# Patient Record
Sex: Male | Born: 1940 | Race: White | Hispanic: No | Marital: Married | Smoking: Former smoker
Health system: Southern US, Community
[De-identification: ages and names within clinical notes are randomized; demographics above are authoritative.]

## PROBLEM LIST (undated history)

## (undated) DIAGNOSIS — I10 Essential (primary) hypertension: Secondary | ICD-10-CM

## (undated) DIAGNOSIS — R0609 Other forms of dyspnea: Secondary | ICD-10-CM

## (undated) DIAGNOSIS — M8580 Other specified disorders of bone density and structure, unspecified site: Secondary | ICD-10-CM

## (undated) DIAGNOSIS — E039 Hypothyroidism, unspecified: Secondary | ICD-10-CM

## (undated) DIAGNOSIS — H409 Unspecified glaucoma: Secondary | ICD-10-CM

## (undated) DIAGNOSIS — M109 Gout, unspecified: Secondary | ICD-10-CM

## (undated) DIAGNOSIS — R0600 Dyspnea, unspecified: Secondary | ICD-10-CM

## (undated) DIAGNOSIS — K219 Gastro-esophageal reflux disease without esophagitis: Secondary | ICD-10-CM

## (undated) DIAGNOSIS — F32A Depression, unspecified: Secondary | ICD-10-CM

## (undated) DIAGNOSIS — N529 Male erectile dysfunction, unspecified: Secondary | ICD-10-CM

## (undated) DIAGNOSIS — J439 Emphysema, unspecified: Secondary | ICD-10-CM

## (undated) DIAGNOSIS — I440 Atrioventricular block, first degree: Secondary | ICD-10-CM

## (undated) DIAGNOSIS — H3530 Unspecified macular degeneration: Secondary | ICD-10-CM

## (undated) DIAGNOSIS — F419 Anxiety disorder, unspecified: Secondary | ICD-10-CM

## (undated) DIAGNOSIS — G629 Polyneuropathy, unspecified: Secondary | ICD-10-CM

## (undated) DIAGNOSIS — G2581 Restless legs syndrome: Secondary | ICD-10-CM

## (undated) DIAGNOSIS — N62 Hypertrophy of breast: Secondary | ICD-10-CM

## (undated) DIAGNOSIS — I2510 Atherosclerotic heart disease of native coronary artery without angina pectoris: Secondary | ICD-10-CM

## (undated) DIAGNOSIS — D696 Thrombocytopenia, unspecified: Secondary | ICD-10-CM

## (undated) DIAGNOSIS — F329 Major depressive disorder, single episode, unspecified: Secondary | ICD-10-CM

## (undated) DIAGNOSIS — F1011 Alcohol abuse, in remission: Secondary | ICD-10-CM

## (undated) DIAGNOSIS — J449 Chronic obstructive pulmonary disease, unspecified: Secondary | ICD-10-CM

## (undated) DIAGNOSIS — E785 Hyperlipidemia, unspecified: Secondary | ICD-10-CM

## (undated) DIAGNOSIS — G4733 Obstructive sleep apnea (adult) (pediatric): Secondary | ICD-10-CM

## (undated) DIAGNOSIS — N400 Enlarged prostate without lower urinary tract symptoms: Secondary | ICD-10-CM

## (undated) HISTORY — DX: Dyspnea, unspecified: R06.00

## (undated) HISTORY — DX: Atrioventricular block, first degree: I44.0

## (undated) HISTORY — DX: Restless legs syndrome: G25.81

## (undated) HISTORY — DX: Benign prostatic hyperplasia without lower urinary tract symptoms: N40.0

## (undated) HISTORY — DX: Hyperlipidemia, unspecified: E78.5

## (undated) HISTORY — DX: Atherosclerotic heart disease of native coronary artery without angina pectoris: I25.10

## (undated) HISTORY — DX: Alcohol abuse, in remission: F10.11

## (undated) HISTORY — PX: ORIF FEMUR FRACTURE: SHX2119

## (undated) HISTORY — DX: Major depressive disorder, single episode, unspecified: F32.9

## (undated) HISTORY — DX: Gastro-esophageal reflux disease without esophagitis: K21.9

## (undated) HISTORY — DX: Hypertrophy of breast: N62

## (undated) HISTORY — DX: Thrombocytopenia, unspecified: D69.6

## (undated) HISTORY — DX: Unspecified macular degeneration: H35.30

## (undated) HISTORY — DX: Hypothyroidism, unspecified: E03.9

## (undated) HISTORY — DX: Essential (primary) hypertension: I10

## (undated) HISTORY — DX: Chronic obstructive pulmonary disease, unspecified: J44.9

## (undated) HISTORY — DX: Male erectile dysfunction, unspecified: N52.9

## (undated) HISTORY — DX: Obstructive sleep apnea (adult) (pediatric): G47.33

## (undated) HISTORY — DX: Polyneuropathy, unspecified: G62.9

## (undated) HISTORY — DX: Emphysema, unspecified: J43.9

## (undated) HISTORY — DX: Depression, unspecified: F32.A

## (undated) HISTORY — DX: Unspecified glaucoma: H40.9

## (undated) HISTORY — DX: Other forms of dyspnea: R06.09

## (undated) HISTORY — DX: Hemochromatosis, unspecified: E83.119

## (undated) HISTORY — DX: Anxiety disorder, unspecified: F41.9

## (undated) HISTORY — DX: Other specified disorders of bone density and structure, unspecified site: M85.80

## (undated) HISTORY — DX: Gout, unspecified: M10.9

---

## 1998-01-15 ENCOUNTER — Encounter: Payer: Self-pay | Admitting: Cardiology

## 1998-01-15 ENCOUNTER — Ambulatory Visit (HOSPITAL_COMMUNITY): Admission: RE | Admit: 1998-01-15 | Discharge: 1998-01-15 | Payer: Self-pay | Admitting: Cardiology

## 1998-01-26 ENCOUNTER — Ambulatory Visit (HOSPITAL_COMMUNITY): Admission: RE | Admit: 1998-01-26 | Discharge: 1998-01-26 | Payer: Self-pay | Admitting: Cardiology

## 2000-01-17 ENCOUNTER — Ambulatory Visit (HOSPITAL_COMMUNITY): Admission: RE | Admit: 2000-01-17 | Discharge: 2000-01-17 | Payer: Self-pay | Admitting: Gastroenterology

## 2001-01-04 ENCOUNTER — Encounter (INDEPENDENT_AMBULATORY_CARE_PROVIDER_SITE_OTHER): Payer: Self-pay | Admitting: *Deleted

## 2001-01-04 ENCOUNTER — Ambulatory Visit (HOSPITAL_BASED_OUTPATIENT_CLINIC_OR_DEPARTMENT_OTHER): Admission: RE | Admit: 2001-01-04 | Discharge: 2001-01-04 | Payer: Self-pay | Admitting: Orthopedic Surgery

## 2001-02-26 ENCOUNTER — Ambulatory Visit (HOSPITAL_COMMUNITY): Admission: RE | Admit: 2001-02-26 | Discharge: 2001-02-26 | Payer: Self-pay | Admitting: Gastroenterology

## 2001-02-26 ENCOUNTER — Encounter (INDEPENDENT_AMBULATORY_CARE_PROVIDER_SITE_OTHER): Payer: Self-pay | Admitting: Specialist

## 2002-02-03 ENCOUNTER — Ambulatory Visit (HOSPITAL_COMMUNITY): Admission: RE | Admit: 2002-02-03 | Discharge: 2002-02-03 | Payer: Self-pay | Admitting: Gastroenterology

## 2002-02-03 ENCOUNTER — Encounter: Payer: Self-pay | Admitting: Gastroenterology

## 2002-02-28 ENCOUNTER — Encounter: Payer: Self-pay | Admitting: Emergency Medicine

## 2002-02-28 ENCOUNTER — Emergency Department (HOSPITAL_COMMUNITY): Admission: EM | Admit: 2002-02-28 | Discharge: 2002-02-28 | Payer: Self-pay | Admitting: Emergency Medicine

## 2002-04-14 ENCOUNTER — Emergency Department (HOSPITAL_COMMUNITY): Admission: EM | Admit: 2002-04-14 | Discharge: 2002-04-14 | Payer: Self-pay | Admitting: Emergency Medicine

## 2002-04-14 ENCOUNTER — Encounter: Payer: Self-pay | Admitting: Emergency Medicine

## 2005-09-27 ENCOUNTER — Encounter: Payer: Self-pay | Admitting: Cardiology

## 2006-05-30 ENCOUNTER — Emergency Department (HOSPITAL_COMMUNITY): Admission: EM | Admit: 2006-05-30 | Discharge: 2006-05-30 | Payer: Self-pay | Admitting: Emergency Medicine

## 2006-05-30 ENCOUNTER — Ambulatory Visit: Payer: Self-pay | Admitting: Internal Medicine

## 2006-06-05 ENCOUNTER — Encounter: Payer: Self-pay | Admitting: Cardiology

## 2006-06-05 ENCOUNTER — Ambulatory Visit: Payer: Self-pay

## 2007-07-10 ENCOUNTER — Encounter: Admission: RE | Admit: 2007-07-10 | Discharge: 2007-07-10 | Payer: Self-pay | Admitting: Endocrinology

## 2007-07-26 ENCOUNTER — Encounter (INDEPENDENT_AMBULATORY_CARE_PROVIDER_SITE_OTHER): Payer: Self-pay | Admitting: Otolaryngology

## 2007-07-26 ENCOUNTER — Ambulatory Visit (HOSPITAL_COMMUNITY): Admission: RE | Admit: 2007-07-26 | Discharge: 2007-07-26 | Payer: Self-pay | Admitting: Otolaryngology

## 2008-02-13 ENCOUNTER — Encounter (INDEPENDENT_AMBULATORY_CARE_PROVIDER_SITE_OTHER): Payer: Self-pay | Admitting: Otolaryngology

## 2008-02-13 ENCOUNTER — Ambulatory Visit (HOSPITAL_COMMUNITY): Admission: RE | Admit: 2008-02-13 | Discharge: 2008-02-13 | Payer: Self-pay | Admitting: Otolaryngology

## 2008-03-27 HISTORY — PX: CARDIAC CATHETERIZATION: SHX172

## 2008-07-18 ENCOUNTER — Emergency Department (HOSPITAL_COMMUNITY): Admission: EM | Admit: 2008-07-18 | Discharge: 2008-07-18 | Payer: Self-pay | Admitting: Emergency Medicine

## 2008-08-31 ENCOUNTER — Emergency Department (HOSPITAL_COMMUNITY): Admission: EM | Admit: 2008-08-31 | Discharge: 2008-08-31 | Payer: Self-pay | Admitting: Emergency Medicine

## 2009-01-27 ENCOUNTER — Ambulatory Visit: Payer: Self-pay | Admitting: Oncology

## 2009-01-29 LAB — CHCC SMEAR

## 2009-01-29 LAB — CBC WITH DIFFERENTIAL/PLATELET
BASO%: 0.5 % (ref 0.0–2.0)
Basophils Absolute: 0 10*3/uL (ref 0.0–0.1)
EOS%: 1.2 % (ref 0.0–7.0)
Eosinophils Absolute: 0.1 10*3/uL (ref 0.0–0.5)
HCT: 44.2 % (ref 38.4–49.9)
HGB: 15.5 g/dL (ref 13.0–17.1)
LYMPH%: 17.4 % (ref 14.0–49.0)
MCH: 34.8 pg — ABNORMAL HIGH (ref 27.2–33.4)
MCHC: 35 g/dL (ref 32.0–36.0)
MCV: 99.2 fL — ABNORMAL HIGH (ref 79.3–98.0)
MONO#: 0.3 10*3/uL (ref 0.1–0.9)
MONO%: 5.1 % (ref 0.0–14.0)
NEUT#: 3.7 10*3/uL (ref 1.5–6.5)
NEUT%: 75.8 % — ABNORMAL HIGH (ref 39.0–75.0)
Platelets: 84 10*3/uL — ABNORMAL LOW (ref 140–400)
RBC: 4.46 10*6/uL (ref 4.20–5.82)
RDW: 14.4 % (ref 11.0–14.6)
WBC: 4.9 10*3/uL (ref 4.0–10.3)
lymph#: 0.9 10*3/uL (ref 0.9–3.3)

## 2009-02-03 LAB — RHEUMATOID FACTOR: Rhuematoid fact SerPl-aCnc: 79 IU/mL — ABNORMAL HIGH (ref 0–20)

## 2009-02-03 LAB — APTT: aPTT: 26 seconds (ref 24–37)

## 2009-02-03 LAB — COMPREHENSIVE METABOLIC PANEL
ALT: 25 U/L (ref 0–53)
AST: 34 U/L (ref 0–37)
Albumin: 4.4 g/dL (ref 3.5–5.2)
Alkaline Phosphatase: 115 U/L (ref 39–117)
BUN: 23 mg/dL (ref 6–23)
CO2: 21 mEq/L (ref 19–32)
Calcium: 9.3 mg/dL (ref 8.4–10.5)
Chloride: 104 mEq/L (ref 96–112)
Creatinine, Ser: 1.36 mg/dL (ref 0.40–1.50)
Glucose, Bld: 105 mg/dL — ABNORMAL HIGH (ref 70–99)
Potassium: 4 mEq/L (ref 3.5–5.3)
Sodium: 139 mEq/L (ref 135–145)
Total Bilirubin: 1.5 mg/dL — ABNORMAL HIGH (ref 0.3–1.2)
Total Protein: 7.1 g/dL (ref 6.0–8.3)

## 2009-02-03 LAB — ANA: Anti Nuclear Antibody(ANA): NEGATIVE

## 2009-02-03 LAB — PROTHROMBIN TIME
INR: 1.1 (ref 0.0–1.5)
Prothrombin Time: 14.2 seconds (ref 11.6–15.2)

## 2009-02-03 LAB — LACTATE DEHYDROGENASE: LDH: 141 U/L (ref 94–250)

## 2009-02-10 ENCOUNTER — Ambulatory Visit (HOSPITAL_COMMUNITY): Admission: RE | Admit: 2009-02-10 | Discharge: 2009-02-10 | Payer: Self-pay | Admitting: Oncology

## 2009-03-02 ENCOUNTER — Ambulatory Visit: Payer: Self-pay | Admitting: Oncology

## 2009-03-03 LAB — CBC WITH DIFFERENTIAL/PLATELET
BASO%: 0.3 % (ref 0.0–2.0)
Basophils Absolute: 0 10*3/uL (ref 0.0–0.1)
EOS%: 1 % (ref 0.0–7.0)
Eosinophils Absolute: 0.1 10*3/uL (ref 0.0–0.5)
HCT: 43.3 % (ref 38.4–49.9)
HGB: 15.2 g/dL (ref 13.0–17.1)
LYMPH%: 22.5 % (ref 14.0–49.0)
MCH: 35.3 pg — ABNORMAL HIGH (ref 27.2–33.4)
MCHC: 35.1 g/dL (ref 32.0–36.0)
MCV: 100.4 fL — ABNORMAL HIGH (ref 79.3–98.0)
MONO#: 0.5 10*3/uL (ref 0.1–0.9)
MONO%: 8.2 % (ref 0.0–14.0)
NEUT#: 4 10*3/uL (ref 1.5–6.5)
NEUT%: 68 % (ref 39.0–75.0)
Platelets: 79 10*3/uL — ABNORMAL LOW (ref 140–400)
RBC: 4.32 10*6/uL (ref 4.20–5.82)
RDW: 13.8 % (ref 11.0–14.6)
WBC: 5.9 10*3/uL (ref 4.0–10.3)
lymph#: 1.3 10*3/uL (ref 0.9–3.3)

## 2009-03-31 ENCOUNTER — Emergency Department (HOSPITAL_COMMUNITY): Admission: EM | Admit: 2009-03-31 | Discharge: 2009-03-31 | Payer: Self-pay | Admitting: Emergency Medicine

## 2009-04-02 ENCOUNTER — Encounter: Payer: Self-pay | Admitting: Emergency Medicine

## 2009-04-02 ENCOUNTER — Inpatient Hospital Stay (HOSPITAL_COMMUNITY): Admission: EM | Admit: 2009-04-02 | Discharge: 2009-04-05 | Payer: Self-pay | Admitting: Psychiatry

## 2009-04-02 ENCOUNTER — Ambulatory Visit: Payer: Self-pay | Admitting: Psychiatry

## 2009-04-27 ENCOUNTER — Ambulatory Visit: Payer: Self-pay | Admitting: Oncology

## 2009-04-29 ENCOUNTER — Ambulatory Visit (HOSPITAL_COMMUNITY): Admission: RE | Admit: 2009-04-29 | Discharge: 2009-04-29 | Payer: Self-pay | Admitting: Gastroenterology

## 2009-06-02 ENCOUNTER — Ambulatory Visit: Payer: Self-pay | Admitting: Oncology

## 2009-06-04 LAB — CBC WITH DIFFERENTIAL/PLATELET
BASO%: 0.5 % (ref 0.0–2.0)
Basophils Absolute: 0 10*3/uL (ref 0.0–0.1)
EOS%: 1 % (ref 0.0–7.0)
Eosinophils Absolute: 0.1 10*3/uL (ref 0.0–0.5)
HCT: 42.1 % (ref 38.4–49.9)
HGB: 14.6 g/dL (ref 13.0–17.1)
LYMPH%: 17.6 % (ref 14.0–49.0)
MCH: 34.4 pg — ABNORMAL HIGH (ref 27.2–33.4)
MCHC: 34.7 g/dL (ref 32.0–36.0)
MCV: 99.1 fL — ABNORMAL HIGH (ref 79.3–98.0)
MONO#: 0.4 10*3/uL (ref 0.1–0.9)
MONO%: 6.1 % (ref 0.0–14.0)
NEUT#: 5.1 10*3/uL (ref 1.5–6.5)
NEUT%: 74.8 % (ref 39.0–75.0)
Platelets: 102 10*3/uL — ABNORMAL LOW (ref 140–400)
RBC: 4.25 10*6/uL (ref 4.20–5.82)
RDW: 13.7 % (ref 11.0–14.6)
WBC: 6.8 10*3/uL (ref 4.0–10.3)
lymph#: 1.2 10*3/uL (ref 0.9–3.3)

## 2009-06-07 ENCOUNTER — Ambulatory Visit: Payer: Self-pay | Admitting: Psychology

## 2009-06-07 LAB — COMPREHENSIVE METABOLIC PANEL
ALT: 18 U/L (ref 0–53)
AST: 25 U/L (ref 0–37)
Albumin: 4.2 g/dL (ref 3.5–5.2)
Alkaline Phosphatase: 96 U/L (ref 39–117)
BUN: 14 mg/dL (ref 6–23)
CO2: 25 mEq/L (ref 19–32)
Calcium: 9.1 mg/dL (ref 8.4–10.5)
Chloride: 101 mEq/L (ref 96–112)
Creatinine, Ser: 1.21 mg/dL (ref 0.40–1.50)
Glucose, Bld: 70 mg/dL (ref 70–99)
Potassium: 3.8 mEq/L (ref 3.5–5.3)
Sodium: 138 mEq/L (ref 135–145)
Total Bilirubin: 1.4 mg/dL — ABNORMAL HIGH (ref 0.3–1.2)
Total Protein: 6.5 g/dL (ref 6.0–8.3)

## 2009-06-07 LAB — LACTATE DEHYDROGENASE: LDH: 134 U/L (ref 94–250)

## 2009-06-07 LAB — VITAMIN B12: Vitamin B-12: 412 pg/mL (ref 211–911)

## 2009-06-07 LAB — FOLATE RBC: RBC Folate: 1886 ng/mL — ABNORMAL HIGH (ref 180–600)

## 2009-06-21 ENCOUNTER — Ambulatory Visit: Payer: Self-pay | Admitting: Psychology

## 2009-06-24 LAB — CBC WITH DIFFERENTIAL/PLATELET
BASO%: 0.5 % (ref 0.0–2.0)
Basophils Absolute: 0 10*3/uL (ref 0.0–0.1)
EOS%: 0.7 % (ref 0.0–7.0)
Eosinophils Absolute: 0.1 10*3/uL (ref 0.0–0.5)
HCT: 44.2 % (ref 38.4–49.9)
HGB: 15.4 g/dL (ref 13.0–17.1)
LYMPH%: 23.6 % (ref 14.0–49.0)
MCH: 34.3 pg — ABNORMAL HIGH (ref 27.2–33.4)
MCHC: 34.8 g/dL (ref 32.0–36.0)
MCV: 98.8 fL — ABNORMAL HIGH (ref 79.3–98.0)
MONO#: 0.5 10*3/uL (ref 0.1–0.9)
MONO%: 6.8 % (ref 0.0–14.0)
NEUT#: 5.1 10*3/uL (ref 1.5–6.5)
NEUT%: 68.4 % (ref 39.0–75.0)
Platelets: 100 10*3/uL — ABNORMAL LOW (ref 140–400)
RBC: 4.47 10*6/uL (ref 4.20–5.82)
RDW: 14.3 % (ref 11.0–14.6)
WBC: 7.5 10*3/uL (ref 4.0–10.3)
lymph#: 1.8 10*3/uL (ref 0.9–3.3)

## 2009-06-28 ENCOUNTER — Ambulatory Visit: Payer: Self-pay | Admitting: Psychology

## 2009-07-09 ENCOUNTER — Ambulatory Visit: Payer: Self-pay | Admitting: Psychology

## 2009-07-26 ENCOUNTER — Ambulatory Visit: Payer: Self-pay | Admitting: Psychology

## 2009-08-02 ENCOUNTER — Ambulatory Visit: Payer: Self-pay | Admitting: Psychology

## 2009-08-23 ENCOUNTER — Ambulatory Visit: Payer: Self-pay | Admitting: Psychology

## 2009-08-30 ENCOUNTER — Ambulatory Visit: Payer: Self-pay | Admitting: Psychology

## 2009-09-06 ENCOUNTER — Ambulatory Visit: Payer: Self-pay | Admitting: Psychology

## 2009-10-04 ENCOUNTER — Ambulatory Visit: Payer: Self-pay | Admitting: Psychology

## 2009-10-11 ENCOUNTER — Ambulatory Visit: Payer: Self-pay | Admitting: Psychology

## 2009-10-13 ENCOUNTER — Ambulatory Visit (HOSPITAL_COMMUNITY): Admission: RE | Admit: 2009-10-13 | Discharge: 2009-10-13 | Payer: Self-pay | Admitting: Psychiatry

## 2009-10-18 ENCOUNTER — Ambulatory Visit: Payer: Self-pay | Admitting: Psychology

## 2009-10-26 ENCOUNTER — Ambulatory Visit: Payer: Self-pay | Admitting: Psychology

## 2009-11-01 ENCOUNTER — Ambulatory Visit: Payer: Self-pay | Admitting: Psychology

## 2009-11-08 ENCOUNTER — Ambulatory Visit: Payer: Self-pay | Admitting: Psychology

## 2009-11-15 ENCOUNTER — Ambulatory Visit: Payer: Self-pay | Admitting: Psychology

## 2009-11-30 ENCOUNTER — Ambulatory Visit: Payer: Self-pay | Admitting: Oncology

## 2009-11-30 ENCOUNTER — Ambulatory Visit: Payer: Self-pay | Admitting: Psychology

## 2009-12-06 LAB — CBC WITH DIFFERENTIAL/PLATELET
BASO%: 0.1 % (ref 0.0–2.0)
Basophils Absolute: 0 10*3/uL (ref 0.0–0.1)
EOS%: 1.2 % (ref 0.0–7.0)
Eosinophils Absolute: 0.1 10*3/uL (ref 0.0–0.5)
HCT: 42 % (ref 38.4–49.9)
HGB: 14.4 g/dL (ref 13.0–17.1)
LYMPH%: 20.7 % (ref 14.0–49.0)
MCH: 32.8 pg (ref 27.2–33.4)
MCHC: 34.4 g/dL (ref 32.0–36.0)
MCV: 95.4 fL (ref 79.3–98.0)
MONO#: 0.3 10*3/uL (ref 0.1–0.9)
MONO%: 4.1 % (ref 0.0–14.0)
NEUT#: 6 10*3/uL (ref 1.5–6.5)
NEUT%: 73.9 % (ref 39.0–75.0)
Platelets: 133 10*3/uL — ABNORMAL LOW (ref 140–400)
RBC: 4.4 10*6/uL (ref 4.20–5.82)
RDW: 12.8 % (ref 11.0–14.6)
WBC: 8.2 10*3/uL (ref 4.0–10.3)
lymph#: 1.7 10*3/uL (ref 0.9–3.3)

## 2009-12-06 LAB — COMPREHENSIVE METABOLIC PANEL
ALT: 9 U/L (ref 0–53)
AST: 17 U/L (ref 0–37)
Albumin: 4.5 g/dL (ref 3.5–5.2)
Alkaline Phosphatase: 87 U/L (ref 39–117)
BUN: 23 mg/dL (ref 6–23)
CO2: 20 mEq/L (ref 19–32)
Calcium: 9.8 mg/dL (ref 8.4–10.5)
Chloride: 103 mEq/L (ref 96–112)
Creatinine, Ser: 1.33 mg/dL (ref 0.40–1.50)
Glucose, Bld: 90 mg/dL (ref 70–99)
Potassium: 4.5 mEq/L (ref 3.5–5.3)
Sodium: 137 mEq/L (ref 135–145)
Total Bilirubin: 0.9 mg/dL (ref 0.3–1.2)
Total Protein: 7.4 g/dL (ref 6.0–8.3)

## 2009-12-06 LAB — LACTATE DEHYDROGENASE: LDH: 112 U/L (ref 94–250)

## 2009-12-13 ENCOUNTER — Ambulatory Visit: Payer: Self-pay | Admitting: Psychology

## 2009-12-20 ENCOUNTER — Ambulatory Visit: Payer: Self-pay | Admitting: Psychology

## 2009-12-27 ENCOUNTER — Ambulatory Visit: Payer: Self-pay | Admitting: Psychology

## 2010-01-03 ENCOUNTER — Ambulatory Visit: Payer: Self-pay | Admitting: Psychology

## 2010-01-17 ENCOUNTER — Ambulatory Visit: Payer: Self-pay | Admitting: Psychology

## 2010-01-24 ENCOUNTER — Ambulatory Visit: Payer: Self-pay | Admitting: Psychology

## 2010-02-01 ENCOUNTER — Ambulatory Visit: Payer: Self-pay | Admitting: Psychology

## 2010-02-07 ENCOUNTER — Ambulatory Visit: Payer: Self-pay | Admitting: Psychology

## 2010-02-14 ENCOUNTER — Ambulatory Visit: Payer: Self-pay | Admitting: Psychology

## 2010-02-21 ENCOUNTER — Ambulatory Visit: Payer: Self-pay | Admitting: Psychology

## 2010-02-28 ENCOUNTER — Ambulatory Visit: Payer: Self-pay | Admitting: Psychology

## 2010-03-14 ENCOUNTER — Ambulatory Visit: Payer: Self-pay | Admitting: Psychology

## 2010-03-21 ENCOUNTER — Ambulatory Visit: Payer: Self-pay | Admitting: Psychology

## 2010-04-04 ENCOUNTER — Ambulatory Visit: Payer: Self-pay | Admitting: Psychology

## 2010-04-18 ENCOUNTER — Ambulatory Visit: Payer: Self-pay | Admitting: Psychology

## 2010-05-02 ENCOUNTER — Ambulatory Visit: Payer: Self-pay | Admitting: Psychology

## 2010-05-16 ENCOUNTER — Ambulatory Visit: Payer: Self-pay | Admitting: Psychology

## 2010-05-31 ENCOUNTER — Ambulatory Visit
Admission: RE | Admit: 2010-05-31 | Discharge: 2010-05-31 | Payer: Self-pay | Source: Home / Self Care | Attending: Psychology | Admitting: Psychology

## 2010-06-02 ENCOUNTER — Encounter
Admission: RE | Admit: 2010-06-02 | Discharge: 2010-06-02 | Payer: Self-pay | Source: Home / Self Care | Attending: Cardiovascular Disease | Admitting: Cardiovascular Disease

## 2010-06-03 ENCOUNTER — Ambulatory Visit (HOSPITAL_COMMUNITY)
Admission: RE | Admit: 2010-06-03 | Discharge: 2010-06-03 | Payer: Self-pay | Source: Home / Self Care | Attending: Cardiovascular Disease | Admitting: Cardiovascular Disease

## 2010-06-03 HISTORY — PX: CARDIAC CATHETERIZATION: SHX172

## 2010-06-03 LAB — BASIC METABOLIC PANEL
BUN: 32 mg/dL — ABNORMAL HIGH (ref 6–23)
CO2: 25 mEq/L (ref 19–32)
Calcium: 9.4 mg/dL (ref 8.4–10.5)
Chloride: 106 mEq/L (ref 96–112)
Creatinine, Ser: 1.8 mg/dL — ABNORMAL HIGH (ref 0.4–1.5)
GFR calc Af Amer: 45 mL/min — ABNORMAL LOW (ref >60)
GFR calc non Af Amer: 37 mL/min — ABNORMAL LOW (ref >60)
Glucose, Bld: 88 mg/dL (ref 70–99)
Potassium: 4.4 mEq/L (ref 3.5–5.1)
Sodium: 141 mEq/L (ref 135–145)

## 2010-06-03 LAB — CBC
HCT: 39.6 % (ref 39.0–52.0)
Hemoglobin: 13.5 g/dL (ref 13.0–17.0)
MCH: 31.9 pg (ref 26.0–34.0)
MCHC: 34.1 g/dL (ref 30.0–36.0)
MCV: 93.6 fL (ref 78.0–100.0)
Platelets: 103 10*3/uL — ABNORMAL LOW (ref 150–400)
RBC: 4.23 MIL/uL (ref 4.22–5.81)
RDW: 14.3 % (ref 11.5–15.5)
WBC: 7.2 10*3/uL (ref 4.0–10.5)

## 2010-06-03 LAB — PROTIME-INR
INR: 1.01 (ref 0.00–1.49)
Prothrombin Time: 13.5 seconds (ref 11.6–15.2)

## 2010-06-03 LAB — APTT: aPTT: 29 seconds (ref 24–37)

## 2010-06-13 ENCOUNTER — Ambulatory Visit
Admission: RE | Admit: 2010-06-13 | Discharge: 2010-06-13 | Payer: Self-pay | Source: Home / Self Care | Attending: Psychology | Admitting: Psychology

## 2010-06-13 LAB — POCT I-STAT, CHEM 8
BUN: 32 mg/dL — ABNORMAL HIGH (ref 6–23)
Calcium, Ion: 1.21 mmol/L (ref 1.12–1.32)
Chloride: 109 mEq/L (ref 96–112)
Creatinine, Ser: 1.6 mg/dL — ABNORMAL HIGH (ref 0.4–1.5)
Glucose, Bld: 150 mg/dL — ABNORMAL HIGH (ref 70–99)
HCT: 35 % — ABNORMAL LOW (ref 39.0–52.0)
Hemoglobin: 11.9 g/dL — ABNORMAL LOW (ref 13.0–17.0)
Potassium: 4.5 mEq/L (ref 3.5–5.1)
Sodium: 140 mEq/L (ref 135–145)
TCO2: 22 mmol/L (ref 0–100)

## 2010-06-20 ENCOUNTER — Encounter (HOSPITAL_COMMUNITY): Payer: Self-pay | Admitting: Oncology

## 2010-06-27 ENCOUNTER — Ambulatory Visit: Admit: 2010-06-27 | Payer: Self-pay | Admitting: Psychology

## 2010-07-25 ENCOUNTER — Ambulatory Visit (INDEPENDENT_AMBULATORY_CARE_PROVIDER_SITE_OTHER): Payer: Medicare Other | Admitting: Psychology

## 2010-07-25 DIAGNOSIS — F411 Generalized anxiety disorder: Secondary | ICD-10-CM

## 2010-08-22 ENCOUNTER — Ambulatory Visit (INDEPENDENT_AMBULATORY_CARE_PROVIDER_SITE_OTHER): Payer: Medicare Other | Admitting: Psychology

## 2010-08-22 DIAGNOSIS — F411 Generalized anxiety disorder: Secondary | ICD-10-CM

## 2010-08-30 LAB — CBC
HCT: 38.7 % — ABNORMAL LOW (ref 39.0–52.0)
Hemoglobin: 13.5 g/dL (ref 13.0–17.0)
MCHC: 34.9 g/dL (ref 30.0–36.0)
MCV: 98.1 fL (ref 78.0–100.0)
Platelets: 78 10*3/uL — ABNORMAL LOW (ref 150–400)
RBC: 3.94 MIL/uL — ABNORMAL LOW (ref 4.22–5.81)
RDW: 13 % (ref 11.5–15.5)
WBC: 5.6 10*3/uL (ref 4.0–10.5)

## 2010-08-30 LAB — PROTIME-INR
INR: 1.12 (ref 0.00–1.49)
Prothrombin Time: 14.3 seconds (ref 11.6–15.2)

## 2010-08-30 LAB — APTT: aPTT: 31 seconds (ref 24–37)

## 2010-08-31 LAB — ETHANOL
Alcohol, Ethyl (B): 290 mg/dL — ABNORMAL HIGH (ref 0–10)
Alcohol, Ethyl (B): 60 mg/dL — ABNORMAL HIGH (ref 0–10)

## 2010-08-31 LAB — DIFFERENTIAL
Basophils Absolute: 0 10*3/uL (ref 0.0–0.1)
Basophils Absolute: 0.1 10*3/uL (ref 0.0–0.1)
Basophils Relative: 0 % (ref 0–1)
Basophils Relative: 1 % (ref 0–1)
Eosinophils Absolute: 0 10*3/uL (ref 0.0–0.7)
Eosinophils Absolute: 0 10*3/uL (ref 0.0–0.7)
Eosinophils Relative: 0 % (ref 0–5)
Eosinophils Relative: 1 % (ref 0–5)
Lymphocytes Relative: 18 % (ref 12–46)
Lymphocytes Relative: 19 % (ref 12–46)
Lymphs Abs: 1.2 10*3/uL (ref 0.7–4.0)
Lymphs Abs: 1.4 10*3/uL (ref 0.7–4.0)
Monocytes Absolute: 0.2 10*3/uL (ref 0.1–1.0)
Monocytes Absolute: 0.2 10*3/uL (ref 0.1–1.0)
Monocytes Relative: 3 % (ref 3–12)
Monocytes Relative: 4 % (ref 3–12)
Neutro Abs: 4.8 10*3/uL (ref 1.7–7.7)
Neutro Abs: 6.5 10*3/uL (ref 1.7–7.7)
Neutrophils Relative %: 76 % (ref 43–77)
Neutrophils Relative %: 79 % — ABNORMAL HIGH (ref 43–77)

## 2010-08-31 LAB — CBC
HCT: 48 % (ref 39.0–52.0)
HCT: 51.9 % (ref 39.0–52.0)
Hemoglobin: 16.6 g/dL (ref 13.0–17.0)
Hemoglobin: 17.7 g/dL — ABNORMAL HIGH (ref 13.0–17.0)
MCHC: 34.2 g/dL (ref 30.0–36.0)
MCHC: 34.7 g/dL (ref 30.0–36.0)
MCV: 101.9 fL — ABNORMAL HIGH (ref 78.0–100.0)
MCV: 102.2 fL — ABNORMAL HIGH (ref 78.0–100.0)
Platelets: 66 10*3/uL — ABNORMAL LOW (ref 150–400)
Platelets: 68 10*3/uL — ABNORMAL LOW (ref 150–400)
RBC: 4.72 MIL/uL (ref 4.22–5.81)
RBC: 5.08 MIL/uL (ref 4.22–5.81)
RDW: 12.8 % (ref 11.5–15.5)
RDW: 13.3 % (ref 11.5–15.5)
WBC: 6.3 10*3/uL (ref 4.0–10.5)
WBC: 8.1 10*3/uL (ref 4.0–10.5)

## 2010-08-31 LAB — COMPREHENSIVE METABOLIC PANEL
ALT: 44 U/L (ref 0–53)
ALT: 46 U/L (ref 0–53)
AST: 70 U/L — ABNORMAL HIGH (ref 0–37)
AST: 74 U/L — ABNORMAL HIGH (ref 0–37)
Albumin: 4.1 g/dL (ref 3.5–5.2)
Albumin: 4.3 g/dL (ref 3.5–5.2)
Alkaline Phosphatase: 110 U/L (ref 39–117)
Alkaline Phosphatase: 122 U/L — ABNORMAL HIGH (ref 39–117)
BUN: 27 mg/dL — ABNORMAL HIGH (ref 6–23)
BUN: 28 mg/dL — ABNORMAL HIGH (ref 6–23)
CO2: 13 mEq/L — ABNORMAL LOW (ref 19–32)
CO2: 20 mEq/L (ref 19–32)
Calcium: 8.9 mg/dL (ref 8.4–10.5)
Calcium: 9.3 mg/dL (ref 8.4–10.5)
Chloride: 106 mEq/L (ref 96–112)
Chloride: 107 mEq/L (ref 96–112)
Creatinine, Ser: 1.43 mg/dL (ref 0.4–1.5)
Creatinine, Ser: 1.49 mg/dL (ref 0.4–1.5)
GFR calc Af Amer: 57 mL/min — ABNORMAL LOW (ref >60)
GFR calc Af Amer: 60 mL/min — ABNORMAL LOW (ref >60)
GFR calc non Af Amer: 47 mL/min — ABNORMAL LOW (ref >60)
GFR calc non Af Amer: 49 mL/min — ABNORMAL LOW (ref >60)
Glucose, Bld: 108 mg/dL — ABNORMAL HIGH (ref 70–99)
Glucose, Bld: 76 mg/dL (ref 70–99)
Potassium: 4.3 mEq/L (ref 3.5–5.1)
Potassium: 5.2 mEq/L — ABNORMAL HIGH (ref 3.5–5.1)
Sodium: 139 mEq/L (ref 135–145)
Sodium: 141 mEq/L (ref 135–145)
Total Bilirubin: 1.7 mg/dL — ABNORMAL HIGH (ref 0.3–1.2)
Total Bilirubin: 1.9 mg/dL — ABNORMAL HIGH (ref 0.3–1.2)
Total Protein: 7.3 g/dL (ref 6.0–8.3)
Total Protein: 7.9 g/dL (ref 6.0–8.3)

## 2010-08-31 LAB — URINE MICROSCOPIC-ADD ON

## 2010-08-31 LAB — URINALYSIS, ROUTINE W REFLEX MICROSCOPIC
Bilirubin Urine: NEGATIVE
Bilirubin Urine: NEGATIVE
Glucose, UA: NEGATIVE mg/dL
Glucose, UA: NEGATIVE mg/dL
Ketones, ur: 15 mg/dL — AB
Leukocytes, UA: NEGATIVE
Leukocytes, UA: NEGATIVE
Nitrite: NEGATIVE
Nitrite: NEGATIVE
Protein, ur: 100 mg/dL — AB
Protein, ur: 30 mg/dL — AB
Specific Gravity, Urine: 1.023 (ref 1.005–1.030)
Specific Gravity, Urine: 1.023 (ref 1.005–1.030)
Urobilinogen, UA: 0.2 mg/dL (ref 0.0–1.0)
Urobilinogen, UA: 0.2 mg/dL (ref 0.0–1.0)
pH: 5.5 (ref 5.0–8.0)
pH: 5.5 (ref 5.0–8.0)

## 2010-08-31 LAB — RAPID URINE DRUG SCREEN, HOSP PERFORMED
Amphetamines: NOT DETECTED
Amphetamines: NOT DETECTED
Barbiturates: NOT DETECTED
Barbiturates: NOT DETECTED
Benzodiazepines: POSITIVE — AB
Benzodiazepines: POSITIVE — AB
Cocaine: NOT DETECTED
Cocaine: NOT DETECTED
Opiates: NOT DETECTED
Opiates: NOT DETECTED
Tetrahydrocannabinol: NOT DETECTED
Tetrahydrocannabinol: NOT DETECTED

## 2010-09-07 LAB — DIFFERENTIAL
Basophils Absolute: 0 10*3/uL (ref 0.0–0.1)
Basophils Relative: 0 % (ref 0–1)
Eosinophils Absolute: 0 10*3/uL (ref 0.0–0.7)
Eosinophils Relative: 0 % (ref 0–5)
Lymphocytes Relative: 8 % — ABNORMAL LOW (ref 12–46)
Lymphs Abs: 0.8 10*3/uL (ref 0.7–4.0)
Monocytes Absolute: 0.6 10*3/uL (ref 0.1–1.0)
Monocytes Relative: 6 % (ref 3–12)
Neutro Abs: 8.5 10*3/uL — ABNORMAL HIGH (ref 1.7–7.7)
Neutrophils Relative %: 86 % — ABNORMAL HIGH (ref 43–77)

## 2010-09-07 LAB — COMPREHENSIVE METABOLIC PANEL
ALT: 18 U/L (ref 0–53)
AST: 30 U/L (ref 0–37)
Albumin: 3.8 g/dL (ref 3.5–5.2)
Alkaline Phosphatase: 147 U/L — ABNORMAL HIGH (ref 39–117)
BUN: 25 mg/dL — ABNORMAL HIGH (ref 6–23)
CO2: 19 mEq/L (ref 19–32)
Calcium: 9.5 mg/dL (ref 8.4–10.5)
Chloride: 105 mEq/L (ref 96–112)
Creatinine, Ser: 1.29 mg/dL (ref 0.4–1.5)
GFR calc Af Amer: >60 mL/min (ref >60)
GFR calc non Af Amer: 55 mL/min — ABNORMAL LOW (ref >60)
Glucose, Bld: 263 mg/dL — ABNORMAL HIGH (ref 70–99)
Potassium: 4.2 mEq/L (ref 3.5–5.1)
Sodium: 140 mEq/L (ref 135–145)
Total Bilirubin: 1.7 mg/dL — ABNORMAL HIGH (ref 0.3–1.2)
Total Protein: 6.8 g/dL (ref 6.0–8.3)

## 2010-09-07 LAB — CBC
HCT: 45 % (ref 39.0–52.0)
Hemoglobin: 15.5 g/dL (ref 13.0–17.0)
MCHC: 34.4 g/dL (ref 30.0–36.0)
MCV: 99.1 fL (ref 78.0–100.0)
Platelets: 133 10*3/uL — ABNORMAL LOW (ref 150–400)
RBC: 4.54 MIL/uL (ref 4.22–5.81)
RDW: 14.6 % (ref 11.5–15.5)
WBC: 9.9 10*3/uL (ref 4.0–10.5)

## 2010-09-07 LAB — POCT CARDIAC MARKERS
CKMB, poc: <1 ng/mL — ABNORMAL LOW (ref 1.0–8.0)
CKMB, poc: <1 ng/mL — ABNORMAL LOW (ref 1.0–8.0)
Myoglobin, poc: 121 ng/mL (ref 12–200)
Myoglobin, poc: 87.6 ng/mL (ref 12–200)
Troponin i, poc: <0.05 ng/mL (ref 0.00–0.09)
Troponin i, poc: <0.05 ng/mL (ref 0.00–0.09)

## 2010-09-07 LAB — D-DIMER, QUANTITATIVE: D-Dimer, Quant: 1.15 ug/mL-FEU — ABNORMAL HIGH (ref 0.00–0.48)

## 2010-09-07 LAB — ETHANOL: Alcohol, Ethyl (B): 16 mg/dL — ABNORMAL HIGH (ref 0–10)

## 2010-09-19 ENCOUNTER — Ambulatory Visit (INDEPENDENT_AMBULATORY_CARE_PROVIDER_SITE_OTHER): Payer: Medicare Other | Admitting: Psychology

## 2010-09-19 DIAGNOSIS — F411 Generalized anxiety disorder: Secondary | ICD-10-CM

## 2010-10-11 NOTE — Op Note (Signed)
NAMETANISH, PRIEN               ACCOUNT NO.:  [id number]   MEDICAL RECORD NO.:  [id number]          PATIENT TYPE:  AMB   LOCATION:  SDS                          FACILITY:  [facility]   PHYSICIAN:  Kinnie Scales. Annalee Genta, M.D.DATE OF BIRTH:  1940/06/14   DATE OF PROCEDURE:  02/13/2008  DATE OF DISCHARGE:                               OPERATIVE REPORT   PREOPERATIVE DIAGNOSES:  1. Right vocal cord lesion.  2. Chronic hoarseness.  3. History tobacco abuse.  4. Status post previous biopsy.   POSTOPERATIVE DIAGNOSES:  1. Right vocal cord lesion.  2. Chronic hoarseness.  3. History tobacco abuse.  4. Status post previous biopsy.   INDICATION FOR SURGERY:  1. Right vocal cord lesion.  2. Chronic hoarseness.  3. History tobacco abuse.  4. Status post previous biopsy.   SURGICAL PROCEDURE:  1. Direct laryngoscopy.  2. Microlaryngoscopy.  3. Right vocal cord stripping.   SURGEON:  Kinnie Scales. Annalee Genta, MD   ANESTHESIA:  General endotracheal.   COMPLICATIONS:  None.   ESTIMATED BLOOD LOSS:  Minimal.   SPECIMEN:  Sent to pathology for microscopic evaluation.   BRIEF HISTORY:  The patient is a 70 year old white male previous smoker  who has been followed in our office over the last several years with  chronic hoarseness.  He was evaluated and found to have an exophytic  lesion involving the right vocal cord.  He underwent biopsy  approximately 6 months ago, which showed dysplastic changes but no  evidence of carcinoma.  The patient has continued with medical therapy  and reflex treatment and has followed up on an every other month basis.  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  Given his history and physical findings, I  recommended that we repeat microlaryngoscopy and biopsy of the right  vocal cord.  The risks, benefits, and possible complications of the  procedure were discussed in detail and the patient and his wife  understood and concurred with our plan for surgery.   PROCEDURE:  Mr. Bingman was brought to the operating room at [facility] OR on February 13, 2008, and placed in supine position on  the operating table.  General endotracheal anesthesia was established  without difficulty.  When the patient was adequately anesthetized, he  was positioned on the operating table and prepped and draped in a  sterile fashion.  A Dedo laryngoscope was then used to examine the oral  cavity, oropharynx, supraglottis, and hypopharynx.  There was no  evidence of mucosal mass, lesion, or ulcer.  The patient's larynx was  then examined and microlaryngoscopy suspension was then placed without  difficulty.  Microscope was moved into position and the patient's larynx  was examined.  On the left-hand side, he had a normal-appearing vocal  cord.  No evidence of mucosal lesion or change.  On the right-hand side,  he had a white exophytic lesion that extended from the anterior  commissure to the vocal process of the arytenoid, primarily on  the vocal  surface of the right true vocal cord.  There was minimal erythema and no  ulceration.  Given the finding, a right vocal cord stripping was  performed.  Using micro instruments, a posterior mucosal incision was  created at the free margin of the right vocal cord attachment to the  arytenoid.  The mucosa was then elevated using cup forceps and micro  scissors from posterior to anterior removing the overlying mucosa but  preserving the right true vocal cord tendon.  Dissection was carried to  the level of the anterior commissure and the leukoplakic region was  completely excised and sent to pathology for gross microscopic  evaluation.  The patient was suctioned and Adrenalin-soaked cottonoid  pledget was placed over the right vocal cord for hemostasis.  The  patient was reinspected.  There was no  bleeding.  Several small areas of  abnormal mucosa were biopsied along the inferior aspect of the previous  dissection, but there was no evidence of involvement of the laryngeal  ventricle or the tracheal surface of the vocal cord.  There was no  bleeding.  The laryngoscope was then removed and orogastric tube was  passed.  The stomach contents were aspirated.  The patient was then  awakened from his anesthetic.  He was extubated and was transferred from  the operating room to the recovery in stable condition.  No  complications.  Blood loss minimal.           ______________________________  Kinnie Scales. Annalee Genta, M.D.     DLS/MEDQ  D:  04/54/0981  T:  02/13/2008  Job:  [id number]

## 2010-10-11 NOTE — Op Note (Signed)
NAMEJAQUIS, PICKLESIMER               ACCOUNT NO.:  [id number]   MEDICAL RECORD NO.:  [id number]          PATIENT TYPE:  AMB   LOCATION:  SDS                          FACILITY:  [facility]   PHYSICIAN:  Kinnie Scales. Annalee Genta, M.D.DATE OF BIRTH:  [birth year]   DATE OF PROCEDURE:  07/26/2007  DATE OF DISCHARGE:                               OPERATIVE REPORT   PREOPERATIVE DIAGNOSES:  1. Chronic hoarseness.  2. Tobacco abuse.  3. Right vocal cord lesion.   POSTOPERATIVE DIAGNOSES:  1. Chronic hoarseness.  2. Tobacco abuse.  3. Right vocal cord lesion.   INDICATIONS FOR SURGERY:  1. Chronic hoarseness.  2. Tobacco abuse.  3. Right vocal cord lesion.   SURGICAL PROCEDURES:  1. Microlaryngoscopy.  2. Right vocal cord stripping/biopsy.   ANESTHESIA:  General endotracheal.   SURGEON:  Kinnie Scales. Annalee Genta, M.D.   COMPLICATIONS:  None.   ESTIMATED BLOOD LOSS:  Minimal.   The patient transferred from the operating room to the recovery room in  stable condition.   BRIEF HISTORY:  Timothy Spencer is a 70 year old white male who has been  followed with a history of chronic hoarseness.  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  He  was treated for gastroesophageal reflux and was counseled regarding  smoking cessation.  The patient was followed up several months later  with continued history of significant hoarseness and no significant  clinical change in findings of his vocal cord.  I recommended  microlaryngoscopy for biopsy and therapeutic vocal cord stripping.  The  risks, benefits and possible complications of the procedure were  discussed in detail with the patient, who understood and concurred with  our plan for surgery, which is scheduled for general anesthesia as an  outpatient at [facility] Main OR.   PROCEDURE:  The patient was brought into the operating room at [facility] OR, placed in supine position on the  operating table.  General endotracheal anesthesia was established without difficulty.  When the patient was adequately anesthetized, his oral cavity and  oropharynx were examined.  Base of tongue, supraglottis, larynx and  hypopharynx were all thoroughly examined.  No ulcer, mass, lesion or  tumor was noted.  The Dedo  laryngoscope was inserted to examine the  larynx and will microsuspension was undertaken.  With the suspension in  place, the operating microscope was moved into position with good  binocular visualization the entire larynx was examined.  The patient was  found to have leukoplakic changes along the entire right true vocal  cord.  Vocal cord mobility was intact.  There was no evidence of ulcer  or subglottic extension.  The vocal cord itself was erythematous and  somewhat swollen.  Right vocal cord stripping was then undertaken using  microscissors.  The mucosa of the anterior commissure was gently  dissected and dissection was then carried out along the right vocal cord  from anterior to posterior to the level of the vocal process.  The  entire medial aspect of the vocal cord, which corresponded to the  leukoplakic region, was gently removed using cup forceps and micro  scissors.  There was minimal bleeding.  A small adrenaline pledget was  placed overlying the area of surgery for several minutes to allow for  vasoconstriction.  The pledget was removed.  There was no active  bleeding.  The microsuspension was released and the Dedo laryngoscope  was removed.  There were no loose or broken teeth.  The patient's oral  cavity was again examined and no bleeding.  Orogastric tube was passed.  Stomach contents were aspirated.  The patient was awakened from his  anesthetic, extubated and was then transferred from the operating room  to the recovery room in stable condition, no complications and minimal  blood loss.           ______________________________  Kinnie Scales Annalee Genta,  M.D.     DLS/MEDQ  D:  71/95/2841  T:  07/27/2007  Job:  [id number]

## 2010-10-14 NOTE — Op Note (Signed)
[facility]  Patient:    Timothy Spencer, Timothy Spencer                      MRN: [id number] Proc. Date: 01/04/01 Adm. Date:  [id number] Attending:  Susa Day CC:         Katy Fitch. Sypher, Montez Hageman., M.D. (2 copies)   Operative Report  PREOPERATIVE DIAGNOSIS:  Aggressive Dupuytren palmar fibromatous, right hand involving the peritendinitis fibers to the ring and small fingers with extensive nodular disease extending over the proximal and middle phalangeal segments of the small finger.  POSTOPERATIVE DIAGNOSIS:  Aggressive Dupuytren palmar fibromatous, right hand involving the pretendinous fibers to the ring and small fingers with extensive nodular disease extending over the proximal and middle phalangeal segments of the small finger.  OPERATION:  Excision of Dupuytren contracture from right hand involving pretendinous fibers to the ring and small fingers and the palm and extensive nodular disease overlying the proximal and middle phalangeal segments of the small finger involving spiral bands, lateral fascial sheaths, natatory ligaments, and retrovascular cords.  SURGEON:  Katy Fitch. Sypher, Montez Hageman., M.D.  ASSISTANT:  Jonni Sanger, P.A.  ANESTHESIA:  General by LMA.  SUPERVISING ANESTHESIOLOGIST: Maren Beach, M.D.  INDICATIONS:  Timothy Spencer is a 70 year old gentleman who presented for evaluation of his hand with a two-year history of Dupuytren palmar fibromatosis.  He had a very significant MP flexion contracture and a very large nodular mass extending between the MP flexion crease and the PIP flexion crease of the small finger with a 50 degree flexion contracture of his small finger MP joint and a 30 degree flexion contracture of his small finger PIP joint.  He had extensive nodular disease in the palm extending to the proximal phalangeal segment of his ring finger that was primarily over the MP joint of the ring finger.  He  requested surgical treatment.  After informed consent, he was brought to the operating room at this time for excision of his pathologic palmar fibromatosis.  Prior to surgery, he was clearly advised as to the potential risks and benefits of surgery including infection, skin necrosis, neurovascular injury, and the possible development of reflex sympathetic dystrophy.  Additionally, he understands that Dupuytrens is a genetically driven process and will likely involve other portions of his right hand and left hand over time.  Despite these limitations in our ability to manage this predicament, he presents for surgery at this time with our primary goal relief of his flexion contractures and nodular palmar fibromatosis predicaments.  DESCRIPTION OF PROCEDURE:  Jayse Hodkinson is brought to the operating room and placed in supine position on the operating room table.  Following induction of general anesthesia by LMA, the right arm was prepped with Betadine soap and solution and sterilely draped.  Following exsanguination of the right arm with an Esmarch bandage, an arterial tourniquet on the right proximal brachium was inflated to 230 mmHg.  Procedure commenced with planning of an extensive Brunner zigzag incision from the DIP flexion crease of the small finger to the carpal tunnel region.  This was planned to allow exposure of the pretendinous fibers to the ring finger as well.  The skin incisions were then taken sharply followed by elevation of skin flaps by dissecting between the pathologic fascia and the deep surface of the dermis.  In the small finger over the proximal phalangeal segment, there are areas of dermis that were densely infiltrated with the  Dupuytren fibromatosis.  We anticipated removal of this skin by V-Y advancement flap and skin reduction at the time of closure.  The neurovascular bundles were identified at the level of the carpal tunnel and the canal beyond and  followed distally.  There was extensive involvement of the intermetacarpal septae that required careful protection of the neurovascular bundles and release with scissors.  The pretendinous nodules were carefully removed, relieving the flexion contracture of the metacarpophalangeal joint of the small finger.  Extensive dissection over the proximal phalangeal segment of the small finger involved removal of natatory ligaments to the ring finger, the lateral fascial sheaths, and a spiral band. There was a considerable amount of pretendinous involvement over the proximal phalangeal segment extending over the middle phalanx.  Excision of this pathologic fascia led to relief of the MP and PIP flexion contracture of the small finger.  Dissection was continued subcutaneously in the palm to expose the pretendinous fibers to the ring finger which was carefully isolated from the neurovascular bundles and resected.  All nodular disease on the deep surface of the dermis was removed with rongeurs.  Skin flaps were then tailored with removal of a considerable amount of pathologic skin over the proximal phalangeal segment of the small finger.  The skin quality was relatively poor at the level of the proximal finger flexion crease; however, this should heal with careful protection of the hand as a full-thickness skin graft.  The skin flaps were then inset with corner sutures of 5-0 nylon and interrupted sutures of 50 nylon.  Tourniquet was released, and bleeding was controlled by direct pressure for approximately 5 minutes.  Immediate capillary refill to the fingertips was noted following release of the arterial tourniquet.  The wound was then dressed with Silvadene, Xeroflo, sterile gauze, acrylic fluff, and a volar plaster splint maintaining the ring and small finger MP joints in full extension.  There were no apparent complications.  For aftercare, Mr. Harts is given a prescription for  Augmentin 875 mg 1 p.o. b.i.d. x 5 days as a prophylactic antibiotic and Percocet 5/325 one or two  tablets p.o. q.4-6h. p.r.n. pain, a total of 28 tablets without refill. D:  01/04/01 TD:  01/05/01 Job: [id number]

## 2010-10-14 NOTE — Consult Note (Signed)
NAMEDENNIES, COATE NO.:  [id number]   MEDICAL RECORD NO.:  [id number]          PATIENT TYPE:  EMS   LOCATION:  MAJO                         FACILITY:  [facility]   PHYSICIAN:  Pricilla Riffle, MD, FACCDATE OF BIRTH:  [birth year]   DATE OF CONSULTATION:  05/30/2005  DATE OF DISCHARGE:                                 CONSULTATION   IDENTIFICATION:  Mr. Daywalt is a 70 year old who we were asked to see  regarding chest pain.   HISTORY OF PRESENT ILLNESS:  The patient has no known history of  coronary artery disease.  He says about 60 days ago he had chest pain  that lasted about an hour or two.  A year ago, he had a similar episode,  eased off on its own.  Today, at about 2:30, he was sitting.  He had  eaten lunch a little earlier when he felt chest pressure, pain was a  pressure, substernal, non-pleuritic, non-positional, lasted about two  and a half to three hours, not accompanied by any shortness of breath,  moderate in intensity.  Was brought to the ER for further evaluation,  and soon after arriving it eased off.   The patient notes no change in his activity, so he is not that active.  No PND nor orthopnea.   Note he had GE reflux in the past.  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  He has not had any problems swallowing food, no emesis  which was his presentation with the stricture.   ALLERGIES:  None.   MEDICATIONS:  1. Synthroid.  2. Flomax.  3. Prozac.  4. Nicorette   PAST MEDICAL HISTORY:  1. 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  2. BPH.  3. History of tobacco use, currently back on Nicorette.  4. History of depression/anxiety on Prozac.   SOCIAL HISTORY:  The patient is married, lives with his wife, has  approximately 30-pack years history of smoking, quit a couple months ago  the second time, drinks occasionally.   FAMILY HISTORY:  Not available.  The patient was adopted.   REVIEW OF SYSTEMS:  No fevers, chills, no sweats.  HEENT:   Negative.  No  headaches, no visual changes.  SKIN:  Negative.  CARDIOPULMONARY:  As  noted.  No shortness of breath, no orthopnea, PND, no edema.  No  palpitations.  No presyncope, syncope.  Cholesterol was 200-215 with an  okay ratio.  GU:  Negative.  MUSCULOSKELETAL:  Negative.  GI:  As noted  above.  No recent symptoms.  Endocrine as noted above.  MUSCULOSKELETAL:  Negative.  Otherwise all systems reviewed and negative except all  problems as noted.   PHYSICAL EXAMINATION:  GENERAL:  The patient is in no acute distress.  VITAL SIGNS:  Heart rate 59, blood pressure 131/61, temperature of 98.  HEENT:  Normocephalic, atraumatic.  EOMI.  PERRL.  Sclerae are clear.  NECK:  No JVD.  No bruits, wears glasses.  LUNGS:  Clear to auscultation.  No real rales or wheezes.  CHEST:  Nontender.  CARDIAC:  Exam regular rate and  rhythm S1-S2.  No S3 or murmurs.  ABDOMEN:  Benign. No hepatosplenomegaly.  No masses.  Normal bowel  sounds.  EXTREMITIES:  Good distal pulses throughout.  No lower extremity edema.   STUDIES:  A 12-lead EKG.  Sinus bradycardia at a rate of 59 beats per  minute.  Right bundle branch block, left anterior fascicular block.  First degree AV block.  Poor R-wave progression.    Chest x-ray shows no acute disease.  Old left rib fracture.   Labs significant for a hemoglobin of 15.4, WBC of 7.6, BUN and  creatinine of 20 and 1.5, potassium of 3.9.  Point of care marker  negative times one.   IMPRESSION:  The patient is a 70 year old with no known history of  coronary artery disease, presents with chest pain 7/10 in intensity by  his report lasting 3 to 3-1/2 hours pain.  He has had in the past about  2 months ago.  Note, he has had a stricture in the past.  Question of  that led to some similar symptoms.  Note, had an EKG about one year ago.   Currently pain free.  EKG with sinus bradycardia, first-degree AV block,  left anterior fascicular block, poor R-wave progression,  no old one to  compare.  Now he had an EKG remotely also by Dr. Renne Crigler.   PLAN:  Recommend that he get admitted to rule out MI treat with aspirin  and heparin.  Get old EKG.  If different, echocardiogram, consider new  __________ cardiac catheterization based on echo.  If ruled out,  __________EKG nuclear study at the least.   The patient understands the risks of sudden death and MI with leaving,  but he would like to go home AMA.  Again, I have reviewed this with him  and his wife.  He understands and would like to go home.   I will tell him to take aspirin 81 mg daily.  I have given a  prescription for several nitroglycerin to take, and if he has recurrent  pain to call 9-1-1.  Otherwise, we will be in touch with him in the  morning regarding further testing.  Sinus Paul rest of the bases.  He  was term happy to Dr. far as noted the patient on Doppler thanks      Pricilla Riffle, MD, [facility]  Electronically Signed     PVR/MEDQ  D:  05/30/2006  T:  05/31/2006  Job:  [id number]   cc:   Soyla Murphy. Renne Crigler, M.D.

## 2010-10-14 NOTE — Procedures (Signed)
[facility]  Patient:    Timothy Spencer, Timothy Spencer                      MRN: [id number] Proc. Date: 01/17/00 Adm. Date:  [id number] Attending:  Deneen Harts CC:         Soyla Murphy. Renne Crigler, M.D.   Procedure Report  PROCEDURE:  Panendoscopy.  INDICATIONS FOR PROCEDURE:  A 70 year old white male with Laennec cirrhosis and hemachromatosis undergoing endoscopy to assess esophageal varices. The patient is asymptomatic.  DESCRIPTION OF PROCEDURE:  After reviewing the nature of the procedure with the patient including the potential risks of hemorrhage and perforation, informed consent was signed.  The patient was premedicated receiving topical anesthetic followed by Droperidol 5 mg, Versed 10 mg and fentanyl 75 mcg IV administered in divided doses prior to the onset of the procedure.  Using the Olympus video endoscope, the proximal esophagus was intubated under direct vision. Normal oropharynx. No lesion of the epiglottis, vocal chords or piriform sinus. The esophagus was entered and appeared normal in its proximal, and mid segment. The distal esophagus was notable for a Schatzki ring which offered mild resistance to passage of the endoscope. The has a moderate hiatal hernia which was non-inflamed.  The scope was advance into the stomach. The gastric fundus and body were filled with retained food. The antrum was remarkable for erythematous mucosal streaking emanating from the pylorus.  The pylorus was easily traversed revealing a normal duodenal bulb and second portion. Retroflexed view of the angularis, lesser curve, gastric cardia and fundus were normal except for the hiatal hernia defect noted. No fundal varices identified.  The stomach decompressed, scope withdrawn. The patient tolerated the procedure without difficulty being maintained on Datascope monitor and low flow oxygen throughout. Returned to recovery in stable condition.  ASSESSMENT:  1. No  esophageal varices identified.  2. Schatzki ring with partial obstruction disrupted with passage of the     scope.  3. Moderate hiatal hernia, non-inflamed.  4. Retained food.  5. Antritis--mild. Suspect alcohol induced.  RECOMMENDATIONS:  1. Antireflux measures.  2. Nexium 40 mg 1 capsule p.o. q.a.m. for the next month.  3. Return office visit 1 month. DD:  01/17/00 TD:  01/18/00 Job: [id number]

## 2010-10-18 ENCOUNTER — Ambulatory Visit (INDEPENDENT_AMBULATORY_CARE_PROVIDER_SITE_OTHER): Payer: Medicare Other | Admitting: Psychology

## 2010-10-18 DIAGNOSIS — F411 Generalized anxiety disorder: Secondary | ICD-10-CM

## 2010-11-14 ENCOUNTER — Ambulatory Visit (INDEPENDENT_AMBULATORY_CARE_PROVIDER_SITE_OTHER): Payer: Medicare Other | Admitting: Psychology

## 2010-11-14 DIAGNOSIS — F411 Generalized anxiety disorder: Secondary | ICD-10-CM

## 2010-12-12 ENCOUNTER — Ambulatory Visit: Payer: Medicare Other | Admitting: Psychology

## 2010-12-29 ENCOUNTER — Ambulatory Visit (INDEPENDENT_AMBULATORY_CARE_PROVIDER_SITE_OTHER): Payer: Medicare Other | Admitting: Psychology

## 2010-12-29 DIAGNOSIS — F411 Generalized anxiety disorder: Secondary | ICD-10-CM

## 2011-02-06 ENCOUNTER — Ambulatory Visit: Payer: Medicare Other | Admitting: Psychology

## 2011-02-07 ENCOUNTER — Ambulatory Visit (INDEPENDENT_AMBULATORY_CARE_PROVIDER_SITE_OTHER): Payer: Medicare Other | Admitting: Psychology

## 2011-02-07 DIAGNOSIS — F411 Generalized anxiety disorder: Secondary | ICD-10-CM

## 2011-02-17 LAB — BASIC METABOLIC PANEL
BUN: 24 — ABNORMAL HIGH
CO2: 26
Calcium: 9.4
Chloride: 104
Creatinine, Ser: 1.61 — ABNORMAL HIGH
GFR calc Af Amer: 52 — ABNORMAL LOW
GFR calc non Af Amer: 43 — ABNORMAL LOW
Glucose, Bld: 92
Potassium: 4.6
Sodium: 139

## 2011-02-17 LAB — CBC
HCT: 47.8
Hemoglobin: 16.9
MCHC: 35.4
MCV: 94.3
Platelets: 106 — ABNORMAL LOW
RBC: 5.07
RDW: 13.8
WBC: 7.3

## 2011-02-27 LAB — BASIC METABOLIC PANEL
BUN: 17
CO2: 24
Calcium: 9.4
Chloride: 106
Creatinine, Ser: 1.13
GFR calc Af Amer: >60
GFR calc non Af Amer: >60
Glucose, Bld: 82
Potassium: 3.8
Sodium: 139

## 2011-03-01 LAB — CBC
HCT: 46.5
Hemoglobin: 16.3
MCHC: 35
MCV: 100.4 — ABNORMAL HIGH
Platelets: 88 — ABNORMAL LOW
RBC: 4.63
RDW: 13.9
WBC: 5.4

## 2011-03-01 LAB — DIFFERENTIAL
Basophils Absolute: 0
Basophils Relative: 1
Eosinophils Absolute: 0.1
Eosinophils Relative: 2
Lymphocytes Relative: 24
Lymphs Abs: 1.3
Monocytes Absolute: 0.4
Monocytes Relative: 7
Neutro Abs: 3.6
Neutrophils Relative %: 67

## 2011-03-21 ENCOUNTER — Other Ambulatory Visit: Payer: Self-pay | Admitting: Gastroenterology

## 2011-03-23 ENCOUNTER — Ambulatory Visit
Admission: RE | Admit: 2011-03-23 | Discharge: 2011-03-23 | Disposition: A | Discharge status: Home / Self Care | Payer: Medicare Other | Source: Ambulatory Visit | Attending: Gastroenterology | Admitting: Gastroenterology

## 2011-04-03 ENCOUNTER — Ambulatory Visit (INDEPENDENT_AMBULATORY_CARE_PROVIDER_SITE_OTHER): Payer: Medicare Other | Admitting: Psychology

## 2011-04-03 DIAGNOSIS — F411 Generalized anxiety disorder: Secondary | ICD-10-CM

## 2011-05-29 ENCOUNTER — Ambulatory Visit (INDEPENDENT_AMBULATORY_CARE_PROVIDER_SITE_OTHER): Payer: Medicare Other | Admitting: Psychology

## 2011-05-29 DIAGNOSIS — F411 Generalized anxiety disorder: Secondary | ICD-10-CM

## 2011-06-26 ENCOUNTER — Ambulatory Visit: Payer: Self-pay | Admitting: Psychology

## 2011-07-24 ENCOUNTER — Ambulatory Visit: Payer: Medicare Other | Admitting: Psychology

## 2011-07-31 ENCOUNTER — Ambulatory Visit (INDEPENDENT_AMBULATORY_CARE_PROVIDER_SITE_OTHER): Payer: Medicare Other | Admitting: Psychology

## 2011-07-31 DIAGNOSIS — F411 Generalized anxiety disorder: Secondary | ICD-10-CM

## 2011-08-21 ENCOUNTER — Ambulatory Visit: Payer: Medicare Other | Admitting: Psychology

## 2011-11-28 HISTORY — PX: TRANSTHORACIC ECHOCARDIOGRAM: SHX275

## 2012-02-12 HISTORY — PX: OTHER SURGICAL HISTORY: SHX169

## 2012-02-20 ENCOUNTER — Other Ambulatory Visit: Payer: Self-pay | Admitting: Cardiovascular Disease

## 2012-02-20 ENCOUNTER — Ambulatory Visit
Admission: RE | Admit: 2012-02-20 | Discharge: 2012-02-20 | Disposition: A | Discharge status: Home / Self Care | Payer: Medicare Other | Source: Ambulatory Visit | Attending: Cardiovascular Disease | Admitting: Cardiovascular Disease

## 2012-02-20 DIAGNOSIS — Z87891 Personal history of nicotine dependence: Secondary | ICD-10-CM

## 2012-03-29 ENCOUNTER — Other Ambulatory Visit: Payer: Self-pay | Admitting: Gastroenterology

## 2012-03-29 DIAGNOSIS — K769 Liver disease, unspecified: Secondary | ICD-10-CM

## 2012-04-05 ENCOUNTER — Ambulatory Visit
Admission: RE | Admit: 2012-04-05 | Discharge: 2012-04-05 | Disposition: A | Discharge status: Home / Self Care | Payer: Medicare Other | Source: Ambulatory Visit | Attending: Gastroenterology | Admitting: Gastroenterology

## 2012-04-05 DIAGNOSIS — K769 Liver disease, unspecified: Secondary | ICD-10-CM

## 2012-05-09 ENCOUNTER — Other Ambulatory Visit: Payer: Self-pay | Admitting: Internal Medicine

## 2012-05-09 DIAGNOSIS — R221 Localized swelling, mass and lump, neck: Secondary | ICD-10-CM

## 2012-05-10 ENCOUNTER — Ambulatory Visit
Admission: RE | Admit: 2012-05-10 | Discharge: 2012-05-10 | Disposition: A | Discharge status: Home / Self Care | Payer: Medicare Other | Source: Ambulatory Visit | Attending: Internal Medicine | Admitting: Internal Medicine

## 2012-05-10 DIAGNOSIS — R221 Localized swelling, mass and lump, neck: Secondary | ICD-10-CM

## 2012-09-25 ENCOUNTER — Ambulatory Visit: Payer: Medicare Other | Admitting: Neurology

## 2012-12-02 ENCOUNTER — Other Ambulatory Visit: Payer: Self-pay

## 2012-12-02 MED ORDER — ATORVASTATIN CALCIUM 40 MG PO TABS: 40.0000 mg | ORAL_TABLET | Freq: Every day | ORAL | Status: DC

## 2012-12-02 MED ORDER — ISOSORBIDE MONONITRATE ER 30 MG PO TB24: 30.0000 mg | ORAL_TABLET | Freq: Every day | ORAL | Status: DC

## 2012-12-02 NOTE — Telephone Encounter (Signed)
Rx was sent to pharmacy electronically. 

## 2012-12-30 ENCOUNTER — Other Ambulatory Visit: Payer: Self-pay | Admitting: *Deleted

## 2012-12-30 MED ORDER — RANOLAZINE ER 500 MG PO TB12: 500.0000 mg | ORAL_TABLET | Freq: Two times a day (BID) | ORAL | Status: DC

## 2012-12-30 NOTE — Telephone Encounter (Signed)
Rx was sent to pharmacy electronically. 

## 2013-03-18 ENCOUNTER — Ambulatory Visit (INDEPENDENT_AMBULATORY_CARE_PROVIDER_SITE_OTHER): Payer: Medicare Other | Admitting: Cardiovascular Disease

## 2013-03-18 ENCOUNTER — Encounter: Payer: Self-pay | Admitting: Cardiovascular Disease

## 2013-03-18 VITALS — BP 110/70 | HR 60 | Ht 73.0 in | Wt 193.9 lb

## 2013-03-18 DIAGNOSIS — R5381 Other malaise: Secondary | ICD-10-CM

## 2013-03-18 DIAGNOSIS — R5383 Other fatigue: Secondary | ICD-10-CM

## 2013-03-18 DIAGNOSIS — E785 Hyperlipidemia, unspecified: Secondary | ICD-10-CM | POA: Insufficient documentation

## 2013-03-18 DIAGNOSIS — I2510 Atherosclerotic heart disease of native coronary artery without angina pectoris: Secondary | ICD-10-CM

## 2013-03-18 DIAGNOSIS — E039 Hypothyroidism, unspecified: Secondary | ICD-10-CM | POA: Insufficient documentation

## 2013-03-18 MED ORDER — METOPROLOL SUCCINATE ER 25 MG PO TB24: 12.5000 mg | ORAL_TABLET | Freq: Every day | ORAL | Status: DC

## 2013-03-18 NOTE — Progress Notes (Signed)
Patient ID: Timothy Spencer, male   DOB: Jun 09, 1940, 72 y.o.   MRN: [id number]      HPI: Timothy Spencer is a 72 y.o. male who presents for one-year cardiology evaluation.  [name] has a previous long-standing history of tobacco but quit smoking in May 2012 after smoking for over 40 years. He had undergone cardiac catheterization in January 2012 which showed mild CAD with mid systolic bridging of his LAD which narrowed up to 95% during systole and was essentially normal during diastole. There is evidence of 50-60% circumflex stenosis, 34% mid AV groove stenosis and he had a nondominant right coronary artery. He is benefited with the addition of Ranexa to his medical regimen. A cardiopulmonary neck test subsequently suggested an ischemic response. He has been exercising regularly. He no longer smokes cigarettes. He denies significant shortness of breath. However, he does note fatigue. 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 He denies issues with significant sleep disturbance he recently saw Dr. Renne Crigler.  Laboratory has been done by Dr. Renne Crigler. He denies exertional chest pain. He denies PND or orthopnea. He is unaware of palpitations.  Past Medical History  Diagnosis Date  . Hypertension   . CAD (coronary artery disease)   . Hyperlipidemia   . Hemochromatosis   . H/O ETOH abuse   . Osteopenia   . GERD (gastroesophageal reflux disease)   . Anxiety and depression   . Glaucoma   . Hypothyroidism   . First degree AV block   . Gout   . ED (erectile dysfunction)   . RLS (restless legs syndrome)   . BPH (benign prostatic hyperplasia)   . OSA (obstructive sleep apnea)   . DOE (dyspnea on exertion)   . Peripheral neuropathy   . Thrombocytopenia   . Gynecomastia     Past Surgical History  Procedure Laterality Date  . Cardiac catheterization  03/27/2008    Medical therapy  . Cardiac catheterization  06/03/2010    Medical theray and smoking cessation  . Cardiopulmonary exercise test   02/12/2012    Peak VO2 63% of predicted  . Transthoracic echocardiogram  11/28/2011    EF >55%, normal    No Known Allergies  Current Outpatient Prescriptions  Medication Sig Dispense Refill  . aspirin 81 MG tablet Take 81 mg by mouth daily.      Marland Kitchen atorvastatin (LIPITOR) 40 MG tablet Take 1 tablet (40 mg total) by mouth daily.  90 tablet  1  . isosorbide mononitrate (IMDUR) 30 MG 24 hr tablet Take 1 tablet (30 mg total) by mouth daily.  90 tablet  1  . levothyroxine (SYNTHROID, LEVOTHROID) 150 MCG tablet Take 1 tablet by mouth daily.      Marland Kitchen lisinopril (PRINIVIL,ZESTRIL) 20 MG tablet Take 10 mg by mouth daily.       . metoprolol succinate (TOPROL-XL) 25 MG 24 hr tablet Take 0.5 tablets (12.5 mg total) by mouth daily.  30 tablet  3  . Multiple Vitamins-Minerals (ICAPS) TABS Take 1 tablet by mouth 2 (two) times daily.      Marland Kitchen omeprazole (PRILOSEC) 40 MG capsule Take 1 capsule by mouth daily.      . ranolazine (RANEXA) 500 MG 12 hr tablet Take 1 tablet (500 mg total) by mouth 2 (two) times daily.  60 tablet  5  . rOPINIRole (REQUIP) 1 MG tablet Take 1 tablet by mouth daily.      . sertraline (ZOLOFT) 100 MG tablet Take 1 tablet by mouth  daily.      . tamsulosin (FLOMAX) 0.4 MG CAPS capsule Take 1 capsule by mouth daily.      . timolol (TIMOPTIC-XR) 0.5 % ophthalmic gel-forming Place 1 drop into both eyes daily.      . zaleplon (SONATA) 10 MG capsule Take 1 capsule by mouth at bedtime.       No current facility-administered medications for this visit.    History   Social History  . Marital Status: Married    Spouse Name: N/A    Number of Children: N/A  . Years of Education: N/A   Occupational History  . Not on file.   Social History Main Topics  . Smoking status: Former Smoker    Quit date: 03/19/2011  . Smokeless tobacco: Not on file  . Alcohol Use: No  . Drug Use: No  . Sexual Activity: Not on file   Other Topics Concern  . Not on file   Social History Narrative  . No  narrative on file    Family History  Problem Relation Age of Onset  . Adopted: Yes   Additional social history is notable in that he is married has 2 children. He does walk. He quit smoking in 2012. His alcohol use.  ROS is negative for fevers, chills or night sweats.  He denies change in visual symptoms. He denies tachycardia palpitations. He does note fatigue. In the past he did have a history of EtOH use. There has been issues with anxiety/depression. He denies chest pressure. He does have a history of hypothyroidism. He has not had known diabetes. He does have a history of bundle branch block. Is also history of restless leg for which he takes Requip. He denies claudication. He denies paresthesias. Other comprehensive 12 point system review is negative.  PE BP 110/70  Pulse 60  Ht 6\' 1"  (1.854 m)  Wt 193 lb 14.4 oz (87.952 kg)  BMI 25.59 kg/m2  Repeat blood pressure is 110/70 supine and 06/01/1966 standing. General: Alert, oriented, no distress.  Skin: normal turgor, no rashes HEENT: Normocephalic, atraumatic. Pupils round and reactive; sclera anicteric;no lid lag.  Nose without nasal septal hypertrophy Mouth/Parynx benign; Mallinpatti scale 3 Neck: No JVD, no carotid briuts Lungs: clear to ausculatation and percussion; no wheezing or rales Heart: RRR, s1 s2 normal 1/6 systolic murmur. Abdomen: soft, nontender; no hepatosplenomehaly, BS+; abdominal aorta nontender and not dilated by palpation. Back is notable for a small lipoma in the right paraspinal region of the mid thoracic area. Pulses 2+ Extremities: no clubbing cyanosis or edema, Homan's sign negative  Neurologic: grossly nonfocal Psychologic: normal affect and mood.  ECG: Sinus rhythm with right bundle branch block with first-degree AV block.  LABS:  BMET    Component Value Date/Time   NA 140 06/03/2010 0926   K 4.5 06/03/2010 0926   CL 109 06/03/2010 0926   CO2 25 06/03/2010 0756   GLUCOSE 150* 06/03/2010 0926   BUN  32* 06/03/2010 0926   CREATININE 1.6* 06/03/2010 0926   CALCIUM 9.4 06/03/2010 0756   GFRNONAA 37* 06/03/2010 0756   GFRAA  Value: 45        The eGFR has been calculated using the MDRD equation. This calculation has not been validated in all clinical situations. eGFR's persistently <60 mL/min signify possible Chronic Kidney Disease.* 06/03/2010 0756     Hepatic Function Panel     Component Value Date/Time   PROT 7.4 12/06/2009 1413   ALBUMIN 4.5 12/06/2009 1413  AST 17 12/06/2009 1413   ALT 9 12/06/2009 1413   ALKPHOS 87 12/06/2009 1413   BILITOT 0.9 12/06/2009 1413     CBC    Component Value Date/Time   WBC 7.2 06/03/2010 0756   WBC 8.2 12/06/2009 1413   RBC 4.23 06/03/2010 0756   RBC 4.40 12/06/2009 1413   HGB 11.9* 06/03/2010 0926   HGB 14.4 12/06/2009 1413   HCT 35.0* 06/03/2010 0926   HCT 42.0 12/06/2009 1413   PLT 103* 06/03/2010 0756   PLT 133* 12/06/2009 1413   MCV 93.6 06/03/2010 0756   MCV 95.4 12/06/2009 1413   MCH 31.9 06/03/2010 0756   MCH 32.8 12/06/2009 1413   MCHC 34.1 06/03/2010 0756   MCHC 34.4 12/06/2009 1413   RDW 14.3 06/03/2010 0756   RDW 12.8 12/06/2009 1413   LYMPHSABS 1.7 12/06/2009 1413   LYMPHSABS 1.2 04/02/2009 1207   MONOABS 0.3 12/06/2009 1413   MONOABS 0.2 04/02/2009 1207   EOSABS 0.1 12/06/2009 1413   EOSABS 0.0 04/02/2009 1207   BASOSABS 0.0 12/06/2009 1413   BASOSABS 0.1 04/02/2009 1207     BNP No results found for this basename: probnp    Lipid Panel  No results found for this basename: chol, trig, hdl, cholhdl, vldl, ldlcalc     RADIOLOGY: No results found.    ASSESSMENT AND PLAN: Mr. Marte chief complaint today is that of fatigability. 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 There is first-degree  A-V block. He does have coronary artery disease noted at cardiac catheterization on 12/02/2010. He is not having anginal symptoms. I recommended he reduce his Toprol dose from 25 mg to 12.5 mg. Blood pressure is on the low side  but was not significantly orthostatic when checked by me. If his blood pressure continues to be low it may be possible to further reduce his lisinopril from 10 mg to 5 mg Tylenol to this presently. I will see him back in the office in 6 months followup evaluation or sooner if problems arise.     Lennette Bihari, MD, [facility]  03/18/2013 3:39 PM

## 2013-03-18 NOTE — Patient Instructions (Signed)
Your physician has recommended you make the following change in your medication: take 1/2 tablet of the metoprolol 25 mg once daily. This has already been sent to the pharmacy.  Your physician recommends that you schedule a follow-up appointment in: 6 MONTHS.

## 2013-03-19 ENCOUNTER — Encounter: Payer: Self-pay | Admitting: Cardiovascular Disease

## 2013-05-30 ENCOUNTER — Other Ambulatory Visit: Payer: Self-pay | Admitting: Cardiovascular Disease

## 2013-06-30 ENCOUNTER — Other Ambulatory Visit: Payer: Self-pay | Admitting: Cardiovascular Disease

## 2013-06-30 NOTE — Telephone Encounter (Signed)
Rx was sent to pharmacy electronically. 

## 2013-09-15 ENCOUNTER — Ambulatory Visit: Payer: Medicare Other | Admitting: Cardiovascular Disease

## 2013-10-13 ENCOUNTER — Encounter: Payer: Self-pay | Admitting: Cardiovascular Disease

## 2013-10-13 ENCOUNTER — Ambulatory Visit (INDEPENDENT_AMBULATORY_CARE_PROVIDER_SITE_OTHER): Payer: Medicare Other | Admitting: Cardiovascular Disease

## 2013-10-13 VITALS — BP 110/56 | HR 62 | Ht 72.0 in | Wt 190.1 lb

## 2013-10-13 DIAGNOSIS — I2510 Atherosclerotic heart disease of native coronary artery without angina pectoris: Secondary | ICD-10-CM

## 2013-10-13 DIAGNOSIS — E785 Hyperlipidemia, unspecified: Secondary | ICD-10-CM

## 2013-10-13 DIAGNOSIS — E039 Hypothyroidism, unspecified: Secondary | ICD-10-CM

## 2013-10-13 DIAGNOSIS — R5381 Other malaise: Secondary | ICD-10-CM

## 2013-10-13 DIAGNOSIS — R5383 Other fatigue: Secondary | ICD-10-CM

## 2013-10-13 NOTE — Progress Notes (Signed)
Patient ID: Timothy Spencer, male   DOB: 23-Jun-1940, 73 y.o.   MRN: [id number]      HPI: Timothy Spencer is a 73 y.o. male who presents for seven monthcardiology evaluation.  Mr. Kronick has a previous long-standing history of tobacco but quit smoking in May 2012 after smoking for over 40 years. He had undergone cardiac catheterization in January 2012 which showed mild CAD with mid systolic bridging of his LAD which narrowed up to 95% during systole and was essentially normal during diastole, 50-60% circumflex stenosis, 30-40% mid AV groove stenosis and he had a nondominant right coronary artery. He is benefited with the addition of Ranexa to his medical regimen. A cardiopulmonary met test subsequently suggested an ischemic response. He has been exercising regularly. He no longer smokes cigarettes. He denies significant shortness of breath. However, he does note fatigue. 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 He denies issues with significant sleep disturbance he recently saw Dr. Shelia Media.   He tells me approximately 2 weeks ago.  He was taken off his metoprolol due to continued fatigability.  When I had last seen him.  I reduced his dose to just 12.5 mg.  He does believe that the fatigue is somewhat better off this medicine, but he still experiences this.  He does have a history of hypothyroidism, and he has been on Synthroid 150 mcg.  He does not believe his thyroid was rechecked recently.  He does complain of constipation.  He apparently has been taking the Ranexa 1000 mg twice a day.  He denies episodes of chest pressure.  He is continued to stay off cigarettes.  He denies palpitations.  Laboratory was done by Dr. Shelia Media in April, which showed a hemoglobin of 14.2, hematocrit 42.0.  BUN 2621.5.  Lipid studies were excellent with a total cholesterol 119 triglycerides were slightly elevated at 179, HDL 44, calculated LDL 39.  Urinalysis was negative.     Past Medical History  Diagnosis Date  .  Hypertension   . CAD (coronary artery disease)   . Hyperlipidemia   . Hemochromatosis   . H/O ETOH abuse   . Osteopenia   . GERD (gastroesophageal reflux disease)   . Anxiety and depression   . Glaucoma   . Hypothyroidism   . First degree AV block   . Gout   . ED (erectile dysfunction)   . RLS (restless legs syndrome)   . BPH (benign prostatic hyperplasia)   . OSA (obstructive sleep apnea)   . DOE (dyspnea on exertion)   . Peripheral neuropathy   . Thrombocytopenia   . Gynecomastia     Past Surgical History  Procedure Laterality Date  . Cardiac catheterization  03/27/2008    Medical therapy  . Cardiac catheterization  06/03/2010    Medical theray and smoking cessation  . Cardiopulmonary exercise test  02/12/2012    Peak VO2 63% of predicted  . Transthoracic echocardiogram  11/28/2011    EF >55%, normal    No Known Allergies  Current Outpatient Prescriptions  Medication Sig Dispense Refill  . aspirin 81 MG tablet Take 81 mg by mouth daily.      Marland Kitchen atorvastatin (LIPITOR) 40 MG tablet TAKE 1 TABLET BY MOUTH DAILY  90 tablet  2  . isosorbide mononitrate (IMDUR) 30 MG 24 hr tablet TAKE 1 TABLET BY MOUTH DAILY  90 tablet  2  . levothyroxine (SYNTHROID, LEVOTHROID) 150 MCG tablet Take 1 tablet by mouth daily.      Marland Kitchen  lisinopril (PRINIVIL,ZESTRIL) 20 MG tablet Take 10 mg by mouth daily.       . Multiple Vitamins-Minerals (ICAPS) TABS Take 1 tablet by mouth 2 (two) times daily.      Marland Kitchen omeprazole (PRILOSEC) 40 MG capsule Take 1 capsule by mouth daily.      Marland Kitchen RANEXA 500 MG 12 hr tablet TAKE 1 TABLET BY MOUTH TWICE DAILY  60 tablet  8  . rOPINIRole (REQUIP) 1 MG tablet Take 1 tablet by mouth daily.      . sertraline (ZOLOFT) 100 MG tablet Take 1 tablet by mouth daily.      . tamsulosin (FLOMAX) 0.4 MG CAPS capsule Take 1 capsule by mouth daily.      . timolol (TIMOPTIC-XR) 0.5 % ophthalmic gel-forming Place 1 drop into both eyes daily.      . zaleplon (SONATA) 10 MG capsule Take 1  capsule by mouth at bedtime.       No current facility-administered medications for this visit.    History   Social History  . Marital Status: Married    Spouse Name: N/A    Number of Children: N/A  . Years of Education: N/A   Occupational History  . Not on file.   Social History Main Topics  . Smoking status: Former Smoker    Quit date: 03/19/2011  . Smokeless tobacco: Never Used  . Alcohol Use: No  . Drug Use: No  . Sexual Activity: Not on file   Other Topics Concern  . Not on file   Social History Narrative  . No narrative on file    Family History  Problem Relation Age of Onset  . Adopted: Yes   Additional social history is notable in that he is married has 2 children. He does walk. He quit smoking in 2012. His alcohol use.   ROS General: Positive for mild fatigue; No fevers, chills, or night sweats;  HEENT: Negative; No changes in vision or hearing, sinus congestion, difficulty swallowing Pulmonary: Negative; No cough, wheezing, shortness of breath, hemoptysis Cardiovascular: Negative; No chest pain, presyncope, syncope, palpatations GI: Positive for constipation; No nausea, vomiting, diarrhea, or abdominal pain GU: Negative; No dysuria, hematuria, or difficulty voiding Musculoskeletal: Negative; no myalgias, joint pain, or weakness Hematologic/Oncology: Negative; no easy bruising, bleeding Endocrine: Positive for hypothyroidism , on Synthroid replacement; no heat/cold intolerance; no diabetes Neuro: Negative; no changes in balance, headaches Skin: Negative; No rashes or skin lesions Psychiatric: Negative; No behavioral problems, depression Sleep: Negative; No snoring, daytime sleepiness, hypersomnolence, bruxism, restless legs, hypnogognic hallucinations, no cataplexy Other comprehensive 14 point system review is negative.   PE BP 110/56  Pulse 62  Ht 6' (1.829 m)  Wt 190 lb 1.6 oz (86.229 kg)  BMI 25.78 kg/m2  General: Alert, oriented, no distress.    Skin: normal turgor, no rashes HEENT: Normocephalic, atraumatic. Pupils round and reactive; sclera anicteric;no lid lag.  Nose without nasal septal hypertrophy Mouth/Parynx benign; Mallinpatti scale 3 Neck: No JVD, no carotid bruits.  Normal carotid upstroke.  Lungs: clear to ausculatation and percussion; no wheezing or rales Chest wall: Nontender to palpation Heart: RRR, s1 s2 normal 1/6 systolic murmur.  No diastolic murmur.  No S3, S4, gallop.  No gallops, rubs, thrills or heaves to Abdomen: soft, nontender; no hepatosplenomehaly, BS+; abdominal aorta nontender and not dilated by palpation. Back is notable for a small lipoma in the right paraspinal region of the mid thoracic area. Pulses 2+ Extremities: no clubbing cyanosis or edema, Homan's sign  negative  Neurologic: grossly nonfocal Psychologic: normal affect and mood.  ECG (apparently read by me) normal sinus rhythm at 62 beats per minute.  Right bundle branch block with repolarization changes.  First-degree AV block with PR interval of 252 ms.  Prior October 2014 ECG: Sinus rhythm with right bundle branch block with first-degree AV block.  LABS:  BMET    Component Value Date/Time   NA 140 06/03/2010 0926   K 4.5 06/03/2010 0926   CL 109 06/03/2010 0926   CO2 25 06/03/2010 0756   GLUCOSE 150* 06/03/2010 0926   BUN 32* 06/03/2010 0926   CREATININE 1.6* 06/03/2010 0926   CALCIUM 9.4 06/03/2010 0756   GFRNONAA 37* 06/03/2010 0756   GFRAA  Value: 45        The eGFR has been calculated using the MDRD equation. This calculation has not been validated in all clinical situations. eGFR's persistently <60 mL/min signify possible Chronic Kidney Disease.* 06/03/2010 0756     Hepatic Function Panel     Component Value Date/Time   PROT 7.4 12/06/2009 1413   ALBUMIN 4.5 12/06/2009 1413   AST 17 12/06/2009 1413   ALT 9 12/06/2009 1413   ALKPHOS 87 12/06/2009 1413   BILITOT 0.9 12/06/2009 1413     CBC    Component Value Date/Time   WBC 7.2 06/03/2010  0756   WBC 8.2 12/06/2009 1413   RBC 4.23 06/03/2010 0756   RBC 4.40 12/06/2009 1413   HGB 11.9* 06/03/2010 0926   HGB 14.4 12/06/2009 1413   HCT 35.0* 06/03/2010 0926   HCT 42.0 12/06/2009 1413   PLT 103* 06/03/2010 0756   PLT 133* 12/06/2009 1413   MCV 93.6 06/03/2010 0756   MCV 95.4 12/06/2009 1413   MCH 31.9 06/03/2010 0756   MCH 32.8 12/06/2009 1413   MCHC 34.1 06/03/2010 0756   MCHC 34.4 12/06/2009 1413   RDW 14.3 06/03/2010 0756   RDW 12.8 12/06/2009 1413   LYMPHSABS 1.7 12/06/2009 1413   LYMPHSABS 1.2 04/02/2009 1207   MONOABS 0.3 12/06/2009 1413   MONOABS 0.2 04/02/2009 1207   EOSABS 0.1 12/06/2009 1413   EOSABS 0.0 04/02/2009 1207   BASOSABS 0.0 12/06/2009 1413   BASOSABS 0.1 04/02/2009 1207     BNP No results found for this basename: probnp    Lipid Panel  No results found for this basename: chol,  trig,  hdl,  cholhdl,  vldl,  ldlcalc     RADIOLOGY: No results found.    ASSESSMENT AND PLAN: Mr. Brandenburg has established coronary artery disease with documented midsystolic bridging of the LAD up to 95%, which seems to improve with Ranexa therapy.  He also had mild concomitant CAD as noted above.  He does complain of constipation.  I suggested he try reducing his Ranexa, down to 500 mg twice a day.  His fatigue is somewhat better with slow withdrawal, and discontinuance of his metoprolol.  He just said that when he sees Dr. Renne Crigler back.  Followup thyroid function studies also be obtained to make certain his current dose of Synthroid is optimal.  His cardiac catheterization was done in January 2012.  In January 2016 I am recommending that he undergo a four-year followup assessment of his coronary disease and have scheduled him for an exercise Myoview study.  His right bundle branch block is chronic.  He does have first-degree AV block, but is not on any significant rate control medication.  I will see him back in the office in  January in followup of his nuclear perfusion scan.    Troy Sine, MD, [facility]  10/13/2013 11:42 AM

## 2013-10-13 NOTE — Patient Instructions (Signed)
Your physician has recommended you make the following change in your medication: decrease the Ranexa down to 1 tablet twice daily from 2 twice daily.  Your physician has requested that you have a lexiscan myoview. For further information please visit [email or web address] Please follow instruction sheet, as given. This will be scheduled in January 2016.  Your physician recommends that you schedule a follow-up appointment in: January 2016

## 2014-02-23 ENCOUNTER — Other Ambulatory Visit: Payer: Self-pay | Admitting: Cardiovascular Disease

## 2014-02-23 NOTE — Telephone Encounter (Signed)
Rx was sent to pharmacy electronically. 

## 2014-05-06 ENCOUNTER — Other Ambulatory Visit: Payer: Self-pay | Admitting: Cardiovascular Disease

## 2014-05-06 NOTE — Telephone Encounter (Signed)
Rx was sent to pharmacy electronically. 

## 2014-07-15 ENCOUNTER — Ambulatory Visit (INDEPENDENT_AMBULATORY_CARE_PROVIDER_SITE_OTHER): Payer: Medicare Other | Admitting: Cardiovascular Disease

## 2014-07-15 VITALS — BP 124/66 | HR 86 | Ht 72.0 in | Wt 187.0 lb

## 2014-07-15 DIAGNOSIS — I2510 Atherosclerotic heart disease of native coronary artery without angina pectoris: Secondary | ICD-10-CM

## 2014-07-15 DIAGNOSIS — I2583 Coronary atherosclerosis due to lipid rich plaque: Secondary | ICD-10-CM

## 2014-07-15 DIAGNOSIS — I4510 Unspecified right bundle-branch block: Secondary | ICD-10-CM

## 2014-07-15 DIAGNOSIS — I440 Atrioventricular block, first degree: Secondary | ICD-10-CM

## 2014-07-15 DIAGNOSIS — E785 Hyperlipidemia, unspecified: Secondary | ICD-10-CM

## 2014-07-15 DIAGNOSIS — E038 Other specified hypothyroidism: Secondary | ICD-10-CM

## 2014-07-15 NOTE — Patient Instructions (Signed)
Your physician has requested that you have a lexiscan myoview and office in 6 months.

## 2014-07-17 ENCOUNTER — Encounter: Payer: Self-pay | Admitting: Cardiovascular Disease

## 2014-07-17 DIAGNOSIS — I4510 Unspecified right bundle-branch block: Secondary | ICD-10-CM | POA: Insufficient documentation

## 2014-07-17 DIAGNOSIS — I440 Atrioventricular block, first degree: Secondary | ICD-10-CM | POA: Insufficient documentation

## 2014-07-17 DIAGNOSIS — I2510 Atherosclerotic heart disease of native coronary artery without angina pectoris: Secondary | ICD-10-CM | POA: Insufficient documentation

## 2014-07-17 NOTE — Progress Notes (Signed)
Patient ID: Timothy Spencer, male   DOB: 09/25/1940, 74 y.o.   MRN: [id number]      HPI: Timothy Spencer is a 74 y.o. male who presents for follow-up cardiology evaluation.  Timothy Spencer has a previous long-standing history of tobacco but quit smoking in May 2012 after smoking for over 40 years. He had undergone cardiac catheterization initially in 2009 and subsequently  in January 2012 which showed mild CAD with mid systolic bridging of his LAD which narrowed up to 95% during systole and was essentially normal during diastole, 50-60% circumflex stenosis, 30-40% mid AV groove stenosis and he had a nondominant right coronary artery. He is benefited with the addition of Ranexa to his medical regimen. A cardiopulmonary met test subsequently suggested an ischemic response. He has been exercising regularly. He no longer smokes cigarettes. He denies significant shortness of breath. However, he does note fatigue. 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 He denies issues with significant sleep disturbance he recently saw Dr. Shelia Media.   I last saw him in May 2015 and prior to that office visit he had been taking off his metoprolol due to continued fatigability. He does have a history of hypothyroidism, and he has been on Synthroid 150 mcg.  He does not believe his thyroid was rechecked recently.  He does complain of constipation.  He apparently has been taking the Ranexa 1000 mg twice a day.  He denies episodes of chest pressure.  He is continued to stay off cigarettes.  He denies palpitations.   Laboratory done by Dr. Shelia Media in April 2015 showed a hemoglobin of 14.2, hematocrit 42.0.  BUN 2621.5.  Lipid studies were excellent with a total cholesterol 119 triglycerides were slightly elevated at 179, HDL 44, calculated LDL 39.  Urinalysis was negative.   Presently, he denies any chest pain.  He does note a change in his stool consistency.  He is unaware of any black or tarry stools.  He denies any bright red  blood per rectum.    Past Medical History  Diagnosis Date  . Hypertension   . CAD (coronary artery disease)   . Hyperlipidemia   . Hemochromatosis   . H/O ETOH abuse   . Osteopenia   . GERD (gastroesophageal reflux disease)   . Anxiety and depression   . Glaucoma   . Hypothyroidism   . First degree AV block   . Gout   . ED (erectile dysfunction)   . RLS (restless legs syndrome)   . BPH (benign prostatic hyperplasia)   . OSA (obstructive sleep apnea)   . DOE (dyspnea on exertion)   . Peripheral neuropathy   . Thrombocytopenia   . Gynecomastia     Past Surgical History  Procedure Laterality Date  . Cardiac catheterization  03/27/2008    Medical therapy  . Cardiac catheterization  06/03/2010    Medical theray and smoking cessation  . Cardiopulmonary exercise test  02/12/2012    Peak VO2 63% of predicted  . Transthoracic echocardiogram  11/28/2011    EF >55%, normal    No Known Allergies  Current Outpatient Prescriptions  Medication Sig Dispense Refill  . aspirin 81 MG tablet Take 81 mg by mouth daily.    Marland Kitchen atorvastatin (LIPITOR) 40 MG tablet TAKE 1 TABLET BY MOUTH DAILY 90 tablet 2  . isosorbide mononitrate (IMDUR) 30 MG 24 hr tablet TAKE 1 TABLET BY MOUTH EVERY DAY 90 tablet 2  . levothyroxine (SYNTHROID, LEVOTHROID) 150 MCG tablet Take 1  tablet by mouth daily.    Marland Kitchen lisinopril (PRINIVIL,ZESTRIL) 20 MG tablet Take 10 mg by mouth daily.     . Multiple Vitamins-Minerals (ICAPS) TABS Take 1 tablet by mouth 2 (two) times daily.    Marland Kitchen omeprazole (PRILOSEC) 40 MG capsule Take 1 capsule by mouth daily.    Marland Kitchen RANEXA 500 MG 12 hr tablet TAKE 1 TABLET BY MOUTH TWICE DAILY 60 tablet 5  . rOPINIRole (REQUIP) 1 MG tablet Take 1 tablet by mouth daily.    . sertraline (ZOLOFT) 100 MG tablet Take 1 tablet by mouth daily.    . tamsulosin (FLOMAX) 0.4 MG CAPS capsule Take 1 capsule by mouth daily.    . timolol (TIMOPTIC-XR) 0.5 % ophthalmic gel-forming Place 1 drop into both eyes daily.      . zaleplon (SONATA) 10 MG capsule Take 1 capsule by mouth at bedtime.     No current facility-administered medications for this visit.    History   Social History  . Marital Status: Married    Spouse Name: N/A  . Number of Children: N/A  . Years of Education: N/A   Occupational History  . Not on file.   Social History Main Topics  . Smoking status: Former Smoker    Quit date: 03/19/2011  . Smokeless tobacco: Never Used  . Alcohol Use: No  . Drug Use: No  . Sexual Activity: Not on file   Other Topics Concern  . Not on file   Social History Narrative    Family History  Problem Relation Age of Onset  . Adopted: Yes   Additional social history is notable in that he is married has 2 children. He does walk. He quit smoking in 2012. His alcohol use.   ROS General: Positive for mild fatigue; No fevers, chills, or night sweats;  HEENT: Negative; No changes in vision or hearing, sinus congestion, difficulty swallowing Pulmonary: Negative; No cough, wheezing, shortness of breath, hemoptysis Cardiovascular: Negative; No chest pain, presyncope, syncope, palpatations GI: Positive for constipation and difficult to wipe his stool; No nausea, vomiting, diarrhea, or abdominal pain GU: Negative; No dysuria, hematuria, or difficulty voiding Musculoskeletal: Negative; no myalgias, joint pain, or weakness Hematologic/Oncology: Negative; no easy bruising, bleeding Endocrine: Positive for hypothyroidism , on Synthroid replacement; no heat/cold intolerance; no diabetes Neuro: Negative; no changes in balance, headaches Skin: Negative; No rashes or skin lesions Psychiatric: Negative; No behavioral problems, depression Sleep: Negative; No snoring, daytime sleepiness, hypersomnolence, bruxism, restless legs, hypnogognic hallucinations, no cataplexy Other comprehensive 14 point system review is negative.   PE BP 124/66 mmHg  Pulse 86  Ht 6' (1.829 m)  Wt 187 lb (84.823 kg)  BMI 25.36  kg/m2  General: Alert, oriented, no distress.  Skin: normal turgor, no rashes HEENT: Normocephalic, atraumatic. Pupils round and reactive; sclera anicteric;no lid lag.  Nose without nasal septal hypertrophy Mouth/Parynx benign; Mallinpatti scale 3 Neck: No JVD, no carotid bruits.  Normal carotid upstroke.  Lungs: clear to ausculatation and percussion; no wheezing or rales Chest wall: Nontender to palpation Heart: RRR, s1 s2 normal 1/6 systolic murmur.  No diastolic murmur.  No S3, S4, gallop.  No gallops, rubs, thrills or heaves to Abdomen: soft, nontender; no hepatosplenomehaly, BS+; abdominal aorta nontender and not dilated by palpation. Back is notable for a small lipoma in the right paraspinal region of the mid thoracic area. Pulses 2+ Extremities: no clubbing cyanosis or edema, Homan's sign negative  Neurologic: grossly nonfocal Psychologic: normal affect and mood.  ECG (  independently read by me): Sinus rhythm at 64 bpm. ,  First-degree AV block.  Right bundle-branch block with repolarization changes.  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  The computer was incorrect and reading this as an undetermined rhythm.  May 2015 ECG  normal sinus rhythm at 62 beats per minute.  Right bundle branch block with repolarization changes.  First-degree AV block with PR interval of 252 ms.  Prior October 2014 ECG: Sinus rhythm with right bundle branch block with first-degree AV block.  LABS:  BMET    Component Value Date/Time   NA 140 06/03/2010 0926   K 4.5 06/03/2010 0926   CL 109 06/03/2010 0926   CO2 25 06/03/2010 0756   GLUCOSE 150* 06/03/2010 0926   BUN 32* 06/03/2010 0926   CREATININE 1.6* 06/03/2010 0926   CALCIUM 9.4 06/03/2010 0756   GFRNONAA 37* 06/03/2010 0756   GFRAA * 06/03/2010 0756    45        The eGFR has been calculated using the MDRD equation. This calculation has not been validated in all clinical situations. eGFR's persistently <60 mL/min signify possible Chronic  Kidney Disease.     Hepatic Function Panel     Component Value Date/Time   PROT 7.4 12/06/2009 1413   ALBUMIN 4.5 12/06/2009 1413   AST 17 12/06/2009 1413   ALT 9 12/06/2009 1413   ALKPHOS 87 12/06/2009 1413   BILITOT 0.9 12/06/2009 1413     CBC    Component Value Date/Time   WBC 7.2 06/03/2010 0756   WBC 8.2 12/06/2009 1413   RBC 4.23 06/03/2010 0756   RBC 4.40 12/06/2009 1413   HGB 11.9* 06/03/2010 0926   HGB 14.4 12/06/2009 1413   HCT 35.0* 06/03/2010 0926   HCT 42.0 12/06/2009 1413   PLT 103* 06/03/2010 0756   PLT 133* 12/06/2009 1413   MCV 93.6 06/03/2010 0756   MCV 95.4 12/06/2009 1413   MCH 31.9 06/03/2010 0756   MCH 32.8 12/06/2009 1413   MCHC 34.1 06/03/2010 0756   MCHC 34.4 12/06/2009 1413   RDW 14.3 06/03/2010 0756   RDW 12.8 12/06/2009 1413   LYMPHSABS 1.7 12/06/2009 1413   LYMPHSABS 1.2 04/02/2009 1207   MONOABS 0.3 12/06/2009 1413   MONOABS 0.2 04/02/2009 1207   EOSABS 0.1 12/06/2009 1413   EOSABS 0.0 04/02/2009 1207   BASOSABS 0.0 12/06/2009 1413   BASOSABS 0.1 04/02/2009 1207     BNP No results found for: PROBNP  Lipid Panel  No results found for: CHOL   RADIOLOGY: No results found.    ASSESSMENT AND PLAN: Mr. Hartt has established coronary artery disease with documented midsystolic bridging of the LAD up to 95%, which improved with Ranexa therapy.  He also had mild concomitant CAD as noted above.  He does complain of constipation.  I suggested he try reducing his Ranexa, down to 500 mg twice a day.  His fatigue is  better with discontinuance of his metoprolol.  He was concern, perhaps that his change in stool consistency was due to the Ranexa.  Since he clinically has felt so much better on Ranexa.  I have recommended he continue this therapy as prescribed.  His last catheterization was in January 2012.  He is now been off tobacco for over a year.  He is in sinus rhythm with first-degree AV block and right bundle branch block.  He tells  me Dr. Shelia Media will be recently rechecking laboratory.  His thyroid function needs to be rechecked, particularly with  his constipation.  He is tolerating atorvastatin 40 mg without myalgias.  Target LDL is less than 70.  He takes omeprazole for GERD and this has been fairly well-controlled.  His blood pressure today is stable on lisinopril 20 mg in addition to his low-dose nitrate therapy.  I have recommended in 6 months, he undergo a follow-up nuclear perfusion study to further assess his coronary obstructive disease make certain he is not having any ischemia.  I will see him in the office in follow-up and further recommendations will be made at that time.  Troy Sine, MD, [facility]  07/17/2014 7:53 PM

## 2014-10-09 ENCOUNTER — Other Ambulatory Visit (HOSPITAL_COMMUNITY): Payer: Self-pay | Admitting: Respiratory Therapy

## 2014-10-09 DIAGNOSIS — J441 Chronic obstructive pulmonary disease with (acute) exacerbation: Secondary | ICD-10-CM

## 2014-10-19 ENCOUNTER — Ambulatory Visit (HOSPITAL_COMMUNITY)
Admission: RE | Admit: 2014-10-19 | Discharge: 2014-10-19 | Disposition: A | Discharge status: Home / Self Care | Payer: Medicare Other | Source: Ambulatory Visit | Attending: Internal Medicine | Admitting: Internal Medicine

## 2014-10-19 DIAGNOSIS — J441 Chronic obstructive pulmonary disease with (acute) exacerbation: Secondary | ICD-10-CM | POA: Diagnosis not present

## 2014-10-19 LAB — PULMONARY FUNCTION TEST
DL/VA % pred: 65 %
DL/VA: 3.11 ml/min/mmHg/L
DLCO unc % pred: 56 %
DLCO unc: 19.93 ml/min/mmHg
FEF 25-75 Post: 2.15 L/sec
FEF 25-75 Pre: 1.68 L/sec
FEF2575-%Change-Post: 28 %
FEF2575-%Pred-Post: 88 %
FEF2575-%Pred-Pre: 68 %
FEV1-%Change-Post: 6 %
FEV1-%Pred-Post: 87 %
FEV1-%Pred-Pre: 82 %
FEV1-Post: 2.92 L
FEV1-Pre: 2.74 L
FEV1FVC-%Change-Post: -1 %
FEV1FVC-%Pred-Pre: 95 %
FEV6-%Change-Post: 7 %
FEV6-%Pred-Post: 93 %
FEV6-%Pred-Pre: 87 %
FEV6-Post: 4.07 L
FEV6-Pre: 3.78 L
FEV6FVC-%Change-Post: 0 %
FEV6FVC-%Pred-Post: 101 %
FEV6FVC-%Pred-Pre: 102 %
FVC-%Change-Post: 7 %
FVC-%Pred-Post: 92 %
FVC-%Pred-Pre: 85 %
FVC-Post: 4.24 L
FVC-Pre: 3.93 L
Post FEV1/FVC ratio: 69 %
Post FEV6/FVC ratio: 96 %
Pre FEV1/FVC ratio: 70 %
Pre FEV6/FVC Ratio: 96 %
RV % pred: 140 %
RV: 3.73 L
TLC % pred: 101 %
TLC: 7.54 L

## 2014-10-19 MED ORDER — ALBUTEROL SULFATE (2.5 MG/3ML) 0.083% IN NEBU
2.5000 mg | INHALATION_SOLUTION | Freq: Once | RESPIRATORY_TRACT | Status: AC
  Administered 2014-10-19: 2.5 mg via RESPIRATORY_TRACT

## 2014-10-23 ENCOUNTER — Telehealth (HOSPITAL_COMMUNITY): Payer: Self-pay

## 2014-10-23 NOTE — Telephone Encounter (Signed)
Encounter complete. 

## 2014-10-27 ENCOUNTER — Telehealth (HOSPITAL_COMMUNITY): Payer: Self-pay

## 2014-10-27 NOTE — Telephone Encounter (Signed)
Encounter complete. 

## 2014-10-28 ENCOUNTER — Ambulatory Visit (HOSPITAL_COMMUNITY)
Admission: RE | Admit: 2014-10-28 | Discharge: 2014-10-28 | Disposition: A | Discharge status: Home / Self Care | Payer: Medicare Other | Source: Ambulatory Visit | Attending: Cardiovascular Disease | Admitting: Cardiovascular Disease

## 2014-10-28 ENCOUNTER — Encounter: Payer: Self-pay | Admitting: Cardiovascular Disease

## 2014-10-28 DIAGNOSIS — R931 Abnormal findings on diagnostic imaging of heart and coronary circulation: Secondary | ICD-10-CM | POA: Insufficient documentation

## 2014-10-28 DIAGNOSIS — I2510 Atherosclerotic heart disease of native coronary artery without angina pectoris: Secondary | ICD-10-CM | POA: Diagnosis not present

## 2014-10-28 LAB — MYOCARDIAL PERFUSION IMAGING
Estimated workload: 1 METS
LV dias vol: 58 mL
LV sys vol: 13 mL
Nuc Stress EF: 78 %
Peak BP: 101 mmHg
Peak HR: 69 {beats}/min
Percent of predicted max HR: 47 %
Rest HR: 65 {beats}/min
SDS: 0
SRS: 19
SSS: 19
Stage 1 DBP: 64 mmHg
Stage 1 Grade: 0 %
Stage 1 HR: 65 {beats}/min
Stage 1 SBP: 123 mmHg
Stage 1 Speed: 0 mph
Stage 2 Grade: 0 %
Stage 2 HR: 65 {beats}/min
Stage 2 Speed: 0 mph
Stage 3 DBP: 56 mmHg
Stage 3 Grade: 0 %
Stage 3 HR: 69 {beats}/min
Stage 3 SBP: 101 mmHg
Stage 3 Speed: 0 mph
Stage 4 DBP: 61 mmHg
Stage 4 Grade: 0 %
Stage 4 HR: 72 {beats}/min
Stage 4 SBP: 116 mmHg
Stage 4 Speed: 0 mph
TID: 1.28

## 2014-10-28 MED ORDER — AMINOPHYLLINE 25 MG/ML IV SOLN
75.0000 mg | Freq: Once | INTRAVENOUS | Status: AC
  Administered 2014-10-28: 75 mg via INTRAVENOUS

## 2014-10-28 MED ORDER — REGADENOSON 0.4 MG/5ML IV SOLN
0.4000 mg | Freq: Once | INTRAVENOUS | Status: AC
  Administered 2014-10-28: 0.4 mg via INTRAVENOUS

## 2014-10-28 MED ORDER — TECHNETIUM TC 99M SESTAMIBI GENERIC - CARDIOLITE
31.8000 | Freq: Once | INTRAVENOUS | Status: AC | PRN
  Administered 2014-10-28: 31.8 via INTRAVENOUS

## 2014-10-28 MED ORDER — TECHNETIUM TC 99M SESTAMIBI GENERIC - CARDIOLITE
10.9000 | Freq: Once | INTRAVENOUS | Status: AC | PRN
  Administered 2014-10-28: 10.9 via INTRAVENOUS

## 2014-11-20 ENCOUNTER — Ambulatory Visit
Admission: RE | Admit: 2014-11-20 | Discharge: 2014-11-20 | Disposition: A | Discharge status: Home / Self Care | Payer: Medicare Other | Source: Ambulatory Visit | Attending: Cardiovascular Disease | Admitting: Cardiovascular Disease

## 2014-11-20 ENCOUNTER — Encounter: Payer: Self-pay | Admitting: Cardiovascular Disease

## 2014-11-20 ENCOUNTER — Ambulatory Visit (INDEPENDENT_AMBULATORY_CARE_PROVIDER_SITE_OTHER): Payer: Medicare Other | Admitting: Cardiovascular Disease

## 2014-11-20 ENCOUNTER — Other Ambulatory Visit: Payer: Self-pay | Admitting: *Deleted

## 2014-11-20 VITALS — BP 116/64 | HR 60 | Ht 72.0 in | Wt 189.0 lb

## 2014-11-20 DIAGNOSIS — E039 Hypothyroidism, unspecified: Secondary | ICD-10-CM

## 2014-11-20 DIAGNOSIS — I2583 Coronary atherosclerosis due to lipid rich plaque: Secondary | ICD-10-CM

## 2014-11-20 DIAGNOSIS — I4510 Unspecified right bundle-branch block: Secondary | ICD-10-CM

## 2014-11-20 DIAGNOSIS — Z01818 Encounter for other preprocedural examination: Secondary | ICD-10-CM

## 2014-11-20 DIAGNOSIS — R0600 Dyspnea, unspecified: Secondary | ICD-10-CM

## 2014-11-20 DIAGNOSIS — R0609 Other forms of dyspnea: Secondary | ICD-10-CM

## 2014-11-20 DIAGNOSIS — I440 Atrioventricular block, first degree: Secondary | ICD-10-CM

## 2014-11-20 DIAGNOSIS — I2510 Atherosclerotic heart disease of native coronary artery without angina pectoris: Secondary | ICD-10-CM | POA: Diagnosis not present

## 2014-11-20 DIAGNOSIS — E785 Hyperlipidemia, unspecified: Secondary | ICD-10-CM | POA: Diagnosis not present

## 2014-11-20 MED ORDER — ISOSORBIDE MONONITRATE ER 60 MG PO TB24: 60.0000 mg | ORAL_TABLET | Freq: Every day | ORAL | Status: DC

## 2014-11-20 NOTE — Patient Instructions (Signed)
INCREASE ISOSORBIDE TO 60 MG ONCE DAILY= 2 OF THE 30 MG TABLETS ONCE DAILY  SCHEDULE CATH WITH POSSIBLE INTERVENTION Tuesday 11-24-14 WITH DR [facility]  Your physician has requested that you have a cardiac catheterization. Cardiac catheterization is used to diagnose and/or treat various heart conditions. Doctors may recommend this procedure for a number of different reasons. The most common reason is to evaluate chest pain. Chest pain can be a symptom of coronary artery disease (CAD), and cardiac catheterization can show whether plaque is narrowing or blocking your heart's arteries. This procedure is also used to evaluate the valves, as well as measure the blood flow and oxygen levels in different parts of your heart. For further information please visit [email or web address] Please follow instruction sheet, as given.  Your physician recommends that you HAVE LAB WORK TODAY

## 2014-11-21 ENCOUNTER — Encounter: Payer: Self-pay | Admitting: Cardiovascular Disease

## 2014-11-21 DIAGNOSIS — R0609 Other forms of dyspnea: Secondary | ICD-10-CM | POA: Insufficient documentation

## 2014-11-21 DIAGNOSIS — R0600 Dyspnea, unspecified: Secondary | ICD-10-CM | POA: Insufficient documentation

## 2014-11-21 LAB — COMPREHENSIVE METABOLIC PANEL
ALT: 18 U/L (ref 0–53)
AST: 20 U/L (ref 0–37)
Albumin: 4.1 g/dL (ref 3.5–5.2)
Alkaline Phosphatase: 101 U/L (ref 39–117)
BUN: 20 mg/dL (ref 6–23)
CO2: 25 mEq/L (ref 19–32)
Calcium: 9.4 mg/dL (ref 8.4–10.5)
Chloride: 103 mEq/L (ref 96–112)
Creat: 1.29 mg/dL (ref 0.50–1.35)
Glucose, Bld: 89 mg/dL (ref 70–99)
Potassium: 4.4 mEq/L (ref 3.5–5.3)
Sodium: 143 mEq/L (ref 135–145)
Total Bilirubin: 1 mg/dL (ref 0.2–1.2)
Total Protein: 6.8 g/dL (ref 6.0–8.3)

## 2014-11-21 LAB — CBC
HCT: 42.6 % (ref 39.0–52.0)
Hemoglobin: 14.1 g/dL (ref 13.0–17.0)
MCH: 31.9 pg (ref 26.0–34.0)
MCHC: 33.1 g/dL (ref 30.0–36.0)
MCV: 96.4 fL (ref 78.0–100.0)
MPV: 9.7 fL (ref 8.6–12.4)
Platelets: 128 10*3/uL — ABNORMAL LOW (ref 150–400)
RBC: 4.42 MIL/uL (ref 4.22–5.81)
RDW: 13.8 % (ref 11.5–15.5)
WBC: 6.1 10*3/uL (ref 4.0–10.5)

## 2014-11-21 LAB — TSH: TSH: 2.49 u[IU]/mL (ref 0.350–4.500)

## 2014-11-21 LAB — PROTIME-INR
INR: 1.05 (ref <1.50)
Prothrombin Time: 13.7 seconds (ref 11.6–15.2)

## 2014-11-21 NOTE — Progress Notes (Signed)
Patient ID: Timothy Spencer, male   DOB: 1941/01/03, 74 y.o.   MRN: [id number]      HPI: Timothy Spencer is a 74 y.o. male who presents for follow-up cardiology evaluation.  Chief complaint is that of a dramatic change in exercise tolerance with significant exertional shortness of breath.  Timothy Spencer has a previous long-standing history of tobacco abuse  but quit smoking in May 2012 after smoking for over 40 years. He had undergone cardiac catheterization initially in 2009 and subsequently in January 2012 which showed mild CAD with mid systolic bridging of his LAD which narrowed up to 95% during systole and was essentially normal during diastole, 50-60% circumflex stenosis, 30-40% mid AV groove stenosis and he had a nondominant right coronary artery. He  benefited with the addition of Ranexa to his medical regimen. A cardiopulmonary met test subsequently suggested an ischemic response. He has been exercising regularly. He no longer smokes cigarettes. He has chronic right bundle branch block.   When I saw him in May 2015  he had been taking off his metoprolol due to continued fatigability. He does have a history of hypothyroidism, and he has been on Synthroid 150 mcg.    Laboratory done by Dr. Shelia Media in April 2015 showed a hemoglobin of 14.2, hematocrit 42.0.  BUN 2621.5.  Lipid studies were excellent with a total cholesterol 119 triglycerides were slightly elevated at 179, HDL 44, calculated LDL 39.  Urinalysis was negative.    the last several months, he has noticed a significant change in his ability to exercise.  He has noticed a dramatic increase in exertional shortness of breath.  He underwent palmar a function studies by Dr. Shelia Media which revealed airway obstruction and a diffusion defect suggesting emphysema but the absence of overinflation was inconsistent with that diagnosis. Dr. Shelia Media do not does not believe that his primary function studies are responsible for his abrupt change in exercise  tolerance.   He denies any definitive chest pain.  Last year he can easily walk 1-2 miles. Presently, he has to stop after approximate quarter of a mile due to significant shortness of breath.  He presents for evaluation.  Past Medical History  Diagnosis Date  . Hypertension   . CAD (coronary artery disease)   . Hyperlipidemia   . Hemochromatosis   . H/O ETOH abuse   . Osteopenia   . GERD (gastroesophageal reflux disease)   . Anxiety and depression   . Glaucoma   . Hypothyroidism   . First degree AV block   . Gout   . ED (erectile dysfunction)   . RLS (restless legs syndrome)   . BPH (benign prostatic hyperplasia)   . OSA (obstructive sleep apnea)   . DOE (dyspnea on exertion)   . Peripheral neuropathy   . Thrombocytopenia   . Gynecomastia     Past Surgical History  Procedure Laterality Date  . Cardiac catheterization  03/27/2008    Medical therapy  . Cardiac catheterization  06/03/2010    Medical theray and smoking cessation  . Cardiopulmonary exercise test  02/12/2012    Peak VO2 63% of predicted  . Transthoracic echocardiogram  11/28/2011    EF >55%, normal    No Known Allergies  Current Outpatient Prescriptions  Medication Sig Dispense Refill  . aspirin 81 MG tablet Take 81 mg by mouth daily.    Marland Kitchen atorvastatin (LIPITOR) 40 MG tablet TAKE 1 TABLET BY MOUTH DAILY 90 tablet 2  . isosorbide mononitrate (IMDUR) 60  MG 24 hr tablet Take 1 tablet (60 mg total) by mouth daily. 90 tablet 3  . levothyroxine (SYNTHROID, LEVOTHROID) 150 MCG tablet Take 1 tablet by mouth daily.    Marland Kitchen lisinopril (PRINIVIL,ZESTRIL) 20 MG tablet Take 10 mg by mouth daily.     . Multiple Vitamins-Minerals (ICAPS) TABS Take 1 tablet by mouth 2 (two) times daily.    Marland Kitchen omeprazole (PRILOSEC) 40 MG capsule Take 1 capsule by mouth daily.    Marland Kitchen RANEXA 500 MG 12 hr tablet TAKE 1 TABLET BY MOUTH TWICE DAILY 60 tablet 5  . rOPINIRole (REQUIP) 1 MG tablet Take 1 tablet by mouth daily.    . sertraline (ZOLOFT)  100 MG tablet Take 1 tablet by mouth daily.    . tamsulosin (FLOMAX) 0.4 MG CAPS capsule Take 1 capsule by mouth daily.    . timolol (TIMOPTIC-XR) 0.5 % ophthalmic gel-forming Place 1 drop into both eyes daily.    . zaleplon (SONATA) 10 MG capsule Take 1 capsule by mouth at bedtime.     No current facility-administered medications for this visit.    History   Social History  . Marital Status: Married    Spouse Name: N/A  . Number of Children: N/A  . Years of Education: N/A   Occupational History  . Not on file.   Social History Main Topics  . Smoking status: Former Smoker    Quit date: 03/19/2011  . Smokeless tobacco: Never Used  . Alcohol Use: No  . Drug Use: No  . Sexual Activity: Not on file   Other Topics Concern  . Not on file   Social History Narrative    Family History  Problem Relation Age of Onset  . Adopted: Yes   Additional social history is notable in that he is married has 2 children. He does walk. He quit smoking in 2012. His alcohol use.   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  Shortness of breath Cardiovascular:  See history of present illness GI: Positive for constipation and difficult to wipe his stool; No nausea, vomiting, diarrhea, or abdominal pain GU: Negative; No dysuria, hematuria, or difficulty voiding Musculoskeletal: Negative; no myalgias, joint pain, or weakness Hematologic/Oncology: Negative; no easy bruising, bleeding Endocrine: Positive for hypothyroidism , on Synthroid replacement; no heat/cold intolerance; no diabetes Neuro: Negative; no changes in balance, headaches Skin: Negative; No rashes or skin lesions Psychiatric: Negative; No behavioral problems, depression Sleep: Negative; No snoring, daytime sleepiness, hypersomnolence, bruxism, restless legs,  hypnogognic hallucinations, no cataplexy Other comprehensive 14 point system review is negative.   PE BP 116/64 mmHg  Pulse 60  Ht 6' (1.829 m)  Wt 189 lb (85.73 kg)  BMI 25.63 kg/m2   Wt Readings from Last 3 Encounters:  11/20/14 189 lb (85.73 kg)  10/28/14 187 lb (84.823 kg)  07/15/14 187 lb (84.823 kg)   General: Alert, oriented, no distress.  Skin: normal turgor, no rashes HEENT: Normocephalic, atraumatic. Pupils round and reactive; sclera anicteric;no lid lag.  Nose without nasal septal hypertrophy Mouth/Parynx benign; Mallinpatti scale 3 Neck: No JVD, no carotid bruits.  Normal carotid upstroke.   Lungs: clear to ausculatation and percussion; no wheezing or rales Chest wall: Nontender to palpation Heart: RRR, s1 s2 normal 1/6 systolic murmur.  No diastolic murmur.  No S3, S4, gallop.  No gallops, rubs, thrills or heaves to  Abdomen: soft, nontender; no hepatosplenomehaly, BS+; abdominal aorta nontender and not dilated by palpation. Back is notable for a small lipoma in the right paraspinal region of the mid thoracic area. Pulses 2+ Extremities: no clubbing cyanosis or edema, Homan's sign negative  Neurologic: grossly nonfocal Psychologic: normal affect and mood.  ECG (independently read by me):  Sinus rhythm with first-degree AV block.  Right bundle-branch block.  PR interval 266 ms.  Powell inferior infarct.  February 2016ECG (independently read by me): Sinus rhythm at 64 bpm. ,  First-degree AV block.  Right bundle-branch block with repolarization changes.  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  The computer was incorrect and reading this as an undetermined rhythm.  May 2015 ECG  normal sinus rhythm at 62 beats per minute.  Right bundle branch block with repolarization changes.  First-degree AV block with PR interval of 252 ms.  Prior October 2014 ECG: Sinus rhythm with right bundle branch block with first-degree AV block.  LABS:  BMP Latest Ref Rng 11/20/2014 06/03/2010  06/03/2010  Glucose 70 - 99 mg/dL 89 150(H) 88  BUN 6 - 23 mg/dL 20 32(H) 32(H)  Creatinine 0.50 - 1.35 mg/dL 1.29 1.6(H) 1.80(H)  Sodium 135 - 145 mEq/L 143 140 141  Potassium 3.5 - 5.3 mEq/L 4.4 4.5 4.4  Chloride 96 - 112 mEq/L 103 109 106  CO2 19 - 32 mEq/L 25 - 25  Calcium 8.4 - 10.5 mg/dL 9.4 - 9.4   Hepatic Function Latest Ref Rng 11/20/2014 12/06/2009 06/04/2009  Total Protein 6.0 - 8.3 g/dL 6.8 7.4 6.5  Albumin 3.5 - 5.2 g/dL 4.1 4.5 4.2  AST 0 - 37 U/L $Remo'20 17 25  'fBXWB$ ALT 0 - 53 U/L $Remo'18 9 18  'jUBoo$ Alk Phosphatase 39 - 117 U/L 101 87 96  Total Bilirubin 0.2 - 1.2 mg/dL 1.0 0.9 1.4(H)   CBC Latest Ref Rng 11/20/2014 06/03/2010 06/03/2010  WBC 4.0 - 10.5 K/uL 6.1 - 7.2  Hemoglobin 13.0 - 17.0 g/dL 14.1 11.9(L) 13.5  Hematocrit 39.0 - 52.0 % 42.6 35.0(L) 39.6  Platelets 150 - 400 K/uL 128(L) - 103(L)   Lab Results  Component Value Date   MCV 96.4 11/20/2014   MCV 93.6 06/03/2010   MCV 95.4 12/06/2009   Lab Results  Component Value Date   TSH 2.490 11/20/2014   Lipid Panel  No results found for: CHOL, TRIG, HDL, CHOLHDL, VLDL, LDLCALC, LDLDIRECT   RADIOLOGY: No results found.    ASSESSMENT AND PLAN: Timothy Spencer is a 74 year old white male who has established CAD with documented midsystolic bridging of the LAD.  His last cardiac catheterization was in January 2012 which revealed vigorous left ventricle contractility with LVH.  There was evidence for significant mid LAD, mid systolic bridging with the vessel appeared normal during diastole and narrowing to 95% focally in the intramyocardial segment during systole.  There also is slight progression of previously documented circumflex disease with the proximal AV groove circumflex narrowing to 50-60% and 30-40% narrowing in the AV groove circumflex after the second marginal branch. Symptoms had improved on Ranexa 500 mg twice a day, isosorbide mononitrate 30 mg in addition to his lisinopril 10 mg. His blood pressure today remains stable. He also  is on lipid lowering therapy with atorvastatin 40 mg.  Over the past month, he has noticed that traumatic change in exercise tolerance such that he is hardly able to walk as he had in the past without development of significant exertional dyspnea. His recent pulmonary function studies do  not explain this change. With his change in symptomatology, I am recommending definitive repeat cardiac catheterization. I am increasing his isosorbide to 60 mg daily. Laboratory will be obtained today as well as a chest x-ray with plans for cardiac catheterization to be done on 11/24/2014. The risks and benefits of a cardiac catheterization including, but not limited to, death, stroke, MI, kidney damage and bleeding were discussed with the patient who indicates understanding and agrees to proceed. He is no longer is smoking cigarettes. He continues to have GERD for which he takes omeprazole 40 mg. Target LDL is less than 70 and if his laboratory reveals that he is not at target dose adjustment will be made.  Troy Sine, MD, [facility]  11/21/2014 11:05 AM

## 2014-11-23 ENCOUNTER — Encounter: Payer: Self-pay | Admitting: *Deleted

## 2014-11-23 ENCOUNTER — Other Ambulatory Visit: Payer: Self-pay | Admitting: Cardiovascular Disease

## 2014-11-24 ENCOUNTER — Encounter (HOSPITAL_COMMUNITY)
Admission: RE | Disposition: A | Discharge status: Home / Self Care | Payer: Medicare Other | Source: Ambulatory Visit | Attending: Cardiovascular Disease

## 2014-11-24 ENCOUNTER — Ambulatory Visit (HOSPITAL_COMMUNITY)
Admission: RE | Admit: 2014-11-24 | Discharge: 2014-11-24 | Disposition: A | Discharge status: Home / Self Care | Payer: Medicare Other | Source: Ambulatory Visit | Attending: Cardiovascular Disease | Admitting: Cardiovascular Disease

## 2014-11-24 DIAGNOSIS — I2510 Atherosclerotic heart disease of native coronary artery without angina pectoris: Secondary | ICD-10-CM | POA: Insufficient documentation

## 2014-11-24 DIAGNOSIS — Z01818 Encounter for other preprocedural examination: Secondary | ICD-10-CM

## 2014-11-24 HISTORY — PX: CARDIAC CATHETERIZATION: SHX172

## 2014-11-24 SURGERY — LEFT HEART CATH AND CORONARY ANGIOGRAPHY
Anesthesia: LOCAL

## 2014-11-24 MED ORDER — ASPIRIN 81 MG PO CHEW: 81.0000 mg | CHEWABLE_TABLET | ORAL | Status: DC

## 2014-11-24 MED ORDER — SODIUM CHLORIDE 0.9 % WEIGHT BASED INFUSION: 3.0000 mL/kg/h | INTRAVENOUS | Status: DC

## 2014-11-24 MED ORDER — SODIUM CHLORIDE 0.9 % IV SOLN: 250.0000 mL | INTRAVENOUS | Status: DC | PRN

## 2014-11-24 MED ORDER — SODIUM CHLORIDE 0.9 % IJ SOLN: 3.0000 mL | Freq: Two times a day (BID) | INTRAMUSCULAR | Status: DC

## 2014-11-24 MED ORDER — MIDAZOLAM HCL 2 MG/2ML IJ SOLN
INTRAMUSCULAR | Status: AC
  Filled 2014-11-24: qty 2

## 2014-11-24 MED ORDER — LIDOCAINE HCL (PF) 1 % IJ SOLN
INTRAMUSCULAR | Status: AC
  Filled 2014-11-24: qty 30

## 2014-11-24 MED ORDER — VERAPAMIL HCL 2.5 MG/ML IV SOLN
INTRAVENOUS | Status: AC
  Filled 2014-11-24: qty 2

## 2014-11-24 MED ORDER — NITROGLYCERIN 1 MG/10 ML FOR IR/CATH LAB
INTRA_ARTERIAL | Status: AC
  Filled 2014-11-24: qty 10

## 2014-11-24 MED ORDER — DIAZEPAM 5 MG PO TABS: 5.0000 mg | ORAL_TABLET | ORAL | Status: DC

## 2014-11-24 MED ORDER — FENTANYL CITRATE (PF) 100 MCG/2ML IJ SOLN
INTRAMUSCULAR | Status: AC
  Filled 2014-11-24: qty 2

## 2014-11-24 MED ORDER — HEPARIN SODIUM (PORCINE) 1000 UNIT/ML IJ SOLN
INTRAMUSCULAR | Status: AC
  Filled 2014-11-24: qty 1

## 2014-11-24 MED ORDER — MIDAZOLAM HCL 2 MG/2ML IJ SOLN
INTRAMUSCULAR | Status: DC | PRN
  Administered 2014-11-24: 2 mg via INTRAVENOUS

## 2014-11-24 MED ORDER — SODIUM CHLORIDE 0.9 % IJ SOLN: 3.0000 mL | INTRAMUSCULAR | Status: DC | PRN

## 2014-11-24 MED ORDER — ASPIRIN 81 MG PO CHEW
81.0000 mg | CHEWABLE_TABLET | Freq: Every day | ORAL | Status: DC
Start: 2014-11-25 — End: 2014-11-24

## 2014-11-24 MED ORDER — HEPARIN SODIUM (PORCINE) 1000 UNIT/ML IJ SOLN
INTRAMUSCULAR | Status: DC | PRN
  Administered 2014-11-24: 4200 [IU] via INTRAVENOUS

## 2014-11-24 MED ORDER — SODIUM CHLORIDE 0.9 % IV SOLN
INTRAVENOUS | Status: DC
  Administered 2014-11-24: 08:00:00 via INTRAVENOUS

## 2014-11-24 MED ORDER — FENTANYL CITRATE (PF) 100 MCG/2ML IJ SOLN
INTRAMUSCULAR | Status: DC | PRN
  Administered 2014-11-24: 50 ug via INTRAVENOUS

## 2014-11-24 MED ORDER — ACETAMINOPHEN 325 MG PO TABS: 650.0000 mg | ORAL_TABLET | ORAL | Status: DC | PRN

## 2014-11-24 MED ORDER — HEPARIN (PORCINE) IN NACL 2-0.9 UNIT/ML-% IJ SOLN
INTRAMUSCULAR | Status: AC
  Filled 2014-11-24: qty 1000

## 2014-11-24 MED ORDER — ONDANSETRON HCL 4 MG/2ML IJ SOLN: 4.0000 mg | Freq: Four times a day (QID) | INTRAMUSCULAR | Status: DC | PRN

## 2014-11-24 SURGICAL SUPPLY — 13 items
CATH INFINITI 5FR ANG PIGTAIL (CATHETERS) ×3
CATH INFINITI 5FR MULTPACK ANG (CATHETERS)
CATH OPTITORQUE TIG 4.0 5F (CATHETERS) ×3
DEVICE RAD COMP TR BAND LRG (VASCULAR PRODUCTS) ×3
GLIDESHEATH SLEND A-KIT 6F 22G (SHEATH) ×3
KIT HEART LEFT (KITS) ×3
PACK CARDIAC CATHETERIZATION (CUSTOM PROCEDURE TRAY) ×3
SHEATH PINNACLE 5F 10CM (SHEATH)
SYR MEDRAD MARK V 150ML (SYRINGE) ×3
TRANSDUCER W/STOPCOCK (MISCELLANEOUS) ×3
TUBING CIL FLEX 10 FLL-RA (TUBING) ×3
WIRE EMERALD 3MM-J .035X150CM (WIRE)
WIRE SAFE-T 1.5MM-J .035X260CM (WIRE) ×3

## 2014-11-24 NOTE — Discharge Instructions (Signed)
Radial Site Care °Refer to this sheet in the next few weeks. These instructions provide you with information on caring for yourself after your procedure. Your caregiver may also give you more specific instructions. Your treatment has been planned according to current medical practices, but problems sometimes occur. Call your caregiver if you have any problems or questions after your procedure. °HOME CARE INSTRUCTIONS °· You may shower the day after the procedure. Remove the bandage (dressing) and gently wash the site with plain soap and water. Gently pat the site dry. °· Do not apply powder or lotion to the site. °· Do not submerge the affected site in water for 3 to 5 days. °· Inspect the site at least twice daily. °· Do not flex or bend the affected arm for 24 hours. °· No lifting over 5 pounds (2.3 kg) for 5 days after your procedure. °· Do not drive home if you are discharged the same day of the procedure. Have someone else drive you. °· You may drive 24 hours after the procedure unless otherwise instructed by your caregiver. °· Do not operate machinery or power tools for 24 hours. °· A responsible adult should be with you for the first 24 hours after you arrive home. °What to expect: °· Any bruising will usually fade within 1 to 2 weeks. °· Blood that collects in the tissue (hematoma) may be painful to the touch. It should usually decrease in size and tenderness within 1 to 2 weeks. °SEEK IMMEDIATE MEDICAL CARE IF: °· You have unusual pain at the radial site. °· You have redness, warmth, swelling, or pain at the radial site. °· You have drainage (other than a small amount of blood on the dressing). °· You have chills. °· You have a fever or persistent symptoms for more than 72 hours. °· You have a fever and your symptoms suddenly get worse. °· Your arm becomes pale, cool, tingly, or numb. °· You have heavy bleeding from the site. Hold pressure on the site. °Document Released: 06/17/2010 Document Revised:  08/07/2011 Document Reviewed: 06/17/2010 °ExitCare® Patient Information ©2015 [employer]. This information is not intended to replace advice given to you by your health care provider. Make sure you discuss any questions you have with your health care provider. ° °

## 2014-11-25 ENCOUNTER — Encounter (HOSPITAL_COMMUNITY): Payer: Self-pay | Admitting: Cardiovascular Disease

## 2014-11-25 MED FILL — Lidocaine HCl Local Preservative Free (PF) Inj 1%: INTRAMUSCULAR | Qty: 30 | Status: AC

## 2014-11-25 MED FILL — Nitroglycerin IV Soln 100 MCG/ML in D5W: INTRA_ARTERIAL | Qty: 10 | Status: AC

## 2014-11-25 MED FILL — Heparin Sodium (Porcine) 2 Unit/ML in Sodium Chloride 0.9%: INTRAMUSCULAR | Qty: 1000 | Status: AC

## 2015-01-05 ENCOUNTER — Encounter (HOSPITAL_COMMUNITY): Payer: Medicare Other

## 2015-01-18 ENCOUNTER — Other Ambulatory Visit: Payer: Self-pay | Admitting: Cardiovascular Disease

## 2015-01-18 MED ORDER — ISOSORBIDE MONONITRATE ER 60 MG PO TB24: 60.0000 mg | ORAL_TABLET | Freq: Every day | ORAL | Status: DC

## 2015-01-18 MED ORDER — RANOLAZINE ER 500 MG PO TB12: 500.0000 mg | ORAL_TABLET | Freq: Two times a day (BID) | ORAL | Status: DC

## 2015-01-18 NOTE — Telephone Encounter (Signed)
°  1. Which medications need to be refilled? Isosorbide and Ranexa-new prescription for Isosorbide,dosage have been changed  2. Which pharmacy is medication to be sent to?[facility]-[phone]  3. Do they need a 30 day or 90 day supply? 90 days  and refills  4. Would they like a call back once the medication has been sent to the pharmacy? yes

## 2015-01-18 NOTE — Telephone Encounter (Signed)
Patient identified himself on voicemail; left message informing pt rxs have been sent to the pharmacy, advised to call back if he had any questions

## 2015-01-25 ENCOUNTER — Telehealth: Payer: Self-pay | Admitting: Cardiovascular Disease

## 2015-01-25 MED ORDER — ISOSORBIDE MONONITRATE ER 60 MG PO TB24: 90.0000 mg | ORAL_TABLET | Freq: Every day | ORAL | Status: DC

## 2015-01-25 NOTE — Telephone Encounter (Signed)
Timothy Spencer is calling because he needs a new prescription sent to his pharmacy([facility] at the corner of [address]) [phone] for Isorobide with he new instructions saying that he is supposed to take this one and half per day . Please call

## 2015-01-25 NOTE — Telephone Encounter (Signed)
Per patient, imdur was increased to 90mg  at cath (not documented in EPIC or on discharge summary) and patient has been taking imdur 60mg  x1.5 tablets daily since cath and he is now out of the medication and cannot get refills as his script reads only 1 tablet daily and per insurance it is "too early to refill".  Refill for imudr 60mg  - take 1.5 tabs daily sent in to pharmacy   Message sent to Dr. Tresa Endo to clarify dose

## 2015-01-25 NOTE — Telephone Encounter (Signed)
LM for patient that MD ok'ed refill

## 2015-01-25 NOTE — Telephone Encounter (Signed)
Ok to renew?  

## 2015-02-14 ENCOUNTER — Other Ambulatory Visit: Payer: Self-pay | Admitting: Cardiovascular Disease

## 2015-04-09 ENCOUNTER — Other Ambulatory Visit: Payer: Self-pay | Admitting: Internal Medicine

## 2015-04-09 DIAGNOSIS — R0989 Other specified symptoms and signs involving the circulatory and respiratory systems: Secondary | ICD-10-CM

## 2015-04-19 ENCOUNTER — Ambulatory Visit
Admission: RE | Admit: 2015-04-19 | Discharge: 2015-04-19 | Disposition: A | Discharge status: Home / Self Care | Payer: Medicare Other | Source: Ambulatory Visit | Attending: Internal Medicine | Admitting: Internal Medicine

## 2015-04-19 DIAGNOSIS — R0989 Other specified symptoms and signs involving the circulatory and respiratory systems: Secondary | ICD-10-CM

## 2015-06-17 ENCOUNTER — Encounter: Payer: Self-pay | Admitting: Internal Medicine

## 2015-06-17 ENCOUNTER — Ambulatory Visit (INDEPENDENT_AMBULATORY_CARE_PROVIDER_SITE_OTHER): Payer: Medicare Other | Admitting: Internal Medicine

## 2015-06-17 VITALS — BP 110/70 | HR 68 | Ht 72.0 in | Wt 182.8 lb

## 2015-06-17 DIAGNOSIS — R0602 Shortness of breath: Secondary | ICD-10-CM

## 2015-06-17 DIAGNOSIS — R0609 Other forms of dyspnea: Secondary | ICD-10-CM

## 2015-06-17 DIAGNOSIS — Q791 Other congenital malformations of diaphragm: Secondary | ICD-10-CM

## 2015-06-17 DIAGNOSIS — I10 Essential (primary) hypertension: Secondary | ICD-10-CM

## 2015-06-17 DIAGNOSIS — R0600 Dyspnea, unspecified: Secondary | ICD-10-CM

## 2015-06-17 MED ORDER — VALSARTAN 160 MG PO TABS: 160.0000 mg | ORAL_TABLET | Freq: Every day | ORAL | Status: DC

## 2015-06-17 NOTE — Progress Notes (Signed)
Subjective:     Patient ID: Timothy Spencer, male   DOB: 04/23/1941,     MRN: [id number]  HPI  45 yowm quit smoking 2012 referred to pulmonary clinic 06/17/2015  by Dr Edrick Oh for sob x July 2016    06/17/2015 1st [facility] office visit/ Timothy Spencer   Chief Complaint  Patient presents with  . Pulmonary Consult    Ref by Dr.Pharr for copd. c/o sob x 6 months. Pt brought in his chest xray from 06/09/15.  Denies any cp/tightesnes, wheezing or coughing.Spiriva does not work for pt at all.    onset of problem was relatively abrupt x 6 months and persistent/ not progressive: pattern is doe is no problem on gxt x 3.5 elevation x 1.8 which as fast as he can walk and yet wakes up and can't get to mb and back in am s gasping for air - no better on spiriva   No obvious day to day or daytime variability or assoc chronic cough or cp or chest tightness, subjective wheeze or overt sinus or hb symptoms. No unusual exp hx or h/o childhood pna/ asthma or knowledge of premature birth.  Sleeping ok without nocturnal  or early am exacerbation  of respiratory  c/o's or need for noct saba. Also denies any obvious fluctuation of symptoms with weather or environmental changes or other aggravating or alleviating factors except as outlined above   Current Medications, Allergies, Complete Past Medical History, Past Surgical History, Family History, and Social History were reviewed in [name].  ROS  The following are not active complaints unless bolded sore throat, dysphagia, dental problems, itching, sneezing,  nasal congestion or excess/ purulent secretions, ear ache,   fever, chills, sweats, unintended wt loss, classically pleuritic or exertional cp, hemoptysis,  orthopnea pnd or leg swelling, presyncope, palpitations, abdominal pain, anorexia, nausea, vomiting, diarrhea  or change in bowel or bladder habits, change in stools or urine, dysuria,hematuria,  rash, arthralgias, visual  complaints, headache, numbness, weakness or ataxia or problems with walking or coordination,  change in mood/affect or memory.       Review of Systems     Objective:   Physical Exam    amb wm with voice fatigue  Wt Readings from Last 3 Encounters:  06/17/15 182 lb 12.8 oz (82.918 kg)  11/24/14 185 lb (83.915 kg)  11/20/14 189 lb (85.73 kg)    Vital signs reviewed   HEENT: nl dentition, turbinates, and oropharynx. Nl external ear canals without cough reflex   NECK :  without JVD/Nodes/TM/ nl carotid upstrokes bilaterally   LUNGS: no acc muscle use,  Nl contour chest which is clear to A and P bilaterally without cough on insp or exp maneuvers   CV:  RRR  no s3 or murmur or increase in P2, no edema   ABD:  soft and nontender with nl inspiratory excursion in the supine position. 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

## 2015-06-17 NOTE — Patient Instructions (Addendum)
diovan 160 mg one daily in place of lisinopril   GERD (REFLUX)  is an extremely common cause of respiratory symptoms just like yours , many times with no obvious heartburn at all.    It can be treated with medication, but also with lifestyle changes including elevation of the head of your bed (ideally with 6 inch  bed blocks),  Smoking cessation, avoidance of late meals, excessive alcohol, and avoid fatty foods, chocolate, peppermint, colas, red wine, and acidic juices such as orange juice.  NO MINT OR MENTHOL PRODUCTS SO NO COUGH DROPS  USE SUGARLESS CANDY INSTEAD (Jolley ranchers or Stover's or Life Savers) or even ice chips will also do - the key is to swallow to prevent all throat clearing. NO OIL BASED VITAMINS - use powdered substitutes.    If all better in 6 weeks see Dr Renne Crigler, if not see me Late add: instructed to bring the disc of his most recent cxr when he returns to office

## 2015-06-18 DIAGNOSIS — I10 Essential (primary) hypertension: Secondary | ICD-10-CM | POA: Insufficient documentation

## 2015-06-18 NOTE — Assessment & Plan Note (Addendum)
PFT's  10/19/14  FEV1  2.92 (87%) ratio 69 and dlco 56 correccts to 65 for alv vol/ neg resp to spiriva  - 06/17/2015  Walked RA x 3 laps @ 185 ft each stopped due to  End of study, mild sob no desat - trial off acei 06/17/2015    When respiratory symptoms begin or become refractory well after a patient reports complete smoking cessation,  Especially when this wasn't the case while they were smoking, a red flag is raised based on the work of Dr Primitivo Gauze which states:  if you quit smoking when your best day FEV1 is still well preserved (as his still is even now)  it is highly unlikely you will progress to severe disease.  That is to say, once the smoking stops,  the symptoms should not suddenly erupt or markedly worsen.  If so, the differential diagnosis should include  obesity/deconditioning,  LPR/Reflux/Aspiration syndromes,  occult CHF, or  especially side effects of medications commonly used in this population, esp acei.  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    Please see instructions for details which were reviewed in writing and the patient given a copy highlighting the part that I personally wrote and discussed at today's ov.

## 2015-06-18 NOTE — Assessment & Plan Note (Signed)
In the best review of chronic cough to date ( NEJM 2016 375 [phone]) ,  ACEi are now felt to cause cough in up to  20% of pts which is a 4 fold increase from previous reports and does not include the variety of non-specific complaints we see in pulmonary clinic in pts on ACEi but previously attributed to copd/asthma to include PNDS, throat and chest congestion, "bronchitis", unexplained dyspnea and noct "strangling" sensations as well as atypical /refractory GERD symptoms like atypical dysphagia.   The only way to prove this is not an "ACEi Case" is a trial off ACEi x a minimum of 6 weeks then regroup if not better  Try diovan 160 mg one half daily and f/u with Dr Renne Crigler if all better

## 2015-06-22 ENCOUNTER — Other Ambulatory Visit: Payer: Self-pay | Admitting: Acute Care

## 2015-06-22 DIAGNOSIS — Z87891 Personal history of nicotine dependence: Secondary | ICD-10-CM

## 2015-06-24 ENCOUNTER — Encounter: Payer: Self-pay | Admitting: Internal Medicine

## 2015-06-24 ENCOUNTER — Other Ambulatory Visit: Payer: Self-pay | Admitting: Acute Care

## 2015-06-24 ENCOUNTER — Telehealth: Payer: Self-pay | Admitting: Internal Medicine

## 2015-06-24 DIAGNOSIS — Q791 Other congenital malformations of diaphragm: Secondary | ICD-10-CM | POA: Insufficient documentation

## 2015-06-24 NOTE — Telephone Encounter (Signed)
Pt seen on 06/27/15 by MW  He brought a cxr on a disc that we meant to keep, but it was given back to the pt  Per MW- see if he can come drop this off so he can review it again  Pt has ov with Maralyn Sago for shared decision on 06/28/15  [facility]

## 2015-06-24 NOTE — Assessment & Plan Note (Signed)
Noted on cxr 2003 on R side anteriorly   I did not get to see the cxr disc he brought with him to do an apples to apples comparison with old xrays but I suspect this is the same problem > will attempt to do so at f/u

## 2015-06-25 NOTE — Telephone Encounter (Signed)
LMOM for the pt asking him to please bring disc when he comes in for shared decision making visit on 06/27/14

## 2015-06-28 ENCOUNTER — Ambulatory Visit
Admission: RE | Admit: 2015-06-28 | Discharge: 2015-06-28 | Disposition: A | Discharge status: Home / Self Care | Payer: Medicare Other | Source: Ambulatory Visit | Attending: Acute Care | Admitting: Acute Care

## 2015-06-28 ENCOUNTER — Ambulatory Visit (INDEPENDENT_AMBULATORY_CARE_PROVIDER_SITE_OTHER): Payer: Medicare Other | Admitting: Acute Care

## 2015-06-28 ENCOUNTER — Encounter: Payer: Self-pay | Admitting: Acute Care

## 2015-06-28 DIAGNOSIS — Z87891 Personal history of nicotine dependence: Secondary | ICD-10-CM

## 2015-06-28 NOTE — Telephone Encounter (Signed)
lmtcb

## 2015-06-28 NOTE — Progress Notes (Signed)
Shared Decision Making Visit Lung Cancer Screening Program [phone])   Eligibility:  Age 75 y.o.  Pack Years Smoking History Calculation 45 -pack-year history (# packs/per year x # years smoked)  Recent History of coughing up blood  no  Unexplained weight loss? no ( >Than 15 pounds within the last 6 months )  Prior History Lung / other cancer no (Diagnosis within the last 5 years already requiring surveillance chest CT Scans).  Smoking Status Current Smoker  Former Smokers: Years since quit: Current smoker  Quit Date: Not applicable  Visit Components:  Discussion included one or more decision making aids. yes  Discussion included risk/benefits of screening. yes  Discussion included potential follow up diagnostic testing for abnormal scans. yes  Discussion included meaning and risk of over diagnosis. yes  Discussion included meaning and risk of False Positives. yes  Discussion included meaning of total radiation exposure. yes  Counseling Included:  Importance of adherence to annual lung cancer LDCT screening. yes  Impact of comorbidities on ability to participate in the program. yes  Ability and willingness to under diagnostic treatment. yes  Smoking Cessation Counseling:  Current Smokers:   Discussed importance of smoking cessation. yes  Information about tobacco cessation classes and interventions provided to patient. yes  Patient provided with "ticket" for LDCT Scan. yes  Symptomatic Patient. No  Counseling not applicable  Diagnosis Code: Tobacco Use Z72.0  Asymptomatic Patient yes  Counseling (Intermediate counseling: > three minutes counseling) U0454  Former Smokers:   Discussed the importance of maintaining cigarette abstinence. yes  Diagnosis Code: Personal History of Nicotine Dependence. U98.119  Information about tobacco cessation classes and interventions provided to patient. Yes  Patient provided with "ticket" for LDCT Scan.  yes  Written Order for Lung Cancer Screening with LDCT placed in Epic. Yes (CT Chest Lung Cancer Screening Low Dose W/O CM) [id number] Z12.2-Screening of respiratory organs Z87.891-Personal history of nicotine dependence   I spent 15 minutes of face to face time with [name] discussing the risks and benefits of lung cancer screening. We viewed a power point together that explained in detail the above noted topics. We took the time to pause the power point at intervals to allow for questions to be asked and answered to ensure understanding. We discussed that he had taken the single most powerful action possible to decrease his risk of developing lung cancer when he quit smoking. I counseled him to remain smoke free, and to contact me if he ever had the desire to smoke again so that I can provide resources and tools to help support the effort to remain smoke free. We discussed the time and location of the scan, and that either Jerolyn Shin, CMA or I will call with the results within  24-48 hours of receiving them. He has my card and contact information in the event he needs to speak with me, in addition to a copy of the power point we reviewed as a resource. He verbalized understanding of all of the above and had no further questions upon leaving the office.     Bevelyn Ngo, NP

## 2015-06-29 ENCOUNTER — Telehealth: Payer: Self-pay | Admitting: *Deleted

## 2015-06-29 NOTE — Telephone Encounter (Signed)
-----   Message from Nyoka Cowden, MD sent at 06/29/2015  2:32 PM EST ----- Let him know cxr from 06/09/15 no change from prev

## 2015-06-29 NOTE — Telephone Encounter (Signed)
Spoke with Maralyn Sago, pt did not bring the disc with him to his shared decision making visit. lmtcb x3 for pt.

## 2015-06-29 NOTE — Telephone Encounter (Signed)
LMTCB

## 2015-06-29 NOTE — Telephone Encounter (Signed)
Pt cb to inform us that he will bring by disk this afternoon

## 2015-06-30 NOTE — Telephone Encounter (Signed)
[phone], pt cb

## 2015-06-30 NOTE — Telephone Encounter (Signed)
Called and spoke to pt. Informed him of the results and recs per MW. Pt verbalized understanding and denied any further questions or concerns at this time.  

## 2015-07-22 DIAGNOSIS — Z8719 Personal history of other diseases of the digestive system: Secondary | ICD-10-CM | POA: Diagnosis not present

## 2015-07-22 DIAGNOSIS — J439 Emphysema, unspecified: Secondary | ICD-10-CM | POA: Diagnosis not present

## 2015-08-10 ENCOUNTER — Other Ambulatory Visit: Payer: Self-pay | Admitting: Cardiovascular Disease

## 2015-08-10 NOTE — Telephone Encounter (Signed)
REFILL 

## 2015-08-13 ENCOUNTER — Ambulatory Visit (INDEPENDENT_AMBULATORY_CARE_PROVIDER_SITE_OTHER): Payer: Medicare Other | Admitting: Internal Medicine

## 2015-08-13 ENCOUNTER — Encounter: Payer: Self-pay | Admitting: Internal Medicine

## 2015-08-13 ENCOUNTER — Other Ambulatory Visit (INDEPENDENT_AMBULATORY_CARE_PROVIDER_SITE_OTHER): Payer: Medicare Other

## 2015-08-13 VITALS — BP 118/64 | HR 81 | Ht 72.0 in | Wt 179.6 lb

## 2015-08-13 DIAGNOSIS — J449 Chronic obstructive pulmonary disease, unspecified: Secondary | ICD-10-CM

## 2015-08-13 DIAGNOSIS — R0609 Other forms of dyspnea: Secondary | ICD-10-CM

## 2015-08-13 DIAGNOSIS — R0600 Dyspnea, unspecified: Secondary | ICD-10-CM

## 2015-08-13 DIAGNOSIS — I10 Essential (primary) hypertension: Secondary | ICD-10-CM

## 2015-08-13 LAB — BASIC METABOLIC PANEL
BUN: 26 mg/dL — ABNORMAL HIGH (ref 6–23)
CO2: 23 mEq/L (ref 19–32)
Calcium: 9.6 mg/dL (ref 8.4–10.5)
Chloride: 106 mEq/L (ref 96–112)
Creatinine, Ser: 1.43 mg/dL (ref 0.40–1.50)
GFR: 51.19 mL/min — ABNORMAL LOW (ref >60.00)
Glucose, Bld: 93 mg/dL (ref 70–99)
Potassium: 4 mEq/L (ref 3.5–5.1)
Sodium: 141 mEq/L (ref 135–145)

## 2015-08-13 LAB — CBC WITH DIFFERENTIAL/PLATELET
Basophils Absolute: 0.1 10*3/uL (ref 0.0–0.1)
Basophils Relative: 1.2 % (ref 0.0–3.0)
Eosinophils Absolute: 0.2 10*3/uL (ref 0.0–0.7)
Eosinophils Relative: 2.8 % (ref 0.0–5.0)
HCT: 43.3 % (ref 39.0–52.0)
Hemoglobin: 14.8 g/dL (ref 13.0–17.0)
Lymphocytes Relative: 27.5 % (ref 12.0–46.0)
Lymphs Abs: 2 10*3/uL (ref 0.7–4.0)
MCHC: 34.2 g/dL (ref 30.0–36.0)
MCV: 97 fl (ref 78.0–100.0)
Monocytes Absolute: 0.5 10*3/uL (ref 0.1–1.0)
Monocytes Relative: 7.6 % (ref 3.0–12.0)
Neutro Abs: 4.4 10*3/uL (ref 1.4–7.7)
Neutrophils Relative %: 60.9 % (ref 43.0–77.0)
Platelets: 148 10*3/uL — ABNORMAL LOW (ref 150.0–400.0)
RBC: 4.46 Mil/uL (ref 4.22–5.81)
RDW: 14.2 % (ref 11.5–15.5)
WBC: 7.2 10*3/uL (ref 4.0–10.5)

## 2015-08-13 LAB — BRAIN NATRIURETIC PEPTIDE: Pro B Natriuretic peptide (BNP): 21 pg/mL (ref 0.0–100.0)

## 2015-08-13 LAB — TSH: TSH: 3.7 u[IU]/mL (ref 0.35–4.50)

## 2015-08-13 MED ORDER — TIOTROPIUM BROMIDE-OLODATEROL 2.5-2.5 MCG/ACT IN AERS: 2.0000 | INHALATION_SPRAY | Freq: Every day | RESPIRATORY_TRACT | Status: DC

## 2015-08-13 NOTE — Patient Instructions (Addendum)
Start stiolto 2 puffs each am x one month then return here with it in hand  Please remember to go to the lab department downstairs for your tests - we will call you with the results when they are available.    Please schedule a follow up office visit in 4 weeks, sooner if needed

## 2015-08-13 NOTE — Progress Notes (Signed)
Subjective:     Patient ID: Timothy Spencer, male   DOB: [birth year]     MRN: [id number]    Brief patient profile:  69 yowm quit smoking 2012 referred to pulmonary clinic 06/17/2015  by Dr Renne Crigler for sob x July 2016     History of Present Illness  06/17/2015 1st [facility] office visit/ Timothy Spencer   Chief Complaint  Patient presents with  . Pulmonary Consult    Ref by Dr.Pharr for copd. c/o sob x 6 months. Pt brought in his chest xray from 06/09/15.  Denies any cp/tightesnes, wheezing or coughing.Spiriva does not work for pt at all.    onset of problem was relatively abrupt x 6 months and persistent/ not progressive: pattern is doe is no problem on gxt x 3.5 elevation x 1.8 which as fast as he can walk x 18 minutes  and yet wakes up and can't get to mb and back in am s gasping for air - no better on spiriva dpi  x 2-3 days and stopped rec diovan 160 mg one daily in place of lisinopril  GERD  Diet     08/13/2015  f/u ov/Timothy Spencer re: unexplained sob  Not proportionate to intensity  Chief Complaint  Patient presents with  . Follow-up    pt. states breathing has worsen. increased SOB. denies cough, wheezing or chest pain/tightness  still no problem with gxt but can't walk a block s stopping, had avoided walking at onset of symptoms and had not restarted since  Can't get to mb in am s stopping but not change in ex tol on treadmill.   No obvious day to day or daytime variability or assoc chronic cough or cp or chest tightness, subjective wheeze or overt sinus or hb symptoms. No unusual exp hx or h/o childhood pna/ asthma or knowledge of premature birth.  Sleeping ok without nocturnal  or early am exacerbation  of respiratory  c/o's or need for noct saba. Also denies any obvious fluctuation of symptoms with weather or environmental changes or other aggravating or alleviating factors except as outlined above   Current Medications, Allergies, Complete Past Medical History, Past Surgical History,  Family History, and Social History were reviewed in [name].  ROS  The following are not active complaints unless bolded sore throat, dysphagia, dental problems, itching, sneezing,  nasal congestion or excess/ purulent secretions, ear ache,   fever, chills, sweats, unintended wt loss, classically pleuritic or exertional cp, hemoptysis,  orthopnea pnd or leg swelling, presyncope, palpitations, abdominal pain, anorexia, nausea, vomiting, diarrhea  or change in bowel or bladder habits, change in stools or urine, dysuria,hematuria,  rash, arthralgias, visual complaints, headache, numbness, weakness or ataxia or problems with walking or coordination,  change in mood/affect or memory.             Objective:   Physical Exam    amb wm nad   08/13/2015        180   06/17/15 182 lb 12.8 oz (82.918 kg)  11/24/14 185 lb (83.915 kg)  11/20/14 189 lb (85.73 kg)    Vital signs reviewed   HEENT: nl dentition, turbinates, and oropharynx. Nl external ear canals without cough reflex   NECK :  without JVD/Nodes/TM/ nl carotid upstrokes bilaterally   LUNGS: no acc muscle use,  Nl contour chest which is clear to A and P bilaterally without cough on insp or exp maneuvers   CV:  RRR  no s3 or murmur  or increase in P2, no edema   ABD:  soft and nontender with nl inspiratory excursion in the supine position. 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 Lung-RADS Category 2S, benign appearance or behavior. Continue annual screening  with low-dose chest CT without contrast in 12 months. 2. The "S" modifier above refers to potentially clinically significant non lung cancer related findings. Specifically, there is atherosclerosis, including left main and 2 vessel coronary artery disease. Please note that although the presence of coronary artery calcium documents the presence of coronary artery disease, the severity of this disease and any potential stenosis cannot be assessed on this non-gated CT examination. Assessment for potential risk factor modification, dietary therapy or pharmacologic therapy may be warranted, if clinically indicated. 3. Mild diffuse bronchial wall thickening with mild to moderate centrilobular and paraseptal emphysema; imaging findings suggestive of underlying COPD.     Labs ordered/ reviewed:      Chemistry      Component Value Date/Time   NA 141 08/13/2015 1529   K 4.0 08/13/2015 1529   CL 106 08/13/2015 1529   CO2 23 08/13/2015 1529   BUN 26* 08/13/2015 1529   CREATININE 1.43 08/13/2015 1529   CREATININE 1.29 11/20/2014 1413      Component Value Date/Time   CALCIUM 9.6 08/13/2015 1529   ALKPHOS 101 11/20/2014 1413   AST 20 11/20/2014 1413   ALT 18 11/20/2014 1413   BILITOT 1.0 11/20/2014 1413        Lab Results  Component Value Date   WBC 7.2 08/13/2015   HGB 14.8 08/13/2015   HCT 43.3 08/13/2015   MCV 97.0 08/13/2015   PLT 148.0* 08/13/2015         Lab Results  Component Value Date   TSH 3.70 08/13/2015     Lab Results  Component Value Date   PROBNP 21.0 08/13/2015           Assessment:   Outpatient Encounter Prescriptions as of 08/13/2015  Medication Sig  . aspirin 81 MG tablet Take 81 mg by mouth daily.  Marland Kitchen atorvastatin (LIPITOR) 40 MG tablet TAKE 1 TABLET BY MOUTH DAILY  . cholecalciferol (VITAMIN D) 1000 units tablet Take 1,000 Units by mouth daily.  . isosorbide mononitrate (IMDUR) 60 MG 24 hr tablet Take 1.5 tablets (90 mg total) by mouth  daily.  Marland Kitchen levothyroxine (SYNTHROID, LEVOTHROID) 150 MCG tablet Take 150 mcg by mouth daily.   Marland Kitchen levothyroxine (SYNTHROID, LEVOTHROID) 175 MCG tablet Take 1 tablet by mouth daily.  . Multiple Vitamins-Minerals (ICAPS PO) Take by mouth. Take 4 capsules daily  . ranolazine (RANEXA) 500 MG 12 hr tablet Take 1 tablet (500 mg total) by mouth 2 (two) times daily.  Marland Kitchen rOPINIRole (REQUIP) 1 MG tablet Take 1 mg by mouth 3 (three) times daily.   . sertraline (ZOLOFT) 100 MG tablet Take 100 mg by mouth daily.   . tamsulosin (FLOMAX) 0.4 MG CAPS capsule Take 0.4 mg by mouth daily.   . timolol (TIMOPTIC-XR) 0.5 %  ophthalmic gel-forming Place 1 drop into both eyes daily.  . valsartan (DIOVAN) 160 MG tablet Take 1 tablet (160 mg total) by mouth daily.  . [DISCONTINUED] SPIRIVA RESPIMAT 2.5 MCG/ACT AERS Inhale 2 puffs into the lungs daily.  . [DISCONTINUED] timolol (BETIMOL) 0.5 % ophthalmic solution 1 drop daily.  . Tiotropium Bromide-Olodaterol (STIOLTO RESPIMAT) 2.5-2.5 MCG/ACT AERS Inhale 2 puffs into the lungs daily.   No facility-administered encounter medications on file as of 08/13/2015.

## 2015-08-17 DIAGNOSIS — J449 Chronic obstructive pulmonary disease, unspecified: Secondary | ICD-10-CM | POA: Insufficient documentation

## 2015-08-17 NOTE — Assessment & Plan Note (Signed)
Trial off acei due to unusual pattern of sob 06/17/15   Although even in retrospect it may not be clear the ACEi contributed to the pt's symptoms,  Pt improved off them and adding them back at this point or in the future would risk confusion in interpretation of non-specific respiratory symptoms to which this patient is prone  ie  Better not to muddy the waters here.   Adequate control on present rx, reviewed > no change in rx needed  = diovan 160 mg daily

## 2015-08-17 NOTE — Assessment & Plan Note (Signed)
PFT's  10/19/14  FEV1  2.92 (87%) ratio 69 and dlco 56 correccts to 65 for alv vol/ neg resp to spiriva  - trial of stiolto 08/13/2015 >>>      See ex dyspnea though I strongly doubt copd is the limiting mechanism

## 2015-08-17 NOTE — Assessment & Plan Note (Addendum)
PFT's  10/19/14  FEV1  2.92 (87%) ratio 69 and dlco 56 correccts to 65 for alv vol/ neg resp to spiriva  - 06/17/2015  Walked RA x 3 laps @ 185 ft each stopped due to  End of study, mild sob no desat - trial off acei 06/17/2015  - 08/13/2015  Walked RA x 3 laps @ 185 ft each stopped due to  End of study, nl pace, no sob or desat      - The proper method of use, as well as anticipated side effects, of a metered-dose inhaler are discussed and demonstrated to the patient. Improved effectiveness after extensive coaching during this visit to a level of approximately 75 % from a baseline of 50 %   Reasonable to try stiolto 2 puffs each am  in this setting as does have mild airflow obst with air trapping which theoretically could be worse with high RR whether due to to anxiety or poor conditioning/ control of breathing (easier to do on gxt with slowly graduated increase workload vs mb walking.   I had an extended discussion with the patient reviewing all relevant studies completed to date and  lasting 15 to 20 minutes of a 25 minute visit    Each maintenance medication was reviewed in detail including most importantly the difference between maintenance and prns and under what circumstances the prns are to be triggered using an action plan format that is not reflected in the computer generated alphabetically organized AVS.    Please see instructions for details which were reviewed in writing and the patient given a copy highlighting the part that I personally wrote and discussed at today's ov.

## 2015-08-19 ENCOUNTER — Telehealth: Payer: Self-pay | Admitting: Internal Medicine

## 2015-08-19 DIAGNOSIS — R0600 Dyspnea, unspecified: Secondary | ICD-10-CM

## 2015-08-19 DIAGNOSIS — R0609 Other forms of dyspnea: Secondary | ICD-10-CM

## 2015-08-19 NOTE — Telephone Encounter (Signed)
atc Scott at [facility], was on hold for 12 minutes.  Wcb.

## 2015-08-20 NOTE — Telephone Encounter (Signed)
LMTCB for pt 

## 2015-08-20 NOTE — Telephone Encounter (Signed)
None of these is the same device, so see if we can get him a sample of the stiolto until he returns to regroup and show him how to use one of the alternatives

## 2015-08-20 NOTE — Telephone Encounter (Signed)
Called and spoke with [name] at [facility], verified that Timothy Spencer is denied by insurance- alternatives are advair discus, brio ellipta, serevent discus, anoro ellipta, and symbicort.    MW please advise on alternative for stiolto.  Thanks!

## 2015-08-23 NOTE — Telephone Encounter (Signed)
Left message for patient to call back  

## 2015-08-24 MED ORDER — TIOTROPIUM BROMIDE-OLODATEROL 2.5-2.5 MCG/ACT IN AERS: 2.0000 | INHALATION_SPRAY | Freq: Every day | RESPIRATORY_TRACT | Status: DC

## 2015-08-24 NOTE — Telephone Encounter (Signed)
Spoke with pt and advised of Dr Thurston Hole recommendations.  Sample of Stiolto left at front desk.  Pt has f/u with Dr Sherene Sires 09/03/15.

## 2015-08-24 NOTE — Telephone Encounter (Signed)
Pt is returning call.  

## 2015-08-24 NOTE — Telephone Encounter (Signed)
Left message for patient to call back  

## 2015-08-30 DIAGNOSIS — H353211 Exudative age-related macular degeneration, right eye, with active choroidal neovascularization: Secondary | ICD-10-CM | POA: Diagnosis not present

## 2015-08-30 DIAGNOSIS — H47233 Glaucomatous optic atrophy, bilateral: Secondary | ICD-10-CM | POA: Diagnosis not present

## 2015-09-02 DIAGNOSIS — H353132 Nonexudative age-related macular degeneration, bilateral, intermediate dry stage: Secondary | ICD-10-CM | POA: Diagnosis not present

## 2015-09-03 ENCOUNTER — Encounter: Payer: Self-pay | Admitting: Internal Medicine

## 2015-09-03 ENCOUNTER — Ambulatory Visit (INDEPENDENT_AMBULATORY_CARE_PROVIDER_SITE_OTHER): Payer: Medicare Other | Admitting: Internal Medicine

## 2015-09-03 VITALS — BP 108/56 | HR 68 | Ht 72.0 in | Wt 181.4 lb

## 2015-09-03 DIAGNOSIS — I10 Essential (primary) hypertension: Secondary | ICD-10-CM

## 2015-09-03 DIAGNOSIS — J449 Chronic obstructive pulmonary disease, unspecified: Secondary | ICD-10-CM

## 2015-09-03 DIAGNOSIS — R0609 Other forms of dyspnea: Secondary | ICD-10-CM | POA: Diagnosis not present

## 2015-09-03 DIAGNOSIS — R0600 Dyspnea, unspecified: Secondary | ICD-10-CM

## 2015-09-03 NOTE — Progress Notes (Signed)
Subjective:     Patient ID: Timothy Spencer, male   DOB: March 25, 1941     MRN: [id number]    Brief patient profile:  30 yowm quit smoking 2012 referred to pulmonary clinic 06/17/2015  by Dr Renne Crigler for sob x July 2016     History of Present Illness  06/17/2015 1st  Pulmonary office visit/ Lori Popowski   Chief Complaint  Patient presents with  . Pulmonary Consult    Ref by Dr.Pharr for copd. c/o sob x 6 months. Pt brought in his chest xray from 06/09/15.  Denies any cp/tightesnes, wheezing or coughing.Spiriva does not work for pt at all.    onset of problem was relatively abrupt x 6 months and persistent/ not progressive: pattern is doe is no problem on gxt x 3.5 elevation x 1.8 which as fast as he can walk x 18 minutes  and yet wakes up and can't get to mb and back in am s gasping for air - no better on spiriva dpi  x 2-3 days and stopped rec diovan 160 mg one daily in place of lisinopril  GERD  Diet     08/13/2015  f/u ov/Marlei Glomski re: unexplained sob  Not proportionate to intensity  Chief Complaint  Patient presents with  . Follow-up    pt. states breathing has worsen. increased SOB. denies cough, wheezing or chest pain/tightness  still no problem with gxt but can't walk a block s stopping, had avoided walking at onset of symptoms and had not restarted since  Can't get to mb in am s stopping but not change in ex tol on treadmill but goal of mb and back no better on stiolto (not sure he took it in am before the walk, however)  rec Trial of stiolto and return with it in hand   09/03/2015  f/u ov/Teletha Petrea re: unexplained sob/ did not bring any meds Chief Complaint  Patient presents with  . Follow-up    Pt states that his breathing seems worse since starting Stiolto last visit. Pt c/o incr SOB.  "? Could I have a Hiatal Hernia"  Does have sense of reflux now that did not mention previously and has noted certain foods make both this symptom and his breathing worse but clearly did not improve on  stiolto.  No longer working out on treadmill    No obvious day to day or daytime variability or assoc chronic cough or cp or chest tightness, subjective wheeze or overt sinus or hb symptoms. No unusual exp hx or h/o childhood pna/ asthma or knowledge of premature birth.  Sleeping ok without nocturnal  or early am exacerbation  of respiratory  c/o's or need for noct saba. Also denies any obvious fluctuation of symptoms with weather or environmental changes or other aggravating or alleviating factors except as outlined above   Current Medications, Allergies, Complete Past Medical History, Past Surgical History, Family History, and Social History were reviewed in [name].  ROS  The following are not active complaints unless bolded sore throat, dysphagia, dental problems, itching, sneezing,  nasal congestion or excess/ purulent secretions, ear ache,   fever, chills, sweats, unintended wt loss, classically pleuritic or exertional cp, hemoptysis,  orthopnea pnd or leg swelling, presyncope, palpitations, abdominal pain, anorexia, nausea, vomiting, diarrhea  or change in bowel or bladder habits, change in stools or urine, dysuria,hematuria,  rash, arthralgias, visual complaints, headache, numbness, weakness or ataxia or problems with walking or coordination,  change in mood/affect or memory.  Objective:   Physical Exam    amb somber  wm nad   09/03/2015         181  08/13/2015        180   06/17/15 182 lb 12.8 oz (82.918 kg)  11/24/14 185 lb (83.915 kg)  11/20/14 189 lb (85.73 kg)    Vital signs reviewed   HEENT: nl dentition, turbinates, and oropharynx. Nl external ear canals without cough reflex   NECK :  without JVD/Nodes/TM/ nl carotid upstrokes bilaterally   LUNGS: no acc muscle use,  Nl contour chest which is clear to A and P bilaterally without cough on insp or exp maneuvers   CV:  RRR  no s3 or murmur or increase in P2, no edema    ABD:  soft and nontender with nl inspiratory excursion in the supine position. 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 Lung-RADS Category 2S, benign appearance or behavior. Continue annual screening with low-dose chest CT without contrast in 12 months. 2. The "S" modifier above refers to potentially clinically significant non lung cancer related findings. Specifically, there is atherosclerosis, including left main and 2 vessel coronary artery disease. Please note that although the presence of coronary artery calcium documents the presence of coronary artery disease, the severity of this disease and any potential stenosis cannot be assessed on this non-gated CT examination. Assessment for potential risk factor modification, dietary therapy or pharmacologic therapy may be warranted, if clinically indicated. 3. Mild diffuse bronchial wall thickening with mild to moderate centrilobular and paraseptal emphysema; imaging findings suggestive of underlying COPD.     Labs ordered/ reviewed:      Chemistry      Component Value Date/Time   NA 141 08/13/2015 1529   K 4.0 08/13/2015 1529   CL 106 08/13/2015 1529   CO2 23 08/13/2015 1529   BUN 26* 08/13/2015 1529   CREATININE 1.43 08/13/2015 1529   CREATININE 1.29 11/20/2014 1413      Component Value Date/Time   CALCIUM 9.6 08/13/2015 1529   ALKPHOS 101 11/20/2014 1413   AST 20 11/20/2014 1413   ALT 18 11/20/2014 1413   BILITOT 1.0 11/20/2014 1413        Lab Results  Component Value  Date   WBC 7.2 08/13/2015   HGB 14.8 08/13/2015   HCT 43.3 08/13/2015   MCV 97.0 08/13/2015   PLT 148.0* 08/13/2015         Lab Results  Component Value Date   TSH 3.70 08/13/2015     Lab Results  Component Value Date   PROBNP 21.0 08/13/2015           Assessment:

## 2015-09-03 NOTE — Patient Instructions (Signed)
GERD (REFLUX)  is an extremely common cause of respiratory symptoms just like yours , many times with no obvious heartburn at all.    It can be treated with medication, but also with lifestyle changes including elevation of the head of your bed (ideally with 6 inch  bed blocks),  Smoking cessation, avoidance of late meals, excessive alcohol, and avoid fatty foods, chocolate, peppermint, colas, red wine, and acidic juices such as orange juice.  NO MINT OR MENTHOL PRODUCTS SO NO COUGH DROPS  USE SUGARLESS CANDY INSTEAD (Jolley ranchers or Stover's or Life Savers) or even ice chips will also do - the key is to swallow to prevent all throat clearing. NO OIL BASED VITAMINS - use powdered substitutes.    Take the medication for reflux Take 30- 60 min before your first and last meals of the day (call the office to let us know what it is)  If not satisfied with breathing the next step is a cpst = stress test for heart /muscles/lungs and they all work together - call Libby at [phone] but stay on the reflux medication before the test

## 2015-09-04 NOTE — Assessment & Plan Note (Addendum)
PFT's  10/19/14  FEV1  2.92 (87%) ratio 69 and dlco 56 correccts to 65 for alv vol/ neg resp to spiriva  - 06/17/2015  Walked RA x 3 laps @ 185 ft each stopped due to  End of study, mild sob no desat - trial off acei 06/17/2015  - 08/13/2015  Walked RA x 3 laps @ 185 ft each stopped due to  End of study, nl pace, no sob or desat    - 08/13/2015 trial of stiolto > mp subjective improvement > d/c'd  - max gerd rx  Then cpst rec as of final ov 09/03/2015   I had an extended final summary discussion with the patient reviewing all relevant studies completed to date and  lasting 15 to 20 minutes of a 25 minute visit on the following issues:    1) not clear there is a physiologic explanation for his complaints  2) GERD could be contributing and is hard to exclude as we have no meds for it, just acid suppression for one component, but reasonable to try max diet/ acid suppression  3) next step is cpst > he can call to schedule p max gerd rx x 2 weeks   4) Each maintenance medication was reviewed in detail including most importantly the difference between maintenance and as needed and under what circumstances the prns are to be used.  Please see instructions for details which were reviewed in writing and the patient given a copy.

## 2015-09-04 NOTE — Assessment & Plan Note (Signed)
Adequate control on present rx, reviewed > no change in rx needed  > continue to avoid acei for now

## 2015-09-04 NOTE — Assessment & Plan Note (Signed)
As I explained to this patient in detail:  although there may be copd present, it may not be clinically relevant:   it does not appear to be limiting activity tolerance any more than a set of worn tires limits someone from driving a car  around a parking lot.  A new set of Michelins might look good but would have no perceived impact on the performance of the car and would not be worth the cost.  That is to say:     I don't recommend aggressive pulmonary rx at this point unless limiting symptoms arise or acute exacerbations become as issue, neither of which is the case now.  I asked the patient to contact this office at any time in the future should either of these problems arise.   Pulmonary f/u can be prn

## 2015-09-06 ENCOUNTER — Other Ambulatory Visit: Payer: Self-pay | Admitting: Gastroenterology

## 2015-09-06 DIAGNOSIS — R1310 Dysphagia, unspecified: Secondary | ICD-10-CM | POA: Diagnosis not present

## 2015-09-06 DIAGNOSIS — K449 Diaphragmatic hernia without obstruction or gangrene: Secondary | ICD-10-CM

## 2015-09-09 DIAGNOSIS — R5383 Other fatigue: Secondary | ICD-10-CM | POA: Diagnosis not present

## 2015-09-09 DIAGNOSIS — R0602 Shortness of breath: Secondary | ICD-10-CM | POA: Diagnosis not present

## 2015-09-10 ENCOUNTER — Ambulatory Visit
Admission: RE | Admit: 2015-09-10 | Discharge: 2015-09-10 | Disposition: A | Discharge status: Home / Self Care | Payer: Medicare Other | Source: Ambulatory Visit | Attending: Gastroenterology | Admitting: Gastroenterology

## 2015-09-10 ENCOUNTER — Other Ambulatory Visit (HOSPITAL_COMMUNITY): Payer: Self-pay | Admitting: Respiratory Therapy

## 2015-09-10 DIAGNOSIS — K449 Diaphragmatic hernia without obstruction or gangrene: Secondary | ICD-10-CM | POA: Diagnosis not present

## 2015-09-10 DIAGNOSIS — R1310 Dysphagia, unspecified: Secondary | ICD-10-CM | POA: Diagnosis not present

## 2015-09-10 DIAGNOSIS — R0602 Shortness of breath: Secondary | ICD-10-CM

## 2015-09-10 DIAGNOSIS — K219 Gastro-esophageal reflux disease without esophagitis: Secondary | ICD-10-CM | POA: Diagnosis not present

## 2015-09-13 DIAGNOSIS — R0602 Shortness of breath: Secondary | ICD-10-CM | POA: Diagnosis not present

## 2015-09-17 ENCOUNTER — Ambulatory Visit (HOSPITAL_COMMUNITY)
Admission: RE | Admit: 2015-09-17 | Discharge: 2015-09-17 | Disposition: A | Discharge status: Home / Self Care | Payer: Medicare Other | Source: Ambulatory Visit | Attending: Internal Medicine | Admitting: Internal Medicine

## 2015-09-17 DIAGNOSIS — R0602 Shortness of breath: Secondary | ICD-10-CM | POA: Diagnosis not present

## 2015-09-17 LAB — PULMONARY FUNCTION TEST
DL/VA % pred: 61 %
DL/VA: 2.9 ml/min/mmHg/L
DLCO cor % pred: 52 %
DLCO cor: 18.55 ml/min/mmHg
DLCO unc % pred: 53 %
DLCO unc: 18.65 ml/min/mmHg
FEF 25-75 Post: 1.91 L/s
FEF 25-75 Pre: 1.5 L/s
FEF2575-%Change-Post: 27 %
FEF2575-%Pred-Post: 80 %
FEF2575-%Pred-Pre: 62 %
FEV1-%Change-Post: 5 %
FEV1-%Pred-Post: 83 %
FEV1-%Pred-Pre: 79 %
FEV1-Post: 2.78 L
FEV1-Pre: 2.63 L
FEV1FVC-%Change-Post: 1 %
FEV1FVC-%Pred-Pre: 95 %
FEV6-%Change-Post: 3 %
FEV6-%Pred-Post: 89 %
FEV6-%Pred-Pre: 86 %
FEV6-Post: 3.85 L
FEV6-Pre: 3.72 L
FEV6FVC-%Change-Post: 0 %
FEV6FVC-%Pred-Post: 102 %
FEV6FVC-%Pred-Pre: 103 %
FVC-%Change-Post: 3 %
FVC-%Pred-Post: 87 %
FVC-%Pred-Pre: 84 %
FVC-Post: 3.98 L
FVC-Pre: 3.84 L
Post FEV1/FVC ratio: 70 %
Post FEV6/FVC ratio: 97 %
Pre FEV1/FVC ratio: 69 %
Pre FEV6/FVC Ratio: 97 %
RV % pred: 108 %
RV: 2.9 L
TLC % pred: 89 %
TLC: 6.7 L

## 2015-09-17 MED ORDER — ALBUTEROL SULFATE (2.5 MG/3ML) 0.083% IN NEBU
2.5000 mg | INHALATION_SOLUTION | Freq: Once | RESPIRATORY_TRACT | Status: AC
  Administered 2015-09-17: 2.5 mg via RESPIRATORY_TRACT

## 2015-10-04 ENCOUNTER — Other Ambulatory Visit: Payer: Self-pay | Admitting: Cardiovascular Disease

## 2015-10-04 NOTE — Telephone Encounter (Signed)
Rx Refill

## 2015-10-07 DIAGNOSIS — H353122 Nonexudative age-related macular degeneration, left eye, intermediate dry stage: Secondary | ICD-10-CM | POA: Diagnosis not present

## 2015-10-21 DIAGNOSIS — J449 Chronic obstructive pulmonary disease, unspecified: Secondary | ICD-10-CM | POA: Diagnosis not present

## 2015-11-05 DIAGNOSIS — H401111 Primary open-angle glaucoma, right eye, mild stage: Secondary | ICD-10-CM | POA: Diagnosis not present

## 2015-11-05 DIAGNOSIS — H401122 Primary open-angle glaucoma, left eye, moderate stage: Secondary | ICD-10-CM | POA: Diagnosis not present

## 2015-11-05 DIAGNOSIS — Z961 Presence of intraocular lens: Secondary | ICD-10-CM | POA: Diagnosis not present

## 2015-11-06 ENCOUNTER — Other Ambulatory Visit: Payer: Self-pay | Admitting: Cardiovascular Disease

## 2015-11-08 NOTE — Telephone Encounter (Signed)
Rx request sent to pharmacy.  

## 2015-11-17 ENCOUNTER — Other Ambulatory Visit: Payer: Self-pay | Admitting: Cardiovascular Disease

## 2015-12-08 DIAGNOSIS — Z7982 Long term (current) use of aspirin: Secondary | ICD-10-CM | POA: Diagnosis not present

## 2015-12-08 DIAGNOSIS — R0609 Other forms of dyspnea: Secondary | ICD-10-CM | POA: Diagnosis not present

## 2015-12-08 DIAGNOSIS — F17210 Nicotine dependence, cigarettes, uncomplicated: Secondary | ICD-10-CM | POA: Diagnosis not present

## 2015-12-08 DIAGNOSIS — I2510 Atherosclerotic heart disease of native coronary artery without angina pectoris: Secondary | ICD-10-CM | POA: Diagnosis not present

## 2015-12-08 DIAGNOSIS — J449 Chronic obstructive pulmonary disease, unspecified: Secondary | ICD-10-CM | POA: Diagnosis not present

## 2015-12-08 DIAGNOSIS — R0602 Shortness of breath: Secondary | ICD-10-CM | POA: Diagnosis not present

## 2015-12-08 DIAGNOSIS — K8020 Calculus of gallbladder without cholecystitis without obstruction: Secondary | ICD-10-CM | POA: Diagnosis not present

## 2015-12-08 DIAGNOSIS — I10 Essential (primary) hypertension: Secondary | ICD-10-CM | POA: Diagnosis not present

## 2015-12-08 DIAGNOSIS — E039 Hypothyroidism, unspecified: Secondary | ICD-10-CM | POA: Diagnosis not present

## 2015-12-08 DIAGNOSIS — G2581 Restless legs syndrome: Secondary | ICD-10-CM | POA: Diagnosis not present

## 2015-12-15 DIAGNOSIS — R0609 Other forms of dyspnea: Secondary | ICD-10-CM | POA: Diagnosis not present

## 2015-12-16 DIAGNOSIS — R260 Ataxic gait: Secondary | ICD-10-CM | POA: Diagnosis not present

## 2015-12-20 DIAGNOSIS — R260 Ataxic gait: Secondary | ICD-10-CM | POA: Diagnosis not present

## 2015-12-23 DIAGNOSIS — R260 Ataxic gait: Secondary | ICD-10-CM | POA: Diagnosis not present

## 2015-12-29 DIAGNOSIS — R260 Ataxic gait: Secondary | ICD-10-CM | POA: Diagnosis not present

## 2015-12-29 DIAGNOSIS — R0609 Other forms of dyspnea: Secondary | ICD-10-CM | POA: Diagnosis not present

## 2016-01-12 ENCOUNTER — Other Ambulatory Visit: Payer: Self-pay | Admitting: Cardiovascular Disease

## 2016-01-12 DIAGNOSIS — R260 Ataxic gait: Secondary | ICD-10-CM | POA: Diagnosis not present

## 2016-01-14 ENCOUNTER — Other Ambulatory Visit: Payer: Self-pay | Admitting: Cardiovascular Disease

## 2016-01-14 NOTE — Telephone Encounter (Signed)
Rx request sent to pharmacy.  

## 2016-01-17 ENCOUNTER — Other Ambulatory Visit: Payer: Self-pay | Admitting: Cardiovascular Disease

## 2016-01-17 NOTE — Telephone Encounter (Signed)
Rx request sent to pharmacy.  

## 2016-01-20 ENCOUNTER — Other Ambulatory Visit: Payer: Self-pay | Admitting: Acute Care

## 2016-01-20 DIAGNOSIS — R0609 Other forms of dyspnea: Secondary | ICD-10-CM | POA: Diagnosis not present

## 2016-01-20 DIAGNOSIS — J432 Centrilobular emphysema: Secondary | ICD-10-CM | POA: Diagnosis not present

## 2016-01-20 DIAGNOSIS — E7800 Pure hypercholesterolemia, unspecified: Secondary | ICD-10-CM | POA: Diagnosis not present

## 2016-01-20 DIAGNOSIS — E039 Hypothyroidism, unspecified: Secondary | ICD-10-CM | POA: Diagnosis not present

## 2016-01-20 DIAGNOSIS — Z87891 Personal history of nicotine dependence: Secondary | ICD-10-CM

## 2016-01-26 ENCOUNTER — Other Ambulatory Visit: Payer: Self-pay | Admitting: Cardiovascular Disease

## 2016-01-26 DIAGNOSIS — R260 Ataxic gait: Secondary | ICD-10-CM | POA: Diagnosis not present

## 2016-01-26 NOTE — Telephone Encounter (Signed)
Please call,cocncerning prescription you sent over.

## 2016-01-28 MED ORDER — ISOSORBIDE MONONITRATE ER 60 MG PO TB24: ORAL_TABLET | ORAL | 0 refills | Status: DC

## 2016-01-28 NOTE — Telephone Encounter (Signed)
Previous Rx was sent in with instructions: patient needs appointment or something to that effect.   Rx(s) sent to pharmacy electronically for imdur 90mg  QD See tele note from 01/25/15 for reference regarding dose

## 2016-01-28 NOTE — Telephone Encounter (Signed)
[facility] called for clarification of the sig on the isosorbide rx as below. Please call [phone]. Medication Detail    Disp Refills Start End   isosorbide mononitrate (IMDUR) 60 MG 24 hr tablet 45 tablet 3 01/26/2016    Sig: Patient needs appointment   E-Prescribing Status: Receipt confirmed by pharmacy (01/26/2016 3:15 PM EDT)   Pharmacy   [id number] - [place], [place] - [address] AT [facility] [address]

## 2016-02-09 DIAGNOSIS — R260 Ataxic gait: Secondary | ICD-10-CM | POA: Diagnosis not present

## 2016-02-12 ENCOUNTER — Other Ambulatory Visit: Payer: Self-pay | Admitting: Cardiovascular Disease

## 2016-02-14 ENCOUNTER — Other Ambulatory Visit: Payer: Self-pay | Admitting: Cardiovascular Disease

## 2016-02-14 ENCOUNTER — Other Ambulatory Visit: Payer: Self-pay | Admitting: *Deleted

## 2016-02-14 MED ORDER — ATORVASTATIN CALCIUM 40 MG PO TABS: 40.0000 mg | ORAL_TABLET | Freq: Every day | ORAL | 0 refills | Status: DC

## 2016-02-15 NOTE — Telephone Encounter (Signed)
Rx has been sent to the pharmacy electronically. ° °

## 2016-02-29 DIAGNOSIS — H353112 Nonexudative age-related macular degeneration, right eye, intermediate dry stage: Secondary | ICD-10-CM | POA: Diagnosis not present

## 2016-02-29 DIAGNOSIS — H353122 Nonexudative age-related macular degeneration, left eye, intermediate dry stage: Secondary | ICD-10-CM | POA: Diagnosis not present

## 2016-03-01 DIAGNOSIS — E039 Hypothyroidism, unspecified: Secondary | ICD-10-CM | POA: Diagnosis not present

## 2016-03-01 DIAGNOSIS — I10 Essential (primary) hypertension: Secondary | ICD-10-CM | POA: Diagnosis not present

## 2016-03-01 DIAGNOSIS — R0609 Other forms of dyspnea: Secondary | ICD-10-CM | POA: Diagnosis not present

## 2016-03-01 DIAGNOSIS — I2510 Atherosclerotic heart disease of native coronary artery without angina pectoris: Secondary | ICD-10-CM | POA: Diagnosis not present

## 2016-03-01 DIAGNOSIS — Z7982 Long term (current) use of aspirin: Secondary | ICD-10-CM | POA: Diagnosis not present

## 2016-03-01 DIAGNOSIS — F17210 Nicotine dependence, cigarettes, uncomplicated: Secondary | ICD-10-CM | POA: Diagnosis not present

## 2016-03-13 DIAGNOSIS — R260 Ataxic gait: Secondary | ICD-10-CM | POA: Diagnosis not present

## 2016-03-14 ENCOUNTER — Telehealth: Payer: Self-pay | Admitting: Internal Medicine

## 2016-03-14 DIAGNOSIS — J438 Other emphysema: Secondary | ICD-10-CM

## 2016-03-14 NOTE — Telephone Encounter (Signed)
Order has been placed for the pt to be set up with pulmonary rehab.  Nothing further is needed.  

## 2016-03-14 NOTE — Telephone Encounter (Signed)
Pulmonary Rehab is calling. They are needing/wanting an order to be placed for pulmonary rehab.  MW - please advise if this okay and what diagnosis to use. Thanks.

## 2016-03-14 NOTE — Telephone Encounter (Signed)
Copd/ emphysema

## 2016-03-17 DIAGNOSIS — E039 Hypothyroidism, unspecified: Secondary | ICD-10-CM | POA: Diagnosis not present

## 2016-05-09 DIAGNOSIS — H401122 Primary open-angle glaucoma, left eye, moderate stage: Secondary | ICD-10-CM | POA: Diagnosis not present

## 2016-05-09 DIAGNOSIS — H401111 Primary open-angle glaucoma, right eye, mild stage: Secondary | ICD-10-CM | POA: Diagnosis not present

## 2016-05-24 IMAGING — RF DG ESOPHAGUS
8 of 10 series · 19 of 24 positions shown · non-contrast
Comparison: None.

CLINICAL DATA: Dysphagia.  Hiatal hernia.

EXAM:
ESOPHOGRAM / BARIUM SWALLOW / BARIUM TABLET STUDY
TECHNIQUE: Combined double contrast and single contrast examination performed
using effervescent crystals, thick barium liquid, and thin barium
liquid. The patient was observed with fluoroscopy swallowing a 13 mm
barium sulphate tablet.
FLUOROSCOPY TIME:  Radiation Exposure Index (as provided by the
fluoroscopic device):
If the device does not provide the exposure index:
Fluoroscopy Time:  1 minutes 54 second
Number of Acquired Images:

[Series 1: run · 12 of 24 slices shown (1 of 8)]
[im 1/24]
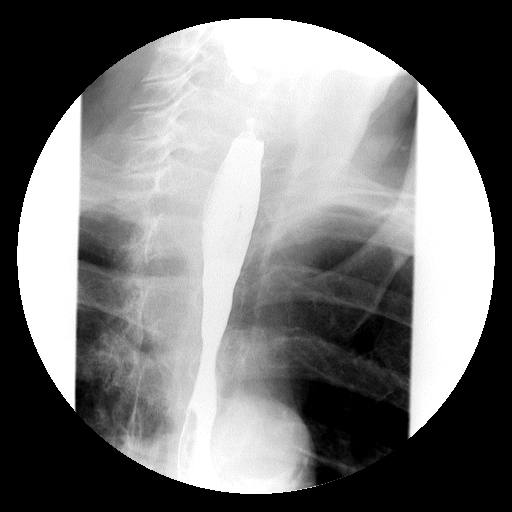
[im 2/24]
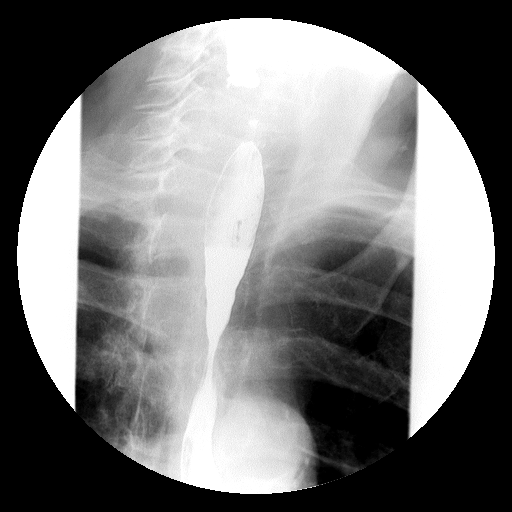
[im 5/24]
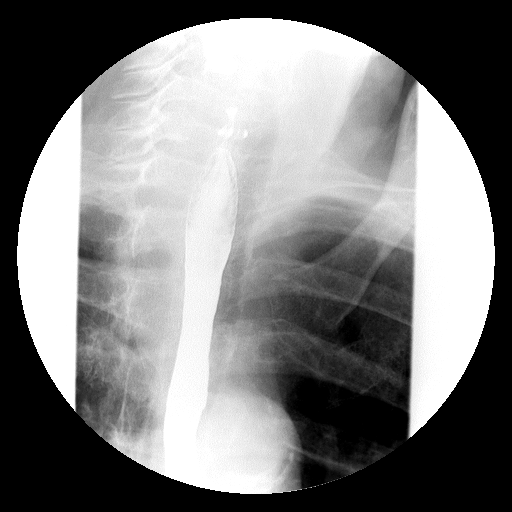
[im 7/24]
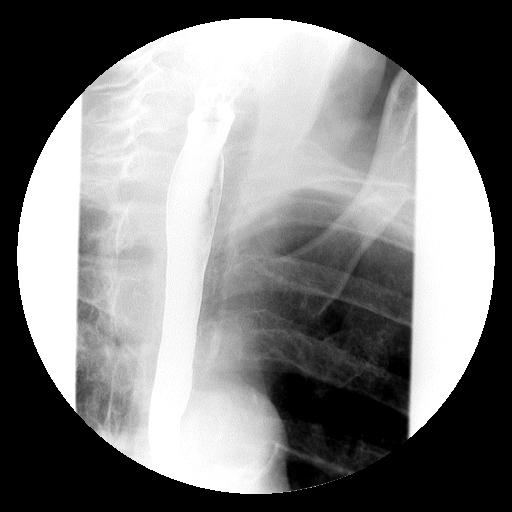
[im 9/24]
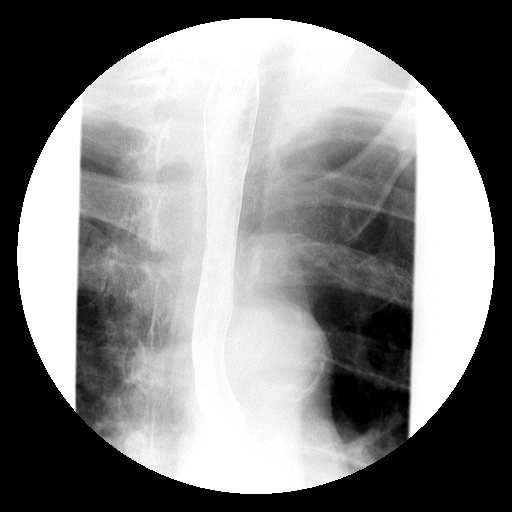
[im 10/24]
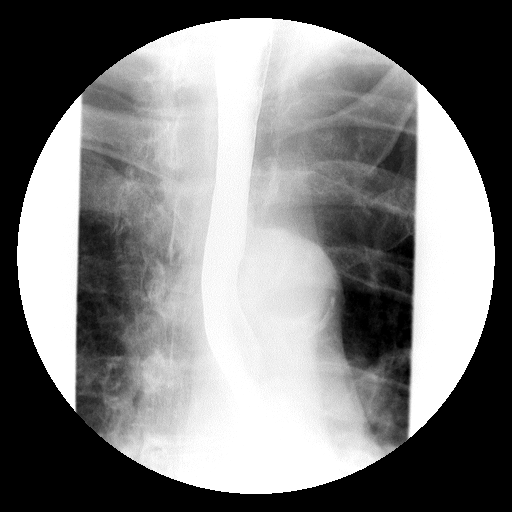
[im 14/24]
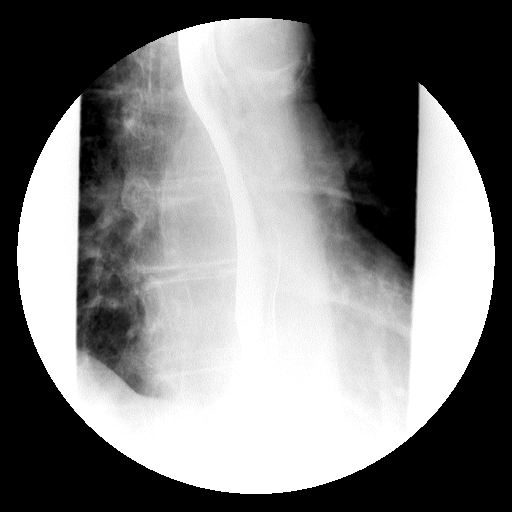
[im 15/24]
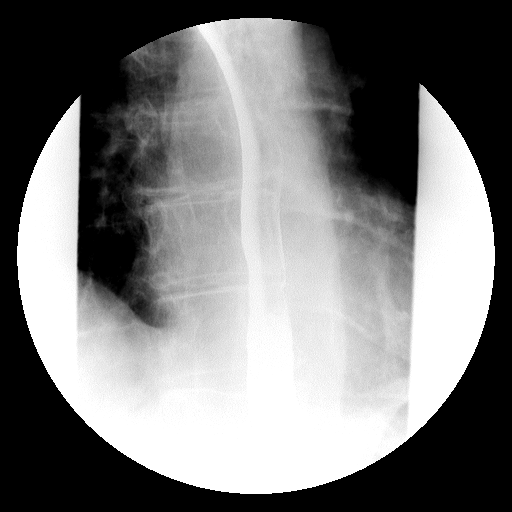
[im 17/24]
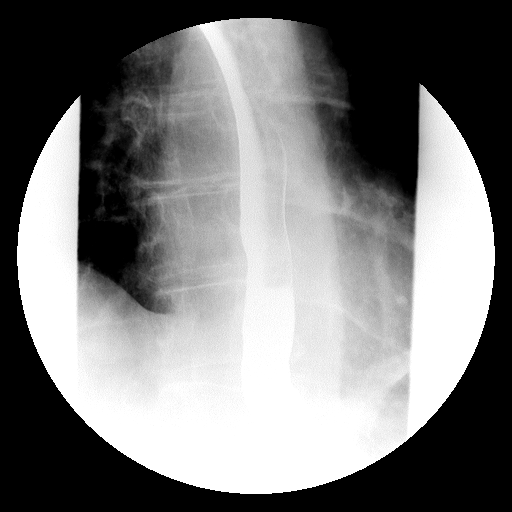
[im 20/24]
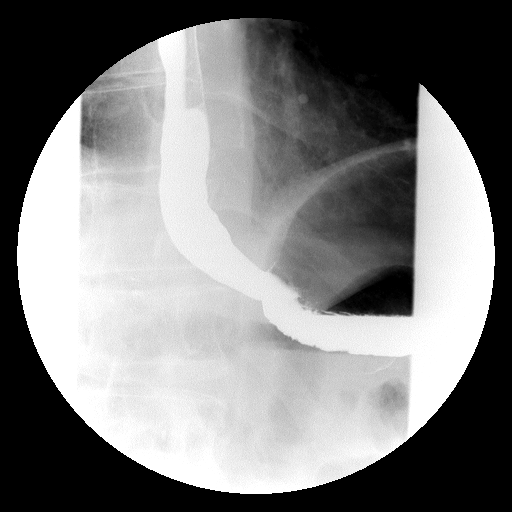
[im 22/24]
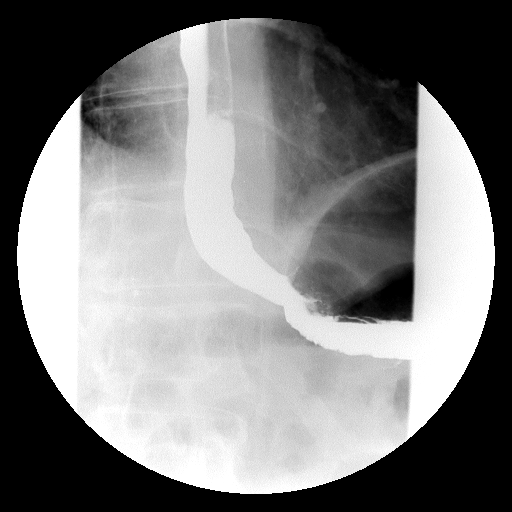
[im 24/24]
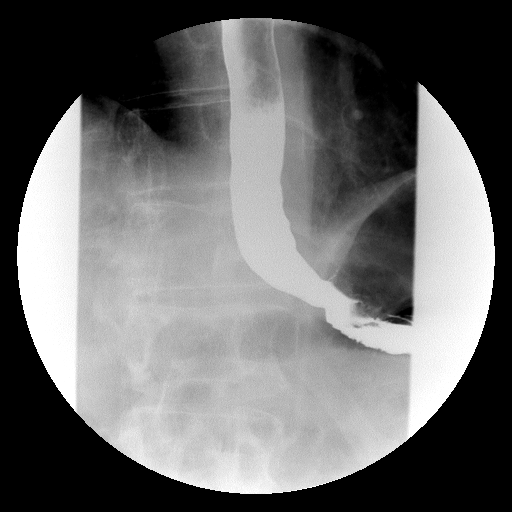

[Series 2: run · 1 of 1 slices shown (2 of 8)]
[im 1/1]
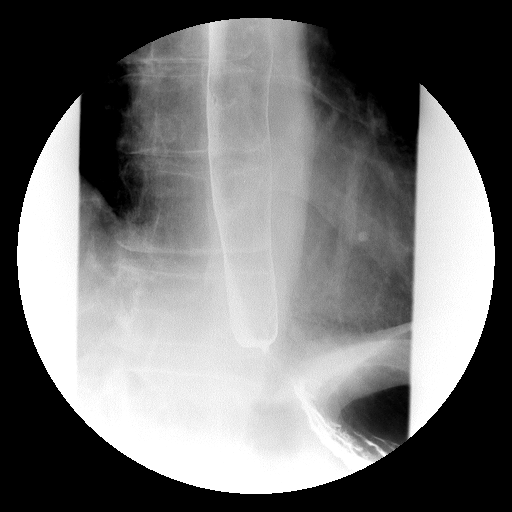

[Series 4: run · 1 of 1 slices shown (3 of 8)]
[im 1/1]
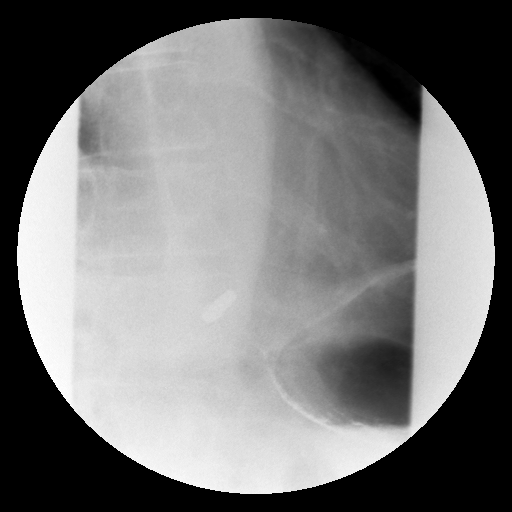

[Series 5: run · 1 of 1 slices shown (4 of 8)]
[im 1/1]
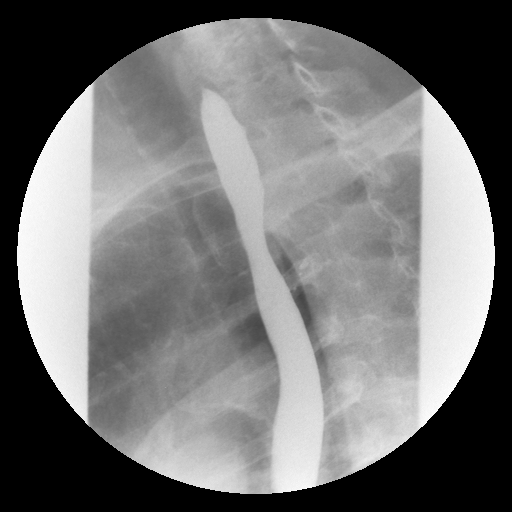

[Series 6: run · 1 of 1 slices shown (5 of 8)]
[im 1/1]
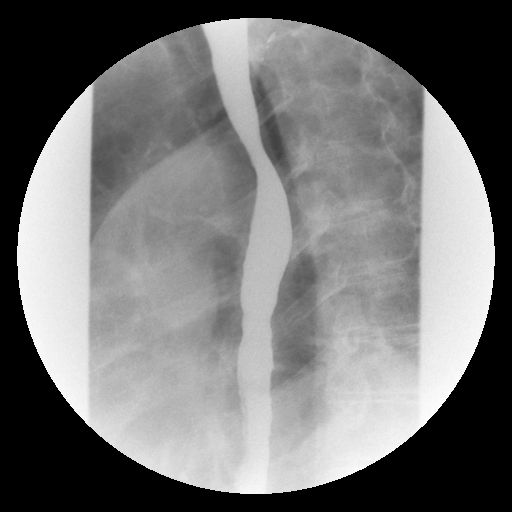

[Series 7: run · 1 of 1 slices shown (6 of 8)]
[im 1/1]
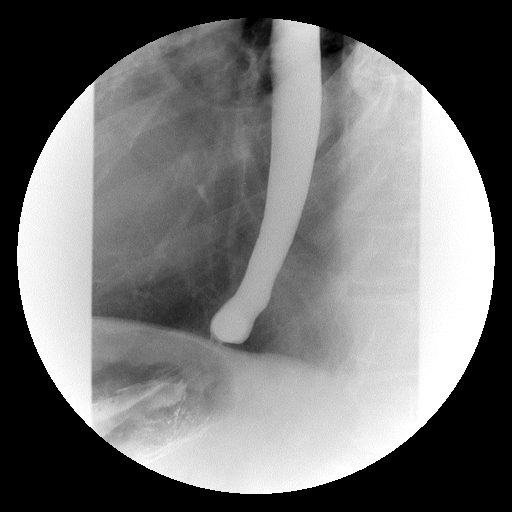

[Series 9: run · 1 of 1 slices shown (7 of 8)]
[im 1/1]
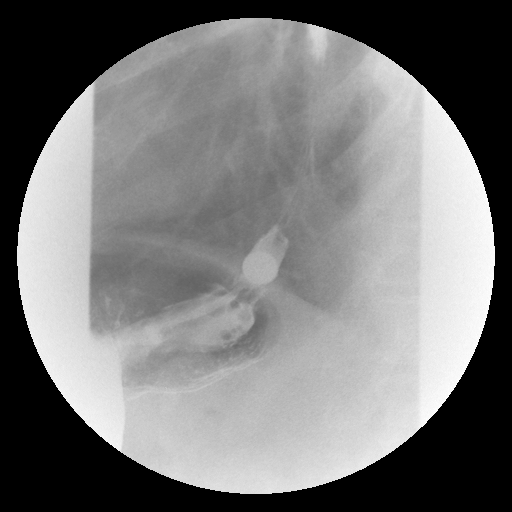

[Series 10: run · 1 of 1 slices shown (8 of 8)]
[im 1/1]
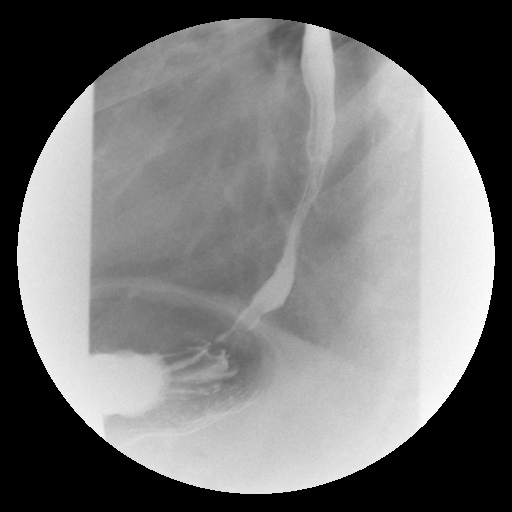

[19 of 24 positions shown; findings below may reference images not displayed]

FINDINGS: Esophageal mucosa and motility normal. Small hiatal hernia. Mild
narrowing at the GE junction. Barium tablet was slow to pass into
the stomach consistent with an mild stricture. Eventually the barium
tablet did pass into the stomach.

Negative for mass lesion.

No reflux identified
IMPRESSION: Small hiatal hernia with mild stricture at the gastroesophageal
junction. Negative for reflux.

## 2016-05-30 DIAGNOSIS — H401111 Primary open-angle glaucoma, right eye, mild stage: Secondary | ICD-10-CM | POA: Diagnosis not present

## 2016-05-30 DIAGNOSIS — H401122 Primary open-angle glaucoma, left eye, moderate stage: Secondary | ICD-10-CM | POA: Diagnosis not present

## 2016-06-12 ENCOUNTER — Other Ambulatory Visit: Payer: Self-pay | Admitting: *Deleted

## 2016-06-12 MED ORDER — ISOSORBIDE MONONITRATE ER 60 MG PO TB24: ORAL_TABLET | ORAL | 0 refills | Status: DC

## 2016-06-12 NOTE — Telephone Encounter (Signed)
Rx(s) sent to pharmacy electronically.  

## 2016-06-16 ENCOUNTER — Telehealth: Payer: Self-pay | Admitting: Cardiovascular Disease

## 2016-06-16 MED ORDER — ISOSORBIDE MONONITRATE ER 60 MG PO TB24: ORAL_TABLET | ORAL | 0 refills | Status: DC

## 2016-06-16 NOTE — Telephone Encounter (Signed)
Rx(s) sent to pharmacy electronically.  

## 2016-06-16 NOTE — Telephone Encounter (Signed)
New Message       *STAT* If patient is at the pharmacy, call can be transferred to refill team.   1. Which medications need to be refilled? (please list name of each medication and dose if known)  isosorbide mononitrate (IMDUR) 60 MG 24 hr tablet TAKE 1 AND 1/2 TABLETS (90 MG) BY MOUTH DAILY. <PLEASE MAKE APPOINTMENT FOR REFILLS>    2. Which pharmacy/location (including street and city if local pharmacy) is medication to be sent to? [facility] on [address]   3. Do they need a 30 day or 90 day supply? 30  He is scheduled for appt for 06/19/16  Azalee Course , he is completely out of medication

## 2016-06-19 ENCOUNTER — Encounter: Payer: Self-pay | Admitting: Physician Assistant

## 2016-06-19 ENCOUNTER — Ambulatory Visit (INDEPENDENT_AMBULATORY_CARE_PROVIDER_SITE_OTHER): Payer: Medicare Other | Admitting: Physician Assistant

## 2016-06-19 VITALS — BP 99/62 | HR 91 | Ht 72.0 in | Wt 178.0 lb

## 2016-06-19 DIAGNOSIS — I10 Essential (primary) hypertension: Secondary | ICD-10-CM | POA: Diagnosis not present

## 2016-06-19 DIAGNOSIS — R42 Dizziness and giddiness: Secondary | ICD-10-CM

## 2016-06-19 DIAGNOSIS — I440 Atrioventricular block, first degree: Secondary | ICD-10-CM

## 2016-06-19 DIAGNOSIS — R0609 Other forms of dyspnea: Secondary | ICD-10-CM

## 2016-06-19 DIAGNOSIS — I4510 Unspecified right bundle-branch block: Secondary | ICD-10-CM

## 2016-06-19 DIAGNOSIS — I2510 Atherosclerotic heart disease of native coronary artery without angina pectoris: Secondary | ICD-10-CM

## 2016-06-19 DIAGNOSIS — E785 Hyperlipidemia, unspecified: Secondary | ICD-10-CM

## 2016-06-19 DIAGNOSIS — R0600 Dyspnea, unspecified: Secondary | ICD-10-CM

## 2016-06-19 DIAGNOSIS — E039 Hypothyroidism, unspecified: Secondary | ICD-10-CM

## 2016-06-19 LAB — CBC WITH DIFFERENTIAL/PLATELET
Basophils Absolute: 0 cells/uL (ref 0–200)
Basophils Relative: 0 %
Eosinophils Absolute: 72 cells/uL (ref 15–500)
Eosinophils Relative: 1 %
HCT: 43.2 % (ref 38.5–50.0)
Hemoglobin: 14.7 g/dL (ref 13.2–17.1)
Lymphocytes Relative: 25 %
Lymphs Abs: 1800 cells/uL (ref 850–3900)
MCH: 32 pg (ref 27.0–33.0)
MCHC: 34 g/dL (ref 32.0–36.0)
MCV: 94.1 fL (ref 80.0–100.0)
MPV: 10.5 fL (ref 7.5–12.5)
Monocytes Absolute: 504 cells/uL (ref 200–950)
Monocytes Relative: 7 %
Neutro Abs: 4824 cells/uL (ref 1500–7800)
Neutrophils Relative %: 67 %
Platelets: 140 10*3/uL (ref 140–400)
RBC: 4.59 MIL/uL (ref 4.20–5.80)
RDW: 14.4 % (ref 11.0–15.0)
WBC: 7.2 10*3/uL (ref 3.8–10.8)

## 2016-06-19 LAB — BASIC METABOLIC PANEL
BUN: 23 mg/dL (ref 7–25)
CO2: 21 mmol/L (ref 20–31)
Calcium: 9.4 mg/dL (ref 8.6–10.3)
Chloride: 107 mmol/L (ref 98–110)
Creat: 1.44 mg/dL — ABNORMAL HIGH (ref 0.70–1.18)
Glucose, Bld: 88 mg/dL (ref 65–99)
Potassium: 3.8 mmol/L (ref 3.5–5.3)
Sodium: 141 mmol/L (ref 135–146)

## 2016-06-19 MED ORDER — VALSARTAN 80 MG PO TABS: 80.0000 mg | ORAL_TABLET | Freq: Every day | ORAL | 3 refills | Status: DC

## 2016-06-19 MED ORDER — ISOSORBIDE MONONITRATE ER 60 MG PO TB24: ORAL_TABLET | ORAL | 3 refills | Status: DC

## 2016-06-19 MED ORDER — ATORVASTATIN CALCIUM 40 MG PO TABS: 40.0000 mg | ORAL_TABLET | Freq: Every day | ORAL | 3 refills | Status: DC

## 2016-06-19 NOTE — Progress Notes (Signed)
Cardiology Office Note    Date:  06/19/2016   ID:  KILLIAN SCHWER, Park Meo Mar 06, 1941, MRN [id number]  PCP:  Londell Moh, MD  Cardiologist:  Dr. Tresa Endo   Chief Complaint  Patient presents with  . Follow-up    seen for Dr. Tresa Endo    History of Present Illness:  Timothy Spencer is a 76 y.o. male with PMH of first degree AV block, HTN, HLD, hypothyroidism, OSA, CAD, RBBB and RLS. He has a long-standing history of tobacco abuse however quit smoking in May 2012 after smoking for 40 years. He undergone cardiac catheterization in 2009 and subsequently January 2012 which showed mild CAD was mid systolic bridging of his LAD narrowed up to 95% during systole and was essentially normal during diastole, 50-60% left circumflex stenosis, 30-40% mid AV groove stenosis, he also has nondominant RCA. He benefited with the addition of Ranexa to his medical regimen. A cardiopulmonary stress test subsequently suggested ischemic response. Due to significant change in his ability to exercise, he was evaluated by pulmonary service, he underwent pulmonary function study which revealed airway obstruction suggesting emphysema however absence overinflation was inconsistent with that diagnosis. It was not believed that his pulmonary function study was responsible for his abrupt change with exercise intolerance. He had a Myoview on 10/28/2014 which was on the normal with large severe fixed inferior defect consistent with prior MI, however there does appears to be inferior wall motion at the same time, this was interpreted as intermediate risk study, EF hyperdynamic > 65%. He was last evaluated in the cardiology office on 11/20/2014, his Imdur was increased to 60 mg daily which has since been increased to 90mg  daily. Given symptom change with exertion and ambiguous myoview result, cardiac catheterization was suggested. Cardiac catheterization performed on 11/16/2014 showed 2 vessel disease with 50% mid left circumflex lesion,  90% mid to distal LAD lesion secondary to dynamic systolic bridging which turn into a 0% during diastole. Medical therapy was recommended.  He presents today for medication management, he says he continued to have dyspnea however has been able to exercise up to 50 minutes 3 times a week. He has since been evaluated at [facility]. He had a echocardiogram obtained on 12/15/2015 showed EF 60-65%, no significant valvular issue. He subsequently had a pulmonary stress test on 12/29/2015 that showed predicted FEV1 66.4%, predicted FVC 72.1%, FEV1 to FVC ratio 67%. On physical exam, he has good air movement in the lung, only mildly diminished, occasional expiratory wheezing. His heart is regular. EKG continued to show right bundle branch block, very prolonged first-degree heart block. I think his dyspnea on exertion is related to age and pulmonary issue, I have encouraged him to continue to exercise. He says he'll occasionally have some dizziness, his blood pressure today is 99/62, I have rechecked the manual blood pressure myself which was 100/56. Some of his recent dizziness is likely related to low blood pressure, I will decrease his valsartan to 40 mg daily. I have encouraged him to monitor blood pressure at home. He will see Dr. Tresa Endo in 4-6 month for reassessment of blood pressure. I did discuss with the patient, I do not recommend any further cardiac workup at this time. I will obtain a CBC and basic metabolic panel to make sure he has no sign of anemia or kidney problem that can explain his recent symptoms.   Past Medical History:  Diagnosis Date  . Anxiety and depression   . BPH (benign  prostatic hyperplasia)   . CAD (coronary artery disease)   . DOE (dyspnea on exertion)   . ED (erectile dysfunction)   . First degree AV block   . GERD (gastroesophageal reflux disease)   . Glaucoma   . Gout   . Gynecomastia   . H/O ETOH abuse   . Hemochromatosis   . Hyperlipidemia   .  Hypertension   . Hypothyroidism   . OSA (obstructive sleep apnea)   . Osteopenia   . Peripheral neuropathy (HCC)   . RLS (restless legs syndrome)   . Thrombocytopenia (HCC)     Past Surgical History:  Procedure Laterality Date  . CARDIAC CATHETERIZATION  03/27/2008   Medical therapy  . CARDIAC CATHETERIZATION  06/03/2010   Medical theray and smoking cessation  . CARDIAC CATHETERIZATION N/A 11/24/2014   Procedure: Left Heart Cath and Coronary Angiography;  Surgeon: Lennette Bihari, MD;  Location: [facility] INVASIVE CV LAB;  Service: Cardiovascular;  Laterality: N/A;  . CARDIOPULMONARY EXERCISE TEST  02/12/2012   Peak VO2 63% of predicted  . TRANSTHORACIC ECHOCARDIOGRAM  11/28/2011   EF >55%, normal    Current Medications: Outpatient Medications Prior to Visit  Medication Sig Dispense Refill  . aspirin 81 MG tablet Take 81 mg by mouth daily.    . cholecalciferol (VITAMIN D) 1000 units tablet Take 1,000 Units by mouth daily.    Marland Kitchen levothyroxine (SYNTHROID, LEVOTHROID) 175 MCG tablet Take 1 tablet by mouth daily.  5  . rOPINIRole (REQUIP) 1 MG tablet Take 1 mg by mouth 3 (three) times daily.     . sertraline (ZOLOFT) 100 MG tablet Take 100 mg by mouth daily.     . tamsulosin (FLOMAX) 0.4 MG CAPS capsule Take 0.4 mg by mouth daily.     Marland Kitchen atorvastatin (LIPITOR) 40 MG tablet Take 1 tablet (40 mg total) by mouth daily. 90 tablet 0  . atorvastatin (LIPITOR) 40 MG tablet Take 1 tablet (40 mg total) by mouth daily. Need appointment before anymore refills 30 tablet 0  . isosorbide mononitrate (IMDUR) 60 MG 24 hr tablet TAKE 1 AND 1/2 TABLETS (90 MG) BY MOUTH DAILY. 45 tablet 0  . Multiple Vitamins-Minerals (ICAPS PO) Take by mouth. Reported on 09/03/2015    . RANEXA 500 MG 12 hr tablet TAKE 1 TABLET(500 MG) BY MOUTH TWICE DAILY 180 tablet 0  . timolol (TIMOPTIC-XR) 0.5 % ophthalmic gel-forming Place 1 drop into both eyes daily.    . Tiotropium Bromide-Olodaterol (STIOLTO RESPIMAT) 2.5-2.5 MCG/ACT AERS  Inhale 2 puffs into the lungs daily. 1 Inhaler 0  . valsartan (DIOVAN) 160 MG tablet Take 1 tablet (160 mg total) by mouth daily. 30 tablet 11   No facility-administered medications prior to visit.      Allergies:   Patient has no known allergies.   Social History   Social History  . Marital status: Married    Spouse name: N/A  . Number of children: N/A  . Years of education: N/A   Social History Main Topics  . Smoking status: Former Smoker    Packs/day: 1.50    Years: 30.00    Types: Cigarettes    Quit date: 03/19/2011  . Smokeless tobacco: Never Used  . Alcohol use No  . Drug use: No  . Sexual activity: Not Asked   Other Topics Concern  . None   Social History Narrative  . None     Family History:  The patient's family history is not on file. He was  adopted.   ROS:   Please see the history of present illness.    ROS All other systems reviewed and are negative.   PHYSICAL EXAM:   VS:  BP 99/62   Pulse 91   Ht 6' (1.829 m)   Wt 178 lb (80.7 kg)   BMI 24.14 kg/m    GEN: Well nourished, well developed, in no acute distress  HEENT: normal  Neck: no JVD, carotid bruits, or masses Cardiac: RRR; no murmurs, rubs, or gallops,no edema  Respiratory:  clear to auscultation bilaterally, normal work of breathing GI: soft, nontender, nondistended, + BS MS: no deformity or atrophy  Skin: warm and dry, no rash Neuro:  Alert and Oriented x 3, Strength and sensation are intact Psych: euthymic mood, full affect  Wt Readings from Last 3 Encounters:  06/19/16 178 lb (80.7 kg)  09/03/15 181 lb 6.4 oz (82.3 kg)  08/13/15 179 lb 9.6 oz (81.5 kg)      Studies/Labs Reviewed:   EKG:  EKG is ordered today.  The ekg ordered today demonstrates Sinus rhythm with prolonged first-degree heart block, right bundle branch block  Recent Labs: 08/13/2015: BUN 26; Creatinine, Ser 1.43; Hemoglobin 14.8; Platelets 148.0; Potassium 4.0; Pro B Natriuretic peptide (BNP) 21.0; Sodium 141;  TSH 3.70   Lipid Panel No results found for: CHOL, TRIG, HDL, CHOLHDL, VLDL, LDLCALC, LDLDIRECT  Additional studies/ records that were reviewed today include:    Myoview 10/28/2014 Study Highlights    Myocardial perfusion is abnormal. There is a large, severe fixed inferior defect. Findings consistent with prior myocardial infarction, however, there does appear to be inferior wall motion. This is an intermediate risk study. Overall left ventricular systolic function was normal. LV cavity size is normal. The left ventricular ejection fraction is hyperdynamic (>65%). There is no prior study for comparison.  Clinical correlation is advised      Cath 11/24/2014 Conclusion    Mid Cx lesion, 50% stenosed.  Mid LAD to Dist LAD lesion, 90% stenosed.   Normal LV function.  2 vessel coronary obstructive disease with dynamic systolic bridging in a mid LAD intramyocardial segment which narrows to 80-90% during systole and 0% during diastole; and 50% mid AV groove circumflex stenosis and a left dominant circumflex system.  RECOMMENDATION:  Medical therapy.     PFT 09/10/2015 Conclusions: Although there is airway obstruction and a diffusion defect suggesting emphysema, the absence of overinflation is inconsistent with that diagnosis. In view of the severity of the diffusion defect, studies with exercise would be helpful to evaluate the presence of hypoxemia. Pulmonary Function Diagnosis: Minimal Obstructive Airways Disease Moderately severe Diffusion Defect Insignificant response to bronchodilator Normal lung volumes   ASSESSMENT:    1. Dizziness   2. 1st degree AV block   3. Essential hypertension   4. Hyperlipidemia, unspecified hyperlipidemia type   5. Hypothyroidism, unspecified type   6. RBBB   7. Coronary artery disease involving native coronary artery of native heart without angina pectoris      PLAN:  In order of problems listed above:  1. Dizziness: He has  been having some dizziness recently especially during exercise. His blood pressure today is borderline low, I think that may have contributed to his recent dizziness. I have decreased his valsartan to 40 mg daily.  2. DOE: He continued to have dyspnea on exertion for the past 2 years. Echocardiogram obtained at [facility] in July 2017, normal EF without significant valvular issue. He does have decreased FEV1 and  FVC ratio on pulmonary stress test at [facility]. I think his dyspnea on exertion likely is related to his age and also pulmonary issue.  3. CAD: Minimal CAD with mid LAD myocardial bridging on previous cardiac catheterization. He is currently on 90 mg daily of Imdur and has not had any recent chest pain. He continued to exercise 50 minutes 3 times weekly.  4. Hypertension: Pressure borderline today, decrease valsartan to 40 mg daily  5. Hyperlipidemia: Continue 40 mg Lipitor daily  6. Hypothyroidism: On Synthroid    Medication Adjustments/Labs and Tests Ordered: Current medicines are reviewed at length with the patient today.  Concerns regarding medicines are outlined above.  Medication changes, Labs and Tests ordered today are listed in the Patient Instructions below. Patient Instructions  Medication Instructions:  DECREASE VALSARTAN TO 40 MG DAILY   Labwork: CBC/BMET AT [facility] ON THE FIRST FLOOR  Testing/Procedures: NONE  Follow-Up: Your physician wants you to follow-up in: 4-6 MONTH OV WITH DR Tresa Endo You will receive a reminder letter in the mail two months in advance. If you don't receive a letter, please call our office to schedule the follow-up appointment.   If you need a refill on your cardiac medications before your next appointment, please call your pharmacy.     Ramond Dial, [place]  06/19/2016 7:59 PM    [facility] [address] [place], [place], [place]  [address] Phone: [phone]; Fax: [phone]

## 2016-06-19 NOTE — Patient Instructions (Signed)
Medication Instructions:  DECREASE VALSARTAN TO 40 MG DAILY   Labwork: CBC/BMET AT [facility] ON THE FIRST FLOOR  Testing/Procedures: NONE  Follow-Up: Your physician wants you to follow-up in: 4-6 MONTH OV WITH DR Tresa Endo You will receive a reminder letter in the mail two months in advance. If you don't receive a letter, please call our office to schedule the follow-up appointment.   If you need a refill on your cardiac medications before your next appointment, please call your pharmacy.

## 2016-06-22 ENCOUNTER — Telehealth: Payer: Self-pay | Admitting: Physician Assistant

## 2016-06-22 NOTE — Telephone Encounter (Signed)
Spoke to patient regarding results. Voiced understanding.  He also wanted to give his commendation regarding his care received at our office. Was very thankful for Hao's time and care in explaining things to him at his appointment the other day. He just wanted this acknowledgment passed along to provider team.

## 2016-06-22 NOTE — Telephone Encounter (Signed)
New message ° ° ° ° °Returning a call to the nurse to get lab results °

## 2016-06-22 NOTE — Telephone Encounter (Signed)
Acknowledged.

## 2016-06-28 ENCOUNTER — Ambulatory Visit: Payer: Medicare Other

## 2016-07-13 ENCOUNTER — Telehealth: Payer: Self-pay | Admitting: Acute Care

## 2016-07-13 ENCOUNTER — Ambulatory Visit
Admission: RE | Admit: 2016-07-13 | Discharge: 2016-07-13 | Disposition: A | Discharge status: Home / Self Care | Payer: Medicare Other | Source: Ambulatory Visit | Attending: Acute Care | Admitting: Acute Care

## 2016-07-13 DIAGNOSIS — Z87891 Personal history of nicotine dependence: Secondary | ICD-10-CM

## 2016-07-13 NOTE — Telephone Encounter (Signed)
SG  Danielle from CT called and wanted to make sure you were able to see this pt. CT report. I looked in epic and was able to see it

## 2016-07-17 NOTE — Telephone Encounter (Signed)
I have called [name] with the results of his low-dose screening CT. I explained that his scan was read as a Lung RADS 4 A : suspicious findings, either short term follow up in 3 months or alternatively  PET Scan evaluation may be considered when there is a solid component of  8 mm or larger. I explained that I have reviewed his scan with Dr. Levy Pupa, and he feels it is best to repeat the scan in 3 months. I explained that we will schedule him for a scan in May 2018. I also explained that we will fax a copy of the report to his primary care physician. He verbalized understanding, and requested that we send a copy of the report to him at his home. I confirmed address and we'll send a copy to his home. [name] had no further questions at completion of the call.

## 2016-07-26 DIAGNOSIS — R197 Diarrhea, unspecified: Secondary | ICD-10-CM | POA: Diagnosis not present

## 2016-07-26 DIAGNOSIS — I10 Essential (primary) hypertension: Secondary | ICD-10-CM | POA: Diagnosis not present

## 2016-08-23 DIAGNOSIS — R197 Diarrhea, unspecified: Secondary | ICD-10-CM | POA: Diagnosis not present

## 2016-08-23 DIAGNOSIS — I10 Essential (primary) hypertension: Secondary | ICD-10-CM | POA: Diagnosis not present

## 2016-08-29 DIAGNOSIS — H353132 Nonexudative age-related macular degeneration, bilateral, intermediate dry stage: Secondary | ICD-10-CM | POA: Diagnosis not present

## 2016-09-01 DIAGNOSIS — J449 Chronic obstructive pulmonary disease, unspecified: Secondary | ICD-10-CM | POA: Diagnosis not present

## 2016-09-01 DIAGNOSIS — E039 Hypothyroidism, unspecified: Secondary | ICD-10-CM | POA: Diagnosis not present

## 2016-09-01 DIAGNOSIS — R918 Other nonspecific abnormal finding of lung field: Secondary | ICD-10-CM | POA: Diagnosis not present

## 2016-09-29 DIAGNOSIS — R918 Other nonspecific abnormal finding of lung field: Secondary | ICD-10-CM | POA: Diagnosis not present

## 2016-09-29 DIAGNOSIS — E7800 Pure hypercholesterolemia, unspecified: Secondary | ICD-10-CM | POA: Diagnosis not present

## 2016-09-29 DIAGNOSIS — I10 Essential (primary) hypertension: Secondary | ICD-10-CM | POA: Diagnosis not present

## 2016-09-29 DIAGNOSIS — Z0000 Encounter for general adult medical examination without abnormal findings: Secondary | ICD-10-CM | POA: Diagnosis not present

## 2016-09-29 DIAGNOSIS — E039 Hypothyroidism, unspecified: Secondary | ICD-10-CM | POA: Diagnosis not present

## 2016-10-04 DIAGNOSIS — K219 Gastro-esophageal reflux disease without esophagitis: Secondary | ICD-10-CM | POA: Diagnosis not present

## 2016-10-04 DIAGNOSIS — J432 Centrilobular emphysema: Secondary | ICD-10-CM | POA: Diagnosis not present

## 2016-10-04 DIAGNOSIS — R748 Abnormal levels of other serum enzymes: Secondary | ICD-10-CM | POA: Diagnosis not present

## 2016-10-04 DIAGNOSIS — Z0001 Encounter for general adult medical examination with abnormal findings: Secondary | ICD-10-CM | POA: Diagnosis not present

## 2016-10-11 ENCOUNTER — Ambulatory Visit
Admission: RE | Admit: 2016-10-11 | Discharge: 2016-10-11 | Disposition: A | Discharge status: Home / Self Care | Payer: Medicare Other | Source: Ambulatory Visit | Attending: Acute Care | Admitting: Acute Care

## 2016-10-11 DIAGNOSIS — Z87891 Personal history of nicotine dependence: Secondary | ICD-10-CM

## 2016-10-16 ENCOUNTER — Other Ambulatory Visit: Payer: Self-pay | Admitting: Acute Care

## 2016-10-16 DIAGNOSIS — Z87891 Personal history of nicotine dependence: Secondary | ICD-10-CM

## 2016-10-19 ENCOUNTER — Encounter: Payer: Self-pay | Admitting: Cardiovascular Disease

## 2016-10-19 ENCOUNTER — Ambulatory Visit (INDEPENDENT_AMBULATORY_CARE_PROVIDER_SITE_OTHER): Payer: Medicare Other | Admitting: Cardiovascular Disease

## 2016-10-19 VITALS — BP 102/52 | HR 61 | Ht 72.0 in | Wt 169.0 lb

## 2016-10-19 DIAGNOSIS — I2510 Atherosclerotic heart disease of native coronary artery without angina pectoris: Secondary | ICD-10-CM

## 2016-10-19 DIAGNOSIS — E039 Hypothyroidism, unspecified: Secondary | ICD-10-CM

## 2016-10-19 DIAGNOSIS — I10 Essential (primary) hypertension: Secondary | ICD-10-CM | POA: Diagnosis not present

## 2016-10-19 DIAGNOSIS — I2583 Coronary atherosclerosis due to lipid rich plaque: Secondary | ICD-10-CM | POA: Diagnosis not present

## 2016-10-19 DIAGNOSIS — I4510 Unspecified right bundle-branch block: Secondary | ICD-10-CM

## 2016-10-19 DIAGNOSIS — I440 Atrioventricular block, first degree: Secondary | ICD-10-CM

## 2016-10-19 DIAGNOSIS — E785 Hyperlipidemia, unspecified: Secondary | ICD-10-CM

## 2016-10-19 DIAGNOSIS — R0609 Other forms of dyspnea: Secondary | ICD-10-CM

## 2016-10-19 DIAGNOSIS — R0600 Dyspnea, unspecified: Secondary | ICD-10-CM

## 2016-10-19 MED ORDER — VALSARTAN 80 MG PO TABS: 40.0000 mg | ORAL_TABLET | Freq: Every day | ORAL | 3 refills | Status: DC

## 2016-10-19 NOTE — Progress Notes (Signed)
Patient ID: Timothy Spencer, male   DOB: 1940/06/17, 76 y.o.   MRN: [id number]      HPI: Timothy Spencer is a 76 y.o. male who presents for a 2 year follow-up cardiology evaluation.    Timothy Spencer has a previous long-standing history of tobacco abuse  but quit smoking in May 2012 after smoking for over 40 years. He had undergone cardiac catheterization initially in 2009 and subsequently in January 2012 which showed mild CAD with mid systolic bridging of his LAD which narrowed up to 95% during systole and was essentially normal during diastole, 50-60% circumflex stenosis, 30-40% mid AV groove stenosis and he had a nondominant right coronary artery. He  benefited with the addition of Ranexa to his medical regimen. A cardiopulmonary met test subsequently suggested an ischemic response. He has been exercising regularly. He no longer smokes cigarettes. He has chronic right bundle branch block.   When I saw him in May 2015  he had been taking off his metoprolol due to continued fatigability. He does have a history of hypothyroidism, and he has been on Synthroid 150 mcg.    Laboratory done by Dr. Shelia Media in April 2015 showed a hemoglobin of 14.2, hematocrit 42.0.  BUN 2621.5.  Lipid studies were excellent with a total cholesterol 119 triglycerides were slightly elevated at 179, HDL 44, calculated LDL 39.  Urinalysis was negative.   When I last saw him 2 years ago  He noticed a significant change in his ability to exercise.  He has noticed a dramatic increase in exertional shortness of breath.  He underwent palmar a function studies by Dr. Shelia Media which revealed airway obstruction and a diffusion defect suggesting emphysema but the absence of overinflation was inconsistent with that diagnosis.  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  Because of his significant symptom change.  He underwent definitive  catheterization on 11/16/2014 which showed two-vessel disease with 50% mid circumflex stenosis, and there was a 90% mid distal LAD lesion secondary to dynamic systolic bridging which normalized during diastole.  Increased medical therapy was recommended.  He has been evaluated at [facility] in a pulmonary function studies.  He was seen in the office by Almyra Deforest, PA January 2018 with low blood pressure.  He recently has been without chest pain.  His shortness of breath has improved.  He has been exercising at least 4 days per week.  At times there is some mild dizziness if he stands abruptly.  He presents for evaluation.  Past Medical History:  Diagnosis Date  . Anxiety and depression   . BPH (benign prostatic hyperplasia)   . CAD (coronary artery disease)   . DOE (dyspnea on exertion)   . ED (erectile dysfunction)   . First degree AV block   . GERD (gastroesophageal reflux disease)   . Glaucoma   . Gout   . Gynecomastia   . H/O ETOH abuse   . Hemochromatosis   . Hyperlipidemia   . Hypertension   . Hypothyroidism   . OSA (obstructive sleep apnea)   . Osteopenia   . Peripheral neuropathy   . RLS (restless legs syndrome)   . Thrombocytopenia ([place])     Past Surgical History:  Procedure Laterality Date  . CARDIAC CATHETERIZATION  03/27/2008   Medical therapy  . CARDIAC CATHETERIZATION  06/03/2010   Medical theray and smoking cessation  . CARDIAC  CATHETERIZATION N/A 11/24/2014   Procedure: Left Heart Cath and Coronary Angiography;  Surgeon: Troy Sine, MD;  Location: [facility];  Service: Cardiovascular;  Laterality: N/A;  . CARDIOPULMONARY EXERCISE TEST  02/12/2012   Peak VO2 63% of predicted  . TRANSTHORACIC ECHOCARDIOGRAM  11/28/2011   EF >55%, normal    No Known Allergies  Current Outpatient Prescriptions  Medication Sig Dispense Refill  . aspirin 81 MG tablet Take 81 mg by mouth daily.    . cholecalciferol (VITAMIN D) 1000 units tablet Take 1,000 Units by mouth  daily.    . isosorbide mononitrate (IMDUR) 60 MG 24 hr tablet TAKE 1 AND 1/2 TABLETS (90 MG) BY MOUTH DAILY. 135 tablet 3  . levothyroxine (SYNTHROID, LEVOTHROID) 175 MCG tablet Take 1 tablet by mouth daily.  5  . Multiple Vitamins-Minerals (ICAPS MV PO) Take 1 tablet by mouth daily.    . ranitidine (ZANTAC) 150 MG tablet Take 150 mg by mouth 2 (two) times daily.    Marland Kitchen rOPINIRole (REQUIP) 1 MG tablet Take 1 mg by mouth 3 (three) times daily.     . rosuvastatin (CRESTOR) 20 MG tablet Take 20 mg by mouth daily.  0  . sertraline (ZOLOFT) 100 MG tablet Take 100 mg by mouth daily.     . tamsulosin (FLOMAX) 0.4 MG CAPS capsule Take 0.4 mg by mouth daily.     . valsartan (DIOVAN) 80 MG tablet Take 0.5 tablets (40 mg total) by mouth daily. 90 tablet 3   No current facility-administered medications for this visit.     Social History   Social History  . Marital status: Married    Spouse name: N/A  . Number of children: N/A  . Years of education: N/A   Occupational History  . Not on file.   Social History Main Topics  . Smoking status: Former Smoker    Packs/day: 1.50    Years: 30.00    Types: Cigarettes    Quit date: 03/19/2011  . Smokeless tobacco: Never Used  . Alcohol use No  . Drug use: No  . Sexual activity: Not on file   Other Topics Concern  . Not on file   Social History Narrative  . No narrative on file    Family History  Problem Relation Age of Onset  . Adopted: Yes   Additional social history is notable in that he is married has 2 children. He does walk. He quit smoking in 2012. His alcohol use.   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  Shortness of breath Cardiovascular:  See history of present illness GI: Positive for constipation and difficult to wipe his stool; No nausea, vomiting,  diarrhea, or abdominal pain GU: Negative; No dysuria, hematuria, or difficulty voiding Musculoskeletal: Negative; no myalgias, joint pain, or weakness Hematologic/Oncology: Negative; no easy bruising, bleeding Endocrine: Positive for hypothyroidism , on Synthroid replacement; no heat/cold intolerance; no diabetes Neuro: Negative; no changes in balance, headaches Skin: Negative; No rashes or skin lesions Psychiatric: Negative; No behavioral problems, depression Sleep: Negative; No snoring, daytime sleepiness, hypersomnolence, bruxism, restless legs, hypnogognic hallucinations, no cataplexy Other comprehensive 14 point system review is negative.   PE BP (!) 102/52   Pulse 61   Ht 6' (1.829 m)   Wt 169 lb (76.7 kg)   BMI 22.92 kg/m    Repeat blood  pressure by me 106/60 supine and 92/58 standing.  Wt Readings from Last 3 Encounters:  10/19/16 169 lb (76.7 kg)  06/19/16 178 lb (80.7 kg)  09/03/15 181 lb 6.4 oz (82.3 kg)   General: Alert, oriented, no distress.  Skin: normal turgor, no rashes, warm and dry HEENT: Normocephalic, atraumatic. Pupils equal round and reactive to light; sclera anicteric; extraocular muscles intact;  Nose without nasal septal hypertrophy Mouth/Parynx benign; Mallinpatti scale 3 Neck: No JVD, no carotid bruits; normal carotid upstroke Lungs: Decreased breath sounds, no wheezing. Chest wall: without tenderness to palpitation Heart: PMI not displaced, RRR, s1 s2 normal, no S3 gallop.  1/6 systolic murmur, no diastolic murmur, no rubs, gallops, thrills, or heaves Abdomen: soft, nontender; no hepatosplenomehaly, BS+; abdominal aorta nontender and not dilated by palpation. Back: no CVA tenderness; Small lipoma.  A paraspinal region. Pulses 2+ Musculoskeletal: full range of motion, normal strength, no joint deformities Extremities: no clubbing cyanosis or edema, Homan's sign negative  Neurologic: grossly nonfocal; Cranial nerves grossly wnl Psychologic: Normal  mood and affect   ECG (independently read by me): Normal sinus rhythm with first-degree AV block at 61 bpm.  PR interval 260 ms.  Right bundle-branch block with repolarization changes.  June 2016 ECG (independently read by me):  Sinus rhythm with first-degree AV block.  Right bundle-branch block.  PR interval 266 ms. inferior infarct.  February 2016ECG (independently read by me): Sinus rhythm at 64 bpm. ,  First-degree AV block.  Right bundle-branch block with repolarization changes.  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  The computer was incorrect and reading this as an undetermined rhythm.  May 2015 ECG  normal sinus rhythm at 62 beats per minute.  Right bundle branch block with repolarization changes.  First-degree AV block with PR interval of 252 ms.  Prior October 2014 ECG: Sinus rhythm with right bundle branch block with first-degree AV block.  LABS:  BMP Latest Ref Rng & Units 06/19/2016 08/13/2015 11/20/2014  Glucose 65 - 99 mg/dL 88 93 89  BUN 7 - 25 mg/dL 23 26(H) 20  Creatinine 0.70 - 1.18 mg/dL 1.44(H) 1.43 1.29  Sodium 135 - 146 mmol/L 141 141 143  Potassium 3.5 - 5.3 mmol/L 3.8 4.0 4.4  Chloride 98 - 110 mmol/L 107 106 103  CO2 20 - 31 mmol/L '21 23 25  '$ Calcium 8.6 - 10.3 mg/dL 9.4 9.6 9.4   Hepatic Function Latest Ref Rng & Units 11/20/2014 12/06/2009 06/04/2009  Total Protein 6.0 - 8.3 g/dL 6.8 7.4 6.5  Albumin 3.5 - 5.2 g/dL 4.1 4.5 4.2  AST 0 - 37 U/L '20 17 25  '$ ALT 0 - 53 U/L '18 9 18  '$ Alk Phosphatase 39 - 117 U/L 101 87 96  Total Bilirubin 0.2 - 1.2 mg/dL 1.0 0.9 1.4(H)   CBC Latest Ref Rng & Units 06/19/2016 08/13/2015 11/20/2014  WBC 3.8 - 10.8 K/uL 7.2 7.2 6.1  Hemoglobin 13.2 - 17.1 g/dL 14.7 14.8 14.1  Hematocrit 38.5 - 50.0 % 43.2 43.3 42.6  Platelets 140 - 400 K/uL 140 148.0(L) 128(L)   Lab Results  Component Value Date   MCV 94.1 06/19/2016   MCV 97.0 08/13/2015   MCV 96.4 11/20/2014   Lab Results  Component Value Date   TSH 3.70 08/13/2015   Lipid Panel  No  results found for: CHOL, TRIG, HDL, CHOLHDL, VLDL, LDLCALC, LDLDIRECT   RADIOLOGY: No results found.  IMPRESSION:  1. Coronary artery disease due to lipid rich plaque   2. Essential hypertension  3. DOE (dyspnea on exertion)   4. Hyperlipidemia, unspecified hyperlipidemia type   5. Hypothyroidism, unspecified type   6. RBBB   7. 1st degree AV block     ASSESSMENT AND PLAN: Timothy Spencer is a 76 year old white male who has established CAD with documented midsystolic bridging of the LAD.  Catheterization in January 2012  revealed vigorous left ventricle contractility with LVH.  There was evidence for significant mid LAD, mid systolic bridging with the vessel appeared normal during diastole and narrowing to 95% focally in the intramyocardial segment during systole.  There was slight progression of previously documented circumflex disease with the proximal AV groove circumflex narrowing to 50-60% and 30-40% narrowing in the AV groove circumflex after the second marginal branch.  Also significant change in symptomatology 2016 with increasing shortness of breath and an equivocal nuclear study.  Repeat catheterization was done on 11/24/2014.  This essentially was unchanged and showed again dynamic systolic bridging in the mid LAD intramyocardial segment, which now at 80-90% during systole and was 0%.  During diastole.  The mid AV groove circumflex had 50% stenosis in a dominant left circumflex system.  Over the last several years, he has done well.  He has undergone pulmonary evaluation at [facility] and has reduced FEV1 and FVC ratio.  His blood pressure today is low with a 14 mm orthostatic drop.  He has been taking valsartan 80 mg daily and I suggested he reduce this to 40 mg.  His blood pressure continues to be very low.  This may need to be discontinued.  He continues to take Crestor for hyperlipidemia with target LDL less than 70.  He takes  Isosorbide with his dynamic systolic bridging.  He has  chronic right bundle branch block with first-degree AV block, which is stable He has a history of hypothyroidism on levothyroxine 125 g.  He quit smoking in 2012.  He has lung disease which seems to have stabilized.  He will be seeing Dr. Shelia Media in the near future.  I will see him in 6 months for cardiology reevaluation.   Troy Sine, MD, [facility]  10/21/2016 3:32 PM

## 2016-10-19 NOTE — Patient Instructions (Signed)
Your physician has recommended you make the following change in your medication:   1.) the valsartan has been changed to 1/2 tablet daily. ( 40 mg)  Your physician wants you to follow-up in: 6 months or sooner if needed. You will receive a reminder letter in the mail two months in advance. If you don't receive a letter, please call our office to schedule the follow-up appointment.  If you need a refill on your cardiac medications before your next appointment, please call your pharmacy.

## 2016-10-27 DIAGNOSIS — Z961 Presence of intraocular lens: Secondary | ICD-10-CM | POA: Diagnosis not present

## 2016-10-27 DIAGNOSIS — H40013 Open angle with borderline findings, low risk, bilateral: Secondary | ICD-10-CM | POA: Diagnosis not present

## 2016-10-27 DIAGNOSIS — H40053 Ocular hypertension, bilateral: Secondary | ICD-10-CM | POA: Diagnosis not present

## 2016-11-16 DIAGNOSIS — J432 Centrilobular emphysema: Secondary | ICD-10-CM | POA: Diagnosis not present

## 2017-01-15 ENCOUNTER — Telehealth: Payer: Self-pay | Admitting: Internal Medicine

## 2017-01-15 DIAGNOSIS — R0609 Other forms of dyspnea: Secondary | ICD-10-CM | POA: Diagnosis not present

## 2017-01-15 DIAGNOSIS — E7800 Pure hypercholesterolemia, unspecified: Secondary | ICD-10-CM | POA: Diagnosis not present

## 2017-01-15 DIAGNOSIS — I2510 Atherosclerotic heart disease of native coronary artery without angina pectoris: Secondary | ICD-10-CM | POA: Diagnosis not present

## 2017-01-15 DIAGNOSIS — J449 Chronic obstructive pulmonary disease, unspecified: Secondary | ICD-10-CM | POA: Diagnosis not present

## 2017-01-15 NOTE — Telephone Encounter (Signed)
Left message for patient to call back tomorrow morning.  

## 2017-01-16 NOTE — Telephone Encounter (Signed)
lmtcb x1 for pt. 

## 2017-01-16 NOTE — Telephone Encounter (Signed)
lmomtcb x 1 for the pt 

## 2017-01-17 ENCOUNTER — Telehealth: Payer: Self-pay | Admitting: Internal Medicine

## 2017-01-17 NOTE — Telephone Encounter (Signed)
I can see the patient but will be leaving the practice. It would be more appropriate to have him see another provider instead of me. J.

## 2017-01-17 NOTE — Telephone Encounter (Signed)
lmomtcb x 4 for the pt.  Per protocol , will sign off of this message at this time.

## 2017-01-17 NOTE — Telephone Encounter (Signed)
lmtcb X1 for pt to make aware that JN is leaving practice, and to see if he has another preference for provider.

## 2017-01-17 NOTE — Telephone Encounter (Signed)
Fine with me > note the plan was to proceed with cpst and it doesn't look like that ever happened  And then went to [facility] and plan was to repeat w/u there but doesn't look from care everywhere like he followed up there either so he'll need to make a commitment if wants to establish with a 3rd pulmonary doctor

## 2017-01-17 NOTE — Telephone Encounter (Signed)
Spoke with patient. He stated that he was scheduled to see MW but he wishes to see JN. Patient is a previous patient of MW. Advised him of the switching providers protocol. He verbalized understanding.    MW, are ok you him switching to JN? JN, are ok with seeing the patient?   Please advise.

## 2017-01-18 NOTE — Telephone Encounter (Signed)
Called and spoke with pt and he stated that he wanted to see JN and discuss his results from [facility].  appt has been scheduled for Oct.

## 2017-01-22 DIAGNOSIS — E7800 Pure hypercholesterolemia, unspecified: Secondary | ICD-10-CM | POA: Diagnosis not present

## 2017-01-23 DIAGNOSIS — Z8669 Personal history of other diseases of the nervous system and sense organs: Secondary | ICD-10-CM | POA: Diagnosis not present

## 2017-01-23 DIAGNOSIS — H6522 Chronic serous otitis media, left ear: Secondary | ICD-10-CM | POA: Diagnosis not present

## 2017-01-23 DIAGNOSIS — H90A32 Mixed conductive and sensorineural hearing loss, unilateral, left ear with restricted hearing on the contralateral side: Secondary | ICD-10-CM | POA: Diagnosis not present

## 2017-01-23 DIAGNOSIS — H6982 Other specified disorders of Eustachian tube, left ear: Secondary | ICD-10-CM | POA: Diagnosis not present

## 2017-01-24 ENCOUNTER — Telehealth: Payer: Self-pay | Admitting: Physician Assistant

## 2017-01-24 DIAGNOSIS — I10 Essential (primary) hypertension: Secondary | ICD-10-CM | POA: Diagnosis not present

## 2017-01-24 DIAGNOSIS — I2510 Atherosclerotic heart disease of native coronary artery without angina pectoris: Secondary | ICD-10-CM | POA: Diagnosis not present

## 2017-01-26 NOTE — Telephone Encounter (Signed)
Close encounter 

## 2017-02-01 ENCOUNTER — Ambulatory Visit (INDEPENDENT_AMBULATORY_CARE_PROVIDER_SITE_OTHER): Payer: Medicare Other | Admitting: Physician Assistant

## 2017-02-01 ENCOUNTER — Encounter: Payer: Self-pay | Admitting: Physician Assistant

## 2017-02-01 VITALS — BP 150/67 | HR 52 | Ht 72.0 in | Wt 171.0 lb

## 2017-02-01 DIAGNOSIS — I2510 Atherosclerotic heart disease of native coronary artery without angina pectoris: Secondary | ICD-10-CM | POA: Diagnosis not present

## 2017-02-01 DIAGNOSIS — I4510 Unspecified right bundle-branch block: Secondary | ICD-10-CM | POA: Diagnosis not present

## 2017-02-01 DIAGNOSIS — R0609 Other forms of dyspnea: Secondary | ICD-10-CM

## 2017-02-01 DIAGNOSIS — G4733 Obstructive sleep apnea (adult) (pediatric): Secondary | ICD-10-CM

## 2017-02-01 DIAGNOSIS — I10 Essential (primary) hypertension: Secondary | ICD-10-CM

## 2017-02-01 DIAGNOSIS — E785 Hyperlipidemia, unspecified: Secondary | ICD-10-CM

## 2017-02-01 DIAGNOSIS — Z79899 Other long term (current) drug therapy: Secondary | ICD-10-CM | POA: Diagnosis not present

## 2017-02-01 DIAGNOSIS — E039 Hypothyroidism, unspecified: Secondary | ICD-10-CM

## 2017-02-01 DIAGNOSIS — R0600 Dyspnea, unspecified: Secondary | ICD-10-CM

## 2017-02-01 MED ORDER — PRAVASTATIN SODIUM 20 MG PO TABS: 20.0000 mg | ORAL_TABLET | Freq: Every evening | ORAL | 5 refills | Status: DC

## 2017-02-01 MED ORDER — IRBESARTAN 75 MG PO TABS: 75.0000 mg | ORAL_TABLET | Freq: Every day | ORAL | 5 refills | Status: DC

## 2017-02-01 NOTE — Patient Instructions (Signed)
Medication Instructions:   STOP Valsartan  START Irbesartan 75mg  DAILY  STOP Crestor  START Pravastatin 20mg  DAILY  Labwork:   BMET in 1 week (non-fasting) Cholesterol and liver function test in 2 months (fasting)  Testing/Procedures:  none  Follow-Up:  In 3 months with Dr. Tresa Endo  If you need a refill on your cardiac medications before your next appointment, please call your pharmacy.

## 2017-02-01 NOTE — Progress Notes (Signed)
Cardiology Office Note    Date:  02/02/2017   ID:  Timothy Spencer, DOB 05/15/1941, MRN [id number]  PCP:  Merri Brunette, MD  Cardiologist:  Dr. Tresa Endo   Chief Complaint  Patient presents with  . Follow-up    DOE, statin issues stopped taking 3-4 weeks ago, starting going to the gym    History of Present Illness:  Timothy Spencer is a 76 y.o. male with PMH of first degree AV block, HTN, HLD, hypothyroidism, OSA, CAD, RBBB and RLS. He has a long-standing history of tobacco abuse however quit smoking in May 2012 after smoking for 40 years. He undergone cardiac catheterization in 2009 and subsequently January 2012 which showed mild CAD with mid systolic bridging of his LAD narrowed up to 95% during systole and was essentially normal during diastole, 50-60% left circumflex stenosis, 30-40% mid AV groove stenosis, he also has nondominant RCA. He benefited with the addition of Ranexa to his medical regimen. A cardiopulmonary stress test subsequently suggested ischemic response. Due to significant change in his ability to exercise, he was evaluated by pulmonary service, he underwent pulmonary function study which revealed airway obstruction suggesting emphysema however absence overinflation was inconsistent with that diagnosis. It was not believed that his pulmonary function was responsible for his abrupt change with exercise intolerance. He had a Myoview on 10/28/2014 which was abnormal with large severe fixed inferior defect consistent with prior MI, however there does appears to be inferior wall motion at the same time, this was interpreted as intermediate risk study, EF hyperdynamic > 65%. He was evaluated in the cardiology office on 11/20/2014, his Imdur was increased to 60 mg daily which has since been increased to 90mg  daily. Given symptom change with exertion and ambiguous myoview result, cardiac catheterization was suggested. Cardiac catheterization performed on 11/16/2014 showed 2 vessel disease with  50% mid left circumflex lesion, 90% mid to distal LAD lesion secondary to dynamic systolic bridging which turn into a 0% during diastole. Medical therapy was recommended.  I last saw the patient in January 2018. He has had echocardiogram obtained on 12/15/2015 that showed EF 60-65%, no significant valvular issue. He also had a PFT on 12/29/2015 that showed predicted FEV1 66.4%, predicted FVC 72.1%, FEV1 to FVC ratio 67%. During my last office visit, his blood pressure was borderline, therefore I decreased his valsartan to 40 mg daily.  Patient presents today for cardiology office evaluation. 3 weeks ago he self discontinued his Crestor for as he is convinced the Crestor was causing increasing dyspnea on exertion. Amazingly, his dyspnea on exertion resolved the day after he stopped Crestor. He has seen Dr. Oleh Genin since who recommended to discuss this with cardiology service as he has a history of coronary artery disease. I reviewed his previous cath report with him in 2016, although he does have a marked myocardial bridging in the LAD, he also has a 50% mid left circumflex lesion. He will need very aggressive lipid control. He has tried both Lipitor and Crestor in the past. We discussed possibility including Zocor, pitavastatin, lovastatin and Pravachol, eventually we agreed to start on Pravachol 20 mg daily. Of note, his valsartan was recently discontinued as well due to the national recall. He says he has not been placed on replacement medication. We will request records of last office visit from Dr. Gerald Leitz office. During the meantime I recommended initiation of irbesartan 75 mg daily. He will need a one-week basic metabolic panel. He will also need a 2 month  fasting lipid panel and liver function test. Dr. Tresa Endo can see him back in 3 month. Otherwise he denies any chest pain with ambulation or exertion. He has been doing very well after stopping the Crestor.   Past Medical History:  Diagnosis Date  . Anxiety  and depression   . BPH (benign prostatic hyperplasia)   . CAD (coronary artery disease)   . DOE (dyspnea on exertion)   . ED (erectile dysfunction)   . First degree AV block   . GERD (gastroesophageal reflux disease)   . Glaucoma   . Gout   . Gynecomastia   . H/O ETOH abuse   . Hemochromatosis   . Hyperlipidemia   . Hypertension   . Hypothyroidism   . OSA (obstructive sleep apnea)   . Osteopenia   . Peripheral neuropathy   . RLS (restless legs syndrome)   . Thrombocytopenia (HCC)     Past Surgical History:  Procedure Laterality Date  . CARDIAC CATHETERIZATION  03/27/2008   Medical therapy  . CARDIAC CATHETERIZATION  06/03/2010   Medical theray and smoking cessation  . CARDIAC CATHETERIZATION N/A 11/24/2014   Procedure: Left Heart Cath and Coronary Angiography;  Surgeon: Lennette Bihari, MD;  Location: [facility] INVASIVE CV LAB;  Service: Cardiovascular;  Laterality: N/A;  . CARDIOPULMONARY EXERCISE TEST  02/12/2012   Peak VO2 63% of predicted  . TRANSTHORACIC ECHOCARDIOGRAM  11/28/2011   EF >55%, normal    Current Medications: Outpatient Medications Prior to Visit  Medication Sig Dispense Refill  . cholecalciferol (VITAMIN D) 1000 units tablet Take 1,000 Units by mouth daily.    . isosorbide mononitrate (IMDUR) 60 MG 24 hr tablet TAKE 1 AND 1/2 TABLETS (90 MG) BY MOUTH DAILY. 135 tablet 3  . levothyroxine (SYNTHROID, LEVOTHROID) 175 MCG tablet Take 1 tablet by mouth daily.  5  . ranitidine (ZANTAC) 150 MG tablet Take 150 mg by mouth 2 (two) times daily.    Marland Kitchen rOPINIRole (REQUIP) 1 MG tablet Take 1 mg by mouth 3 (three) times daily.     . sertraline (ZOLOFT) 100 MG tablet Take 100 mg by mouth daily.     . tamsulosin (FLOMAX) 0.4 MG CAPS capsule Take 0.4 mg by mouth daily.     Marland Kitchen aspirin 81 MG tablet Take 81 mg by mouth daily.    . Multiple Vitamins-Minerals (ICAPS MV PO) Take 1 tablet by mouth daily.    . rosuvastatin (CRESTOR) 20 MG tablet Take 20 mg by mouth daily.  0  . valsartan  (DIOVAN) 80 MG tablet Take 0.5 tablets (40 mg total) by mouth daily. 90 tablet 3   No facility-administered medications prior to visit.      Allergies:   Patient has no known allergies.   Social History   Social History  . Marital status: Married    Spouse name: N/A  . Number of children: N/A  . Years of education: N/A   Social History Main Topics  . Smoking status: Former Smoker    Packs/day: 1.50    Years: 30.00    Types: Cigarettes    Quit date: 03/19/2011  . Smokeless tobacco: Never Used  . Alcohol use No  . Drug use: No  . Sexual activity: Not Asked   Other Topics Concern  . None   Social History Narrative  . None     Family History:  The patient's family history is not on file. He was adopted.   ROS:   Please see the history of  present illness.    ROS All other systems reviewed and are negative.   PHYSICAL EXAM:   VS:  BP (!) 150/67   Pulse (!) 52   Ht 6' (1.829 m)   Wt 171 lb (77.6 kg)   SpO2 98%   BMI 23.19 kg/m    GEN: Well nourished, well developed, in no acute distress  HEENT: normal  Neck: no JVD, carotid bruits, or masses Cardiac: RRR; no murmurs, rubs, or gallops,no edema  Respiratory:  clear to auscultation bilaterally, normal work of breathing GI: soft, nontender, nondistended, + BS MS: no deformity or atrophy  Skin: warm and dry, no rash Neuro:  Alert and Oriented x 3, Strength and sensation are intact Psych: euthymic mood, full affect  Wt Readings from Last 3 Encounters:  02/01/17 171 lb (77.6 kg)  10/19/16 169 lb (76.7 kg)  06/19/16 178 lb (80.7 kg)      Studies/Labs Reviewed:   EKG:  EKG is not ordered today.   Recent Labs: 06/19/2016: BUN 23; Creat 1.44; Hemoglobin 14.7; Platelets 140; Potassium 3.8; Sodium 141   Lipid Panel No results found for: CHOL, TRIG, HDL, CHOLHDL, VLDL, LDLCALC, LDLDIRECT  Additional studies/ records that were reviewed today include:   Myoview 10/28/2014 Study Highlights    Myocardial  perfusion is abnormal. There is a large, severe fixed inferior defect. Findings consistent with prior myocardial infarction, however, there does appear to be inferior wall motion. This is an intermediate risk study. Overall left ventricular systolic function was normal. LV cavity size is normal. The left ventricular ejection fraction is hyperdynamic (>65%). There is no prior study for comparison.  Clinical correlation is advised      Cath 11/24/2014 Conclusion    Mid Cx lesion, 50% stenosed.  Mid LAD to Dist LAD lesion, 90% stenosed.  Normal LV function.  2 vessel coronary obstructive disease with dynamic systolic bridging in a mid LAD intramyocardial segment which narrows to 80-90% during systole and 0% during diastole; and 50% mid AV groove circumflex stenosis and a left dominant circumflex system.  RECOMMENDATION:  Medical therapy.     PFT 09/10/2015 Conclusions: Although there is airway obstruction and a diffusion defect suggesting emphysema, the absence of overinflation is inconsistent with that diagnosis. In view of the severity of the diffusion defect, studies with exercise would be helpful to evaluate the presence of hypoxemia. Pulmonary Function Diagnosis: Minimal Obstructive Airways Disease Moderately severe Diffusion Defect Insignificant response to bronchodilator Normal lung volumes    ASSESSMENT:    1. DOE (dyspnea on exertion)   2. Encounter for long-term (current) use of medications   3. Hyperlipidemia with target LDL less than 70   4. Essential hypertension   5. Hypothyroidism, unspecified type   6. Coronary artery disease involving native coronary artery of native heart without angina pectoris   7. RBBB   8. OSA (obstructive sleep apnea)      PLAN:  In order of problems listed above:  1. Dyspnea on exertion: Resolved after he discontinued Crestor, he is convinced it was still Crestor that caused his symptoms. He has not had any further  symptom for the past 3 weeks. I will initiate pravastatin instead.   2. Hyperlipidemia: Initiating pravastatin 20 mg daily. Obtain fasting lipid panel and LFTs in 2 months  3. Hypertension: Valsartan has been discontinued due to national recall, we will start irbesartan 75 mg daily. One week basic metabolic panel  4. CAD: 50% left circumflex lesion along with myocardial bridging in LAD  on previous cath 2016, will need aggressive risk factor control. Continue Imdur for myocardial bridging.  5. Hypothyroidism: On Synthroid    Medication Adjustments/Labs and Tests Ordered: Current medicines are reviewed at length with the patient today.  Concerns regarding medicines are outlined above.  Medication changes, Labs and Tests ordered today are listed in the Patient Instructions below. Patient Instructions  Medication Instructions:   STOP Valsartan  START Irbesartan  DAILY  STOP Crestor  START Pravastatin  DAILY  Labwork:   BMET in 1 week (non-fasting) Cholesterol and liver function test in 2 months (fasting)  Testing/Procedures:  none  Follow-Up:  In 3 months with Dr. Tresa Endo  If you need a refill on your cardiac medications before your next appointment, please call your pharmacy.      Ramond Dial, [place]  02/02/2017 11:44 PM    [facility] [address] [place], [place], [place]  [address] Phone: [phone]; Fax: [phone]

## 2017-02-02 ENCOUNTER — Encounter: Payer: Self-pay | Admitting: Physician Assistant

## 2017-02-07 DIAGNOSIS — Z79899 Other long term (current) drug therapy: Secondary | ICD-10-CM | POA: Diagnosis not present

## 2017-02-08 DIAGNOSIS — Z23 Encounter for immunization: Secondary | ICD-10-CM | POA: Diagnosis not present

## 2017-02-08 DIAGNOSIS — I10 Essential (primary) hypertension: Secondary | ICD-10-CM | POA: Diagnosis not present

## 2017-02-08 DIAGNOSIS — E7800 Pure hypercholesterolemia, unspecified: Secondary | ICD-10-CM | POA: Diagnosis not present

## 2017-02-16 ENCOUNTER — Ambulatory Visit: Payer: Medicare Other | Admitting: Internal Medicine

## 2017-02-19 DIAGNOSIS — H6983 Other specified disorders of Eustachian tube, bilateral: Secondary | ICD-10-CM | POA: Diagnosis not present

## 2017-02-19 DIAGNOSIS — L98491 Non-pressure chronic ulcer of skin of other sites limited to breakdown of skin: Secondary | ICD-10-CM | POA: Diagnosis not present

## 2017-02-19 DIAGNOSIS — H6522 Chronic serous otitis media, left ear: Secondary | ICD-10-CM | POA: Diagnosis not present

## 2017-02-22 DIAGNOSIS — I2510 Atherosclerotic heart disease of native coronary artery without angina pectoris: Secondary | ICD-10-CM | POA: Diagnosis not present

## 2017-02-22 DIAGNOSIS — E7800 Pure hypercholesterolemia, unspecified: Secondary | ICD-10-CM | POA: Diagnosis not present

## 2017-02-22 DIAGNOSIS — F339 Major depressive disorder, recurrent, unspecified: Secondary | ICD-10-CM | POA: Diagnosis not present

## 2017-03-06 ENCOUNTER — Other Ambulatory Visit: Payer: Medicare Other

## 2017-03-06 ENCOUNTER — Ambulatory Visit (INDEPENDENT_AMBULATORY_CARE_PROVIDER_SITE_OTHER): Payer: Medicare Other | Admitting: Pulmonary Disease

## 2017-03-06 ENCOUNTER — Encounter: Payer: Self-pay | Admitting: Pulmonary Disease

## 2017-03-06 VITALS — BP 130/70 | HR 85 | Ht 72.0 in | Wt 172.1 lb

## 2017-03-06 DIAGNOSIS — R918 Other nonspecific abnormal finding of lung field: Secondary | ICD-10-CM

## 2017-03-06 DIAGNOSIS — J449 Chronic obstructive pulmonary disease, unspecified: Secondary | ICD-10-CM

## 2017-03-06 DIAGNOSIS — R911 Solitary pulmonary nodule: Secondary | ICD-10-CM | POA: Insufficient documentation

## 2017-03-06 DIAGNOSIS — IMO0001 Reserved for inherently not codable concepts without codable children: Secondary | ICD-10-CM | POA: Insufficient documentation

## 2017-03-06 NOTE — Patient Instructions (Addendum)
   We will review your test results at your follow-up appointment.  Call me if you have any questions or concerns.   TESTS ORDERED: 1. Serum alpha-1 antitrypsin phenotype 2. Full pulmonary function testing before next appointment 3. 6 minute walk test on room air before next appointment

## 2017-03-06 NOTE — Progress Notes (Signed)
Subjective:    Patient ID: Timothy Spencer, male    DOB: Nov 24, 1940, 76 y.o.   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   HPI Office visits noted from primary care physician indicate the patient had been endorsing shortness of breath with exertion. He had been doing approximately 50 minutes of exercise 60s a week with improving exercise tolerance. Patient was previously followed by Dr. Sherene Sires requested to transition to my care. He reports his breathing problems seemed to start about a year ago, primarily with exertion. He was subsequently evaluated by Dr. Sherene Sires and then by Pulmonary at [facility]. He was subsequently referred to Pulmonary Rehab that he has been participating in.   Mild COPD with emphysema:  He denies any asthma or breathing problems as a child. He denies any history of recurrent bronchitis or pneumonia as an adult. He reports he has tried inhalers in the past but doesn't recall what inhalers they were but believes one may have been Advair. No coughing or wheezing. He still has some dyspnea on exertion. He reports he has trouble walking more than 4 blocks with variable elevation. This is up from 2 blocks.   Left lower lobe nodules: Noted on low-dose chest CT imaging from February 2018. Repeat CT imaging in May showed resolution of his central left lower lobe nodule with continued stability of the more peripheral left lower lobe nodule.  Review of Systems No chest tightness, pressure, pain, or palpitations. He does have some mild, intermittent reflux that occurs less than once a month. No morning brash water taste. No dysuria, hematuria, or urinary hesitancy. No fever, chills, or sweats. He reports he has lost about 8 pounds in the last year that he had attributed to exercise.   No Known Allergies  Current Outpatient Prescriptions on File Prior to Visit  Medication Sig Dispense Refill  . cholecalciferol (VITAMIN D) 1000 units tablet  Take 1,000 Units by mouth daily.    . irbesartan (AVAPRO) 75 MG tablet Take 1 tablet (75 mg total) by mouth daily. 30 tablet 5  . isosorbide mononitrate (IMDUR) 60 MG 24 hr tablet TAKE 1 AND 1/2 TABLETS (90 MG) BY MOUTH DAILY. 135 tablet 3  . levothyroxine (SYNTHROID, LEVOTHROID) 175 MCG tablet Take 1 tablet by mouth daily.  5  . pravastatin (PRAVACHOL) 20 MG tablet Take 1 tablet (20 mg total) by mouth every evening. 30 tablet 5  . ranitidine (ZANTAC) 150 MG tablet Take 150 mg by mouth 2 (two) times daily.    Marland Kitchen rOPINIRole (REQUIP) 1 MG tablet Take 1 mg by mouth 3 (three) times daily.     . sertraline (ZOLOFT) 100 MG tablet Take 100 mg by mouth daily.     . tamsulosin (FLOMAX) 0.4 MG CAPS capsule Take 0.4 mg by mouth daily.      No current facility-administered medications on file prior to visit.     Past Medical History:  Diagnosis Date  . Anxiety and depression   . BPH (benign prostatic hyperplasia)   . CAD (coronary artery disease)   . COPD (chronic obstructive pulmonary disease) (HCC)   . DOE (dyspnea on exertion)   . ED (erectile dysfunction)   . Emphysema of lung (HCC)   . First degree AV block   . GERD (gastroesophageal reflux disease)   . Glaucoma   . Gout   . Gynecomastia   . H/O ETOH abuse   . Hemochromatosis   .  Hyperlipidemia   . Hypertension   . Hypothyroidism   . Macular degeneration   . OSA (obstructive sleep apnea)   . Osteopenia   . Peripheral neuropathy   . RLS (restless legs syndrome)   . Thrombocytopenia (HCC)     Past Surgical History:  Procedure Laterality Date  . CARDIAC CATHETERIZATION  03/27/2008   Medical therapy  . CARDIAC CATHETERIZATION  06/03/2010   Medical theray and smoking cessation  . CARDIAC CATHETERIZATION N/A 11/24/2014   Procedure: Left Heart Cath and Coronary Angiography;  Surgeon: Lennette Bihari, MD;  Location: [facility] INVASIVE CV LAB;  Service: Cardiovascular;  Laterality: N/A;  . CARDIOPULMONARY EXERCISE TEST  02/12/2012   Peak VO2 63%  of predicted  . ORIF FEMUR FRACTURE Right   . TRANSTHORACIC ECHOCARDIOGRAM  11/28/2011   EF >55%, normal    Family History  Problem Relation Age of Onset  . Adopted: Yes    Social History   Social History  . Marital status: Married    Spouse name: N/A  . Number of children: N/A  . Years of education: N/A   Social History Main Topics  . Smoking status: Former Smoker    Packs/day: 2.00    Years: 50.00    Types: Cigarettes    Start date: 04/11/1960    Quit date: 03/19/2011  . Smokeless tobacco: Never Used     Comment: Quit for 1 year total   . Alcohol use No  . Drug use: No  . Sexual activity: Not Asked   Other Topics Concern  . None   Social History Narrative   [facility] (03/06/17):   Originally from [place], [place]. Has always lived in [place]. Has traveled to [place], [place], [place], [place], [place], & [place]. Has a dog currently. No bird exposure. No mold or hot tub exposure. Previously enjoyed riding horses and playing golf. Currently has no hobbies. 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       Objective:   Physical Exam BP 130/70 (BP Location: Left Arm, Cuff Size: Normal)   Pulse 85   Ht 6' (1.829 m)   Wt 172 lb 2 oz (78.1 kg)   SpO2 93%   BMI 23.34 kg/m  General:  Awake. Alert. No acute distress.   Integument:  Warm & dry. No rash on exposed skin.  Extremities:  No cyanosis or clubbing.  HEENT:  Moist mucus membranes. No scleral injection or icterus.  Pulmonary:  Symmetric chest wall expansion. Normal work of breathing on room air. Musculoskeletal:  Normal bulk and tone. No joint deformity or effusion appreciated. Neurological:  Cranial nerves 2-12 grossly in tact. No meningismus. Moving all 4 extremities equally. Marland Kitchen   PFT 09/17/15: FVC 3.84 L (84%) FEV1 2.63 L (79%) FEV1/FVC 0.69 FEF 25-75 1.50 L (62%) negative bronchodilator response TLC 6.70 L (89%) RV 108% ERV 96% DLCO corrected 52% 10/19/14: FVC 3.93  L (85%) FEV1 2.74 L (82%) FEV1/FVC 0.70 FEF 25-75 1.68 L (68%) negative bronchodilator response TLC 7.54 L (101%) RV 140% ERV 78% DLCO uncorrected 56%  IMAGING CT CHEST W/O 10/11/16 (personally reviewed by me):  No change in the more peripheral 4 mm left lower lobe nodule. Previous nodules otherwise seemed to resolved. No new nodule or opacity appreciated. Apical predominant centrilobular emphysema noted. Questionable pleural thickening versus small effusion on the right with compressive atelectasis and right hemidiaphragm elevation. No pathologic mediastinal adenopathy. No pericardial effusion.  LD CHEST  CT W/O 07/13/16 (personally reviewed by me):  No pathologic mediastinal adenopathy. Questionable pleural thickening versus small pleural effusion on the right. Compressive atelectasis noted on the right. 6 mm nodule noted within left lower lobe. Apical predominant emphysematous changes. Radiology also noted a 9 mm left lower lobe nodule. Bronchial wall thickening also noted. Right hemidiaphragm elevation relatively unchanged. Third 4 mm peripheral left lower lobe nodule identified.  ESOPHAGRAM/BARIUM SWALLOW 09/10/15 (per radiologist):  Small hiatal hernia with mild stricture at the gastroesophageal junction. Negative for reflux.  CARDIAC TTE (12/15/15):  LV normal in size with EF 60-65%. Impaired diastolic function of LV. LA & RA normal in size. RV normal in size & function. No aortic stenosis or regurgitation. Trace mitral regurgitation. Trace tricuspid regurgitation. Normal aortic root. No pericardial effusion. Trace pulmonic regurgitation.   LABS 01/29/09 ANA:  Negative RA:  79    Assessment & Plan:  76 y.o. male with mild airway obstruction based on previous spirometry. I reviewed his chest CT imaging with him today which does show apical predominant emphysema likely due to his previous tobacco use. He does still have a 4 mm nodule within his left lower lobe. He is continuing on yearly low dose  chest CT imaging for lung cancer screening. The degree of his dyspnea has improved with pulmonary rehabilitation but not as much as he would've hoped. I did caution the patient that inhaler medications may help his lungs to work better but may not improve his work of breathing significantly by his own perception. I instructed the patient to contact me if he had questions or concerns before his next appointment.  1. Mild COPD with emphysema:  Screening for alpha-1 antitrypsin deficiency. Holding off on inhaler medications. Checking full pulmonary function testing and 6 minute walk test on room air before next appointment. Patient continuing in pulmonary rehabilitation. 2. Left lower lobe nodule: Continuing lung cancer screening with yearly low dose chest CT. 3. Health maintenance: Status post Flu Vaccine September. Reports he previously had a Pneumonia Vaccine.  4. Follow-up: Return to clinic in 4 weeks or sooner if needed.  Donna Christen Jamison Neighbor, M.D. [facility] Pager:  [phone] After 7pm or if no response, call [phone] 4:12 PM 03/06/17

## 2017-03-07 DIAGNOSIS — H353133 Nonexudative age-related macular degeneration, bilateral, advanced atrophic without subfoveal involvement: Secondary | ICD-10-CM | POA: Diagnosis not present

## 2017-03-09 LAB — ALPHA-1 ANTITRYPSIN PHENOTYPE: A-1 Antitrypsin, Ser: 157 mg/dL (ref 83–199)

## 2017-03-13 ENCOUNTER — Ambulatory Visit (INDEPENDENT_AMBULATORY_CARE_PROVIDER_SITE_OTHER): Payer: Medicare Other | Admitting: Pulmonary Disease

## 2017-03-13 DIAGNOSIS — J449 Chronic obstructive pulmonary disease, unspecified: Secondary | ICD-10-CM

## 2017-03-13 LAB — PULMONARY FUNCTION TEST
DL/VA % pred: 67 %
DL/VA: 3.18 ml/min/mmHg/L
DLCO unc % pred: 47 %
DLCO unc: 16.64 ml/min/mmHg
FEF 25-75 Post: 1.57 L/sec
FEF 25-75 Pre: 0.91 L/sec
FEF2575-%Change-Post: 73 %
FEF2575-%Pred-Post: 67 %
FEF2575-%Pred-Pre: 39 %
FEV1-%Change-Post: 15 %
FEV1-%Pred-Post: 59 %
FEV1-%Pred-Pre: 51 %
FEV1-Post: 1.92 L
FEV1-Pre: 1.66 L
FEV1FVC-%Change-Post: 4 %
FEV1FVC-%Pred-Pre: 92 %
FEV6-%Change-Post: 10 %
FEV6-%Pred-Post: 64 %
FEV6-%Pred-Pre: 58 %
FEV6-Post: 2.72 L
FEV6-Pre: 2.46 L
FEV6FVC-%Change-Post: 0 %
FEV6FVC-%Pred-Post: 105 %
FEV6FVC-%Pred-Pre: 105 %
FVC-%Change-Post: 10 %
FVC-%Pred-Post: 61 %
FVC-%Pred-Pre: 55 %
FVC-Post: 2.75 L
FVC-Pre: 2.49 L
Post FEV1/FVC ratio: 70 %
Post FEV6/FVC ratio: 99 %
Pre FEV1/FVC ratio: 67 %
Pre FEV6/FVC Ratio: 99 %
RV % pred: 120 %
RV: 3.26 L
TLC % pred: 80 %
TLC: 5.96 L

## 2017-03-13 NOTE — Progress Notes (Signed)
PFT done today. 

## 2017-04-03 DIAGNOSIS — E7800 Pure hypercholesterolemia, unspecified: Secondary | ICD-10-CM | POA: Diagnosis not present

## 2017-04-05 DIAGNOSIS — J432 Centrilobular emphysema: Secondary | ICD-10-CM | POA: Diagnosis not present

## 2017-04-05 DIAGNOSIS — E7800 Pure hypercholesterolemia, unspecified: Secondary | ICD-10-CM | POA: Diagnosis not present

## 2017-04-11 ENCOUNTER — Ambulatory Visit: Payer: Medicare Other | Admitting: Pulmonary Disease

## 2017-04-11 ENCOUNTER — Ambulatory Visit (INDEPENDENT_AMBULATORY_CARE_PROVIDER_SITE_OTHER): Payer: Medicare Other | Admitting: *Deleted

## 2017-04-11 ENCOUNTER — Encounter: Payer: Self-pay | Admitting: Pulmonary Disease

## 2017-04-11 VITALS — BP 130/74 | HR 86 | Ht 72.0 in | Wt 173.4 lb

## 2017-04-11 DIAGNOSIS — J449 Chronic obstructive pulmonary disease, unspecified: Secondary | ICD-10-CM | POA: Diagnosis not present

## 2017-04-11 DIAGNOSIS — R911 Solitary pulmonary nodule: Secondary | ICD-10-CM

## 2017-04-11 DIAGNOSIS — IMO0001 Reserved for inherently not codable concepts without codable children: Secondary | ICD-10-CM

## 2017-04-11 DIAGNOSIS — R0600 Dyspnea, unspecified: Secondary | ICD-10-CM

## 2017-04-11 DIAGNOSIS — R0609 Other forms of dyspnea: Secondary | ICD-10-CM | POA: Diagnosis not present

## 2017-04-11 DIAGNOSIS — Z148 Genetic carrier of other disease: Secondary | ICD-10-CM | POA: Insufficient documentation

## 2017-04-11 DIAGNOSIS — E8801 Alpha-1-antitrypsin deficiency: Secondary | ICD-10-CM | POA: Diagnosis not present

## 2017-04-11 NOTE — Progress Notes (Signed)
Subjective:    Patient ID: Timothy Spencer, male    DOB: November 10, 1940, 76 y.o.   MRN: [id number]  C.C.:  Follow-up for Moderate-Severe COPD w/ Emphysema & Left Lower Lobe Nodule.   HPI Moderate-severe COPD with emphysema: Has been on inhalers in the past but none for more than a week at a time. No history of childhood asthma or breathing problems. No history of recurrent bronchitis. Previously on able to walk up to 2 blocks & now able to walk 4-5 blocks. He also has more problems breathing with an incline. Not currently on inhaler therapy. He does feel his breathing has been doing better over the last year. He was able to do a treadmill for 18 minutes today and bicycle for 15 minutes today. He does this 4 days a week. He denies any recent coughing or wheezing.  Left lower lobe nodule: Noted initially on low-dose chest CT imaging February 2018. Repeat imaging May showed resolution of central left lower lobe nodule with persistent peripheral left lower lobe nodule.   Review of Systems No chest pain or pressure. No fever or chills. No abdominal pain or nausea.   No Known Allergies  Current Outpatient Medications on File Prior to Visit  Medication Sig Dispense Refill  . cholecalciferol (VITAMIN D) 1000 units tablet Take 1,000 Units by mouth daily.    . irbesartan (AVAPRO) 75 MG tablet Take 1 tablet (75 mg total) by mouth daily. 30 tablet 5  . isosorbide mononitrate (IMDUR) 60 MG 24 hr tablet TAKE 1 AND 1/2 TABLETS (90 MG) BY MOUTH DAILY. 135 tablet 3  . levothyroxine (SYNTHROID, LEVOTHROID) 175 MCG tablet Take 1 tablet by mouth daily.  5  . pravastatin (PRAVACHOL) 20 MG tablet Take 1 tablet (20 mg total) by mouth every evening. 30 tablet 5  . ranitidine (ZANTAC) 150 MG tablet Take 150 mg by mouth 2 (two) times daily.    Marland Kitchen rOPINIRole (REQUIP) 1 MG tablet Take 1 mg by mouth 3 (three) times daily.     . sertraline (ZOLOFT) 100 MG tablet Take 100 mg by mouth daily.     . tamsulosin (FLOMAX) 0.4 MG  CAPS capsule Take 0.4 mg by mouth daily.      No current facility-administered medications on file prior to visit.     Past Medical History:  Diagnosis Date  . Anxiety and depression   . BPH (benign prostatic hyperplasia)   . CAD (coronary artery disease)   . COPD (chronic obstructive pulmonary disease) (HCC)   . DOE (dyspnea on exertion)   . ED (erectile dysfunction)   . Emphysema of lung (HCC)   . First degree AV block   . GERD (gastroesophageal reflux disease)   . Glaucoma   . Gout   . Gynecomastia   . H/O ETOH abuse   . Hemochromatosis   . Hyperlipidemia   . Hypertension   . Hypothyroidism   . Macular degeneration   . OSA (obstructive sleep apnea)   . Osteopenia   . Peripheral neuropathy   . RLS (restless legs syndrome)   . Thrombocytopenia (HCC)     Past Surgical History:  Procedure Laterality Date  . CARDIAC CATHETERIZATION  03/27/2008   Medical therapy  . CARDIAC CATHETERIZATION  06/03/2010   Medical theray and smoking cessation  . CARDIOPULMONARY EXERCISE TEST  02/12/2012   Peak VO2 63% of predicted  . ORIF FEMUR FRACTURE Right   . TRANSTHORACIC ECHOCARDIOGRAM  11/28/2011   EF >55%, normal  Family History  Adopted: Yes    Social History   Socioeconomic History  . Marital status: Married    Spouse name: None  . Number of children: None  . Years of education: None  . Highest education level: None  Social Needs  . Financial resource strain: None  . Food insecurity - worry: None  . Food insecurity - inability: None  . Transportation needs - medical: None  . Transportation needs - non-medical: None  Occupational History  . None  Tobacco Use  . Smoking status: Former Smoker    Packs/day: 2.00    Years: 50.00    Pack years: 100.00    Types: Cigarettes    Start date: 04/11/1960    Last attempt to quit: 03/19/2011    Years since quitting: 6.0  . Smokeless tobacco: Never Used  . Tobacco comment: Quit for 1 year total   Substance and Sexual  Activity  . Alcohol use: No    Alcohol/week: 0.0 oz  . Drug use: No  . Sexual activity: None  Other Topics Concern  . None  Social History Narrative   [facility] (03/06/17):   [place]. Has always lived in [place]. Has traveled to [place], [place], [place], [place] & [place]. Has a dog currently. No bird exposure. No mold or hot tub exposure. Previously enjoyed riding horses and playing golf. Currently has no hobbies. 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       Objective:   Physical Exam BP 130/74 (BP Location: Left Arm, Cuff Size: Normal)   Pulse 86   Ht 6' (1.829 m)   Wt 173 lb 6 oz (78.6 kg)   SpO2 100%   BMI 23.51 kg/m   General:  Awake. Comfortable. No distress..  Integument:  Warm. Dry. No bruising. Extremities:  No cyanosis or clubbing.  HEENT:  No scleral icterus. Moist mucous membranes. No oral ulcers. Cardiovascular:  Regular rate. No edema. No appreciable JVD.  Pulmonary:  Clear bilaterally to auscultation. Normal work of breathing on room air. Abdomen: Soft. Normal bowel sounds. Nondistended.  Musculoskeletal:  Normal bulk and tone. Kyphosis. No joint deformity or effusion appreciated. Neurological:  Cranial nerves 2-12 grossly in tact. No meningismus.   PFT 03/13/17: FVC 2.49 L (55%) FEV1 1.66 L (51%) FEV1/FVC 0.67 FEF 25-75 0.91 L (39%) positive bronchodilator response TLC 5.96 L (80%) RV 120% ERV 52% DLCO uncorrected 47% 09/17/15: FVC 3.84 L (84%) FEV1 2.63 L (79%) FEV1/FVC 0.69 FEF 25-75 1.50 L (62%) negative bronchodilator response TLC 6.70 L (89%) RV 108% ERV 96% DLCO corrected 52% 10/19/14: FVC 3.93 L (85%) FEV1 2.74 L (82%) FEV1/FVC 0.70 FEF 25-75 1.68 L (68%) negative bronchodilator response TLC 7.54 L (101%) RV 140% ERV 78% DLCO uncorrected 56%  6MWT 04/11/17:  Walked 312 Meters / Baseline Sat 100% on RA / Nadir Sat 98% on RA  IMAGING CT CHEST W/O 10/11/16 (previously  reviewed by me):  No change in the more peripheral 4 mm left lower lobe nodule. Previous nodules otherwise seemed to resolved. No new nodule or opacity appreciated. Apical predominant centrilobular emphysema noted. Questionable pleural thickening versus small effusion on the right with compressive atelectasis and right hemidiaphragm elevation. No pathologic mediastinal adenopathy. No pericardial effusion.  LD CHEST CT W/O 07/13/16 (previously reviewed by me):  No pathologic mediastinal adenopathy. Questionable pleural thickening versus small pleural effusion on the right. Compressive atelectasis noted on the right.  6 mm nodule noted within left lower lobe. Apical predominant emphysematous changes. Radiology also noted a 9 mm left lower lobe nodule. Bronchial wall thickening also noted. Right hemidiaphragm elevation relatively unchanged. Third 4 mm peripheral left lower lobe nodule identified.  ESOPHAGRAM/BARIUM SWALLOW 09/10/15 (per radiologist):  Small hiatal hernia with mild stricture at the gastroesophageal junction. Negative for reflux.  CARDIAC TTE (12/15/15):  LV normal in size with EF 60-65%. Impaired diastolic function of LV. LA & RA normal in size. RV normal in size & function. No aortic stenosis or regurgitation. Trace mitral regurgitation. Trace tricuspid regurgitation. Normal aortic root. No pericardial effusion. Trace pulmonic regurgitation.   LABS 03/06/17 Alpha-1 antitrypsin: MS (159)  01/29/09 ANA:  Negative RA:  79    Assessment & Plan:  76 y.o. male with moderate-severe COPD based on spirometry from October. There is also a significant bronchodilator response at that time. I believe through the patient's exercise regimen he has improved his endurance and in a way been doing his own pulmonary rehabilitation. He does still have borderline air trapping on his lung volumes and despite the moderate reduction in his carbon monoxide diffusion capacity exhibits no significant desaturation or  hypoxia during his walk test today. 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 We also discussed starting a maintenance inhaler that should help with V/Q matching and improve airflow. Given the patient's underlying questionable glaucoma he is going to check with his ophthalmologist regarding the safety of starting a daily LAMA. he will then contact me for prescription accordingly. I instructed him to contact my office if he had any further questions or concerns before his next appointment.   1. Moderate-severe COPD with emphysema: Patient to confirm type of glaucoma with his ophthalmologist. Then he will start Anoro and contact me for a prescription for an oral or contact me for a prescription for an alternative inhaler. 2. 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 3. Left lower lobe nodule: Continuing to monitor with yearly low dose chest CT imaging for lung cancer screening. 4. Health maintenance: Status post Flu Vaccine September & reported prior Pneumonia vaccine. Patient to check regarding Prevnar vaccine with primary care physician. 5. Follow-up: Return to clinic in 3 months or sooner if needed.  Donna Christen Jamison Neighbor, M.D. [facility] Pager:  [phone] After 7pm or if no response, call [phone] 4:28 PM 04/11/17

## 2017-04-11 NOTE — Patient Instructions (Addendum)
   Continue your current exercise regimen.  Check with your Eye Doctor to see if you have "narrow angle glaucoma". One of the medications in your Anoro inhaler can make this worse causing eye pain and even more blurred vision.  If you do have narrow angle glaucoma then call and let me know and we will try a different inhaler other than Anoro.  If your Eye Doctor says it's ok to try the Anoro then do 1 inhalation off the device once daily a the same time every day. If you have any blurry vision with it or difficulty urinating then stop using it and let me know.  If you tolerate the Anoro then please call me and we will send in a prescription to your pharmacy. Your insurance may cover it but if they don't then they will have alternative inhaler medications that we can change to.  If you have any questions or concerns please call.   Remember to discuss your Alpha-1 Antitrypsin Carrier status with any blood relatives with liver disease, lung disease or who are smoking. There is treatment if they have it.   We will see you back in 3 months or sooner if needed.

## 2017-04-11 NOTE — Progress Notes (Signed)
SIX MIN WALK 04/11/2017 08/13/2015 06/17/2015  Medications Avapro 75mg , Synthroid @ 1100 - -  Supplimental Oxygen during Test? (L/min) No No No  Laps 6 - -  Partial Lap (in Meters) 24 - -  Baseline BP (sitting) 124/68 - -  Baseline Heartrate 88 - -  Baseline Dyspnea (Borg Scale) 2 - -  Baseline Fatigue (Borg Scale) 2 - -  Baseline SPO2 100 - -  BP (sitting) 144/76 - -  Heartrate 110 - -  Dyspnea (Borg Scale) 3 - -  Fatigue (Borg Scale) 2 - -  SPO2 98 - -  BP (sitting) 130/74 - -  Heartrate 86 - -  SPO2 100 - -  Stopped or Paused before Six Minutes No - -  Distance Completed 312 - -  Tech Comments: patient walked at a moderate pace and a forehead probe was used for the entirety of the test.  pt denied any angina, dizziness, hip/leg/calf pain at the end of the test. - moderate pace, mild sob lap 2 & 3.

## 2017-04-17 ENCOUNTER — Telehealth: Payer: Self-pay | Admitting: Pulmonary Disease

## 2017-04-17 MED ORDER — UMECLIDINIUM-VILANTEROL 62.5-25 MCG/INH IN AEPB
1.0000 | INHALATION_SPRAY | Freq: Every day | RESPIRATORY_TRACT | 3 refills | Status: DC
Start: 2017-04-17 — End: 2017-09-27

## 2017-04-17 NOTE — Telephone Encounter (Signed)
JN pt states he does not have "narrow angle glaucoma" so ok to send in Anoro? DO you need notes from the doctor or do we just send this in? Please advise.

## 2017-04-17 NOTE — Telephone Encounter (Signed)
Go ahead and send in the prescription for the Anoro - 1 inhalation daily - 1 inhaler - 3 refills. Thanks.

## 2017-04-18 ENCOUNTER — Ambulatory Visit: Payer: Medicare Other | Admitting: Cardiovascular Disease

## 2017-05-02 DIAGNOSIS — H401131 Primary open-angle glaucoma, bilateral, mild stage: Secondary | ICD-10-CM | POA: Diagnosis not present

## 2017-05-03 ENCOUNTER — Ambulatory Visit: Payer: Medicare Other | Admitting: Cardiovascular Disease

## 2017-05-03 ENCOUNTER — Encounter: Payer: Self-pay | Admitting: Cardiovascular Disease

## 2017-05-03 VITALS — BP 118/62 | HR 60 | Ht 72.0 in | Wt 174.0 lb

## 2017-05-03 DIAGNOSIS — I10 Essential (primary) hypertension: Secondary | ICD-10-CM | POA: Diagnosis not present

## 2017-05-03 DIAGNOSIS — R0609 Other forms of dyspnea: Secondary | ICD-10-CM | POA: Diagnosis not present

## 2017-05-03 DIAGNOSIS — I4510 Unspecified right bundle-branch block: Secondary | ICD-10-CM

## 2017-05-03 DIAGNOSIS — E785 Hyperlipidemia, unspecified: Secondary | ICD-10-CM | POA: Diagnosis not present

## 2017-05-03 DIAGNOSIS — R0600 Dyspnea, unspecified: Secondary | ICD-10-CM

## 2017-05-03 DIAGNOSIS — I440 Atrioventricular block, first degree: Secondary | ICD-10-CM

## 2017-05-03 DIAGNOSIS — I2510 Atherosclerotic heart disease of native coronary artery without angina pectoris: Secondary | ICD-10-CM

## 2017-05-03 NOTE — Patient Instructions (Signed)
Medication Instructions:  Your physician recommends that you continue on your current medications as directed. Please refer to the Current Medication list given to you today.  Follow-Up: Your physician wants you to follow-up in: 6 months with Azalee Course PA and 12 months with Dr. Tresa Endo. You will receive a reminder letter in the mail two months in advance. If you don't receive a letter, please call our office to schedule the follow-up appointment.   Any Other Special Instructions Will Be Listed Below (If Applicable).     If you need a refill on your cardiac medications before your next appointment, please call your pharmacy.

## 2017-05-03 NOTE — Progress Notes (Signed)
Patient ID: Timothy Spencer, male   DOB: 1941/03/05, 76 y.o.   MRN: [id number]      HPI: Timothy Spencer is a 76 y.o. male who presents for a 7 month follow-up cardiology evaluation.    Mr. Asbridge has a previous long-standing history of tobacco abuse  but quit smoking in May 2012 after smoking for over 40 years. He had undergone cardiac catheterization initially in 2009 and subsequently in January 2012 which showed mild CAD with mid systolic bridging of his LAD which narrowed up to 95% during systole and was essentially normal during diastole, 50-60% circumflex stenosis, 30-40% mid AV groove stenosis and he had a nondominant right coronary artery. He  benefited with the addition of Ranexa to his medical regimen. A cardiopulmonary met test subsequently suggested an ischemic response. He has been exercising regularly. He no longer smokes cigarettes. He has chronic right bundle branch block.   When I saw him in May 2015  he had been taking off his metoprolol due to continued fatigability. He does have a history of hypothyroidism, and he has been on Synthroid 150 mcg.    Laboratory done by Dr. Shelia Media in April 2015 showed a hemoglobin of 14.2, hematocrit 42.0.  BUN 2621.5.  Lipid studies were excellent with a total cholesterol 119 triglycerides were slightly elevated at 179, HDL 44, calculated LDL 39.  Urinalysis was negative.   When I last saw him in 2016 he had noticed a significant change in his ability to exercise.  He has noticed a dramatic increase in exertional shortness of breath.  He underwent palmar a function studies by Dr. Shelia Media which revealed airway obstruction and a diffusion defect suggesting emphysema but the absence of overinflation was inconsistent with that diagnosis.  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  Because of his significant symptom change he underwent definitive  catheterization on 11/16/2014 which showed two-vessel disease with 50% mid circumflex stenosis, and there was a 90% mid distal LAD lesion secondary to dynamic systolic bridging which normalized during diastole.  Increased medical therapy was recommended.  He has been evaluated at [facility] in a pulmonary function studies.  He was seen in the office by Almyra Deforest, PA January 2018 with low blood pressure.    Since I last saw him in May 2018, he was seen by Almyra Deforest in September 2018 after he was concerned that Crestor was causing increasing dyspnea on exertion.  His dyspnea improved after he stopped Crestor.  During that evaluation, he initiated pravastatin at 20 mg, which the patient has tolerated and denies any recurrent increasing shortness of breath.  He has emphysema.  He denies recent chest tightness.  He denies palpitations.  He presents for follow-up evaluation.   Past Medical History:  Diagnosis Date  . Anxiety and depression   . BPH (benign prostatic hyperplasia)   . CAD (coronary artery disease)   . COPD (chronic obstructive pulmonary disease) ([place])   . DOE (dyspnea on exertion)   . ED (erectile dysfunction)   . Emphysema of lung ([place])   . First degree AV block   . GERD (gastroesophageal reflux disease)   . Glaucoma   . Gout   . Gynecomastia   . H/O ETOH abuse   . Hemochromatosis   . Hyperlipidemia   . Hypertension   . Hypothyroidism   . Macular degeneration   . OSA (obstructive  sleep apnea)   . Osteopenia   . Peripheral neuropathy   . RLS (restless legs syndrome)   . Thrombocytopenia ([place])     Past Surgical History:  Procedure Laterality Date  . CARDIAC CATHETERIZATION  03/27/2008   Medical therapy  . CARDIAC CATHETERIZATION  06/03/2010   Medical theray and smoking cessation  . CARDIAC CATHETERIZATION N/A 11/24/2014   Procedure: Left Heart Cath and Coronary Angiography;  Surgeon: Troy Sine, MD;  Location: [facility];  Service: Cardiovascular;  Laterality:  N/A;  . CARDIOPULMONARY EXERCISE TEST  02/12/2012   Peak VO2 63% of predicted  . ORIF FEMUR FRACTURE Right   . TRANSTHORACIC ECHOCARDIOGRAM  11/28/2011   EF >55%, normal    No Known Allergies  Current Outpatient Medications  Medication Sig Dispense Refill  . cholecalciferol (VITAMIN D) 1000 units tablet Take 1,000 Units by mouth daily.    . irbesartan (AVAPRO) 75 MG tablet Take 1 tablet (75 mg total) by mouth daily. 30 tablet 5  . isosorbide mononitrate (IMDUR) 60 MG 24 hr tablet TAKE 1 AND 1/2 TABLETS (90 MG) BY MOUTH DAILY. 135 tablet 3  . levothyroxine (SYNTHROID, LEVOTHROID) 175 MCG tablet Take 1 tablet by mouth daily.  5  . ranitidine (ZANTAC) 150 MG tablet Take 150 mg by mouth 2 (two) times daily.    Marland Kitchen rOPINIRole (REQUIP) 1 MG tablet Take 1 mg by mouth 2 (two) times daily.     . sertraline (ZOLOFT) 100 MG tablet Take 100 mg by mouth daily.     . tamsulosin (FLOMAX) 0.4 MG CAPS capsule Take 0.4 mg by mouth daily.     Marland Kitchen umeclidinium-vilanterol (ANORO ELLIPTA) 62.5-25 MCG/INH AEPB Inhale 1 puff into the lungs daily. 60 each 3  . pravastatin (PRAVACHOL) 20 MG tablet Take 1 tablet (20 mg total) by mouth every evening. 30 tablet 5   No current facility-administered medications for this visit.     Social History   Socioeconomic History  . Marital status: Married    Spouse name: Not on file  . Number of children: Not on file  . Years of education: Not on file  . Highest education level: Not on file  Social Needs  . Financial resource strain: Not on file  . Food insecurity - worry: Not on file  . Food insecurity - inability: Not on file  . Transportation needs - medical: Not on file  . Transportation needs - non-medical: Not on file  Occupational History  . Not on file  Tobacco Use  . Smoking status: Former Smoker    Packs/day: 2.00    Years: 50.00    Pack years: 100.00    Types: Cigarettes    Start date: 04/11/1960    Last attempt to quit: 03/19/2011    Years since  quitting: 6.1  . Smokeless tobacco: Never Used  . Tobacco comment: Quit for 1 year total   Substance and Sexual Activity  . Alcohol use: No    Alcohol/week: 0.0 oz  . Drug use: No  . Sexual activity: Not on file  Other Topics Concern  . Not on file  Social History Narrative   [facility] (03/06/17):   Originally from [place], [place]. Has always lived in [place]. Has traveled to [place], [place], [place], [place], [place], & [place]. Has a dog currently. No bird exposure. No mold or hot tub exposure. Previously enjoyed riding horses and playing golf. Currently has no hobbies. 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     Family History  Adopted: Yes   Additional social history is notable in that he is married has 2 children. He does walk. He quit smoking in 2012. His alcohol use.  ROS General: Positive for mild fatigue; No fevers, chills, or night sweats;  HEENT: Negative; No changes in vision or hearing, sinus congestion, difficulty swallowing Pulmonary:  Positive for  emphysema.  Shortness of breath Cardiovascular:  See history of present illness GI: Positive for constipation and difficult to wipe his stool; No nausea, vomiting, diarrhea, or abdominal pain GU: Negative; No dysuria, hematuria, or difficulty voiding Musculoskeletal: Negative; no myalgias, joint pain, or weakness Hematologic/Oncology: Negative; no easy bruising, bleeding Endocrine: Positive for hypothyroidism , on Synthroid replacement; no heat/cold intolerance; no diabetes Neuro: Negative; no changes in balance, headaches Skin: Negative; No rashes or skin lesions Psychiatric: Negative; No behavioral problems, depression Sleep: Negative; No snoring, daytime sleepiness, hypersomnolence, bruxism, restless legs, hypnogognic hallucinations, no cataplexy Other comprehensive 14 point system review is negative.   PE BP 118/62   Pulse 60   Ht 6' (1.829 m)   Wt 174 lb (78.9  kg)   BMI 23.60 kg/m    Repeat blood pressure was 116/60 without orthostatic change.  Wt Readings from Last 3 Encounters:  05/03/17 174 lb (78.9 kg)  04/11/17 173 lb 6 oz (78.6 kg)  03/06/17 172 lb 2 oz (78.1 kg)   General: Alert, oriented, no distress.  Skin: normal turgor, no rashes, warm and dry HEENT: Normocephalic, atraumatic. Pupils equal round and reactive to light; sclera anicteric; extraocular muscles intact;  Nose without nasal septal hypertrophy Mouth/Parynx benign; Mallinpatti scale 3 Neck: No JVD, no carotid bruits; normal carotid upstroke Lungs: Decreased breath sounds without rales or wheezes. Chest wall: without tenderness to palpitation Heart: PMI not displaced, RRR, s1 s2 normal, 1/6 systolic murmur, no diastolic murmur, no rubs, gallops, thrills, or heaves Abdomen: soft, nontender; no hepatosplenomehaly, BS+; abdominal aorta nontender and not dilated by palpation. Back: no CVA tenderness; no change in previous small lipoma Pulses 2+ Musculoskeletal: full range of motion, normal strength, no joint deformities Extremities: no clubbing cyanosis or edema, Homan's sign negative  Neurologic: grossly nonfocal; Cranial nerves grossly wnl Psychologic: Normal mood and affect   ECG (independently read by me): Normal sinus rhythm at 60 bpm with first-degree AV block.  Right bundle branch block with repolarization changes.  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  PR interval 254 ms.  QTc interval 440 ms.  May 2018 ECG (independently read by me): Normal sinus rhythm with first-degree AV block at 61 bpm.  PR interval 260 ms.  Right bundle-branch block with repolarization changes.  June 2016 ECG (independently read by me):  Sinus rhythm with first-degree AV block.  Right bundle-branch block.  PR interval 266 ms. inferior infarct.  February 2016ECG (independently read by me): Sinus rhythm at 64 bpm. ,  First-degree AV block.  Right bundle-branch block with repolarization changes.   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  The computer was incorrect and reading this as an undetermined rhythm.  May 2015 ECG  normal sinus rhythm at 62 beats per minute.  Right bundle branch block with repolarization changes.  First-degree AV block with PR interval of 252 ms.  Prior October 2014 ECG: Sinus rhythm with right bundle branch block with first-degree AV block.  LABS:  BMP Latest Ref Rng & Units 06/19/2016 08/13/2015 11/20/2014  Glucose 65 - 99 mg/dL 88 93 89  BUN 7 -  25 mg/dL 23 26(H) 20  Creatinine 0.70 - 1.18 mg/dL 1.44(H) 1.43 1.29  Sodium 135 - 146 mmol/L 141 141 143  Potassium 3.5 - 5.3 mmol/L 3.8 4.0 4.4  Chloride 98 - 110 mmol/L 107 106 103  CO2 20 - 31 mmol/L _0 Calcium 8.6 - 10.3 mg/dL 9.4 9.6 9.4   Hepatic Function Latest Ref Rng & Units 11/20/2014 12/06/2009 06/04/2009  Total Protein 6.0 - 8.3 g/dL 6.8 7.4 6.5  Albumin 3.5 - 5.2 g/dL 4.1 4.5 4.2  AST 0 - 37 U/L _1 ALT 0 - 53 U/L _2 Alk Phosphatase 39 - 117 U/L 101 87 96  Total Bilirubin 0.2 - 1.2 mg/dL 1.0 0.9 1.4(H)   CBC Latest Ref Rng & Units 06/19/2016 08/13/2015 11/20/2014  WBC 3.8 - 10.8 K/uL 7.2 7.2 6.1  Hemoglobin 13.2 - 17.1 g/dL 14.7 14.8 14.1  Hematocrit 38.5 - 50.0 % 43.2 43.3 42.6  Platelets 140 - 400 K/uL 140 148.0(L) 128(L)   Lab Results  Component Value Date   MCV 94.1 06/19/2016   MCV 97.0 08/13/2015   MCV 96.4 11/20/2014   Lab Results  Component Value Date   TSH 3.70 08/13/2015   Lipid Panel  No results found for: CHOL, TRIG, HDL, CHOLHDL, VLDL, LDLCALC, LDLDIRECT   RADIOLOGY: No results found.  IMPRESSION:  1. Essential hypertension   2. Coronary artery disease involving native coronary artery of native heart without angina pectoris   3. RBBB   4. DOE (dyspnea on exertion)   5. 1st degree AV block   6. Hyperlipidemia with target LDL less than 70     ASSESSMENT AND PLAN: Mr. Kinsel is a 76 year old white male who has established CAD with documented midsystolic  bridging of the LAD.  Catheterization in January 2012  revealed vigorous left ventricle contractility with LVH.  There was evidence for significant mid LAD, mid systolic bridging with the vessel appeared normal during diastole and narrowing to 95% focally in the intramyocardial segment during systole.  There was slight progression of previously documented circumflex disease with the proximal AV groove circumflex narrowing to 50-60% and 30-40% narrowing in the AV groove circumflex after the second marginal branch.  Due to increasing symptomatology and an equivocal nuclear study repeat catheterization on 11/24/2014 was unchanged and showed again dynamic systolic bridging in the mid LAD intramyocardial segment, which now at 80-90% during systole and was 0% during diastole.  The mid AV groove circumflex had 50% stenosis in a dominant left circumflex system.  Over the last several years, he has done well.  He has undergone evaluation at [facility] for his primary disease and was diagnosed with emphysema.  He had recently felt that Crestor was contributing to his increasing dyspnea and with discontinuance of Crestor at this improved.  He has now been on pravastatin for hyperlipidemia and has been taking 20 mg daily.  He's no longer taking irbesartan for hypertension, which was changed from his previous dose of valsartan after impuriity issue.   He is not having any anginal symptoms.  He continues to take isosorbide 90 mg daily.  He has chronic right bundle branch block and first-degree AV block which is stable.  He has restless legs and is on requip 1 mg by mouth twice a day.  He continues to be on Zoloft for depression.  He has hypothyroidism on levothyroxine 175 g.  His breathing has improved with Anora ellipta.lipid studies in August 2018 had  shown an LDL of 69 when he was on Crestor.  Follow-up laboratory will be necessary with change to pravastatin.  I have recommended he follow-up with Billey Chang, PAC in 6 months  and see me in one year for cardiology reevaluation.   Time spent: 25 minutes  Troy Sine, MD, [facility]  05/04/2017 9:18 AM

## 2017-05-04 ENCOUNTER — Encounter: Payer: Self-pay | Admitting: Cardiovascular Disease

## 2017-06-28 ENCOUNTER — Encounter: Payer: Self-pay | Admitting: Pulmonary Disease

## 2017-06-28 ENCOUNTER — Ambulatory Visit: Payer: Medicare Other | Admitting: Pulmonary Disease

## 2017-06-28 VITALS — BP 118/64 | HR 55 | Ht 72.0 in | Wt 179.2 lb

## 2017-06-28 DIAGNOSIS — IMO0001 Reserved for inherently not codable concepts without codable children: Secondary | ICD-10-CM

## 2017-06-28 DIAGNOSIS — R911 Solitary pulmonary nodule: Secondary | ICD-10-CM

## 2017-06-28 DIAGNOSIS — J449 Chronic obstructive pulmonary disease, unspecified: Secondary | ICD-10-CM

## 2017-06-28 DIAGNOSIS — E8801 Alpha-1-antitrypsin deficiency: Secondary | ICD-10-CM

## 2017-06-28 DIAGNOSIS — Z148 Genetic carrier of other disease: Secondary | ICD-10-CM

## 2017-06-28 NOTE — Progress Notes (Signed)
Timothy Spencer    [id number]    January 15, 1941  Primary Care Physician:Pharr, Zollie Beckers, MD  Referring Physician: Merri Brunette, MD [address] [place], [place] [address]  Chief complaint: Follow-up for dyspnea, moderate COPD  HPI: 77 year old with moderate COPD, hypertension, hypothyroidism, restless leg syndrome, coronary artery disease.  Seen at [facility] pulmonary for evaluation of dyspnea in 2017.  He had a cardiopulmonary exercise test done there which showed deconditioning.  He has enrolled in an exercise program and works out 4 times a week.  Reports market improvement in symptoms of dyspnea. He was started on anoro 2 months ago by Dr. Jamison Neighbor.  He cannot tell if this is helping him.  His chief complaint today is mild dyspnea on exertion.  Denies any dyspnea at rest, cough, sputum production, wheezing.  Outpatient Encounter Medications as of 06/28/2017  Medication Sig  . cholecalciferol (VITAMIN D) 1000 units tablet Take 1,000 Units by mouth daily.  . irbesartan (AVAPRO) 75 MG tablet Take 1 tablet (75 mg total) by mouth daily.  . isosorbide mononitrate (IMDUR) 60 MG 24 hr tablet TAKE 1 AND 1/2 TABLETS (90 MG) BY MOUTH DAILY.  Marland Kitchen levothyroxine (SYNTHROID, LEVOTHROID) 175 MCG tablet Take 1 tablet by mouth daily.  . ranitidine (ZANTAC) 150 MG tablet Take 150 mg by mouth 2 (two) times daily.  Marland Kitchen rOPINIRole (REQUIP) 1 MG tablet Take 1 mg by mouth 2 (two) times daily.   . sertraline (ZOLOFT) 100 MG tablet Take 100 mg by mouth daily.   . tamsulosin (FLOMAX) 0.4 MG CAPS capsule Take 0.4 mg by mouth daily.   Marland Kitchen umeclidinium-vilanterol (ANORO ELLIPTA) 62.5-25 MCG/INH AEPB Inhale 1 puff into the lungs daily.  . pravastatin (PRAVACHOL) 20 MG tablet Take 1 tablet (20 mg total) by mouth every evening.   No facility-administered encounter medications on file as of 06/28/2017.     Allergies as of 06/28/2017  . (No Known Allergies)    Past Medical History:  Diagnosis  Date  . Anxiety and depression   . BPH (benign prostatic hyperplasia)   . CAD (coronary artery disease)   . COPD (chronic obstructive pulmonary disease) ([facility])   . DOE (dyspnea on exertion)   . ED (erectile dysfunction)   . Emphysema of lung ([facility])   . First degree AV block   . GERD (gastroesophageal reflux disease)   . Glaucoma   . Gout   . Gynecomastia   . H/O ETOH abuse   . Hemochromatosis   . Hyperlipidemia   . Hypertension   . Hypothyroidism   . Macular degeneration   . OSA (obstructive sleep apnea)   . Osteopenia   . Peripheral neuropathy   . RLS (restless legs syndrome)   . Thrombocytopenia ([facility])     Past Surgical History:  Procedure Laterality Date  . CARDIAC CATHETERIZATION  03/27/2008   Medical therapy  . CARDIAC CATHETERIZATION  06/03/2010   Medical theray and smoking cessation  . CARDIAC CATHETERIZATION N/A 11/24/2014   Procedure: Left Heart Cath and Coronary Angiography;  Surgeon: Lennette Bihari, MD;  Location: [facility];  Service: Cardiovascular;  Laterality: N/A;  . CARDIOPULMONARY EXERCISE TEST  02/12/2012   Peak VO2 63% of predicted  . ORIF FEMUR FRACTURE Right   . TRANSTHORACIC ECHOCARDIOGRAM  11/28/2011   EF >55%, normal    Family History  Adopted: Yes    Social History   Socioeconomic History  . Marital status: Married  Spouse name: Not on file  . Number of children: Not on file  . Years of education: Not on file  . Highest education level: Not on file  Social Needs  . Financial resource strain: Not on file  . Food insecurity - worry: Not on file  . Food insecurity - inability: Not on file  . Transportation needs - medical: Not on file  . Transportation needs - non-medical: Not on file  Occupational History  . Not on file  Tobacco Use  . Smoking status: Former Smoker    Packs/day: 2.00    Years: 50.00    Pack years: 100.00    Types: Cigarettes    Start date: 04/11/1960    Last attempt to quit: 03/19/2011    Years since  quitting: 6.2  . Smokeless tobacco: Never Used  . Tobacco comment: Quit for 1 year total   Substance and Sexual Activity  . Alcohol use: No    Alcohol/week: 0.0 oz  . Drug use: No  . Sexual activity: Not on file  Other Topics Concern  . Not on file  Social History Narrative   [facility] (03/06/17):   Originally from [place], [place]. Has always lived in [place]. Has traveled to [place], [place], [place], [place], [place], & [place]. Has a dog currently. No bird exposure. No mold or hot tub exposure. Previously enjoyed riding horses and playing golf. Currently has no hobbies. 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     Review of systems: Review of Systems  Constitutional: Negative for fever and chills.  HENT: Negative.   Eyes: Negative for blurred vision.  Respiratory: as per HPI  Cardiovascular: Negative for chest pain and palpitations.  Gastrointestinal: Negative for vomiting, diarrhea, blood per rectum. Genitourinary: Negative for dysuria, urgency, frequency and hematuria.  Musculoskeletal: Negative for myalgias, back pain and joint pain.  Skin: Negative for itching and rash.  Neurological: Negative for dizziness, tremors, focal weakness, seizures and loss of consciousness.  Endo/Heme/Allergies: Negative for environmental allergies.  Psychiatric/Behavioral: Negative for depression, suicidal ideas and hallucinations.  All other systems reviewed and are negative.  Physical Exam: Blood pressure 118/64, pulse (!) 55, height 6' (1.829 m), weight 179 lb 3.2 oz (81.3 kg), SpO2 100 %. Gen:      No acute distress HEENT:  EOMI, sclera anicteric Neck:     No masses; no thyromegaly Lungs:    Clear to auscultation bilaterally; normal respiratory effort CV:         Regular rate and rhythm; no murmurs Abd:      + bowel sounds; soft, non-tender; no palpable masses, no distension Ext:    No edema; adequate peripheral perfusion Skin:      Warm and  dry; no rash Neuro: alert and oriented x 3 Psych: normal mood and affect  Data Reviewed: PFTs  03/13/17- FVC 2.75 [61%], FEV1 1.92 (59%], F/F 70, TLC 80%, RV/TLC 143%, DLCO 47% 09/17/15- FVC 3.97%], FEV1 2.78 [83%], F/F 70, TLC 89%, DLCO 52%  04/11/17:  Walked 312 Meters / Baseline Sat 100% on RA / Nadir Sat 98% on RA  IMAGING CT chest 10/11/16- 4 mm left lower lobe nodule.  Previously noted lower lobe opacities have resolved.  Centrilobular emphysema.  Pleural thickening versus small effusion on the left CT chest 07/13/16-6 mm left lower lobe nodule.  9 mm left lower lobe nodule.  4 mm left lower lobe nodule.  Questionable pleural thickening versus  pleural effusion I have reviewed the images personally.  ESOPHAGRAM/BARIUM SWALLOW 09/10/15:  Small hiatal hernia with mild stricture at the gastroesophageal junction. Negative for reflux.  CARDIAC TTE (12/15/15):  LV normal in size with EF 60-65%. Impaired diastolic function of LV. LA & RA normal in size. RV normal in size & function. No aortic stenosis or regurgitation. Trace mitral regurgitation. Trace tricuspid regurgitation. Normal aortic root. No pericardial effusion. Trace pulmonic regurgitation.   LABS 03/06/17 Alpha-1 antitrypsin: MS (159)  01/29/09 ANA:  Negative RA:  79  Cardio pulmonary Exercise stress test 2017 from [facility] This cardiopulmonary exercise test demonstrated a decreased exercise capacity. The patient did not demonstrate a pulmonary limitation. Given the patient's anaerobic threshold > 40% with increased heart rate reserve, poor effort or deconditioning can account for the patient's symptoms. However, the patient did display a reduced oxygen pulse which is an indirect measure of stroke volume which may indicate early cardiovascular disease. Please note that the patient's baseline EKG displayed a RBBB limiting an evaluation of ST segment alterations during the procedure.  Assessment:  Moderate  COPD Stable on anoro.  Encouraged to continue exercise program  Left lower lobe pulmonary nodule. Schedule for follow-up low-dose screening CT in May 2019  Follow-up in 3 months.  Plan/Recommendations: - Continue anoro - Exercise program - Follow up low dose screening CT  Chilton Greathouse MD [facility] Pager [phone] 06/28/2017, 1:47 PM  CC: Merri Brunette, MD

## 2017-06-28 NOTE — Patient Instructions (Signed)
I am glad that your breathing is stable. Please continue to work on an exercise program Continue the Anoro inhaler We will make sure that the low-dose screening CT is ordered for May 2019 Follow-up in 3 months.

## 2017-08-07 DIAGNOSIS — H353132 Nonexudative age-related macular degeneration, bilateral, intermediate dry stage: Secondary | ICD-10-CM | POA: Diagnosis not present

## 2017-08-20 ENCOUNTER — Other Ambulatory Visit: Payer: Self-pay

## 2017-08-20 MED ORDER — ISOSORBIDE MONONITRATE ER 60 MG PO TB24: ORAL_TABLET | ORAL | 2 refills | Status: DC

## 2017-09-25 ENCOUNTER — Ambulatory Visit: Payer: Medicare Other | Admitting: Pulmonary Disease

## 2017-09-25 ENCOUNTER — Encounter: Payer: Self-pay | Admitting: Pulmonary Disease

## 2017-09-25 VITALS — BP 120/60 | HR 54 | Ht 72.0 in | Wt 173.0 lb

## 2017-09-25 DIAGNOSIS — R911 Solitary pulmonary nodule: Secondary | ICD-10-CM

## 2017-09-25 DIAGNOSIS — IMO0001 Reserved for inherently not codable concepts without codable children: Secondary | ICD-10-CM

## 2017-09-25 DIAGNOSIS — J449 Chronic obstructive pulmonary disease, unspecified: Secondary | ICD-10-CM | POA: Diagnosis not present

## 2017-09-25 DIAGNOSIS — Z148 Genetic carrier of other disease: Secondary | ICD-10-CM

## 2017-09-25 DIAGNOSIS — E8801 Alpha-1-antitrypsin deficiency: Secondary | ICD-10-CM

## 2017-09-25 NOTE — Progress Notes (Signed)
Timothy Spencer    [id number]    1941-02-06  Primary Care Physician:Pharr, Zollie Beckers, MD  Referring Physician: Merri Brunette, MD [address] [place], [place] [address]  Chief complaint: Follow-up for dyspnea, moderate COPD  HPI: 77 year old with moderate COPD, hypertension, hypothyroidism, restless leg syndrome, coronary artery disease.  Seen at [facility] pulmonary for evaluation of dyspnea in 2017.  He had a cardiopulmonary exercise test done there which showed deconditioning.  He has enrolled in an exercise program and works out 4 times a week.  Reports market improvement in symptoms of dyspnea. He was started on anoro 2 months ago by Dr. Jamison Neighbor.  He cannot tell if this is helping him.  His chief complaint today is mild dyspnea on exertion.  Denies any dyspnea at rest, cough, sputum production, wheezing.  Interim history: Ran out of his anoro 10 days ago.  Notes slightly increased dyspnea on exertion He continues to be active going to the gym 3-4 times a day to do aerobic and strength training.  Outpatient Encounter Medications as of 09/25/2017  Medication Sig  . cholecalciferol (VITAMIN D) 1000 units tablet Take 1,000 Units by mouth daily.  . irbesartan (AVAPRO) 75 MG tablet Take 1 tablet (75 mg total) by mouth daily.  . isosorbide mononitrate (IMDUR) 60 MG 24 hr tablet TAKE 1 AND 1/2 TABLETS (90 MG) BY MOUTH DAILY.  Marland Kitchen levothyroxine (SYNTHROID, LEVOTHROID) 175 MCG tablet Take 1 tablet by mouth daily.  . ranitidine (ZANTAC) 150 MG tablet Take 150 mg by mouth 2 (two) times daily.  Marland Kitchen rOPINIRole (REQUIP) 1 MG tablet Take 1 mg by mouth 2 (two) times daily.   . sertraline (ZOLOFT) 100 MG tablet Take 100 mg by mouth daily.   . tamsulosin (FLOMAX) 0.4 MG CAPS capsule Take 0.4 mg by mouth daily.   Marland Kitchen umeclidinium-vilanterol (ANORO ELLIPTA) 62.5-25 MCG/INH AEPB Inhale 1 puff into the lungs daily.  . pravastatin (PRAVACHOL) 20 MG tablet Take 1 tablet (20 mg total) by  mouth every evening.   No facility-administered encounter medications on file as of 09/25/2017.     Allergies as of 09/25/2017  . (No Known Allergies)    Past Medical History:  Diagnosis Date  . Anxiety and depression   . BPH (benign prostatic hyperplasia)   . CAD (coronary artery disease)   . COPD (chronic obstructive pulmonary disease) (HCC)   . DOE (dyspnea on exertion)   . ED (erectile dysfunction)   . Emphysema of lung (HCC)   . First degree AV block   . GERD (gastroesophageal reflux disease)   . Glaucoma   . Gout   . Gynecomastia   . H/O ETOH abuse   . Hemochromatosis   . Hyperlipidemia   . Hypertension   . Hypothyroidism   . Macular degeneration   . OSA (obstructive sleep apnea)   . Osteopenia   . Peripheral neuropathy   . RLS (restless legs syndrome)   . Thrombocytopenia (HCC)     Past Surgical History:  Procedure Laterality Date  . CARDIAC CATHETERIZATION  03/27/2008   Medical therapy  . CARDIAC CATHETERIZATION  06/03/2010   Medical theray and smoking cessation  . CARDIAC CATHETERIZATION N/A 11/24/2014   Procedure: Left Heart Cath and Coronary Angiography;  Surgeon: Lennette Bihari, MD;  Location: [facility] INVASIVE CV LAB;  Service: Cardiovascular;  Laterality: N/A;  . CARDIOPULMONARY EXERCISE TEST  02/12/2012   Peak VO2 63% of predicted  . ORIF FEMUR  FRACTURE Right   . TRANSTHORACIC ECHOCARDIOGRAM  11/28/2011   EF >55%, normal    Family History  Adopted: Yes    Social History   Socioeconomic History  . Marital status: Married    Spouse name: Not on file  . Number of children: Not on file  . Years of education: Not on file  . Highest education level: Not on file  Occupational History  . Not on file  Social Needs  . Financial resource strain: Not on file  . Food insecurity:    Worry: Not on file    Inability: Not on file  . Transportation needs:    Medical: Not on file    Non-medical: Not on file  Tobacco Use  . Smoking status: Former Smoker     Packs/day: 2.00    Years: 50.00    Pack years: 100.00    Types: Cigarettes    Start date: 04/11/1960    Last attempt to quit: 03/19/2011    Years since quitting: 6.5  . Smokeless tobacco: Never Used  . Tobacco comment: Quit for 1 year total   Substance and Sexual Activity  . Alcohol use: No    Alcohol/week: 0.0 oz  . Drug use: No  . Sexual activity: Not on file  Lifestyle  . Physical activity:    Days per week: Not on file    Minutes per session: Not on file  . Stress: Not on file  Relationships  . Social connections:    Talks on phone: Not on file    Gets together: Not on file    Attends religious service: Not on file    Active member of club or organization: Not on file    Attends meetings of clubs or organizations: Not on file    Relationship status: Not on file  . Intimate partner violence:    Fear of current or ex partner: Not on file    Emotionally abused: Not on file    Physically abused: Not on file    Forced sexual activity: Not on file  Other Topics Concern  . Not on file  Social History Narrative   [facility] (03/06/17):   Originally from [place], [place]. Has always lived in [place]. Has traveled to [place], [place], [place], [place], [place], & [place]. Has a dog currently. No bird exposure. No mold or hot tub exposure. Previously enjoyed riding horses and playing golf. Currently has no hobbies. 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     Review of systems: Review of Systems  Constitutional: Negative for fever and chills.  HENT: Negative.   Eyes: Negative for blurred vision.  Respiratory: as per HPI  Cardiovascular: Negative for chest pain and palpitations.  Gastrointestinal: Negative for vomiting, diarrhea, blood per rectum. Genitourinary: Negative for dysuria, urgency, frequency and hematuria.  Musculoskeletal: Negative for myalgias, back pain and joint pain.  Skin: Negative for itching and rash.    Neurological: Negative for dizziness, tremors, focal weakness, seizures and loss of consciousness.  Endo/Heme/Allergies: Negative for environmental allergies.  Psychiatric/Behavioral: Negative for depression, suicidal ideas and hallucinations.  All other systems reviewed and are negative.  Physical Exam: Blood pressure 120/60, pulse (!) 54, height 6' (1.829 m), weight 173 lb (78.5 kg), SpO2 97 %. Gen:      No acute distress HEENT:  EOMI, sclera anicteric Neck:     No masses; no thyromegaly Lungs:    Clear to auscultation bilaterally;  normal respiratory effort CV:         Regular rate and rhythm; no murmurs Abd:      + bowel sounds; soft, non-tender; no palpable masses, no distension Ext:    No edema; adequate peripheral perfusion Skin:      Warm and dry; no rash Neuro: alert and oriented x 3 Psych: normal mood and affect  Data Reviewed: PFTs  03/13/17- FVC 2.75 [61%], FEV1 1.92 (59%], F/F 70, TLC 80%, RV/TLC 143%, DLCO 47% 09/17/15- FVC 3.97%], FEV1 2.78 [83%], F/F 70, TLC 89%, DLCO 52%  04/11/17:  Walked 312 Meters / Baseline Sat 100% on RA / Nadir Sat 98% on RA  IMAGING CT chest 10/11/16- 4 mm left lower lobe nodule.  Previously noted lower lobe opacities have resolved.  Centrilobular emphysema.  Pleural thickening versus small effusion on the left CT chest 07/13/16-6 mm left lower lobe nodule.  9 mm left lower lobe nodule.  4 mm left lower lobe nodule.  Questionable pleural thickening versus pleural effusion I have reviewed the images personally.  ESOPHAGRAM/BARIUM SWALLOW 09/10/15:  Small hiatal hernia with mild stricture at the gastroesophageal junction. Negative for reflux.  CARDIAC TTE (12/15/15):  LV normal in size with EF 60-65%. Impaired diastolic function of LV. LA & RA normal in size. RV normal in size & function. No aortic stenosis or regurgitation. Trace mitral regurgitation. Trace tricuspid regurgitation. Normal aortic root. No pericardial effusion. Trace pulmonic  regurgitation.   LABS 03/06/17 Alpha-1 antitrypsin: MS (159)  01/29/09 ANA:  Negative RA:  79  Cardio pulmonary Exercise stress test 2017 from [facility] This cardiopulmonary exercise test demonstrated a decreased exercise capacity. The patient did not demonstrate a pulmonary limitation. Given the patient's anaerobic threshold > 40% with increased heart rate reserve, poor effort or deconditioning can account for the patient's symptoms. However, the patient did display a reduced oxygen pulse which is an indirect measure of stroke volume which may indicate early cardiovascular disease. Please note that the patient's baseline EKG displayed a RBBB limiting an evaluation of ST segment alterations during the procedure.  Assessment:  Moderate COPD Continue Anoro inhaler.  He will need a renewal of prescription Encouraged to continue his exercise program at the gym.    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  Last checked alpha-1 antitrypsin levels are normal.  We will continue to monitor.  Left lower lobe pulmonary nodule. Follow-up low-dose screening CT to be scheduled in May 2019  Health maintenance Patient states that he is up-to-date with his flu vaccine and pneumonia vaccine at his primary care.  Plan/Recommendations: - Continue anoro - Exercise program - Follow up low dose screening CT  Chilton Greathouse MD [facility] Pager [phone] 09/25/2017, 12:28 PM  CC: Merri Brunette, MD

## 2017-09-25 NOTE — Patient Instructions (Addendum)
We will make sure that the follow-up CT of the chest is ordered We will renew your stiolto inhaler We will check your oxygen levels on exertion today Follow-up in 6 months.

## 2017-09-27 ENCOUNTER — Telehealth: Payer: Self-pay | Admitting: Pulmonary Disease

## 2017-09-27 NOTE — Telephone Encounter (Signed)
lmtcb for pt. Will call in rx after verifying pharmacy-pharmacy listed is not on file for pt.

## 2017-09-28 MED ORDER — UMECLIDINIUM-VILANTEROL 62.5-25 MCG/INH IN AEPB: 1.0000 | INHALATION_SPRAY | Freq: Every day | RESPIRATORY_TRACT | 5 refills | Status: DC

## 2017-09-28 NOTE — Telephone Encounter (Signed)
Spoke with pt and advised rx sent to pharmacy. Nothing further is needed.   

## 2017-10-04 DIAGNOSIS — E039 Hypothyroidism, unspecified: Secondary | ICD-10-CM | POA: Diagnosis not present

## 2017-10-04 DIAGNOSIS — D696 Thrombocytopenia, unspecified: Secondary | ICD-10-CM | POA: Diagnosis not present

## 2017-10-04 DIAGNOSIS — E7800 Pure hypercholesterolemia, unspecified: Secondary | ICD-10-CM | POA: Diagnosis not present

## 2017-10-04 DIAGNOSIS — Z7982 Long term (current) use of aspirin: Secondary | ICD-10-CM | POA: Diagnosis not present

## 2017-10-09 DIAGNOSIS — D696 Thrombocytopenia, unspecified: Secondary | ICD-10-CM | POA: Diagnosis not present

## 2017-10-09 DIAGNOSIS — J342 Deviated nasal septum: Secondary | ICD-10-CM | POA: Diagnosis not present

## 2017-10-09 DIAGNOSIS — Z0001 Encounter for general adult medical examination with abnormal findings: Secondary | ICD-10-CM | POA: Diagnosis not present

## 2017-10-09 DIAGNOSIS — F339 Major depressive disorder, recurrent, unspecified: Secondary | ICD-10-CM | POA: Diagnosis not present

## 2017-10-09 DIAGNOSIS — M545 Low back pain: Secondary | ICD-10-CM | POA: Diagnosis not present

## 2017-10-12 ENCOUNTER — Ambulatory Visit
Admission: RE | Admit: 2017-10-12 | Discharge: 2017-10-12 | Disposition: A | Discharge status: Home / Self Care | Payer: Medicare Other | Source: Ambulatory Visit | Attending: Acute Care | Admitting: Acute Care

## 2017-10-12 DIAGNOSIS — Z87891 Personal history of nicotine dependence: Secondary | ICD-10-CM

## 2017-10-30 ENCOUNTER — Encounter (INDEPENDENT_AMBULATORY_CARE_PROVIDER_SITE_OTHER): Payer: Self-pay | Admitting: Orthopaedic Surgery

## 2017-10-30 ENCOUNTER — Ambulatory Visit (INDEPENDENT_AMBULATORY_CARE_PROVIDER_SITE_OTHER): Payer: PRIVATE HEALTH INSURANCE

## 2017-10-30 ENCOUNTER — Ambulatory Visit (INDEPENDENT_AMBULATORY_CARE_PROVIDER_SITE_OTHER): Payer: Medicare Other | Admitting: Orthopaedic Surgery

## 2017-10-30 VITALS — BP 145/59 | HR 45 | Ht 72.0 in | Wt 176.0 lb

## 2017-10-30 DIAGNOSIS — M545 Low back pain: Secondary | ICD-10-CM

## 2017-10-30 DIAGNOSIS — M4807 Spinal stenosis, lumbosacral region: Secondary | ICD-10-CM | POA: Diagnosis not present

## 2017-10-30 DIAGNOSIS — G8929 Other chronic pain: Secondary | ICD-10-CM | POA: Diagnosis not present

## 2017-10-31 ENCOUNTER — Encounter (INDEPENDENT_AMBULATORY_CARE_PROVIDER_SITE_OTHER): Payer: Self-pay | Admitting: Orthopaedic Surgery

## 2017-10-31 NOTE — Progress Notes (Signed)
Office Visit Note   Patient: Timothy Spencer           Date of Birth: 1940-10-08           MRN: [id number] Visit Date: 10/30/2017              Requested by: Merri Brunette, MD [address] [place], [place] [address] PCP: Merri Brunette, MD   Assessment & Plan: Visit Diagnoses:  1. Chronic midline low back pain, with sciatica presence unspecified   2. Spinal stenosis of lumbosacral region     Plan: We will obtain a lumbar MRI to evaluate him for lumbar spinal stenosis.  If this is negative I recommend arterial Dopplers.  Office follow-up after lumbar MRI.  Follow-Up Instructions: return after lumbar MRI   Orders:  Orders Placed This Encounter  Procedures  . XR Lumbar Spine 2-3 Views  . MR Lumbar Spine w/o contrast   No orders of the defined types were placed in this encounter.     Procedures: No procedures performed   Clinical Data: No additional findings.   Subjective: Chief Complaint  Patient presents with  . Lower Back - Pain    HPI 77 year old male with gradual progressive back pain leg weakness difficulty walking more than a block or standing more than 10 minutes over the last 10 years.  When he stands he has numbness in both feet and has to sit down due to progressive leg weakness and states if he does not sit he feels like he will fall.  He does better leaning over a grocery cart.  No associated bowel or bladder symptoms no fever or chills.  Review of Systems positive for COPD, hypertension, tobacco cigarette use, hypothyroidism, first-degree AV block, coronary artery disease, exertional dyspnea.  Lung nodule being followed on serial CT scan.  Positive for anxiety and depression.  14 point review of systems otherwise negative.   Objective: Vital Signs: BP (!) 145/59   Pulse (!) 45   Ht 6' (1.829 m)   Wt 176 lb (79.8 kg)   BMI 23.87 kg/m   Physical Exam  Constitutional: He is oriented to person, place, and time. He appears well-developed  and well-nourished.  HENT:  Head: Normocephalic and atraumatic.  Eyes: Pupils are equal, round, and reactive to light. EOM are normal.  Neck: No tracheal deviation present. No thyromegaly present.  Cardiovascular: Normal rate.  Pulmonary/Chest: Effort normal. No respiratory distress. He has no wheezes. He has no rales.  Abdominal: Soft. Bowel sounds are normal.  Neurological: He is alert and oriented to person, place, and time.  Skin: Skin is warm and dry. Capillary refill takes less than 2 seconds.  Psychiatric: He has a normal mood and affect. His behavior is normal. Judgment and thought content normal.    Ortho Exam patient ambulates with a forward flexed position at the hips.  No sciatic notch tenderness.  Anterior tib gastrocsoleus EHL is intact.  He has palpable posterior tib right and left.  Decreased dorsalis pedis bilaterally.  Specialty Comments:  No specialty comments available.  Imaging: Xr Lumbar Spine 2-3 Views  Result Date: 10/31/2017 AP lateral lumbar x-rays obtained and reviewed.  This shows multilevel lumbar disc degeneration with loss of disc space height and endplate sclerosis spurring asymmetrically without listhesis.  Pelvis and hip joints show no acute changes. 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    PMFS History: Patient Active Problem List   Diagnosis Date Noted  .  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 Lung nodule < 6cm on CT 03/06/2017  . COPD (chronic obstructive pulmonary disease) (HCC) 08/17/2015  . Personal history of tobacco use, presenting hazards to health 06/28/2015  . 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  . Essential hypertension 06/18/2015  . CAD in native artery   . Exertional dyspnea 11/21/2014  . Right bundle branch block 07/17/2014  . First degree AV block 07/17/2014  . CAD (coronary artery disease) 07/17/2014  . Fatigue 03/18/2013  . Hypothyroid 03/18/2013  .  Hyperlipidemia with target LDL less than 70 03/18/2013   Past Medical History:  Diagnosis Date  . Anxiety and depression   . BPH (benign prostatic hyperplasia)   . CAD (coronary artery disease)   . COPD (chronic obstructive pulmonary disease) (HCC)   . DOE (dyspnea on exertion)   . ED (erectile dysfunction)   . Emphysema of lung (HCC)   . First degree AV block   . GERD (gastroesophageal reflux disease)   . Glaucoma   . Gout   . Gynecomastia   . H/O ETOH abuse   . Hemochromatosis   . Hyperlipidemia   . Hypertension   . Hypothyroidism   . Macular degeneration   . OSA (obstructive sleep apnea)   . Osteopenia   . Peripheral neuropathy   . RLS (restless legs syndrome)   . Thrombocytopenia (HCC)     Family History  Adopted: Yes    Past Surgical History:  Procedure Laterality Date  . CARDIAC CATHETERIZATION  03/27/2008   Medical therapy  . CARDIAC CATHETERIZATION  06/03/2010   Medical theray and smoking cessation  . CARDIAC CATHETERIZATION N/A 11/24/2014   Procedure: Left Heart Cath and Coronary Angiography;  Surgeon: Lennette Bihari, MD;  Location: [facility] INVASIVE CV LAB;  Service: Cardiovascular;  Laterality: N/A;  . CARDIOPULMONARY EXERCISE TEST  02/12/2012   Peak VO2 63% of predicted  . ORIF FEMUR FRACTURE Right   . TRANSTHORACIC ECHOCARDIOGRAM  11/28/2011   EF >55%, normal   Social History   Occupational History  . Not on file  Tobacco Use  . Smoking status: Former Smoker    Packs/day: 2.00    Years: 50.00    Pack years: 100.00    Types: Cigarettes    Start date: 04/11/1960    Last attempt to quit: 03/19/2011    Years since quitting: 6.6  . Smokeless tobacco: Never Used  . Tobacco comment: Quit for 1 year total   Substance and Sexual Activity  . Alcohol use: No    Alcohol/week: 0.0 oz  . Drug use: No  . Sexual activity: Not on file

## 2017-11-12 ENCOUNTER — Ambulatory Visit: Payer: Medicare Other | Admitting: Physician Assistant

## 2017-11-23 ENCOUNTER — Ambulatory Visit
Admission: RE | Admit: 2017-11-23 | Discharge: 2017-11-23 | Disposition: A | Discharge status: Home / Self Care | Payer: Medicare Other | Source: Ambulatory Visit | Attending: Orthopaedic Surgery | Admitting: Orthopaedic Surgery

## 2017-11-23 DIAGNOSIS — M48061 Spinal stenosis, lumbar region without neurogenic claudication: Secondary | ICD-10-CM | POA: Diagnosis not present

## 2017-11-23 DIAGNOSIS — M4807 Spinal stenosis, lumbosacral region: Secondary | ICD-10-CM

## 2017-11-27 ENCOUNTER — Ambulatory Visit (INDEPENDENT_AMBULATORY_CARE_PROVIDER_SITE_OTHER): Payer: Medicare Other | Admitting: Orthopaedic Surgery

## 2017-11-27 ENCOUNTER — Encounter (INDEPENDENT_AMBULATORY_CARE_PROVIDER_SITE_OTHER): Payer: Self-pay | Admitting: Orthopaedic Surgery

## 2017-11-27 VITALS — BP 131/63 | HR 88 | Ht 72.0 in | Wt 176.0 lb

## 2017-11-27 DIAGNOSIS — M5136 Other intervertebral disc degeneration, lumbar region: Secondary | ICD-10-CM | POA: Diagnosis not present

## 2017-11-27 NOTE — Progress Notes (Signed)
Office Visit Note   Patient: Timothy Spencer           Date of Birth: Oct 18, 1940           MRN: [id number] Visit Date: 11/27/2017              Requested by: Merri Brunette, MD [address] [place], [place] [address] PCP: Merri Brunette, MD   Assessment & Plan: Visit Diagnoses:  1. Other intervertebral disc degeneration, lumbar region     Plan: We reviewed the MRI scan which showed multilevel changes with varying degrees of mild to moderate narrowing.  He is autofused at 5 1 has foraminal stenosis at 4 5 some disc protrusion at L2-3 with some mild to moderate stenosis.  No instability problems no severe stenosis.  We discussed a gradual walking program where he tries to walk for 15 to 18 minutes sit and rest and then repeat.  He can call if he like to try a single epidural.  He can return if he has increase in symptoms.  Follow-Up Instructions: Return if symptoms worsen or fail to improve.   Orders:  No orders of the defined types were placed in this encounter.  No orders of the defined types were placed in this encounter.     Procedures: No procedures performed   Clinical Data: No additional findings.   Subjective: Chief Complaint  Patient presents with  . Lower Back - Pain    MRI Lumbar review    HPI patient returns with ongoing claudication type symptoms after walking 8 minutes on the treadmill which she does 4 times a day.  When he sits for 10 minutes he gets relief.  He has some exertional dyspnea as well with COPD coronary artery disease.  Previous arterial Dopplers showed ABI of right 1.17 and left 1.27.  He principally has back pain when he walks and gets better with sitting.  New MRI scan lumbar has been obtained and is available for review.  Review of Systems  14 point update unchanged from 10/30/2017 office visit other than as mentioned in HPI.   Objective: Vital Signs: BP 131/63   Pulse 88   Ht 6' (1.829 m)   Wt 176 lb (79.8 kg)   BMI 23.87  kg/m   Physical Exam  Constitutional: He is oriented to person, place, and time. He appears well-developed and well-nourished.  HENT:  Head: Normocephalic and atraumatic.  Eyes: Pupils are equal, round, and reactive to light. EOM are normal.  Neck: No tracheal deviation present. No thyromegaly present.  Cardiovascular: Normal rate.  Pulmonary/Chest: Effort normal. He has no wheezes.  Abdominal: Soft. Bowel sounds are normal.  Neurological: He is alert and oriented to person, place, and time.  Skin: Skin is warm and dry. Capillary refill takes less than 2 seconds.  Psychiatric: He has a normal mood and affect. His behavior is normal. Judgment and thought content normal.    Ortho Exam 1+ dorsalis pedis right and left.  He ambulates with a forward flexed position lumbar spine.  Anterior tib gastrocsoleus is intact negative logroll to the hips.  No rash over exposed skin.  Specialty Comments:  No specialty comments available.  Imaging: Study Result   CLINICAL DATA:  Progressive low back pain and leg weakness with difficulty walking. Numbness in the feet with standing.  EXAM: MRI LUMBAR SPINE WITHOUT CONTRAST  TECHNIQUE: Multiplanar, multisequence MR imaging of the lumbar spine was performed. No intravenous contrast was administered.  COMPARISON:  Lumbar spine radiographs 10/30/2017  FINDINGS: Segmentation:  Standard.  Alignment: Straightening of the normal lumbar lordosis. Slight lumbar dextroscoliosis. Trace retrolisthesis of L1 on L2, L2 on L3, L3 on L4, and L4 on L5.  Vertebrae: Mild T12 and L2 superior endplate compression fracture/Schmorl's node deformities, chronic in appearance. No evidence of acute fracture. Multilevel degenerative endplate changes including degenerative edema at L2-3 and L3-4. Interbody ankylosis at L5-S1.  Conus medullaris and cauda equina: Conus extends to the L1-2 level. Conus and cauda equina appear normal.  Paraspinal and other  soft tissues: Numerous small T2 hyperintense lesions in both kidneys, likely cysts but incompletely evaluated. Prominent bladder distension. Fatty atrophy of the posterior paraspinal musculature.  Disc levels:  Disc desiccation and severe disc space narrowing throughout the lumbar spine.  T11-12: Mild rightward disc bulging, endplate spurring, and moderate right facet arthrosis result in mild right neural foraminal stenosis without spinal stenosis PRE  T12-L1: Trace disc bulging without stenosis.  L1-2: Central disc extrusion posterior to the L1 vertebral body extending to both the T12-L1 and L1-2 disc space levels, favored to arise from the L1-2 disc though this is not certain. The disc extrusion does not result in neural impingement. Mild circumferential disc bulging and mild facet hypertrophy without stenosis.  L2-3: Circumferential disc bulging, superimposed central disc protrusion, and mild facet and ligamentum flavum hypertrophy result in mild spinal stenosis, mild bilateral lateral recess stenosis, and mild left neural foraminal stenosis.  L3-4: Circumferential disc bulging and mild-to-moderate facet and ligamentum flavum hypertrophy result in mild bilateral lateral recess stenosis and mild bilateral neural foraminal stenosis without significant spinal stenosis. There is a moderate-sized right subarticular disc extrusion with caudal migration to the mid L4 vertebral body level which results in moderate right lateral recess stenosis and potential right L4 nerve root impingement.  L4-5: Circumferential disc bulging, a small left paracentral disc extrusion with slight superior migration, a small right paracentral to subarticular disc extrusion with slight inferior migration, and moderate facet and ligamentum flavum hypertrophy result in moderate bilateral lateral recess stenosis, mild spinal stenosis, and mild right and minimal left neural foraminal stenosis.  Potential left L5 nerve root impingement bilaterally by the disc extrusions.  L5-S1: Interbody ankylosis with spurring and mild facet arthrosis. No stenosis.  IMPRESSION: 1. Severe disc degeneration throughout the lumbar spine with ankylosis at L5-S1. 2. Bilateral L4-5 disc extrusions with lateral recess stenosis and potential bilateral L5 nerve root impingement. 3. Right subarticular disc extrusion at L3-4 with potential right L4 nerve root impingement. 4. Mild spinal stenosis at L2-3. 5. Central disc extrusion behind the L1 vertebral body without stenosis.   Electronically Signed   By: Sebastian Ache M.D.   On: 11/23/2017 17:11      PMFS History: Patient Active Problem List   Diagnosis Date Noted  . 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 Lung nodule < 6cm on CT 03/06/2017  . COPD (chronic obstructive pulmonary disease) (HCC) 08/17/2015  . Personal history of tobacco use, presenting hazards to health 06/28/2015  . 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  . Essential hypertension 06/18/2015  . CAD in native artery   . Exertional dyspnea 11/21/2014  . Right bundle branch block 07/17/2014  . First degree AV block 07/17/2014  . CAD (coronary artery disease) 07/17/2014  . Fatigue 03/18/2013  . Hypothyroid 03/18/2013  . Hyperlipidemia with target LDL less than 70 03/18/2013   Past Medical History:  Diagnosis Date  . Anxiety and depression   . BPH (benign prostatic hyperplasia)   .  CAD (coronary artery disease)   . COPD (chronic obstructive pulmonary disease) (HCC)   . DOE (dyspnea on exertion)   . ED (erectile dysfunction)   . Emphysema of lung (HCC)   . First degree AV block   . GERD (gastroesophageal reflux disease)   . Glaucoma   . Gout   . Gynecomastia   . H/O ETOH abuse   . Hemochromatosis   . Hyperlipidemia   . Hypertension   . Hypothyroidism   . Macular degeneration   . OSA (obstructive sleep apnea)   . Osteopenia   . Peripheral  neuropathy   . RLS (restless legs syndrome)   . Thrombocytopenia (HCC)     Family History  Adopted: Yes    Past Surgical History:  Procedure Laterality Date  . CARDIAC CATHETERIZATION  03/27/2008   Medical therapy  . CARDIAC CATHETERIZATION  06/03/2010   Medical theray and smoking cessation  . CARDIAC CATHETERIZATION N/A 11/24/2014   Procedure: Left Heart Cath and Coronary Angiography;  Surgeon: Lennette Bihari, MD;  Location: [facility] INVASIVE CV LAB;  Service: Cardiovascular;  Laterality: N/A;  . CARDIOPULMONARY EXERCISE TEST  02/12/2012   Peak VO2 63% of predicted  . ORIF FEMUR FRACTURE Right   . TRANSTHORACIC ECHOCARDIOGRAM  11/28/2011   EF >55%, normal   Social History   Occupational History  . Not on file  Tobacco Use  . Smoking status: Former Smoker    Packs/day: 2.00    Years: 50.00    Pack years: 100.00    Types: Cigarettes    Start date: 04/11/1960    Last attempt to quit: 03/19/2011    Years since quitting: 6.6  . Smokeless tobacco: Never Used  . Tobacco comment: Quit for 1 year total   Substance and Sexual Activity  . Alcohol use: No    Alcohol/week: 0.0 oz  . Drug use: No  . Sexual activity: Not on file

## 2017-12-04 ENCOUNTER — Ambulatory Visit: Payer: Medicare Other | Admitting: Physician Assistant

## 2017-12-04 ENCOUNTER — Encounter: Payer: Self-pay | Admitting: Physician Assistant

## 2017-12-04 VITALS — BP 116/54 | HR 81 | Ht 72.0 in | Wt 171.6 lb

## 2017-12-04 DIAGNOSIS — E785 Hyperlipidemia, unspecified: Secondary | ICD-10-CM

## 2017-12-04 DIAGNOSIS — E039 Hypothyroidism, unspecified: Secondary | ICD-10-CM

## 2017-12-04 DIAGNOSIS — I10 Essential (primary) hypertension: Secondary | ICD-10-CM | POA: Diagnosis not present

## 2017-12-04 DIAGNOSIS — I2510 Atherosclerotic heart disease of native coronary artery without angina pectoris: Secondary | ICD-10-CM | POA: Diagnosis not present

## 2017-12-04 NOTE — Progress Notes (Signed)
Cardiology Office Note    Date:  12/06/2017   ID:  Timothy Spencer, DOB July 26, 1940, MRN [id number]  PCP:  Timothy Brunette, MD  Cardiologist:  Dr. Tresa Endo    Chief Complaint  Patient presents with  . Follow-up    seen for Dr. Tresa Endo.     History of Present Illness:  Timothy Spencer is a 77 y.o. male with PMH of first degree AV block, HTN, HLD, hypothyroidism, OSA, CAD, RBBB and RLS. He has a long-standing history of tobacco abuse however quit smoking in May 2012 after smoking for 40 years. He undergone cardiac catheterization in 2009 and subsequently January 2012 which showed mild CAD with mid systolic bridging of his LAD narrowed up to 95% during systole and was essentially normal during diastole, 50-60% left circumflex stenosis, 30-40% mid AV groove stenosis, he also has nondominant RCA. He benefited with the addition of Ranexa to his medical regimen. A cardiopulmonary stress test subsequently suggested ischemic response. Due to significant change in his ability to exercise, he was evaluated by pulmonary service, he underwent pulmonary function study which revealed airway obstruction suggesting emphysema however absence overinflation was inconsistent with that diagnosis. It was not believed that his pulmonary function was responsible for his abrupt change with exercise intolerance. He had a Myoview on 10/28/2014 whichwas abnormal with large severe fixed inferior defect consistent with prior MI, however there does appears to be inferior wall motion at the same time, this was interpreted as intermediate risk study, EF hyperdynamic >65%. He was evaluated in the cardiology office on 11/20/2014, his Imdur was increased to 60 mg daily which has since been increased to 90mg  daily. Given symptom change with exertion and ambiguous myoview result, cardiac catheterization was suggested. Cardiac catheterization performed on 11/16/2014 showed 2 vessel disease with 50% mid left circumflex lesion, 90% mid to distal  LAD lesion secondary to dynamic systolic bridging which turn into a 0% during diastole. Medical therapy was recommended.  Last echo obtained in July 2017 showed EF 60 to 65%, no significant valvular issue.    I last saw the patient in September 2018, he self discontinued his Crestor as he was convinced Crestor was causing the increased dyspnea on exertion.  We discussed alternative therapy, eventually I started him on Pravachol 20 mg daily.  He presents today for his six-month follow-up.  He is tolerating Pravachol quite well.  He denies any recent chest discomfort or shortness of breath.  He denies any lower extremity at edema, orthopnea or PND.    Past Medical History:  Diagnosis Date  . Anxiety and depression   . BPH (benign prostatic hyperplasia)   . CAD (coronary artery disease)   . COPD (chronic obstructive pulmonary disease) (HCC)   . DOE (dyspnea on exertion)   . ED (erectile dysfunction)   . Emphysema of lung (HCC)   . First degree AV block   . GERD (gastroesophageal reflux disease)   . Glaucoma   . Gout   . Gynecomastia   . H/O ETOH abuse   . Hemochromatosis   . Hyperlipidemia   . Hypertension   . Hypothyroidism   . Macular degeneration   . OSA (obstructive sleep apnea)   . Osteopenia   . Peripheral neuropathy   . RLS (restless legs syndrome)   . Thrombocytopenia (HCC)     Past Surgical History:  Procedure Laterality Date  . CARDIAC CATHETERIZATION  03/27/2008   Medical therapy  . CARDIAC CATHETERIZATION  06/03/2010   Medical theray  and smoking cessation  . CARDIAC CATHETERIZATION N/A 11/24/2014   Procedure: Left Heart Cath and Coronary Angiography;  Surgeon: Lennette Bihari, MD;  Location: [facility] INVASIVE CV LAB;  Service: Cardiovascular;  Laterality: N/A;  . CARDIOPULMONARY EXERCISE TEST  02/12/2012   Peak VO2 63% of predicted  . ORIF FEMUR FRACTURE Right   . TRANSTHORACIC ECHOCARDIOGRAM  11/28/2011   EF >55%, normal    Current Medications: Outpatient Medications  Prior to Visit  Medication Sig Dispense Refill  . aspirin EC 81 MG tablet Take 81 mg by mouth daily.    . cholecalciferol (VITAMIN D) 1000 units tablet Take 1,000 Units by mouth daily.    . isosorbide mononitrate (IMDUR) 60 MG 24 hr tablet TAKE 1 AND 1/2 TABLETS (90 MG) BY MOUTH DAILY. 135 tablet 2  . levothyroxine (SYNTHROID, LEVOTHROID) 175 MCG tablet Take 1 tablet by mouth daily.  5  . ranitidine (ZANTAC) 150 MG tablet Take 150 mg by mouth 2 (two) times daily.    . ranolazine (RANEXA) 500 MG 12 hr tablet Take 500 mg by mouth daily.     Marland Kitchen rOPINIRole (REQUIP) 1 MG tablet Take 1 mg by mouth 2 (two) times daily.     . sertraline (ZOLOFT) 100 MG tablet Take 100 mg by mouth daily.     . tamsulosin (FLOMAX) 0.4 MG CAPS capsule Take 0.4 mg by mouth daily.     . traMADol (ULTRAM) 50 MG tablet 1 TABLET BY MOUTH DAILY  0  . umeclidinium-vilanterol (ANORO ELLIPTA) 62.5-25 MCG/INH AEPB Inhale 1 puff into the lungs daily. 60 each 5  . irbesartan (AVAPRO) 75 MG tablet Take 1 tablet (75 mg total) by mouth daily. 30 tablet 5  . rosuvastatin (CRESTOR) 5 MG tablet Take 5 mg by mouth every other day.  1  . pravastatin (PRAVACHOL) 20 MG tablet Take 1 tablet (20 mg total) by mouth every evening. 30 tablet 5  . ASPIRIN LOW DOSE 81 MG EC tablet CSW 1 T PO QD  6   No facility-administered medications prior to visit.      Allergies:   Patient has no known allergies.   Social History   Socioeconomic History  . Marital status: Married    Spouse name: Not on file  . Number of children: Not on file  . Years of education: Not on file  . Highest education level: Not on file  Occupational History  . Not on file  Social Needs  . Financial resource strain: Not on file  . Food insecurity:    Worry: Not on file    Inability: Not on file  . Transportation needs:    Medical: Not on file    Non-medical: Not on file  Tobacco Use  . Smoking status: Former Smoker    Packs/day: 2.00    Years: 50.00    Pack  years: 100.00    Types: Cigarettes    Start date: 04/11/1960    Last attempt to quit: 03/19/2011    Years since quitting: 6.7  . Smokeless tobacco: Never Used  . Tobacco comment: Quit for 1 year total   Substance and Sexual Activity  . Alcohol use: No    Alcohol/week: 0.0 oz  . Drug use: No  . Sexual activity: Not on file  Lifestyle  . Physical activity:    Days per week: Not on file    Minutes per session: Not on file  . Stress: Not on file  Relationships  . Social connections:  Talks on phone: Not on file    Gets together: Not on file    Attends religious service: Not on file    Active member of club or organization: Not on file    Attends meetings of clubs or organizations: Not on file    Relationship status: Not on file  Other Topics Concern  . Not on file  Social History Narrative   [facility] (03/06/17):   Originally from [place], [place]. Has always lived in [place]. Has traveled to [place], [place], [place], [place], [place], & [place]. Has a dog currently. No bird exposure. No mold or hot tub exposure. Previously enjoyed riding horses and playing golf. Currently has no hobbies. 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      Family History:  The patient's family history is not on file. He was adopted.   ROS:   Please see the history of present illness.    ROS All other systems reviewed and are negative.   PHYSICAL EXAM:   VS:  BP (!) 116/54   Pulse 81   Ht 6' (1.829 m)   Wt 171 lb 9.6 oz (77.8 kg)   SpO2 97%   BMI 23.27 kg/m    GEN: Well nourished, well developed, in no acute distress  HEENT: normal  Neck: no JVD, carotid bruits, or masses Cardiac: RRR; no murmurs, rubs, or gallops,no edema  Respiratory:  clear to auscultation bilaterally, normal work of breathing GI: soft, nontender, nondistended, + BS MS: no deformity or atrophy  Skin: warm and dry, no rash Neuro:  Alert and Oriented x 3, Strength and  sensation are intact Psych: euthymic mood, full affect  Wt Readings from Last 3 Encounters:  12/04/17 171 lb 9.6 oz (77.8 kg)  11/27/17 176 lb (79.8 kg)  10/30/17 176 lb (79.8 kg)      Studies/Labs Reviewed:   EKG:  EKG is not ordered today.    Recent Labs: No results found for requested labs within last 8760 hours.   Lipid Panel No results found for: CHOL, TRIG, HDL, CHOLHDL, VLDL, LDLCALC, LDLDIRECT  Additional studies/ records that were reviewed today include:   Cath 11/24/2014  Mid Cx lesion, 50% stenosed.  Mid LAD to Dist LAD lesion, 90% stenosed.   Normal LV function.  2 vessel coronary obstructive disease with dynamic systolic bridging in a mid LAD intramyocardial segment which narrows to 80-90% during systole and 0% during diastole; and 50% mid AV groove circumflex stenosis and a left dominant circumflex system.  RECOMMENDATION:  Medical therapy.    ASSESSMENT:    1. Coronary artery disease involving native coronary artery of native heart without angina pectoris   2. Essential hypertension   3. Hyperlipidemia, unspecified hyperlipidemia type   4. Hypothyroidism, unspecified type      PLAN:  In order of problems listed above:  1. CAD: On aspirin and Pravachol.  Denies any recent chest discomfort or exertional shortness of breath.  2. Hypertension: Blood pressure well controlled  3. Hyperlipidemia: Could not tolerate Crestor, I switched him to Pravachol instead.  Continue on current medication.    4. Hypothyroidism: On Synthroid, managed by primary care provider.    Medication Adjustments/Labs and Tests Ordered: Current medicines are reviewed at length with the patient today.  Concerns regarding medicines are outlined above.  Medication changes, Labs and Tests ordered today are listed in the Patient Instructions below. Patient Instructions  Medication Instructions:  Your physician  recommends that you continue on your current medications as  directed. Please refer to the Current Medication list given to you today.  Follow-Up: Your physician wants you to follow-up in: 6 months with Dr. Tresa Endo.  You will receive a reminder letter in the mail two months in advance. If you don't receive a letter, please call our office to schedule the follow-up appointment.   Any Other Special Instructions Will Be Listed Below (If Applicable).     If you need a refill on your cardiac medications before your next appointment, please call your pharmacy.      Ramond Dial, [place]  12/06/2017 11:47 PM    [facility] [address] [place], [place], [place]  [address] Phone: [phone]; Fax: [phone]

## 2017-12-04 NOTE — Patient Instructions (Signed)

## 2017-12-06 ENCOUNTER — Encounter: Payer: Self-pay | Admitting: Physician Assistant

## 2017-12-07 DIAGNOSIS — D485 Neoplasm of uncertain behavior of skin: Secondary | ICD-10-CM | POA: Diagnosis not present

## 2017-12-07 DIAGNOSIS — H353133 Nonexudative age-related macular degeneration, bilateral, advanced atrophic without subfoveal involvement: Secondary | ICD-10-CM | POA: Diagnosis not present

## 2017-12-07 DIAGNOSIS — H349 Unspecified retinal vascular occlusion: Secondary | ICD-10-CM | POA: Diagnosis not present

## 2017-12-10 ENCOUNTER — Other Ambulatory Visit: Payer: Self-pay | Admitting: Ophthalmology

## 2017-12-10 DIAGNOSIS — D485 Neoplasm of uncertain behavior of skin: Secondary | ICD-10-CM | POA: Diagnosis not present

## 2017-12-10 DIAGNOSIS — C441192 Basal cell carcinoma of skin of left lower eyelid, including canthus: Secondary | ICD-10-CM | POA: Diagnosis not present

## 2017-12-12 DIAGNOSIS — M5137 Other intervertebral disc degeneration, lumbosacral region: Secondary | ICD-10-CM | POA: Diagnosis not present

## 2017-12-13 ENCOUNTER — Other Ambulatory Visit: Payer: Self-pay | Admitting: Ophthalmology

## 2017-12-13 DIAGNOSIS — H349 Unspecified retinal vascular occlusion: Secondary | ICD-10-CM

## 2017-12-19 ENCOUNTER — Ambulatory Visit
Admission: RE | Admit: 2017-12-19 | Discharge: 2017-12-19 | Disposition: A | Discharge status: Home / Self Care | Payer: Medicare Other | Source: Ambulatory Visit | Attending: Ophthalmology | Admitting: Ophthalmology

## 2017-12-19 DIAGNOSIS — H349 Unspecified retinal vascular occlusion: Secondary | ICD-10-CM

## 2017-12-19 DIAGNOSIS — I6523 Occlusion and stenosis of bilateral carotid arteries: Secondary | ICD-10-CM | POA: Diagnosis not present

## 2018-01-03 DIAGNOSIS — H353134 Nonexudative age-related macular degeneration, bilateral, advanced atrophic with subfoveal involvement: Secondary | ICD-10-CM | POA: Diagnosis not present

## 2018-02-14 DIAGNOSIS — Z1211 Encounter for screening for malignant neoplasm of colon: Secondary | ICD-10-CM | POA: Diagnosis not present

## 2018-02-14 DIAGNOSIS — K5730 Diverticulosis of large intestine without perforation or abscess without bleeding: Secondary | ICD-10-CM | POA: Diagnosis not present

## 2018-02-14 DIAGNOSIS — Z86010 Personal history of colonic polyps: Secondary | ICD-10-CM | POA: Diagnosis not present

## 2018-03-08 DIAGNOSIS — C441192 Basal cell carcinoma of skin of left lower eyelid, including canthus: Secondary | ICD-10-CM | POA: Diagnosis not present

## 2018-03-25 ENCOUNTER — Encounter: Payer: Self-pay | Admitting: Pulmonary Disease

## 2018-03-25 ENCOUNTER — Ambulatory Visit (INDEPENDENT_AMBULATORY_CARE_PROVIDER_SITE_OTHER): Payer: Medicare Other | Admitting: Pulmonary Disease

## 2018-03-25 ENCOUNTER — Telehealth: Payer: Self-pay | Admitting: Pulmonary Disease

## 2018-03-25 VITALS — BP 120/58 | HR 79 | Ht 72.0 in | Wt 167.0 lb

## 2018-03-25 DIAGNOSIS — J449 Chronic obstructive pulmonary disease, unspecified: Secondary | ICD-10-CM | POA: Diagnosis not present

## 2018-03-25 DIAGNOSIS — Z148 Genetic carrier of other disease: Secondary | ICD-10-CM

## 2018-03-25 DIAGNOSIS — Z23 Encounter for immunization: Secondary | ICD-10-CM

## 2018-03-25 MED ORDER — FLUTICASONE-UMECLIDIN-VILANT 100-62.5-25 MCG/INH IN AEPB: 1.0000 | INHALATION_SPRAY | Freq: Every day | RESPIRATORY_TRACT | 4 refills | Status: DC

## 2018-03-25 NOTE — Patient Instructions (Signed)
We will stop the Anoro and start you on trelegy inhaler We will give a flu vaccine today Follow-up in 3 months with pulmonary function test.

## 2018-03-25 NOTE — Telephone Encounter (Signed)
Contacted pt's PCP and left message to obtain PNA vaccine dates.  Will f/u on

## 2018-03-25 NOTE — Progress Notes (Signed)
Timothy Spencer    [id number]    1941-04-05  Primary Care Physician:Pharr, Zollie Beckers, MD  Referring Physician: Merri Brunette, MD [address] [place], [place] [address]  Chief complaint: Follow-up for dyspnea, moderate COPD  HPI: 77 year old with moderate COPD, hypertension, hypothyroidism, restless leg syndrome, coronary artery disease.  Seen at [facility] pulmonary for evaluation of dyspnea in 2017.  He had a cardiopulmonary exercise test done there which showed deconditioning.  He has enrolled in an exercise program and works out 4 times a week.  Reports market improvement in symptoms of dyspnea. He was started on anoro 2 months ago by Dr. Jamison Neighbor.  He cannot tell if this is helping him.  His chief complaint today is mild dyspnea on exertion.  Denies any dyspnea at rest, cough, sputum production, wheezing.  Interim history: He continues to be active going to the gym 3-4 times a day to do aerobic and strength training. Notes increasing dyspnea while walking up inclines over the past few months He does not know if the Anoro is helping with symptoms  Outpatient Encounter Medications as of 03/25/2018  Medication Sig  . aspirin EC 81 MG tablet Take 81 mg by mouth daily.  . cholecalciferol (VITAMIN D) 1000 units tablet Take 1,000 Units by mouth daily.  . isosorbide mononitrate (IMDUR) 60 MG 24 hr tablet TAKE 1 AND 1/2 TABLETS (90 MG) BY MOUTH DAILY.  Marland Kitchen levothyroxine (SYNTHROID, LEVOTHROID) 175 MCG tablet Take 1 tablet by mouth daily.  . ranitidine (ZANTAC) 150 MG tablet Take 150 mg by mouth 2 (two) times daily.  . ranolazine (RANEXA) 500 MG 12 hr tablet Take 500 mg by mouth daily.   Marland Kitchen rOPINIRole (REQUIP) 1 MG tablet Take 1 mg by mouth 2 (two) times daily.   . sertraline (ZOLOFT) 100 MG tablet Take 100 mg by mouth daily.   . tamsulosin (FLOMAX) 0.4 MG CAPS capsule Take 0.4 mg by mouth daily.   . traMADol (ULTRAM) 50 MG tablet 1 TABLET BY MOUTH DAILY  .  umeclidinium-vilanterol (ANORO ELLIPTA) 62.5-25 MCG/INH AEPB Inhale 1 puff into the lungs daily.  . pravastatin (PRAVACHOL) 20 MG tablet Take 1 tablet (20 mg total) by mouth every evening.   No facility-administered encounter medications on file as of 03/25/2018.    Physical Exam: Blood pressure (!) 120/58, pulse 79, height 6' (1.829 m), weight 167 lb (75.8 kg), SpO2 98 %. Gen:      No acute distress HEENT:  EOMI, sclera anicteric Neck:     No masses; no thyromegaly Lungs:    Clear to auscultation bilaterally; normal respiratory effort CV:         Regular rate and rhythm; no murmurs Abd:      + bowel sounds; soft, non-tender; no palpable masses, no distension Ext:    No edema; adequate peripheral perfusion Skin:      Warm and dry; no rash Neuro: alert and oriented x 3 Psych: normal mood and affect  Data Reviewed: PFTs  03/13/17- FVC 2.75 [61%], FEV1 1.92 (59%], F/F 70, TLC 80%, RV/TLC 143%, DLCO 47% 09/17/15- FVC 3.97%], FEV1 2.78 [83%], F/F 70, TLC 89%, DLCO 52%  04/11/17:  Walked 312 Meters / Baseline Sat 100% on RA / Nadir Sat 98% on RA  Imaging CT chest 10/11/16- 4 mm left lower lobe nodule.  Previously noted lower lobe opacities have resolved.  Centrilobular emphysema.  Pleural thickening versus small effusion on the left CT chest  07/13/16-6 mm left lower lobe nodule.  9 mm left lower lobe nodule.  4 mm left lower lobe nodule.  Questionable pleural thickening versus pleural effusion I have reviewed the images personally.  ESOPHAGRAM/BARIUM SWALLOW 09/10/15:  Small hiatal hernia with mild stricture at the gastroesophageal junction. Negative for reflux.  CARDIAC TTE (12/15/15):  LV normal in size with EF 60-65%. Impaired diastolic function of LV. LA & RA normal in size. RV normal in size & function. No aortic stenosis or regurgitation. Trace mitral regurgitation. Trace tricuspid regurgitation. Normal aortic root. No pericardial effusion. Trace pulmonic regurgitation.    LABS 03/06/17 Alpha-1 antitrypsin: MS (159)  01/29/09 ANA:  Negative RA:  79  Cardio pulmonary Exercise stress test 2017 from [facility] This cardiopulmonary exercise test demonstrated a decreased exercise capacity. The patient did not demonstrate a pulmonary limitation. Given the patient's anaerobic threshold > 40% with increased heart rate reserve, poor effort or deconditioning can account for the patient's symptoms. However, the patient did display a reduced oxygen pulse which is an indirect measure of stroke volume which may indicate early cardiovascular disease. Please note that the patient's baseline EKG displayed a RBBB limiting an evaluation of ST segment alterations during the procedure.  Assessment:  Moderate COPD We will stop Anoro and switch to Trelegy as he notes worsening dyspnea on exertion Encouraged to continue exercise program at the gym Get repeat pulmonary function test for reassessment.   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  Last checked alpha-1 antitrypsin levels are normal.  We will continue to monitor.  Left lower lobe pulmonary nodule. Continue annual screening.  Health maintenance Flu vaccine today Patient states that he is up-to-date with his pneumonia vaccine at his primary care.  Plan/Recommendations: - Stop anoro, start trelegy - PFTs - Exercise program - Follow up low dose screening CT  Chilton Greathouse MD [facility] 03/25/2018, 1:35 PM  CC: Merri Brunette, MD

## 2018-03-28 NOTE — Telephone Encounter (Signed)
This has been documented in patient's chart. Delray Alt is aware that the PCPs office returned her call. Will close this message.

## 2018-03-28 NOTE — Telephone Encounter (Signed)
Candi, PCP office, states pt's last PNA shot was 10/07/2014.

## 2018-03-28 NOTE — Telephone Encounter (Signed)
LM in medical records to return call for PNA dates.

## 2018-04-22 DIAGNOSIS — H349 Unspecified retinal vascular occlusion: Secondary | ICD-10-CM | POA: Diagnosis not present

## 2018-04-22 DIAGNOSIS — H02105 Unspecified ectropion of left lower eyelid: Secondary | ICD-10-CM | POA: Diagnosis not present

## 2018-04-22 DIAGNOSIS — H353133 Nonexudative age-related macular degeneration, bilateral, advanced atrophic without subfoveal involvement: Secondary | ICD-10-CM | POA: Diagnosis not present

## 2018-04-22 DIAGNOSIS — H40053 Ocular hypertension, bilateral: Secondary | ICD-10-CM | POA: Diagnosis not present

## 2018-05-07 DIAGNOSIS — I6523 Occlusion and stenosis of bilateral carotid arteries: Secondary | ICD-10-CM | POA: Diagnosis not present

## 2018-05-07 DIAGNOSIS — E739 Lactose intolerance, unspecified: Secondary | ICD-10-CM | POA: Diagnosis not present

## 2018-05-07 DIAGNOSIS — M5442 Lumbago with sciatica, left side: Secondary | ICD-10-CM | POA: Diagnosis not present

## 2018-05-07 DIAGNOSIS — F339 Major depressive disorder, recurrent, unspecified: Secondary | ICD-10-CM | POA: Diagnosis not present

## 2018-06-24 DIAGNOSIS — R260 Ataxic gait: Secondary | ICD-10-CM | POA: Diagnosis not present

## 2018-06-24 DIAGNOSIS — M545 Low back pain: Secondary | ICD-10-CM | POA: Diagnosis not present

## 2018-06-26 ENCOUNTER — Ambulatory Visit (INDEPENDENT_AMBULATORY_CARE_PROVIDER_SITE_OTHER)
Admission: RE | Admit: 2018-06-26 | Discharge: 2018-06-26 | Disposition: A | Discharge status: Home / Self Care | Payer: Medicare Other | Source: Ambulatory Visit | Attending: Pulmonary Disease | Admitting: Pulmonary Disease

## 2018-06-26 ENCOUNTER — Ambulatory Visit (INDEPENDENT_AMBULATORY_CARE_PROVIDER_SITE_OTHER): Payer: Medicare Other | Admitting: Pulmonary Disease

## 2018-06-26 ENCOUNTER — Other Ambulatory Visit: Payer: Medicare Other

## 2018-06-26 ENCOUNTER — Ambulatory Visit: Payer: Medicare Other | Admitting: Pulmonary Disease

## 2018-06-26 ENCOUNTER — Encounter: Payer: Self-pay | Admitting: Pulmonary Disease

## 2018-06-26 VITALS — BP 122/58 | HR 80 | Ht 72.0 in | Wt 175.0 lb

## 2018-06-26 DIAGNOSIS — J9811 Atelectasis: Secondary | ICD-10-CM | POA: Diagnosis not present

## 2018-06-26 DIAGNOSIS — J449 Chronic obstructive pulmonary disease, unspecified: Secondary | ICD-10-CM

## 2018-06-26 DIAGNOSIS — Z148 Genetic carrier of other disease: Secondary | ICD-10-CM | POA: Diagnosis not present

## 2018-06-26 LAB — CBC WITH DIFFERENTIAL/PLATELET
Basophils Absolute: 0 10*3/uL (ref 0.0–0.1)
Basophils Relative: 0.5 % (ref 0.0–3.0)
Eosinophils Absolute: 0.1 10*3/uL (ref 0.0–0.7)
Eosinophils Relative: 2 % (ref 0.0–5.0)
HCT: 42.3 % (ref 39.0–52.0)
Hemoglobin: 14.5 g/dL (ref 13.0–17.0)
Lymphocytes Relative: 27.9 % (ref 12.0–46.0)
Lymphs Abs: 2.1 10*3/uL (ref 0.7–4.0)
MCHC: 34.3 g/dL (ref 30.0–36.0)
MCV: 92.3 fl (ref 78.0–100.0)
Monocytes Absolute: 0.6 10*3/uL (ref 0.1–1.0)
Monocytes Relative: 7.4 % (ref 3.0–12.0)
Neutro Abs: 4.6 10*3/uL (ref 1.4–7.7)
Neutrophils Relative %: 62.2 % (ref 43.0–77.0)
Platelets: 138 10*3/uL — ABNORMAL LOW (ref 150.0–400.0)
RBC: 4.58 Mil/uL (ref 4.22–5.81)
RDW: 13.9 % (ref 11.5–15.5)
WBC: 7.4 10*3/uL (ref 4.0–10.5)

## 2018-06-26 LAB — PULMONARY FUNCTION TEST
DL/VA % pred: 64 %
DL/VA: 3.05 ml/min/mmHg/L
DLCO unc % pred: 46 %
DLCO unc: 16.26 ml/min/mmHg
FEF 25-75 Post: 1.38 L/sec
FEF 25-75 Pre: 0.97 L/sec
FEF2575-%Change-Post: 42 %
FEF2575-%Pred-Post: 60 %
FEF2575-%Pred-Pre: 42 %
FEV1-%Change-Post: 8 %
FEV1-%Pred-Post: 63 %
FEV1-%Pred-Pre: 58 %
FEV1-Post: 2.03 L
FEV1-Pre: 1.87 L
FEV1FVC-%Change-Post: 2 %
FEV1FVC-%Pred-Pre: 93 %
FEV6-%Change-Post: 5 %
FEV6-%Pred-Post: 68 %
FEV6-%Pred-Pre: 64 %
FEV6-Post: 2.85 L
FEV6-Pre: 2.71 L
FEV6FVC-%Change-Post: 0 %
FEV6FVC-%Pred-Post: 103 %
FEV6FVC-%Pred-Pre: 104 %
FVC-%Change-Post: 5 %
FVC-%Pred-Post: 65 %
FVC-%Pred-Pre: 62 %
FVC-Post: 2.93 L
FVC-Pre: 2.77 L
Post FEV1/FVC ratio: 69 %
Post FEV6/FVC ratio: 97 %
Pre FEV1/FVC ratio: 68 %
Pre FEV6/FVC Ratio: 98 %
RV % pred: 104 %
RV: 2.85 L
TLC % pred: 74 %
TLC: 5.54 L

## 2018-06-26 NOTE — Patient Instructions (Signed)
Continue the Trelegy inhaler Continue to work on exercise program We will check your oxygen levels on exertion today We will check some tests today including CBC with differential, IgE and a chest x-ray Follow-up in 3 months

## 2018-06-26 NOTE — Progress Notes (Signed)
PFT done today. 

## 2018-06-26 NOTE — Addendum Note (Signed)
Addended by: Demetrio Lapping E on: 06/26/2018 02:56 PM   Modules accepted: Orders

## 2018-06-26 NOTE — Progress Notes (Signed)
Timothy Spencer    [id number]    12/28/1940  Primary Care Physician:Pharr, Zollie Beckers, MD  Referring Physician: Merri Brunette, MD [address] [place], [place] [address]  Chief complaint: Follow-up for dyspnea, moderate COPD  HPI: 78 year old with moderate COPD, hypertension, hypothyroidism, restless leg syndrome, coronary artery disease.  Seen at [facility] pulmonary for evaluation of dyspnea in 2017.  He had a cardiopulmonary exercise test done there which showed deconditioning.  Also has history of right bundle branch block, coronary artery disease and follows with cardiology on a regular basis. He has enrolled in an exercise program and works out 4 times a week.  Reports marked improvement in symptoms of dyspnea.  Interim history: He continues to be active going to the gym 3-4 times a day to do aerobic and strength training. Anoro changed to Trelegy at last visit due to increased dyspnea on exertion.Marland Kitchen  He did not make the switch immediatly since he wanted to finish the Anoro inhalers he had on hand He has been on Trelegy for a few weeks.  Has not noticed a difference yet.  Outpatient Encounter Medications as of 06/26/2018  Medication Sig  . aspirin EC 81 MG tablet Take 81 mg by mouth daily.  . cholecalciferol (VITAMIN D) 1000 units tablet Take 1,000 Units by mouth daily.  . isosorbide mononitrate (IMDUR) 60 MG 24 hr tablet TAKE 1 AND 1/2 TABLETS (90 MG) BY MOUTH DAILY.  Marland Kitchen levothyroxine (SYNTHROID, LEVOTHROID) 175 MCG tablet Take 1 tablet by mouth daily.  . ranitidine (ZANTAC) 150 MG tablet Take 150 mg by mouth 2 (two) times daily.  . ranolazine (RANEXA) 500 MG 12 hr tablet Take 500 mg by mouth daily.   Marland Kitchen rOPINIRole (REQUIP) 1 MG tablet Take 1 mg by mouth 2 (two) times daily.   . sertraline (ZOLOFT) 100 MG tablet Take 100 mg by mouth daily.   . tamsulosin (FLOMAX) 0.4 MG CAPS capsule Take 0.4 mg by mouth daily.   . traMADol (ULTRAM) 50 MG tablet 1 TABLET BY  MOUTH DAILY  . pravastatin (PRAVACHOL) 20 MG tablet Take 1 tablet (20 mg total) by mouth every evening.   No facility-administered encounter medications on file as of 06/26/2018.    Physical Exam: Blood pressure (!) 122/58, pulse 80, height 6' (1.829 m), weight 175 lb (79.4 kg), SpO2 98 %. Gen:      No acute distress HEENT:  EOMI, sclera anicteric Neck:     No masses; no thyromegaly Lungs:    Clear to auscultation bilaterally; normal respiratory effort CV:         Regular rate and rhythm; no murmurs Abd:      + bowel sounds; soft, non-tender; no palpable masses, no distension Ext:    No edema; adequate peripheral perfusion Skin:      Warm and dry; no rash Neuro: alert and oriented x 3 Psych: normal mood and affect  Data Reviewed: PFTs  03/13/17- FVC 2.75 [61%], FEV1 1.92 (59%], F/F 70, TLC 80%, RV/TLC 143%, DLCO 47% 09/17/15- FVC 3.97%], FEV1 2.78 [83%], F/F 70, TLC 89%, DLCO 52%  04/11/17:  Walked 312 Meters / Baseline Sat 100% on RA / Nadir Sat 98% on RA  Imaging CT chest 10/11/16- 4 mm left lower lobe nodule.  Previously noted lower lobe opacities have resolved.  Centrilobular emphysema.  Pleural thickening versus small effusion on the left CT chest 07/13/16-6 mm left lower lobe nodule.  9 mm left  lower lobe nodule.  4 mm left lower lobe nodule.  Questionable pleural thickening versus pleural effusion I have reviewed the images personally.  ESOPHAGRAM/BARIUM SWALLOW 09/10/15:  Small hiatal hernia with mild stricture at the gastroesophageal junction. Negative for reflux.  CARDIAC TTE (12/15/15):  LV normal in size with EF 60-65%. Impaired diastolic function of LV. LA & RA normal in size. RV normal in size & function. No aortic stenosis or regurgitation. Trace mitral regurgitation. Trace tricuspid regurgitation. Normal aortic root. No pericardial effusion. Trace pulmonic regurgitation.   LABS 03/06/17 Alpha-1 antitrypsin: MS (159)  01/29/09 ANA:  Negative RA:  79  Cardio  pulmonary Exercise stress test 2017 from [facility] This cardiopulmonary exercise test demonstrated a decreased exercise capacity. The patient did not demonstrate a pulmonary limitation. Given the patient's anaerobic threshold > 40% with increased heart rate reserve, poor effort or deconditioning can account for the patient's symptoms. However, the patient did display a reduced oxygen pulse which is an indirect measure of stroke volume which may indicate early cardiovascular disease. Please note that the patient's baseline EKG displayed a RBBB limiting an evaluation of ST segment alterations during the procedure.  Assessment:  Moderate COPD Continue on Trelegy inhaler. Encouraged to continue exercise program at the gym Did not desat on exertion today. Chest x-ray today.   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  Last checked alpha-1 antitrypsin levels are normal.  We will continue to monitor. Recheck alpha-1 antitrypsin levels, CBC with differential.  Left lower lobe pulmonary nodule. Continue annual screening.  Health maintenance 03/25/2018-influenza Patient states that he is up-to-date with his pneumonia vaccine at his primary care.  Plan/Recommendations: - Continue Trelegy - Exercise program - Follow up low dose screening CT  Chilton Greathouse MD [facility] 06/26/2018, 2:17 PM  CC: Merri Brunette, MD

## 2018-06-27 LAB — IGE: IgE (Immunoglobulin E), Serum: 149 kU/L — ABNORMAL HIGH (ref <=114)

## 2018-06-27 LAB — ALPHA-1-ANTITRYPSIN: A-1 Antitrypsin, Ser: 156 mg/dL (ref 83–199)

## 2018-07-02 DIAGNOSIS — R260 Ataxic gait: Secondary | ICD-10-CM | POA: Diagnosis not present

## 2018-07-02 DIAGNOSIS — M545 Low back pain: Secondary | ICD-10-CM | POA: Diagnosis not present

## 2018-07-24 ENCOUNTER — Encounter: Payer: Self-pay | Admitting: Cardiovascular Disease

## 2018-07-24 ENCOUNTER — Ambulatory Visit: Payer: Medicare Other | Admitting: Cardiovascular Disease

## 2018-07-24 VITALS — BP 112/60 | HR 59 | Ht 72.0 in | Wt 173.0 lb

## 2018-07-24 DIAGNOSIS — I10 Essential (primary) hypertension: Secondary | ICD-10-CM | POA: Diagnosis not present

## 2018-07-24 DIAGNOSIS — M545 Low back pain: Secondary | ICD-10-CM | POA: Diagnosis not present

## 2018-07-24 DIAGNOSIS — J439 Emphysema, unspecified: Secondary | ICD-10-CM

## 2018-07-24 DIAGNOSIS — I4510 Unspecified right bundle-branch block: Secondary | ICD-10-CM

## 2018-07-24 DIAGNOSIS — I440 Atrioventricular block, first degree: Secondary | ICD-10-CM

## 2018-07-24 DIAGNOSIS — R260 Ataxic gait: Secondary | ICD-10-CM | POA: Diagnosis not present

## 2018-07-24 DIAGNOSIS — E785 Hyperlipidemia, unspecified: Secondary | ICD-10-CM

## 2018-07-24 DIAGNOSIS — I2510 Atherosclerotic heart disease of native coronary artery without angina pectoris: Secondary | ICD-10-CM

## 2018-07-24 NOTE — Progress Notes (Signed)
Patient ID: Timothy Spencer, male   DOB: Jul 31, 1940, 78 y.o.   MRN: [id number]      HPI: Timothy Spencer is a 78 y.o. male who presents for a 14 month follow-up cardiology evaluation.    Timothy Spencer has a previous long-standing history of tobacco abuse  but quit smoking in May 2012 after smoking for over 40 years. He had undergone cardiac catheterization initially in 2009 and subsequently in January 2012 which showed mild CAD with mid systolic bridging of his LAD which narrowed up to 95% during systole and was essentially normal during diastole, 50-60% circumflex stenosis, 30-40% mid AV groove stenosis and he had a nondominant right coronary artery. He  benefited with the addition of Ranexa to his medical regimen. A cardiopulmonary met test subsequently suggested an ischemic response. He has been exercising regularly. He no longer smokes cigarettes. He has chronic right bundle branch block.   When I saw him in May 2015  he had been taking off his metoprolol due to continued fatigability. He does have a history of hypothyroidism, and he has been on Synthroid 150 mcg.    Laboratory done by Dr. Shelia Spencer in April 2015 showed a hemoglobin of 14.2, hematocrit 42.0.  BUN 2621.5.  Lipid studies were excellent with a total cholesterol 119 triglycerides were slightly elevated at 179, HDL 44, calculated LDL 39.  Urinalysis was negative.   When I last saw him in 2016 he had noticed a significant change in his ability to exercise.  He has noticed a dramatic increase in exertional shortness of breath.  He underwent palmar a function studies by Dr. Shelia Spencer which revealed airway obstruction and a diffusion defect suggesting emphysema but the absence of overinflation was inconsistent with that diagnosis.  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  Because of his significant symptom change he underwent definitive  catheterization on 11/16/2014 which showed two-vessel disease with 50% mid circumflex stenosis, and there was a 90% mid distal LAD lesion secondary to dynamic systolic bridging which normalized during diastole.  Increased medical therapy was recommended.  He has been evaluated at [facility] in a pulmonary function studies.  He was seen in the office by Timothy Deforest, PA January 2018 with low blood pressure.    He was seen by Timothy Spencer in September 2018 after he was concerned that Crestor was causing increasing dyspnea on exertion.  His dyspnea improved after he stopped Crestor.  During that evaluation, he initiated pravastatin at 20 mg, which the patient has tolerated and denies any recurrent increasing shortness of breath.  He has emphysema.   Iast saw him in December 2018 at which time he denied any chest tightness or palpitations.  He now goes to the gym at least 4 days/week.  He denies chest pain PND orthopnea.  At times he has noticed some slight low blood pressure and mild dizziness if he stands abruptly.  He has continued to take isosorbide 90 mg, rosuvastatin 5 mg every other day levothyroxine 175 mcg.  He also is on an oral inhaler.  He is no longer on pravastatin.  He is no longer on Ranexa.  Past Medical History:  Diagnosis Date  . Anxiety and depression   . BPH (benign prostatic hyperplasia)   . CAD (coronary artery disease)   . COPD (chronic obstructive pulmonary disease) ([place])   . DOE (dyspnea on exertion)   .  ED (erectile dysfunction)   . Emphysema of lung ([place])   . First degree AV block   . GERD (gastroesophageal reflux disease)   . Glaucoma   . Gout   . Gynecomastia   . H/O ETOH abuse   . Hemochromatosis   . Hyperlipidemia   . Hypertension   . Hypothyroidism   . Macular degeneration   . OSA (obstructive sleep apnea)   . Osteopenia   . Peripheral neuropathy   . RLS (restless legs syndrome)   . Thrombocytopenia ([place])     Past Surgical History:  Procedure Laterality Date   . CARDIAC CATHETERIZATION  03/27/2008   Medical therapy  . CARDIAC CATHETERIZATION  06/03/2010   Medical theray and smoking cessation  . CARDIAC CATHETERIZATION N/A 11/24/2014   Procedure: Left Heart Cath and Coronary Angiography;  Surgeon: Timothy Sine, MD;  Location: [facility];  Service: Cardiovascular;  Laterality: N/A;  . CARDIOPULMONARY EXERCISE TEST  02/12/2012   Peak VO2 63% of predicted  . ORIF FEMUR FRACTURE Right   . TRANSTHORACIC ECHOCARDIOGRAM  11/28/2011   EF >55%, normal    No Known Allergies  Current Outpatient Medications  Medication Sig Dispense Refill  . aspirin EC 81 MG tablet Take 81 mg by mouth daily.    . cholecalciferol (VITAMIN D) 1000 units tablet Take 1,000 Units by mouth daily.    . isosorbide mononitrate (IMDUR) 60 MG 24 hr tablet TAKE 1 AND 1/2 TABLETS (90 MG) BY MOUTH DAILY. 135 tablet 2  . levothyroxine (SYNTHROID, LEVOTHROID) 175 MCG tablet Take 1 tablet by mouth daily.  5  . ranitidine (ZANTAC) 150 MG tablet Take 150 mg by mouth 2 (two) times daily.    . ranolazine (RANEXA) 500 MG 12 hr tablet Take 500 mg by mouth daily.     Marland Kitchen rOPINIRole (REQUIP) 1 MG tablet Take 1 mg by mouth 2 (two) times daily.     . rosuvastatin (CRESTOR) 5 MG tablet Take 5 mg by mouth every other day.    . sertraline (ZOLOFT) 100 MG tablet Take 100 mg by mouth daily.     . tamsulosin (FLOMAX) 0.4 MG CAPS capsule Take 0.4 mg by mouth daily.     . traMADol (ULTRAM) 50 MG tablet 1 TABLET BY MOUTH DAILY  0  . pravastatin (PRAVACHOL) 20 MG tablet Take 1 tablet (20 mg total) by mouth every evening. 30 tablet 5   No current facility-administered medications for this visit.     Social History   Socioeconomic History  . Marital status: Married    Spouse name: Not on file  . Number of children: Not on file  . Years of education: Not on file  . Highest education level: Not on file  Occupational History  . Not on file  Social Needs  . Financial resource strain: Not on file    . Food insecurity:    Worry: Not on file    Inability: Not on file  . Transportation needs:    Medical: Not on file    Non-medical: Not on file  Tobacco Use  . Smoking status: Former Smoker    Packs/day: 2.00    Years: 50.00    Pack years: 100.00    Types: Cigarettes    Start date: 04/11/1960    Last attempt to quit: 03/19/2011    Years since quitting: 7.3  . Smokeless tobacco: Never Used  . Tobacco comment: Quit for 1 year total   Substance and Sexual Activity  .  Alcohol use: No    Alcohol/week: 0.0 standard drinks  . Drug use: No  . Sexual activity: Not on file  Lifestyle  . Physical activity:    Days per week: Not on file    Minutes per session: Not on file  . Stress: Not on file  Relationships  . Social connections:    Talks on phone: Not on file    Gets together: Not on file    Attends religious service: Not on file    Active member of club or organization: Not on file    Attends meetings of clubs or organizations: Not on file    Relationship status: Not on file  . Intimate partner violence:    Fear of current or ex partner: Not on file    Emotionally abused: Not on file    Physically abused: Not on file    Forced sexual activity: Not on file  Other Topics Concern  . Not on file  Social History Narrative   [facility] (03/06/17):   Originally from [place], [place]. Has always lived in [place]. Has traveled to [place], [place], [place], [place], [place], & [place]. Has a dog currently. No bird exposure. No mold or hot tub exposure. Previously enjoyed riding horses and playing golf. Currently has no hobbies. 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     Family History  Adopted: Yes   Additional social history is notable in that he is married has 2 children. He does walk. He quit smoking in 2012. His alcohol use.  ROS General: Positive for mild fatigue; No fevers, chills, or night sweats;  HEENT: Negative; No changes  in vision or hearing, sinus congestion, difficulty swallowing Pulmonary:  Positive for  emphysema.  Shortness of breath Cardiovascular:  See history of present illness GI: Positive for constipation and difficult to wipe his stool; No nausea, vomiting, diarrhea, or abdominal pain GU: Negative; No dysuria, hematuria, or difficulty voiding Musculoskeletal: Negative; no myalgias, joint pain, or weakness Hematologic/Oncology: Negative; no easy bruising, bleeding Endocrine: Positive for hypothyroidism , on Synthroid replacement; no heat/cold intolerance; no diabetes Neuro: Negative; no changes in balance, headaches Skin: Negative; No rashes or skin lesions Psychiatric: Negative; No behavioral problems, depression Sleep: Negative; No snoring, daytime sleepiness, hypersomnolence, bruxism, restless legs, hypnogognic hallucinations, no cataplexy Other comprehensive 14 point system review is negative.   PE BP 112/60   Pulse (!) 59   Ht 6' (1.829 m)   Wt 173 lb (78.5 kg)   BMI 23.46 kg/m    Repeat blood pressure by me was 108/64 supine and 104/60 standing.  Wt Readings from Last 3 Encounters:  07/24/18 173 lb (78.5 kg)  06/26/18 175 lb (79.4 kg)  03/25/18 167 lb (75.8 kg)   General: Alert, oriented, no distress.  Skin: normal turgor, no rashes, warm and dry HEENT: Normocephalic, atraumatic. Pupils equal round and reactive to light; sclera anicteric; extraocular muscles intact;  Nose without nasal septal hypertrophy Mouth/Parynx benign; Mallinpatti scale 3 Neck: No JVD, no carotid bruits; normal carotid upstroke Lungs: Rightly decreased breath sounds without rales or wheezing. Chest wall: without tenderness to palpitation Heart: PMI not displaced, RRR, s1 s2 normal, 1/6 systolic murmur, no diastolic murmur, no rubs, gallops, thrills, or heaves Abdomen: soft, nontender; no hepatosplenomehaly, BS+; abdominal aorta nontender and not dilated by palpation. Back: no CVA tenderness Pulses  2+ Musculoskeletal: full range of motion, normal strength, no joint deformities Extremities: no clubbing cyanosis  or edema, Homan's sign negative  Neurologic: grossly nonfocal; Cranial nerves grossly wnl Psychologic: Normal mood and affect   ECG (independently read by me): Sinus bradycardia 59 bpm, first-degree AV block with PR 244 ms.  Right bundle branch block with repolarization changes, left anterior hemiblock.  December 2018 ECG (independently read by me): Normal sinus rhythm at 60 bpm with first-degree AV block.  Right bundle branch block with repolarization changes.  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  PR interval 254 ms.  QTc interval 440 ms.  May 2018 ECG (independently read by me): Normal sinus rhythm with first-degree AV block at 61 bpm.  PR interval 260 ms.  Right bundle-branch block with repolarization changes.  June 2016 ECG (independently read by me):  Sinus rhythm with first-degree AV block.  Right bundle-branch block.  PR interval 266 ms. inferior infarct.  February 2016ECG (independently read by me): Sinus rhythm at 64 bpm. ,  First-degree AV block.  Right bundle-branch block with repolarization changes.  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  The computer was incorrect and reading this as an undetermined rhythm.  May 2015 ECG  normal sinus rhythm at 62 beats per minute.  Right bundle branch block with repolarization changes.  First-degree AV block with PR interval of 252 ms.  Prior October 2014 ECG: Sinus rhythm with right bundle branch block with first-degree AV block.  LABS:  BMP Latest Ref Rng & Units 06/19/2016 08/13/2015 11/20/2014  Glucose 65 - 99 mg/dL 88 93 89  BUN 7 - 25 mg/dL 23 26(H) 20  Creatinine 0.70 - 1.18 mg/dL 1.44(H) 1.43 1.29  Sodium 135 - 146 mmol/L 141 141 143  Potassium 3.5 - 5.3 mmol/L 3.8 4.0 4.4  Chloride 98 - 110 mmol/L 107 106 103  CO2 20 - 31 mmol/L _0 Calcium 8.6 - 10.3 mg/dL 9.4 9.6 9.4   Hepatic Function Latest Ref Rng & Units 11/20/2014  12/06/2009 06/04/2009  Total Protein 6.0 - 8.3 g/dL 6.8 7.4 6.5  Albumin 3.5 - 5.2 g/dL 4.1 4.5 4.2  AST 0 - 37 U/L _1 ALT 0 - 53 U/L _2 Alk Phosphatase 39 - 117 U/L 101 87 96  Total Bilirubin 0.2 - 1.2 mg/dL 1.0 0.9 1.4(H)   CBC Latest Ref Rng & Units 06/26/2018 06/19/2016 08/13/2015  WBC 4.0 - 10.5 K/uL 7.4 7.2 7.2  Hemoglobin 13.0 - 17.0 g/dL 14.5 14.7 14.8  Hematocrit 39.0 - 52.0 % 42.3 43.2 43.3  Platelets 150.0 - 400.0 K/uL 138.0(L) 140 148.0(L)   Lab Results  Component Value Date   MCV 92.3 06/26/2018   MCV 94.1 06/19/2016   MCV 97.0 08/13/2015   Lab Results  Component Value Date   TSH 3.70 08/13/2015   Lipid Panel  No results found for: CHOL, TRIG, HDL, CHOLHDL, VLDL, LDLCALC, LDLDIRECT   RADIOLOGY: No results found.  IMPRESSION:  1. Essential hypertension   2. Coronary artery disease involving native coronary artery of native heart without angina pectoris   3. Hyperlipidemia with target LDL less than 70   4. RBBB   5. 1st degree AV block   6. Pulmonary emphysema, unspecified emphysema type ([place])     ASSESSMENT AND PLAN: Timothy Spencer is a 78 year old white male who has established CAD with documented midsystolic bridging of the LAD.  Catheterization in January 2012  revealed vigorous left ventricle contractility with LVH.  There was evidence for significant mid LAD, mid systolic bridging with the vessel appeared normal during diastole and  narrowing to 95% focally in the intramyocardial segment during systole.  There was slight progression of previously documented circumflex disease with the proximal AV groove circumflex narrowing to 50-60% and 30-40% narrowing in the AV groove circumflex after the second marginal branch.  Due to increasing symptomatology and an equivocal nuclear study repeat catheterization on 11/24/2014 was unchanged and showed again dynamic systolic bridging in the mid LAD intramyocardial segment, which now at 80-90% during systole and was 0%  during diastole.  The mid AV groove circumflex had 50% stenosis in a dominant left circumflex system.  Over the last several years he has continued to do well from an anginal standpoint.  Fortunately, he quit smoking in May 2012.  His blood pressure today is low and on repeat by me was 108/64 supine and 104/60 standing.  He has noticed some mild episodes of dizziness.  I have suggested a reduction of his isosorbide mononitrate dose to 30 mg but if recurrent angina develops further titration may be necessary.  His ECG shows chronic right bundle branch block and he has first-degree AV block which is stable.  He continues to be followed by Dr. Loney Loh who checks his laboratory.  He is now on Cisco inhaler.  He has emphysema which seems to be fairly stable.  Was some confusion if he was either on pravastatin or rosuvastatin but he continues to take a statin.  He is on levothyroxine for hypothyroidism.  His restless legs is treated with rec whip with benefit.  As long as he is stable I will see him in 1 year for follow-up evaluation or sooner if problems arise.   Time spent: 25 minutes  Timothy Sine, MD, [facility]  07/26/2018 6:03 PM

## 2018-07-24 NOTE — Patient Instructions (Signed)

## 2018-07-26 ENCOUNTER — Encounter: Payer: Self-pay | Admitting: Cardiovascular Disease

## 2018-08-03 ENCOUNTER — Other Ambulatory Visit: Payer: Self-pay | Admitting: Cardiovascular Disease

## 2018-10-11 DIAGNOSIS — E7800 Pure hypercholesterolemia, unspecified: Secondary | ICD-10-CM | POA: Diagnosis not present

## 2018-10-11 DIAGNOSIS — E039 Hypothyroidism, unspecified: Secondary | ICD-10-CM | POA: Diagnosis not present

## 2018-10-11 DIAGNOSIS — I10 Essential (primary) hypertension: Secondary | ICD-10-CM | POA: Diagnosis not present

## 2018-10-11 DIAGNOSIS — Z125 Encounter for screening for malignant neoplasm of prostate: Secondary | ICD-10-CM | POA: Diagnosis not present

## 2018-10-16 DIAGNOSIS — K219 Gastro-esophageal reflux disease without esophagitis: Secondary | ICD-10-CM | POA: Diagnosis not present

## 2018-10-16 DIAGNOSIS — J432 Centrilobular emphysema: Secondary | ICD-10-CM | POA: Diagnosis not present

## 2018-10-16 DIAGNOSIS — C4491 Basal cell carcinoma of skin, unspecified: Secondary | ICD-10-CM | POA: Diagnosis not present

## 2018-10-16 DIAGNOSIS — Z0000 Encounter for general adult medical examination without abnormal findings: Secondary | ICD-10-CM | POA: Diagnosis not present

## 2018-10-23 DIAGNOSIS — L570 Actinic keratosis: Secondary | ICD-10-CM | POA: Diagnosis not present

## 2018-10-23 DIAGNOSIS — L0102 Bockhart's impetigo: Secondary | ICD-10-CM | POA: Diagnosis not present

## 2018-10-23 DIAGNOSIS — D485 Neoplasm of uncertain behavior of skin: Secondary | ICD-10-CM | POA: Diagnosis not present

## 2018-10-23 DIAGNOSIS — L720 Epidermal cyst: Secondary | ICD-10-CM | POA: Diagnosis not present

## 2018-10-23 DIAGNOSIS — D225 Melanocytic nevi of trunk: Secondary | ICD-10-CM | POA: Diagnosis not present

## 2018-10-25 DIAGNOSIS — H353231 Exudative age-related macular degeneration, bilateral, with active choroidal neovascularization: Secondary | ICD-10-CM | POA: Diagnosis not present

## 2018-10-25 DIAGNOSIS — H353113 Nonexudative age-related macular degeneration, right eye, advanced atrophic without subfoveal involvement: Secondary | ICD-10-CM | POA: Diagnosis not present

## 2018-10-25 DIAGNOSIS — H349 Unspecified retinal vascular occlusion: Secondary | ICD-10-CM | POA: Diagnosis not present

## 2018-10-28 DIAGNOSIS — H353114 Nonexudative age-related macular degeneration, right eye, advanced atrophic with subfoveal involvement: Secondary | ICD-10-CM | POA: Diagnosis not present

## 2018-10-28 DIAGNOSIS — H43813 Vitreous degeneration, bilateral: Secondary | ICD-10-CM | POA: Diagnosis not present

## 2018-10-28 DIAGNOSIS — H353221 Exudative age-related macular degeneration, left eye, with active choroidal neovascularization: Secondary | ICD-10-CM | POA: Diagnosis not present

## 2018-12-02 DIAGNOSIS — H353221 Exudative age-related macular degeneration, left eye, with active choroidal neovascularization: Secondary | ICD-10-CM | POA: Diagnosis not present

## 2018-12-02 DIAGNOSIS — H35373 Puckering of macula, bilateral: Secondary | ICD-10-CM | POA: Diagnosis not present

## 2018-12-02 DIAGNOSIS — H43813 Vitreous degeneration, bilateral: Secondary | ICD-10-CM | POA: Diagnosis not present

## 2018-12-02 DIAGNOSIS — H353114 Nonexudative age-related macular degeneration, right eye, advanced atrophic with subfoveal involvement: Secondary | ICD-10-CM | POA: Diagnosis not present

## 2018-12-04 ENCOUNTER — Other Ambulatory Visit: Payer: Self-pay | Admitting: Pulmonary Disease

## 2018-12-16 DIAGNOSIS — E039 Hypothyroidism, unspecified: Secondary | ICD-10-CM | POA: Diagnosis not present

## 2019-01-13 DIAGNOSIS — H353221 Exudative age-related macular degeneration, left eye, with active choroidal neovascularization: Secondary | ICD-10-CM | POA: Diagnosis not present

## 2019-02-04 ENCOUNTER — Other Ambulatory Visit: Payer: Self-pay

## 2019-02-04 DIAGNOSIS — Z0120 Encounter for dental examination and cleaning without abnormal findings: Secondary | ICD-10-CM | POA: Diagnosis not present

## 2019-02-04 MED ORDER — TRELEGY ELLIPTA 100-62.5-25 MCG/INH IN AEPB: 1.0000 | INHALATION_SPRAY | Freq: Every day | RESPIRATORY_TRACT | 1 refills | Status: DC

## 2019-02-05 DIAGNOSIS — M353 Polymyalgia rheumatica: Secondary | ICD-10-CM | POA: Diagnosis not present

## 2019-02-14 DIAGNOSIS — Z23 Encounter for immunization: Secondary | ICD-10-CM | POA: Diagnosis not present

## 2019-02-19 DIAGNOSIS — M47816 Spondylosis without myelopathy or radiculopathy, lumbar region: Secondary | ICD-10-CM | POA: Diagnosis not present

## 2019-02-20 DIAGNOSIS — E039 Hypothyroidism, unspecified: Secondary | ICD-10-CM | POA: Diagnosis not present

## 2019-02-24 DIAGNOSIS — H353221 Exudative age-related macular degeneration, left eye, with active choroidal neovascularization: Secondary | ICD-10-CM | POA: Diagnosis not present

## 2019-02-24 DIAGNOSIS — H353114 Nonexudative age-related macular degeneration, right eye, advanced atrophic with subfoveal involvement: Secondary | ICD-10-CM | POA: Diagnosis not present

## 2019-02-24 DIAGNOSIS — H4311 Vitreous hemorrhage, right eye: Secondary | ICD-10-CM | POA: Diagnosis not present

## 2019-02-24 DIAGNOSIS — H43813 Vitreous degeneration, bilateral: Secondary | ICD-10-CM | POA: Diagnosis not present

## 2019-02-25 DIAGNOSIS — M5116 Intervertebral disc disorders with radiculopathy, lumbar region: Secondary | ICD-10-CM | POA: Diagnosis not present

## 2019-02-25 DIAGNOSIS — M5416 Radiculopathy, lumbar region: Secondary | ICD-10-CM | POA: Diagnosis not present

## 2019-03-03 DIAGNOSIS — H353221 Exudative age-related macular degeneration, left eye, with active choroidal neovascularization: Secondary | ICD-10-CM | POA: Diagnosis not present

## 2019-03-03 DIAGNOSIS — H353114 Nonexudative age-related macular degeneration, right eye, advanced atrophic with subfoveal involvement: Secondary | ICD-10-CM | POA: Diagnosis not present

## 2019-03-03 DIAGNOSIS — H43813 Vitreous degeneration, bilateral: Secondary | ICD-10-CM | POA: Diagnosis not present

## 2019-03-03 DIAGNOSIS — H35373 Puckering of macula, bilateral: Secondary | ICD-10-CM | POA: Diagnosis not present

## 2019-04-14 ENCOUNTER — Other Ambulatory Visit: Payer: Self-pay

## 2019-04-14 MED ORDER — TRELEGY ELLIPTA 100-62.5-25 MCG/INH IN AEPB: 1.0000 | INHALATION_SPRAY | Freq: Every day | RESPIRATORY_TRACT | 0 refills | Status: DC

## 2019-04-16 DIAGNOSIS — L570 Actinic keratosis: Secondary | ICD-10-CM | POA: Diagnosis not present

## 2019-04-16 DIAGNOSIS — M47816 Spondylosis without myelopathy or radiculopathy, lumbar region: Secondary | ICD-10-CM | POA: Diagnosis not present

## 2019-04-16 DIAGNOSIS — L218 Other seborrheic dermatitis: Secondary | ICD-10-CM | POA: Diagnosis not present

## 2019-04-22 ENCOUNTER — Telehealth (HOSPITAL_COMMUNITY): Payer: Self-pay

## 2019-04-28 ENCOUNTER — Telehealth (HOSPITAL_COMMUNITY): Payer: Self-pay

## 2019-04-28 DIAGNOSIS — H35373 Puckering of macula, bilateral: Secondary | ICD-10-CM | POA: Diagnosis not present

## 2019-04-28 DIAGNOSIS — H353114 Nonexudative age-related macular degeneration, right eye, advanced atrophic with subfoveal involvement: Secondary | ICD-10-CM | POA: Diagnosis not present

## 2019-04-28 DIAGNOSIS — H43813 Vitreous degeneration, bilateral: Secondary | ICD-10-CM | POA: Diagnosis not present

## 2019-04-28 DIAGNOSIS — H353221 Exudative age-related macular degeneration, left eye, with active choroidal neovascularization: Secondary | ICD-10-CM | POA: Diagnosis not present

## 2019-04-29 ENCOUNTER — Telehealth (HOSPITAL_COMMUNITY): Payer: Self-pay

## 2019-05-01 DIAGNOSIS — M5415 Radiculopathy, thoracolumbar region: Secondary | ICD-10-CM | POA: Diagnosis not present

## 2019-05-01 DIAGNOSIS — M5116 Intervertebral disc disorders with radiculopathy, lumbar region: Secondary | ICD-10-CM | POA: Diagnosis not present

## 2019-05-02 ENCOUNTER — Other Ambulatory Visit: Payer: Self-pay

## 2019-05-02 ENCOUNTER — Encounter: Payer: Self-pay | Admitting: Pulmonary Disease

## 2019-05-02 ENCOUNTER — Ambulatory Visit: Payer: Medicare Other | Admitting: Pulmonary Disease

## 2019-05-02 VITALS — BP 126/80 | HR 88 | Temp 97.8°F | Ht 72.0 in | Wt 168.4 lb

## 2019-05-02 DIAGNOSIS — J449 Chronic obstructive pulmonary disease, unspecified: Secondary | ICD-10-CM | POA: Diagnosis not present

## 2019-05-02 NOTE — Patient Instructions (Signed)
Glad you are doing well with regard to your breathing Continue Trelegy inhaler Continue exercise program  Follow-up in 6 months.

## 2019-05-02 NOTE — Progress Notes (Signed)
Timothy Spencer    [id number]    02/28/1941  Primary Care Physician:Spencer, Timothy Beckers, MD  Referring Physician: Merri Brunette, MD [address] [place],  [place] [address]  Chief complaint: Follow-up for dyspnea, moderate COPD  HPI: 78 year old with moderate COPD, hypertension, hypothyroidism, restless leg syndrome, coronary artery disease.  Seen at [facility] pulmonary for evaluation of dyspnea in 2017.  He had a cardiopulmonary exercise test done there which showed deconditioning.  Also has history of right bundle branch block, coronary artery disease and follows with cardiology on a regular basis. He has enrolled in an exercise program and works out 4 times a week.  Reports marked improvement in symptoms of dyspnea.  Interim history: Used to be active going to the gym 3-4 times a day to do aerobic and strength training.  This is stopped due to Covid pandemic He remains active at home and is using the treadmill  Continues on Trelegy inhaler.  He cannot tell if this is any better than the Anoro which he was on previously Has chronic dyspnea on exertion, cough with clear mucus.  Outpatient Encounter Medications as of 05/02/2019  Medication Sig  . aspirin EC 81 MG tablet Take 81 mg by mouth daily.  . cholecalciferol (VITAMIN D) 1000 units tablet Take 1,000 Units by mouth daily.  . Fluticasone-Umeclidin-Vilant (TRELEGY ELLIPTA) 100-62.5-25 MCG/INH AEPB Inhale 1 puff into the lungs daily.  . isosorbide mononitrate (IMDUR) 60 MG 24 hr tablet TAKE 1 AND 1/2 TABLETS(90 MG) BY MOUTH DAILY  . levothyroxine (SYNTHROID, LEVOTHROID) 175 MCG tablet Take 1 tablet by mouth daily.  . pravastatin (PRAVACHOL) 20 MG tablet Take 1 tablet (20 mg total) by mouth every evening.  . ranitidine (ZANTAC) 150 MG tablet Take 150 mg by mouth 2 (two) times daily.  . ranolazine (RANEXA) 500 MG 12 hr tablet Take 500 mg by mouth daily.   Marland Kitchen rOPINIRole (REQUIP) 1 MG tablet Take 1 mg by mouth 2  (two) times daily.   . sertraline (ZOLOFT) 100 MG tablet Take 100 mg by mouth daily.   . tamsulosin (FLOMAX) 0.4 MG CAPS capsule Take 0.4 mg by mouth daily.   . traMADol (ULTRAM) 50 MG tablet 1 TABLET BY MOUTH DAILY  . [DISCONTINUED] rosuvastatin (CRESTOR) 5 MG tablet Take 5 mg by mouth every other day.   No facility-administered encounter medications on file as of 05/02/2019.    Physical Exam: Blood pressure 126/80, pulse 88, temperature 97.8 F (36.6 C), temperature source Temporal, height 6' (1.829 m), weight 168 lb 6.4 oz (76.4 kg), SpO2 96 %. Gen:      No acute distress HEENT:  EOMI, sclera anicteric Neck:     No masses; no thyromegaly Lungs:    Clear to auscultation bilaterally; normal respiratory effort CV:         Regular rate and rhythm; no murmurs Abd:      + bowel sounds; soft, non-tender; no palpable masses, no distension Ext:    No edema; adequate peripheral perfusion Skin:      Warm and dry; no rash Neuro: alert and oriented x 3 Psych: normal mood and affect Data Reviewed: PFTs  03/13/17- FVC 2.75 [61%], FEV1 1.92 (59%], F/F 70, TLC 80%, RV/TLC 143%, DLCO 47% 09/17/15- FVC 3.97%], FEV1 2.78 [83%], F/F 70, TLC 89%, DLCO 52%  04/11/17:  Walked 312 Meters / Baseline Sat 100% on RA / Nadir Sat 98% on RA  Imaging CT chest 10/11/16-  4 mm left lower lobe nodule.  Previously noted lower lobe opacities have resolved.  Centrilobular emphysema.  Pleural thickening versus small effusion on the left CT chest 07/13/16-6 mm left lower lobe nodule.  9 mm left lower lobe nodule.  4 mm left lower lobe nodule.   Questionable pleural thickening versus pleural effusion CT 10/12/2017- Emphysema, stable pulmonary nodules.  Pleural-parenchymal scarring at the right hemithorax. Chest x-ray 06/26/18-chronic right hemidiaphragm elevation with mild atelectasis. I have reviewed the images personally.  ESOPHAGRAM/BARIUM SWALLOW 09/10/15:  Small hiatal hernia with mild stricture at the  gastroesophageal junction. Negative for reflux.  CARDIAC TTE (12/15/15):  LV normal in size with EF 60-65%. Impaired diastolic function of LV. LA & RA normal in size. RV normal in size & function. No aortic stenosis or regurgitation. Trace mitral regurgitation. Trace tricuspid regurgitation. Normal aortic root. No pericardial effusion. Trace pulmonic regurgitation.   LABS Alpha-1 antitrypsin 03/06/2017-159, MS phenotype Alpha-1 antitrypsin 06/06/2018-156  CBC 06/26/2018-WBC 7.4, eos 2%, absolute eosinophil count 148 IgE 149  01/29/09 ANA:  Negative RA:  78  Cardio pulmonary Exercise stress test 2017 from [facility] This cardiopulmonary exercise test demonstrated a decreased exercise capacity. The patient did not demonstrate a pulmonary limitation. Given the patient's anaerobic threshold > 40% with increased heart rate reserve, poor effort or deconditioning can account for the patient's symptoms. However, the patient did display a reduced oxygen pulse which is an indirect measure of stroke volume which may indicate early cardiovascular disease. Please note that the patient's baseline EKG displayed a RBBB limiting an evaluation of ST segment alterations during the procedure.  Assessment:  Moderate COPD Continue on Trelegy inhaler. 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  Last checked alpha-1 antitrypsin levels are normal.  We will continue to monitor.  Left lower lobe pulmonary nodule. Continue annual screening.  Health maintenance 03/02/2019-influenza Patient states that he is up-to-date with his pneumonia vaccine at his primary care.  Plan/Recommendations: - Continue Trelegy - Exercise program  Timothy Garfinkel MD [facility] 05/02/2019, 2:44 PM  CC: Timothy Pretty, MD

## 2019-05-06 ENCOUNTER — Encounter (HOSPITAL_COMMUNITY)
Admission: RE | Admit: 2019-05-06 | Discharge: 2019-05-06 | Disposition: A | Discharge status: Home / Self Care | Payer: Medicare Other | Source: Ambulatory Visit | Attending: Pulmonary Disease | Admitting: Pulmonary Disease

## 2019-05-06 ENCOUNTER — Other Ambulatory Visit: Payer: Self-pay

## 2019-05-06 VITALS — BP 142/80 | HR 102 | Temp 97.9°F | Ht 72.0 in | Wt 167.1 lb

## 2019-05-06 DIAGNOSIS — J449 Chronic obstructive pulmonary disease, unspecified: Secondary | ICD-10-CM

## 2019-05-06 NOTE — Progress Notes (Signed)
Pulmonary Individual Treatment Plan  Patient Details  Name: CEPHUS TUPY MRN: [id number] Date of Birth: [birth year] Referring Provider:     Pulmonary Rehab Walk Test from 05/06/2019 in [facility] CARDIAC [facility]  Referring Provider  Dr. Renne Crigler      Initial Encounter Date:    Pulmonary Rehab Walk Test from 05/06/2019 in [facility] CARDIAC REHAB  Date  05/06/19      Visit Diagnosis: Chronic obstructive pulmonary disease, unspecified COPD type (HCC)  Patient's Home Medications on Admission:   Current Outpatient Medications:  .  aspirin EC 81 MG tablet, Take 81 mg by mouth daily., Disp: , Rfl:  .  Fluticasone-Umeclidin-Vilant (TRELEGY ELLIPTA) 100-62.5-25 MCG/INH AEPB, Inhale 1 puff into the lungs daily., Disp: 60 each, Rfl: 0 .  isosorbide mononitrate (IMDUR) 60 MG 24 hr tablet, TAKE 1 AND 1/2 TABLETS(90 MG) BY MOUTH DAILY, Disp: 135 tablet, Rfl: 2 .  levothyroxine (SYNTHROID, LEVOTHROID) 175 MCG tablet, Take 1 tablet by mouth daily., Disp: , Rfl: 5 .  ranitidine (ZANTAC) 150 MG tablet, Take 150 mg by mouth 2 (two) times daily., Disp: , Rfl:  .  rOPINIRole (REQUIP) 1 MG tablet, Take 1 mg by mouth 2 (two) times daily. , Disp: , Rfl:  .  sertraline (ZOLOFT) 100 MG tablet, Take 100 mg by mouth daily. , Disp: , Rfl:  .  tamsulosin (FLOMAX) 0.4 MG CAPS capsule, Take 0.4 mg by mouth daily. , Disp: , Rfl:  .  cholecalciferol (VITAMIN D) 1000 units tablet, Take 1,000 Units by mouth daily., Disp: , Rfl:  .  pravastatin (PRAVACHOL) 20 MG tablet, Take 1 tablet (20 mg total) by mouth every evening., Disp: 30 tablet, Rfl: 5 .  ranolazine (RANEXA) 500 MG 12 hr tablet, Take 500 mg by mouth daily. , Disp: , Rfl:  .  traMADol (ULTRAM) 50 MG tablet, 1 TABLET BY MOUTH DAILY, Disp: , Rfl: 0  Past Medical History: Past Medical History:  Diagnosis Date  . Anxiety and depression   . BPH (benign prostatic hyperplasia)   . CAD (coronary artery disease)   . COPD (chronic  obstructive pulmonary disease) (HCC)   . DOE (dyspnea on exertion)   . ED (erectile dysfunction)   . Emphysema of lung (HCC)   . First degree AV block   . GERD (gastroesophageal reflux disease)   . Glaucoma   . Gout   . Gynecomastia   . H/O ETOH abuse   . Hemochromatosis   . Hyperlipidemia   . Hypertension   . Hypothyroidism   . Macular degeneration   . OSA (obstructive sleep apnea)   . Osteopenia   . Peripheral neuropathy   . RLS (restless legs syndrome)   . Thrombocytopenia (HCC)     Tobacco Use: Social History   Tobacco Use  Smoking Status Former Smoker  . Packs/day: 2.00  . Years: 50.00  . Pack years: 100.00  . Types: Cigarettes  . Start date: 04/11/1960  . Quit date: 03/19/2011  . Years since quitting: 8.1  Smokeless Tobacco Never Used  Tobacco Comment   Quit for 1 year total     Labs: Recent Review Flowsheet Data    Labs for ITP Cardiac and Pulmonary Rehab Latest Ref Rng & Units 06/03/2010   TCO2 0 - 100 mmol/L 22      Capillary Blood Glucose: No results found for: GLUCAP   Pulmonary Assessment Scores: Pulmonary Assessment Scores    Row Name 05/06/19 1541  ADL UCSD   ADL Phase  Entry       mMRC Score   mMRC Score  2       UCSD: Self-administered rating of dyspnea associated with activities of daily living (ADLs) 6-point scale (0 = "not at all" to 5 = "maximal or unable to do because of breathlessness")  Scoring Scores range from 0 to 120.  Minimally important difference is 5 units  CAT: CAT can identify the health impairment of COPD patients and is better correlated with disease progression.  CAT has a scoring range of zero to 40. The CAT score is classified into four groups of low (less than 10), medium (10 - 20), high (21-30) and very high (31-40) based on the impact level of disease on health status. A CAT score over 10 suggests significant symptoms.  A worsening CAT score could be explained by an exacerbation, poor medication  adherence, poor inhaler technique, or progression of COPD or comorbid conditions.  CAT MCID is 2 points  mMRC: mMRC (Modified Medical Research Council) Dyspnea Scale is used to assess the degree of baseline functional disability in patients of respiratory disease due to dyspnea. No minimal important difference is established. A decrease in score of 1 point or greater is considered a positive change.   Pulmonary Function Assessment: Pulmonary Function Assessment - 05/06/19 1532      Breath   Bilateral Breath Sounds  Clear    Shortness of Breath  Yes;Limiting activity       Exercise Target Goals: Exercise Program Goal: Individual exercise prescription set using results from initial 6 min walk test and THRR while considering  patient's activity barriers and safety.   Exercise Prescription Goal: Initial exercise prescription builds to 30-45 minutes a day of aerobic activity, 2-3 days per week.  Home exercise guidelines will be given to patient during program as part of exercise prescription that the participant will acknowledge.  Activity Barriers & Risk Stratification: Activity Barriers & Cardiac Risk Stratification - 05/06/19 1459      Activity Barriers & Cardiac Risk Stratification   Activity Barriers  Back Problems;Deconditioning;Muscular Weakness;Shortness of Breath       6 Minute Walk: 6 Minute Walk    Row Name 05/06/19 1546         6 Minute Walk   Phase  Initial     Distance  964 feet     Walk Time  6 minutes     # of Rest Breaks  0     MPH  1.83     METS  2.88     RPE  12     Perceived Dyspnea   1     VO2 Peak  10.88     Symptoms  Yes (comment)     Comments  used wheelchair     Resting HR  95 bpm     Resting BP  110/54     Resting Oxygen Saturation   96 %     Exercise Oxygen Saturation  during 6 min walk  96 %     Max Ex. HR  137 bpm     Max Ex. BP  160/64     2 Minute Post BP  120/64       Interval HR   1 Minute HR  112     2 Minute HR  133     3  Minute HR  130     4 Minute HR  137  5 Minute HR  132     6 Minute HR  130     2 Minute Post HR  106     Interval Heart Rate?  Yes       Interval Oxygen   Interval Oxygen?  Yes     Baseline Oxygen Saturation %  96 %     1 Minute Oxygen Saturation %  96 %     1 Minute Liters of Oxygen  0 L     2 Minute Oxygen Saturation %  97 %     2 Minute Liters of Oxygen  0 L     3 Minute Oxygen Saturation %  96 %     3 Minute Liters of Oxygen  0 L     4 Minute Oxygen Saturation %  97 %     4 Minute Liters of Oxygen  0 L     5 Minute Oxygen Saturation %  97 %     5 Minute Liters of Oxygen  0 L     6 Minute Oxygen Saturation %  96 %     6 Minute Liters of Oxygen  0 L     2 Minute Post Oxygen Saturation %  97 %     2 Minute Post Liters of Oxygen  0 L        Oxygen Initial Assessment: Oxygen Initial Assessment - 05/06/19 1541      Home Oxygen   Home Oxygen Device  None    Sleep Oxygen Prescription  None    Home Exercise Oxygen Prescription  None    Home at Rest Exercise Oxygen Prescription  None    Compliance with Home Oxygen Use  Yes      Initial 6 min Walk   Oxygen Used  None      Program Oxygen Prescription   Program Oxygen Prescription  None      Intervention   Short Term Goals  To learn and exhibit compliance with exercise, home and travel O2 prescription;To learn and understand importance of monitoring SPO2 with pulse oximeter and demonstrate accurate use of the pulse oximeter.;To learn and understand importance of maintaining oxygen saturations>88%;To learn and demonstrate proper pursed lip breathing techniques or other breathing techniques.;To learn and demonstrate proper use of respiratory medications    Long  Term Goals  Exhibits compliance with exercise, home and travel O2 prescription;Verbalizes importance of monitoring SPO2 with pulse oximeter and return demonstration;Maintenance of O2 saturations>88%;Exhibits proper breathing techniques, such as pursed lip breathing or  other method taught during program session       Oxygen Re-Evaluation:   Oxygen Discharge (Final Oxygen Re-Evaluation):   Initial Exercise Prescription: Initial Exercise Prescription - 05/06/19 1500      Date of Initial Exercise RX and Referring Provider   Date  05/06/19    Referring Provider  Dr. Renne CriglerPharr      Recumbant Bike   Level  1    Minutes  15      Arm Ergometer   Level  1    Minutes  15      Prescription Details   Frequency (times per week)  2    Duration  Progress to 30 minutes of continuous aerobic without signs/symptoms of physical distress      Intensity   THRR 40-80% of Max Heartrate  56-113    Ratings of Perceived Exertion  11-13    Perceived Dyspnea  0-4      Progression  Progression  Continue to progress workloads to maintain intensity without signs/symptoms of physical distress.      Resistance Training   Training Prescription  Yes    Weight  orange bands    Reps  10-15       Perform Capillary Blood Glucose checks as needed.  Exercise Prescription Changes:   Exercise Comments:   Exercise Goals and Review: Exercise Goals    Row Name 05/06/19 1603             Exercise Goals   Increase Physical Activity  Yes       Intervention  Provide advice, education, support and counseling about physical activity/exercise needs.;Develop an individualized exercise prescription for aerobic and resistive training based on initial evaluation findings, risk stratification, comorbidities and participant's personal goals.       Expected Outcomes  Short Term: Attend rehab on a regular basis to increase amount of physical activity.;Long Term: Add in home exercise to make exercise part of routine and to increase amount of physical activity.;Long Term: Exercising regularly at least 3-5 days a week.       Increase Strength and Stamina  Yes       Intervention  Provide advice, education, support and counseling about physical activity/exercise needs.;Develop an  individualized exercise prescription for aerobic and resistive training based on initial evaluation findings, risk stratification, comorbidities and participant's personal goals.       Expected Outcomes  Short Term: Increase workloads from initial exercise prescription for resistance, speed, and METs.;Short Term: Perform resistance training exercises routinely during rehab and add in resistance training at home;Long Term: Improve cardiorespiratory fitness, muscular endurance and strength as measured by increased METs and functional capacity (6MWT)       Able to understand and use rate of perceived exertion (RPE) scale  Yes       Intervention  Provide education and explanation on how to use RPE scale       Expected Outcomes  Long Term:  Able to use RPE to guide intensity level when exercising independently;Short Term: Able to use RPE daily in rehab to express subjective intensity level       Able to understand and use Dyspnea scale  Yes       Intervention  Provide education and explanation on how to use Dyspnea scale       Expected Outcomes  Long Term: Able to use Dyspnea scale to guide intensity level when exercising independently;Short Term: Able to use Dyspnea scale daily in rehab to express subjective sense of shortness of breath during exertion       Knowledge and understanding of Target Heart Rate Range (THRR)  Yes       Expected Outcomes  Short Term: Able to state/look up THRR;Short Term: Able to use daily as guideline for intensity in rehab;Long Term: Able to use THRR to govern intensity when exercising independently       Understanding of Exercise Prescription  Yes       Intervention  Provide education, explanation, and written materials on patient's individual exercise prescription       Expected Outcomes  Short Term: Able to explain program exercise prescription;Long Term: Able to explain home exercise prescription to exercise independently          Exercise Goals Re-Evaluation  :   Discharge Exercise Prescription (Final Exercise Prescription Changes):   Nutrition:  Target Goals: Understanding of nutrition guidelines, daily intake of sodium 1500mg , cholesterol 200mg , calories 30% from fat and 7% or  less from saturated fats, daily to have 5 or more servings of fruits and vegetables.  Biometrics: Pre Biometrics - 05/06/19 1500      Pre Biometrics   Grip Strength  24 kg        Nutrition Therapy Plan and Nutrition Goals:   Nutrition Assessments:   Nutrition Goals Re-Evaluation:   Nutrition Goals Discharge (Final Nutrition Goals Re-Evaluation):   Psychosocial: Target Goals: Acknowledge presence or absence of significant depression and/or stress, maximize coping skills, provide positive support system. Participant is able to verbalize types and ability to use techniques and skills needed for reducing stress and depression.  Initial Review & Psychosocial Screening: Initial Psych Review & Screening - 05/06/19 1532      Initial Review   Current issues with  History of Depression      Family Dynamics   Good Support System?  Yes      Barriers   Psychosocial barriers to participate in program  There are no identifiable barriers or psychosocial needs.      Screening Interventions   Interventions  Encouraged to exercise       Quality of Life Scores:  Scores of 19 and below usually indicate a poorer quality of life in these areas.  A difference of  2-3 points is a clinically meaningful difference.  A difference of 2-3 points in the total score of the Quality of Life Index has been associated with significant improvement in overall quality of life, self-image, physical symptoms, and general health in studies assessing change in quality of life.  PHQ-9: Recent Review Flowsheet Data    Depression screen [facility] 2/9 05/06/2019   Decreased Interest 0   Down, Depressed, Hopeless 0   PHQ - 2 Score 0   Altered sleeping 0   Tired, decreased energy 3    Change in appetite 0   Feeling bad or failure about yourself  0   Trouble concentrating 0   Moving slowly or fidgety/restless 0   PHQ-9 Score 3   Difficult doing work/chores Not difficult at all     Interpretation of Total Score  Total Score Depression Severity:  1-4 = Minimal depression, 5-9 = Mild depression, 10-14 = Moderate depression, 15-19 = Moderately severe depression, 20-27 = Severe depression   Psychosocial Evaluation and Intervention: Psychosocial Evaluation - 05/06/19 1532      Psychosocial Evaluation & Interventions   Interventions  Encouraged to exercise with the program and follow exercise prescription    Continue Psychosocial Services   No Follow up required       Psychosocial Re-Evaluation: Psychosocial Re-Evaluation    Row Name 05/06/19 1533             Psychosocial Re-Evaluation   Current issues with  History of Depression       Interventions  Encouraged to attend Pulmonary Rehabilitation for the exercise          Psychosocial Discharge (Final Psychosocial Re-Evaluation): Psychosocial Re-Evaluation - 05/06/19 1533      Psychosocial Re-Evaluation   Current issues with  History of Depression    Interventions  Encouraged to attend Pulmonary Rehabilitation for the exercise       Education: Education Goals: Education classes will be provided on a weekly basis, covering required topics. Participant will state understanding/return demonstration of topics presented.  Learning Barriers/Preferences:   Education Topics: Risk Factor Reduction:  -Group instruction that is supported by a PowerPoint presentation. Instructor discusses the definition of a risk factor, different risk factors for pulmonary  disease, and how the heart and lungs work together.     Nutrition for Pulmonary Patient:  -Group instruction provided by PowerPoint slides, verbal discussion, and written materials to support subject matter. The instructor gives an explanation and review of  healthy diet recommendations, which includes a discussion on weight management, recommendations for fruit and vegetable consumption, as well as protein, fluid, caffeine, fiber, sodium, sugar, and alcohol. Tips for eating when patients are short of breath are discussed.   Pursed Lip Breathing:  -Group instruction that is supported by demonstration and informational handouts. Instructor discusses the benefits of pursed lip and diaphragmatic breathing and detailed demonstration on how to preform both.     Oxygen Safety:  -Group instruction provided by PowerPoint, verbal discussion, and written material to support subject matter. There is an overview of "What is Oxygen" and "Why do we need it".  Instructor also reviews how to create a safe environment for oxygen use, the importance of using oxygen as prescribed, and the risks of noncompliance. There is a brief discussion on traveling with oxygen and resources the patient may utilize.   Oxygen Equipment:  -Group instruction provided by [facility] utilizing handouts, written materials, and equipment demonstrations.   Signs and Symptoms:  -Group instruction provided by written material and verbal discussion to support subject matter. Warning signs and symptoms of infection, stroke, and heart attack are reviewed and when to call the physician/911 reinforced. Tips for preventing the spread of infection discussed.   Advanced Directives:  -Group instruction provided by verbal instruction and written material to support subject matter. Instructor reviews Advanced Directive laws and proper instruction for filling out document.   Pulmonary Video:  -Group video education that reviews the importance of medication and oxygen compliance, exercise, good nutrition, pulmonary hygiene, and pursed lip and diaphragmatic breathing for the pulmonary patient.   Exercise for the Pulmonary Patient:  -Group instruction that is supported by a PowerPoint  presentation. Instructor discusses benefits of exercise, core components of exercise, frequency, duration, and intensity of an exercise routine, importance of utilizing pulse oximetry during exercise, safety while exercising, and options of places to exercise outside of rehab.     Pulmonary Medications:  -Verbally interactive group education provided by instructor with focus on inhaled medications and proper administration.   Anatomy and Physiology of the Respiratory System and Intimacy:  -Group instruction provided by PowerPoint, verbal discussion, and written material to support subject matter. Instructor reviews respiratory cycle and anatomical components of the respiratory system and their functions. Instructor also reviews differences in obstructive and restrictive respiratory diseases with examples of each. Intimacy, Sex, and Sexuality differences are reviewed with a discussion on how relationships can change when diagnosed with pulmonary disease. Common sexual concerns are reviewed.   MD DAY -A group question and answer session with a medical doctor that allows participants to ask questions that relate to their pulmonary disease state.   OTHER EDUCATION -Group or individual verbal, written, or video instructions that support the educational goals of the pulmonary rehab program.   Holiday Eating Survival Tips:  -Group instruction provided by PowerPoint slides, verbal discussion, and written materials to support subject matter. The instructor gives patients tips, tricks, and techniques to help them not only survive but enjoy the holidays despite the onslaught of food that accompanies the holidays.   Knowledge Questionnaire Score:   Core Components/Risk Factors/Patient Goals at Admission: Personal Goals and Risk Factors at Admission - 05/06/19 1533      Core Components/Risk Factors/Patient  Goals on Admission   Improve shortness of breath with ADL's  Yes    Intervention  Provide  education, individualized exercise plan and daily activity instruction to help decrease symptoms of SOB with activities of daily living.    Expected Outcomes  Short Term: Improve cardiorespiratory fitness to achieve a reduction of symptoms when performing ADLs;Long Term: Be able to perform more ADLs without symptoms or delay the onset of symptoms       Core Components/Risk Factors/Patient Goals Review:  Goals and Risk Factor Review    Row Name 05/06/19 1533             Core Components/Risk Factors/Patient Goals Review   Personal Goals Review  Develop more efficient breathing techniques such as purse lipped breathing and diaphragmatic breathing and practicing self-pacing with activity.;Increase knowledge of respiratory medications and ability to use respiratory devices properly.;Improve shortness of breath with ADL's          Core Components/Risk Factors/Patient Goals at Discharge (Final Review):  Goals and Risk Factor Review - 05/06/19 1533      Core Components/Risk Factors/Patient Goals Review   Personal Goals Review  Develop more efficient breathing techniques such as purse lipped breathing and diaphragmatic breathing and practicing self-pacing with activity.;Increase knowledge of respiratory medications and ability to use respiratory devices properly.;Improve shortness of breath with ADL's       ITP Comments:   Comments:

## 2019-05-06 NOTE — Progress Notes (Signed)
Timothy Spencer 78 y.o. male Pulmonary Rehab Orientation Note Patient arrived today in Cardiac and Pulmonary Rehab for orientation to Pulmonary Rehab. He was dropped off at the [facility] entrance by his wife and picked up there. He walks with a cane and has a shuffle gaitHe does not carry portable oxygen. Per pt, he uses oxygen never. Color good, skin warm and dry. Patient is oriented to time and place. Patient's medical history, psychosocial health, and medications reviewed. Psychosocial assessment reveals pt lives with their spouse. Pt is currently retired.  Pt reports his stress level is low.  Pt does not exhibit signs of depression. He does have a history of depression and has been on Zoloft for several years and feels his depression is stable.  PHQ2/9 score 0/3. Pt shows good  coping skills with positive outlook . Will continue to monitor and evaluate progress toward psychosocial goal(s) of no barriers or psychosocial concerns while participating in pulmonary rehab. Physical assessment reveals heart rate is tachycardic, breath sounds clear to auscultation, no wheezes, rales, or rhonchi. Grip strength equal, strong. Patient reports he does take medications as prescribed. Patient states he follows a Regular diet. The patient reports no specific efforts to gain or lose weight.. Patient's weight will be monitored closely. Demonstration and practice of PLB using pulse oximeter. Patient able to return demonstration satisfactorily. Safety and hand hygiene in the exercise area reviewed with patient. Patient voices understanding of the information reviewed. Department expectations discussed with patient and achievable goals were set. The patient shows enthusiasm about attending the program and we look forward to working with this nice gentleman. The patient completed a 6 min walk test on today and to begin exercise on Tuesday, May 13, 2019 in the 1315 class.  [id number]

## 2019-05-12 DIAGNOSIS — E039 Hypothyroidism, unspecified: Secondary | ICD-10-CM | POA: Diagnosis not present

## 2019-05-13 ENCOUNTER — Other Ambulatory Visit: Payer: Self-pay

## 2019-05-13 ENCOUNTER — Encounter (HOSPITAL_COMMUNITY)
Admission: RE | Admit: 2019-05-13 | Discharge: 2019-05-13 | Disposition: A | Discharge status: Home / Self Care | Payer: Medicare Other | Source: Ambulatory Visit | Attending: Internal Medicine | Admitting: Internal Medicine

## 2019-05-13 VITALS — Wt 166.9 lb

## 2019-05-13 DIAGNOSIS — J449 Chronic obstructive pulmonary disease, unspecified: Secondary | ICD-10-CM | POA: Diagnosis not present

## 2019-05-13 NOTE — Progress Notes (Signed)
Pulmonary Individual Treatment Plan  Patient Details  Name: Timothy Spencer MRN: [id number] Date of Birth: 09-11-1940 Referring Provider:     Pulmonary Rehab Walk Test from 05/06/2019 in [facility]  Referring Provider  Dr. Renne Crigler      Initial Encounter Date:    Pulmonary Rehab Walk Test from 05/06/2019 in [facility] CARDIAC REHAB  Date  05/06/19      Visit Diagnosis: Chronic obstructive pulmonary disease, unspecified COPD type (HCC)  Patient's Home Medications on Admission:   Current Outpatient Medications:  .  aspirin EC 81 MG tablet, Take 81 mg by mouth daily., Disp: , Rfl:  .  cholecalciferol (VITAMIN D) 1000 units tablet, Take 1,000 Units by mouth daily., Disp: , Rfl:  .  Fluticasone-Umeclidin-Vilant (TRELEGY ELLIPTA) 100-62.5-25 MCG/INH AEPB, Inhale 1 puff into the lungs daily., Disp: 60 each, Rfl: 0 .  isosorbide mononitrate (IMDUR) 60 MG 24 hr tablet, TAKE 1 AND 1/2 TABLETS(90 MG) BY MOUTH DAILY, Disp: 135 tablet, Rfl: 2 .  levothyroxine (SYNTHROID, LEVOTHROID) 175 MCG tablet, Take 1 tablet by mouth daily., Disp: , Rfl: 5 .  pravastatin (PRAVACHOL) 20 MG tablet, Take 1 tablet (20 mg total) by mouth every evening., Disp: 30 tablet, Rfl: 5 .  ranitidine (ZANTAC) 150 MG tablet, Take 150 mg by mouth 2 (two) times daily., Disp: , Rfl:  .  ranolazine (RANEXA) 500 MG 12 hr tablet, Take 500 mg by mouth daily. , Disp: , Rfl:  .  rOPINIRole (REQUIP) 1 MG tablet, Take 1 mg by mouth 2 (two) times daily. , Disp: , Rfl:  .  sertraline (ZOLOFT) 100 MG tablet, Take 100 mg by mouth daily. , Disp: , Rfl:  .  tamsulosin (FLOMAX) 0.4 MG CAPS capsule, Take 0.4 mg by mouth daily. , Disp: , Rfl:  .  traMADol (ULTRAM) 50 MG tablet, 1 TABLET BY MOUTH DAILY, Disp: , Rfl: 0  Past Medical History: Past Medical History:  Diagnosis Date  . Anxiety and depression   . BPH (benign prostatic hyperplasia)   . CAD (coronary artery disease)   . COPD (chronic  obstructive pulmonary disease) (HCC)   . DOE (dyspnea on exertion)   . ED (erectile dysfunction)   . Emphysema of lung (HCC)   . First degree AV block   . GERD (gastroesophageal reflux disease)   . Glaucoma   . Gout   . Gynecomastia   . H/O ETOH abuse   . Hemochromatosis   . Hyperlipidemia   . Hypertension   . Hypothyroidism   . Macular degeneration   . OSA (obstructive sleep apnea)   . Osteopenia   . Peripheral neuropathy   . RLS (restless legs syndrome)   . Thrombocytopenia (HCC)     Tobacco Use: Social History   Tobacco Use  Smoking Status Former Smoker  . Packs/day: 2.00  . Years: 50.00  . Pack years: 100.00  . Types: Cigarettes  . Start date: 04/11/1960  . Quit date: 03/19/2011  . Years since quitting: 8.1  Smokeless Tobacco Never Used  Tobacco Comment   Quit for 1 year total     Labs: Recent Review Flowsheet Data    Labs for ITP Cardiac and Pulmonary Rehab Latest Ref Rng & Units 06/03/2010   TCO2 0 - 100 mmol/L 22      Capillary Blood Glucose: No results found for: GLUCAP   Pulmonary Assessment Scores: Pulmonary Assessment Scores    Row Name 05/06/19 1541 05/13/19 1506  ADL UCSD   ADL Phase  Entry  Entry    SOB Score total  --  83      CAT Score   CAT Score  --  20      mMRC Score   mMRC Score  2  --      UCSD: Self-administered rating of dyspnea associated with activities of daily living (ADLs) 6-point scale (0 = "not at all" to 5 = "maximal or unable to do because of breathlessness")  Scoring Scores range from 0 to 120.  Minimally important difference is 5 units  CAT: CAT can identify the health impairment of COPD patients and is better correlated with disease progression.  CAT has a scoring range of zero to 40. The CAT score is classified into four groups of low (less than 10), medium (10 - 20), high (21-30) and very high (31-40) based on the impact level of disease on health status. A CAT score over 10 suggests significant  symptoms.  A worsening CAT score could be explained by an exacerbation, poor medication adherence, poor inhaler technique, or progression of COPD or comorbid conditions.  CAT MCID is 2 points  mMRC: mMRC (Modified Medical Research Council) Dyspnea Scale is used to assess the degree of baseline functional disability in patients of respiratory disease due to dyspnea. No minimal important difference is established. A decrease in score of 1 point or greater is considered a positive change.   Pulmonary Function Assessment: Pulmonary Function Assessment - 05/06/19 1532      Breath   Bilateral Breath Sounds  Clear    Shortness of Breath  Yes;Limiting activity       Exercise Target Goals: Exercise Program Goal: Individual exercise prescription set using results from initial 6 min walk test and THRR while considering  patient's activity barriers and safety.   Exercise Prescription Goal: Initial exercise prescription builds to 30-45 minutes a day of aerobic activity, 2-3 days per week.  Home exercise guidelines will be given to patient during program as part of exercise prescription that the participant will acknowledge.  Activity Barriers & Risk Stratification: Activity Barriers & Cardiac Risk Stratification - 05/06/19 1459      Activity Barriers & Cardiac Risk Stratification   Activity Barriers  Back Problems;Deconditioning;Muscular Weakness;Shortness of Breath       6 Minute Walk: 6 Minute Walk    Row Name 05/06/19 1546         6 Minute Walk   Phase  Initial     Distance  964 feet     Walk Time  6 minutes     # of Rest Breaks  0     MPH  1.83     METS  2.88     RPE  12     Perceived Dyspnea   1     VO2 Peak  10.88     Symptoms  Yes (comment)     Comments  used wheelchair     Resting HR  95 bpm     Resting BP  110/54     Resting Oxygen Saturation   96 %     Exercise Oxygen Saturation  during 6 min walk  96 %     Max Ex. HR  137 bpm     Max Ex. BP  160/64     2 Minute  Post BP  120/64       Interval HR   1 Minute HR  112  2 Minute HR  133     3 Minute HR  130     4 Minute HR  137     5 Minute HR  132     6 Minute HR  130     2 Minute Post HR  106     Interval Heart Rate?  Yes       Interval Oxygen   Interval Oxygen?  Yes     Baseline Oxygen Saturation %  96 %     1 Minute Oxygen Saturation %  96 %     1 Minute Liters of Oxygen  0 L     2 Minute Oxygen Saturation %  97 %     2 Minute Liters of Oxygen  0 L     3 Minute Oxygen Saturation %  96 %     3 Minute Liters of Oxygen  0 L     4 Minute Oxygen Saturation %  97 %     4 Minute Liters of Oxygen  0 L     5 Minute Oxygen Saturation %  97 %     5 Minute Liters of Oxygen  0 L     6 Minute Oxygen Saturation %  96 %     6 Minute Liters of Oxygen  0 L     2 Minute Post Oxygen Saturation %  97 %     2 Minute Post Liters of Oxygen  0 L        Oxygen Initial Assessment: Oxygen Initial Assessment - 05/06/19 1541      Home Oxygen   Home Oxygen Device  None    Sleep Oxygen Prescription  None    Home Exercise Oxygen Prescription  None    Home at Rest Exercise Oxygen Prescription  None    Compliance with Home Oxygen Use  Yes      Initial 6 min Walk   Oxygen Used  None      Program Oxygen Prescription   Program Oxygen Prescription  None      Intervention   Short Term Goals  To learn and exhibit compliance with exercise, home and travel O2 prescription;To learn and understand importance of monitoring SPO2 with pulse oximeter and demonstrate accurate use of the pulse oximeter.;To learn and understand importance of maintaining oxygen saturations>88%;To learn and demonstrate proper pursed lip breathing techniques or other breathing techniques.;To learn and demonstrate proper use of respiratory medications    Long  Term Goals  Exhibits compliance with exercise, home and travel O2 prescription;Verbalizes importance of monitoring SPO2 with pulse oximeter and return demonstration;Maintenance of O2  saturations>88%;Exhibits proper breathing techniques, such as pursed lip breathing or other method taught during program session       Oxygen Re-Evaluation: Oxygen Re-Evaluation    Row Name 05/13/19 1512             Program Oxygen Prescription   Program Oxygen Prescription  None         Home Oxygen   Home Oxygen Device  None       Sleep Oxygen Prescription  None       Home Exercise Oxygen Prescription  None       Home at Rest Exercise Oxygen Prescription  None       Compliance with Home Oxygen Use  Yes         Goals/Expected Outcomes   Short Term Goals  To learn and exhibit compliance with exercise, home and  travel O2 prescription;To learn and understand importance of monitoring SPO2 with pulse oximeter and demonstrate accurate use of the pulse oximeter.;To learn and understand importance of maintaining oxygen saturations>88%;To learn and demonstrate proper pursed lip breathing techniques or other breathing techniques.;To learn and demonstrate proper use of respiratory medications       Long  Term Goals  Exhibits compliance with exercise, home and travel O2 prescription;Verbalizes importance of monitoring SPO2 with pulse oximeter and return demonstration;Maintenance of O2 saturations>88%;Exhibits proper breathing techniques, such as pursed lip breathing or other method taught during program session       Goals/Expected Outcomes  compliance          Oxygen Discharge (Final Oxygen Re-Evaluation): Oxygen Re-Evaluation - 05/13/19 1512      Program Oxygen Prescription   Program Oxygen Prescription  None      Home Oxygen   Home Oxygen Device  None    Sleep Oxygen Prescription  None    Home Exercise Oxygen Prescription  None    Home at Rest Exercise Oxygen Prescription  None    Compliance with Home Oxygen Use  Yes      Goals/Expected Outcomes   Short Term Goals  To learn and exhibit compliance with exercise, home and travel O2 prescription;To learn and understand importance of  monitoring SPO2 with pulse oximeter and demonstrate accurate use of the pulse oximeter.;To learn and understand importance of maintaining oxygen saturations>88%;To learn and demonstrate proper pursed lip breathing techniques or other breathing techniques.;To learn and demonstrate proper use of respiratory medications    Long  Term Goals  Exhibits compliance with exercise, home and travel O2 prescription;Verbalizes importance of monitoring SPO2 with pulse oximeter and return demonstration;Maintenance of O2 saturations>88%;Exhibits proper breathing techniques, such as pursed lip breathing or other method taught during program session    Goals/Expected Outcomes  compliance       Initial Exercise Prescription: Initial Exercise Prescription - 05/06/19 1500      Date of Initial Exercise RX and Referring Provider   Date  05/06/19    Referring Provider  Dr. Shelia Media      Recumbant Bike   Level  1    Minutes  15      Arm Ergometer   Level  1    Minutes  15      Prescription Details   Frequency (times per week)  2    Duration  Progress to 30 minutes of continuous aerobic without signs/symptoms of physical distress      Intensity   THRR 40-80% of Max Heartrate  56-113    Ratings of Perceived Exertion  11-13    Perceived Dyspnea  0-4      Progression   Progression  Continue to progress workloads to maintain intensity without signs/symptoms of physical distress.      Resistance Training   Training Prescription  Yes    Weight  orange bands    Reps  10-15       Perform Capillary Blood Glucose checks as needed.  Exercise Prescription Changes: Exercise Prescription Changes    Row Name 05/13/19 1500             Response to Exercise   Blood Pressure (Admit)  126/70       Blood Pressure (Exercise)  130/62       Blood Pressure (Exit)  112/62       Heart Rate (Admit)  106 bpm       Heart Rate (Exercise)  116 bpm  Heart Rate (Exit)  104 bpm       Oxygen Saturation (Admit)  100 %        Oxygen Saturation (Exercise)  95 %       Oxygen Saturation (Exit)  97 %       Rating of Perceived Exertion (Exercise)  11       Perceived Dyspnea (Exercise)  1       Duration  Continue with 30 min of aerobic exercise without signs/symptoms of physical distress.       Intensity  THRR unchanged         Progression   Progression  Continue to progress workloads to maintain intensity without signs/symptoms of physical distress.         Resistance Training   Training Prescription  Yes       Weight  orange bands       Reps  10-15       Time  10 Minutes         Recumbant Bike   Level  1.5       Minutes  15         Arm Ergometer   Level  1       Minutes  15          Exercise Comments:   Exercise Goals and Review: Exercise Goals    Row Name 05/06/19 1603 05/13/19 1515           Exercise Goals   Increase Physical Activity  Yes  Yes      Intervention  Provide advice, education, support and counseling about physical activity/exercise needs.;Develop an individualized exercise prescription for aerobic and resistive training based on initial evaluation findings, risk stratification, comorbidities and participant's personal goals.  Provide advice, education, support and counseling about physical activity/exercise needs.;Develop an individualized exercise prescription for aerobic and resistive training based on initial evaluation findings, risk stratification, comorbidities and participant's personal goals.      Expected Outcomes  Short Term: Attend rehab on a regular basis to increase amount of physical activity.;Long Term: Add in home exercise to make exercise part of routine and to increase amount of physical activity.;Long Term: Exercising regularly at least 3-5 days a week.  Short Term: Attend rehab on a regular basis to increase amount of physical activity.;Long Term: Add in home exercise to make exercise part of routine and to increase amount of physical activity.;Long Term:  Exercising regularly at least 3-5 days a week.      Increase Strength and Stamina  Yes  Yes      Intervention  Provide advice, education, support and counseling about physical activity/exercise needs.;Develop an individualized exercise prescription for aerobic and resistive training based on initial evaluation findings, risk stratification, comorbidities and participant's personal goals.  Provide advice, education, support and counseling about physical activity/exercise needs.;Develop an individualized exercise prescription for aerobic and resistive training based on initial evaluation findings, risk stratification, comorbidities and participant's personal goals.      Expected Outcomes  Short Term: Increase workloads from initial exercise prescription for resistance, speed, and METs.;Short Term: Perform resistance training exercises routinely during rehab and add in resistance training at home;Long Term: Improve cardiorespiratory fitness, muscular endurance and strength as measured by increased METs and functional capacity ( )  Short Term: Increase workloads from initial exercise prescription for resistance, speed, and METs.;Short Term: Perform resistance training exercises routinely during rehab and add in resistance training at home;Long Term: Improve cardiorespiratory fitness, muscular endurance and strength as  measured by increased METs and functional capacity ( )      Able to understand and use rate of perceived exertion (RPE) scale  Yes  Yes      Intervention  Provide education and explanation on how to use RPE scale  Provide education and explanation on how to use RPE scale      Expected Outcomes  Long Term:  Able to use RPE to guide intensity level when exercising independently;Short Term: Able to use RPE daily in rehab to express subjective intensity level  Long Term:  Able to use RPE to guide intensity level when exercising independently;Short Term: Able to use RPE daily in rehab to express  subjective intensity level      Able to understand and use Dyspnea scale  Yes  Yes      Intervention  Provide education and explanation on how to use Dyspnea scale  Provide education and explanation on how to use Dyspnea scale      Expected Outcomes  Long Term: Able to use Dyspnea scale to guide intensity level when exercising independently;Short Term: Able to use Dyspnea scale daily in rehab to express subjective sense of shortness of breath during exertion  Long Term: Able to use Dyspnea scale to guide intensity level when exercising independently;Short Term: Able to use Dyspnea scale daily in rehab to express subjective sense of shortness of breath during exertion      Knowledge and understanding of Target Heart Rate Range (THRR)  Yes  Yes      Intervention  --  Provide education and explanation of THRR including how the numbers were predicted and where they are located for reference      Expected Outcomes  Short Term: Able to state/look up THRR;Short Term: Able to use daily as guideline for intensity in rehab;Long Term: Able to use THRR to govern intensity when exercising independently  Short Term: Able to state/look up THRR;Short Term: Able to use daily as guideline for intensity in rehab;Long Term: Able to use THRR to govern intensity when exercising independently      Understanding of Exercise Prescription  Yes  Yes      Intervention  Provide education, explanation, and written materials on patient's individual exercise prescription  Provide education, explanation, and written materials on patient's individual exercise prescription      Expected Outcomes  Short Term: Able to explain program exercise prescription;Long Term: Able to explain home exercise prescription to exercise independently  Short Term: Able to explain program exercise prescription;Long Term: Able to explain home exercise prescription to exercise independently         Exercise Goals Re-Evaluation : Exercise Goals Re-Evaluation     Row Name 05/13/19 1516             Exercise Goal Re-Evaluation   Exercise Goals Review  Increase Physical Activity;Increase Strength and Stamina;Able to understand and use rate of perceived exertion (RPE) scale;Able to understand and use Dyspnea scale;Knowledge and understanding of Target Heart Rate Range (THRR);Understanding of Exercise Prescription       Comments  Pt has completed 1 exercise session. Pt has voiced his desire to use TM in program, as he uses it at home. Advised pt against using TM and will not put pt on TM in program due to my concerns of his balance. Pt exercised at 2.5 METs on the rec bike.Will continue to monitor and progress as able.       Expected Outcomes  Through exercise at rehab and at home,  the patient will decrease shortness of breath with daily activities and feel confident in carrying out an exercise regime at home.          Discharge Exercise Prescription (Final Exercise Prescription Changes): Exercise Prescription Changes - 05/13/19 1500      Response to Exercise   Blood Pressure (Admit)  126/70    Blood Pressure (Exercise)  130/62    Blood Pressure (Exit)  112/62    Heart Rate (Admit)  106 bpm    Heart Rate (Exercise)  116 bpm    Heart Rate (Exit)  104 bpm    Oxygen Saturation (Admit)  100 %    Oxygen Saturation (Exercise)  95 %    Oxygen Saturation (Exit)  97 %    Rating of Perceived Exertion (Exercise)  11    Perceived Dyspnea (Exercise)  1    Duration  Continue with 30 min of aerobic exercise without signs/symptoms of physical distress.    Intensity  THRR unchanged      Progression   Progression  Continue to progress workloads to maintain intensity without signs/symptoms of physical distress.      Resistance Training   Training Prescription  Yes    Weight  orange bands    Reps  10-15    Time  10 Minutes      Recumbant Bike   Level  1.5    Minutes  15      Arm Ergometer   Level  1    Minutes  15       Nutrition:  Target Goals:  Understanding of nutrition guidelines, daily intake of sodium 1500mg , cholesterol 200mg , calories 30% from fat and 7% or less from saturated fats, daily to have 5 or more servings of fruits and vegetables.  Biometrics: Pre Biometrics - 05/06/19 1500      Pre Biometrics   Grip Strength  24 kg        Nutrition Therapy Plan and Nutrition Goals:   Nutrition Assessments:   Nutrition Goals Re-Evaluation:   Nutrition Goals Discharge (Final Nutrition Goals Re-Evaluation):   Psychosocial: Target Goals: Acknowledge presence or absence of significant depression and/or stress, maximize coping skills, provide positive support system. Participant is able to verbalize types and ability to use techniques and skills needed for reducing stress and depression.  Initial Review & Psychosocial Screening: Initial Psych Review & Screening - 05/06/19 1532      Initial Review   Current issues with  History of Depression      Family Dynamics   Good Support System?  Yes      Barriers   Psychosocial barriers to participate in program  There are no identifiable barriers or psychosocial needs.      Screening Interventions   Interventions  Encouraged to exercise       Quality of Life Scores:  Scores of 19 and below usually indicate a poorer quality of life in these areas.  A difference of  2-3 points is a clinically meaningful difference.  A difference of 2-3 points in the total score of the Quality of Life Index has been associated with significant improvement in overall quality of life, self-image, physical symptoms, and general health in studies assessing change in quality of life.  PHQ-9: Recent Review Flowsheet Data    [facility]   Decreased Interest 0   Down, Depressed, Hopeless 0   PHQ - 2 Score 0   Altered sleeping 0   Tired, decreased energy 3  Change in appetite 0   Feeling bad or failure about yourself  0   Trouble concentrating 0   Moving slowly or  fidgety/restless 0   PHQ-9 Score 3   Difficult doing work/chores Not difficult at all     Interpretation of Total Score  Total Score Depression Severity:  1-4 = Minimal depression, 5-9 = Mild depression, 10-14 = Moderate depression, 15-19 = Moderately severe depression, 20-27 = Severe depression   Psychosocial Evaluation and Intervention: Psychosocial Evaluation - 05/06/19 1532      Psychosocial Evaluation & Interventions   Interventions  Encouraged to exercise with the program and follow exercise prescription    Continue Psychosocial Services   No Follow up required       Psychosocial Re-Evaluation: Psychosocial Re-Evaluation    Row Name 05/06/19 1533 05/13/19 1544           Psychosocial Re-Evaluation   Current issues with  History of Depression  History of Depression      Comments  --  Depression is stable per patient, no signs of depression voiced by patient.      Expected Outcomes  --  For patient to have no barriers or psychosocial concerns while participating in pulmonary rehab.      Interventions  Encouraged to attend Pulmonary Rehabilitation for the exercise  Encouraged to attend Pulmonary Rehabilitation for the exercise      Continue Psychosocial Services   --  No Follow up required         Psychosocial Discharge (Final Psychosocial Re-Evaluation): Psychosocial Re-Evaluation - 05/13/19 1544      Psychosocial Re-Evaluation   Current issues with  History of Depression    Comments  Depression is stable per patient, no signs of depression voiced by patient.    Expected Outcomes  For patient to have no barriers or psychosocial concerns while participating in pulmonary rehab.    Interventions  Encouraged to attend Pulmonary Rehabilitation for the exercise    Continue Psychosocial Services   No Follow up required       Education: Education Goals: Education classes will be provided on a weekly basis, covering required topics. Participant will state understanding/return  demonstration of topics presented.  Learning Barriers/Preferences:   Education Topics: Risk Factor Reduction:  -Group instruction that is supported by a PowerPoint presentation. Instructor discusses the definition of a risk factor, different risk factors for pulmonary disease, and how the heart and lungs work together.     Nutrition for Pulmonary Patient:  -Group instruction provided by PowerPoint slides, verbal discussion, and written materials to support subject matter. The instructor gives an explanation and review of healthy diet recommendations, which includes a discussion on weight management, recommendations for fruit and vegetable consumption, as well as protein, fluid, caffeine, fiber, sodium, sugar, and alcohol. Tips for eating when patients are short of breath are discussed.   Pursed Lip Breathing:  -Group instruction that is supported by demonstration and informational handouts. Instructor discusses the benefits of pursed lip and diaphragmatic breathing and detailed demonstration on how to preform both.     Oxygen Safety:  -Group instruction provided by PowerPoint, verbal discussion, and written material to support subject matter. There is an overview of "What is Oxygen" and "Why do we need it".  Instructor also reviews how to create a safe environment for oxygen use, the importance of using oxygen as prescribed, and the risks of noncompliance. There is a brief discussion on traveling with oxygen and resources the patient may utilize.  Oxygen Equipment:  -Group instruction provided by [facility] Staff utilizing handouts, written materials, and equipment demonstrations.   Signs and Symptoms:  -Group instruction provided by written material and verbal discussion to support subject matter. Warning signs and symptoms of infection, stroke, and heart attack are reviewed and when to call the physician/911 reinforced. Tips for preventing the spread of infection  discussed.   Advanced Directives:  -Group instruction provided by verbal instruction and written material to support subject matter. Instructor reviews Advanced Directive laws and proper instruction for filling out document.   Pulmonary Video:  -Group video education that reviews the importance of medication and oxygen compliance, exercise, good nutrition, pulmonary hygiene, and pursed lip and diaphragmatic breathing for the pulmonary patient.   Exercise for the Pulmonary Patient:  -Group instruction that is supported by a PowerPoint presentation. Instructor discusses benefits of exercise, core components of exercise, frequency, duration, and intensity of an exercise routine, importance of utilizing pulse oximetry during exercise, safety while exercising, and options of places to exercise outside of rehab.     Pulmonary Medications:  -Verbally interactive group education provided by instructor with focus on inhaled medications and proper administration.   Anatomy and Physiology of the Respiratory System and Intimacy:  -Group instruction provided by PowerPoint, verbal discussion, and written material to support subject matter. Instructor reviews respiratory cycle and anatomical components of the respiratory system and their functions. Instructor also reviews differences in obstructive and restrictive respiratory diseases with examples of each. Intimacy, Sex, and Sexuality differences are reviewed with a discussion on how relationships can change when diagnosed with pulmonary disease. Common sexual concerns are reviewed.   MD DAY -A group question and answer session with a medical doctor that allows participants to ask questions that relate to their pulmonary disease state.   OTHER EDUCATION -Group or individual verbal, written, or video instructions that support the educational goals of the pulmonary rehab program.   Holiday Eating Survival Tips:  -Group instruction provided by  PowerPoint slides, verbal discussion, and written materials to support subject matter. The instructor gives patients tips, tricks, and techniques to help them not only survive but enjoy the holidays despite the onslaught of food that accompanies the holidays.   Knowledge Questionnaire Score: Knowledge Questionnaire Score - 05/13/19 1505      Knowledge Questionnaire Score   Pre Score  14/18       Core Components/Risk Factors/Patient Goals at Admission: Personal Goals and Risk Factors at Admission - 05/06/19 1533      Core Components/Risk Factors/Patient Goals on Admission   Improve shortness of breath with ADL's  Yes    Intervention  Provide education, individualized exercise plan and daily activity instruction to help decrease symptoms of SOB with activities of daily living.    Expected Outcomes  Short Term: Improve cardiorespiratory fitness to achieve a reduction of symptoms when performing ADLs;Long Term: Be able to perform more ADLs without symptoms or delay the onset of symptoms       Core Components/Risk Factors/Patient Goals Review:  Goals and Risk Factor Review    Row Name 05/06/19 1533 05/13/19 1545           Core Components/Risk Factors/Patient Goals Review   Personal Goals Review  Develop more efficient breathing techniques such as purse lipped breathing and diaphragmatic breathing and practicing self-pacing with activity.;Increase knowledge of respiratory medications and ability to use respiratory devices properly.;Improve shortness of breath with ADL's  Develop more efficient breathing techniques such as purse lipped breathing and  diaphragmatic breathing and practicing self-pacing with activity.;Increase knowledge of respiratory medications and ability to use respiratory devices properly.;Improve shortness of breath with ADL's      Review  --  Ron just started pulmonary rehab today, too early to see progression to goals.      Expected Outcomes  --  See admission goals.          Core Components/Risk Factors/Patient Goals at Discharge (Final Review):  Goals and Risk Factor Review - 05/13/19 1545      Core Components/Risk Factors/Patient Goals Review   Personal Goals Review  Develop more efficient breathing techniques such as purse lipped breathing and diaphragmatic breathing and practicing self-pacing with activity.;Increase knowledge of respiratory medications and ability to use respiratory devices properly.;Improve shortness of breath with ADL's    Review  Ron just started pulmonary rehab today, too early to see progression to goals.    Expected Outcomes  See admission goals.       ITP Comments:   Comments: ITP REVIEW Pt is making expected progress toward pulmonary rehab goals after completing 1 sessions. Recommend continued exercise, life style modification, education, and utilization of breathing techniques to increase stamina and strength and decrease shortness of breath with exertion.

## 2019-05-13 NOTE — Progress Notes (Signed)
Daily Session Note  Patient Details  Name: Timothy Spencer MRN: [id number] Date of Birth: [birth year] Referring Provider:     Pulmonary Rehab Walk Test from 05/06/2019 in [place]  Referring Provider  Dr. Shelia Media      Encounter Date: 05/13/2019  Check In: Session Check In - 05/13/19 1509      Check-In   Supervising physician immediately available to respond to emergencies  [facility] immediately available    Physician(s)  Dr. Earnest Conroy    Location  [facility]    Staff Present  Rosebud Poles, RN, Bjorn Loser, MS, Exercise Physiologist;Lisa Ysidro Evert, RN    Virtual Visit  No    Medication changes reported      No    Fall or balance concerns reported     No    Tobacco Cessation  No Change    Warm-up and Cool-down  Performed on first and last piece of equipment    Resistance Training Performed  Yes    VAD Patient?  No    PAD/SET Patient?  No      Pain Assessment   Currently in Pain?  No/denies       Capillary Blood Glucose: No results found for this or any previous visit (from the past 24 hour(s)).  Exercise Prescription Changes - 05/13/19 1500      Response to Exercise   Blood Pressure (Admit)  126/70    Blood Pressure (Exercise)  130/62    Blood Pressure (Exit)  112/62    Heart Rate (Admit)  106 bpm    Heart Rate (Exercise)  116 bpm    Heart Rate (Exit)  104 bpm    Oxygen Saturation (Admit)  100 %    Oxygen Saturation (Exercise)  95 %    Oxygen Saturation (Exit)  97 %    Rating of Perceived Exertion (Exercise)  11    Perceived Dyspnea (Exercise)  1    Duration  Continue with 30 min of aerobic exercise without signs/symptoms of physical distress.    Intensity  THRR unchanged      Progression   Progression  Continue to progress workloads to maintain intensity without signs/symptoms of physical distress.      Resistance Training   Training Prescription  Yes    Weight  orange bands    Reps  10-15    Time   10 Minutes      Recumbant Bike   Level  1.5    Minutes  15      Arm Ergometer   Level  1    Minutes  15       Social History   Tobacco Use  Smoking Status Former Smoker  . Packs/day: 2.00  . Years: 50.00  . Pack years: 100.00  . Types: Cigarettes  . Start date: 04/11/1960  . Quit date: 03/19/2011  . Years since quitting: 8.1  Smokeless Tobacco Never Used  Tobacco Comment   Quit for 1 year total     Goals Met:  Independence with exercise equipment Exercise tolerated well Strength training completed today  Goals Unmet:  Not Applicable  Comments: Service time is from 1308 to 1414    Dr. Rush Farmer is Medical Director for Pulmonary Rehab at [facility].

## 2019-05-15 ENCOUNTER — Other Ambulatory Visit: Payer: Self-pay

## 2019-05-15 ENCOUNTER — Encounter (HOSPITAL_COMMUNITY)
Admission: RE | Admit: 2019-05-15 | Discharge: 2019-05-15 | Disposition: A | Discharge status: Home / Self Care | Payer: Medicare Other | Source: Ambulatory Visit | Attending: Internal Medicine | Admitting: Internal Medicine

## 2019-05-15 DIAGNOSIS — J449 Chronic obstructive pulmonary disease, unspecified: Secondary | ICD-10-CM

## 2019-05-15 NOTE — Progress Notes (Signed)
Daily Session Note  Patient Details  Name: Timothy Spencer MRN: [id number] Date of Birth: March 02, 1941 Referring Provider:     Pulmonary Rehab Walk Test from 05/06/2019 in [place]  Referring Provider  Dr. Shelia Media      Encounter Date: 05/15/2019  Check In: Session Check In - 05/15/19 1440      Check-In   Supervising physician immediately available to respond to emergencies  [facility] immediately available    Physician(s)  Dr. Sloan Leiter    Location  [facility]    Staff Present  Rosebud Poles, RN, Bjorn Loser, MS, Exercise Physiologist;Dailey Buccheri Ysidro Evert, RN    Virtual Visit  No    Medication changes reported      No    Fall or balance concerns reported     No    Tobacco Cessation  No Change    Warm-up and Cool-down  Performed on first and last piece of equipment    Resistance Training Performed  Yes    VAD Patient?  No    PAD/SET Patient?  No      Pain Assessment   Currently in Pain?  No/denies    Multiple Pain Sites  No       Capillary Blood Glucose: No results found for this or any previous visit (from the past 24 hour(s)).    Social History   Tobacco Use  Smoking Status Former Smoker  . Packs/day: 2.00  . Years: 50.00  . Pack years: 100.00  . Types: Cigarettes  . Start date: 04/11/1960  . Quit date: 03/19/2011  . Years since quitting: 8.1  Smokeless Tobacco Never Used  Tobacco Comment   Quit for 1 year total     Goals Met:  Exercise tolerated well No report of cardiac concerns or symptoms Strength training completed today  Goals Unmet:  Not Applicable  Comments: Service time is from 1300 to 1400    Dr. Rush Farmer is Medical Director for Pulmonary Rehab at [facility].

## 2019-05-20 ENCOUNTER — Other Ambulatory Visit: Payer: Self-pay

## 2019-05-20 ENCOUNTER — Encounter (HOSPITAL_COMMUNITY)
Admission: RE | Admit: 2019-05-20 | Discharge: 2019-05-20 | Disposition: A | Discharge status: Home / Self Care | Payer: Medicare Other | Source: Ambulatory Visit | Attending: Internal Medicine | Admitting: Internal Medicine

## 2019-05-20 VITALS — Wt 166.4 lb

## 2019-05-20 DIAGNOSIS — J449 Chronic obstructive pulmonary disease, unspecified: Secondary | ICD-10-CM

## 2019-05-20 NOTE — Progress Notes (Signed)
Daily Session Note  Patient Details  Name: Timothy Spencer MRN: [id number] Date of Birth: [birth year] Referring Provider:     Pulmonary Rehab Walk Test from 05/06/2019 in [place]  Referring Provider  Dr. Shelia Media      Encounter Date: 05/20/2019  Check In: Session Check In - 05/20/19 1402      Check-In   Supervising physician immediately available to respond to emergencies  [facility] immediately available    Physician(s)  Dr. Nevada Crane    Location  [facility]    Staff Present  Rosebud Poles, RN, Bjorn Loser, MS, Exercise Physiologist;Lisa Ysidro Evert, RN    Virtual Visit  No    Medication changes reported      No    Fall or balance concerns reported     No    Tobacco Cessation  No Change    Warm-up and Cool-down  Performed on first and last piece of equipment    Resistance Training Performed  Yes    VAD Patient?  No    PAD/SET Patient?  No      Pain Assessment   Currently in Pain?  No/denies    Multiple Pain Sites  No       Capillary Blood Glucose: No results found for this or any previous visit (from the past 24 hour(s)).    Social History   Tobacco Use  Smoking Status Former Smoker  . Packs/day: 2.00  . Years: 50.00  . Pack years: 100.00  . Types: Cigarettes  . Start date: 04/11/1960  . Quit date: 03/19/2011  . Years since quitting: 8.1  Smokeless Tobacco Never Used  Tobacco Comment   Quit for 1 year total     Goals Met:  Independence with exercise equipment Exercise tolerated well Strength training completed today  Goals Unmet:  Not Applicable  Comments: Service time is from 1300 to 1348    Dr. Rush Farmer is Medical Director for Pulmonary Rehab at [facility].

## 2019-05-22 ENCOUNTER — Encounter (HOSPITAL_COMMUNITY): Payer: Medicare Other

## 2019-05-27 ENCOUNTER — Encounter (HOSPITAL_COMMUNITY): Payer: Medicare Other

## 2019-05-29 ENCOUNTER — Encounter (HOSPITAL_COMMUNITY): Payer: Medicare Other

## 2019-06-03 ENCOUNTER — Encounter (HOSPITAL_COMMUNITY): Payer: Medicare Other

## 2019-06-05 ENCOUNTER — Encounter (HOSPITAL_COMMUNITY): Payer: Medicare Other

## 2019-06-09 ENCOUNTER — Telehealth: Payer: Self-pay | Admitting: Pulmonary Disease

## 2019-06-09 MED ORDER — TRELEGY ELLIPTA 100-62.5-25 MCG/INH IN AEPB: 1.0000 | INHALATION_SPRAY | Freq: Every day | RESPIRATORY_TRACT | 5 refills | Status: DC

## 2019-06-09 NOTE — Telephone Encounter (Signed)
Refill of pt's trelegy has been sent to pt's preferred pharmacy. Called and spoke with pt letting him know this had been done and he verbalized understanding. Nothing further needed.

## 2019-06-10 ENCOUNTER — Encounter (HOSPITAL_COMMUNITY): Payer: Medicare Other

## 2019-06-10 NOTE — Progress Notes (Signed)
Discharge Progress Report  Patient Details  Name: Timothy Spencer MRN: [id number] Date of Birth: 1940-10-04 Referring Provider:     Pulmonary Rehab Walk Test from 05/06/2019 in [place]  Referring Provider  Dr. Shelia Media       Number of Visits: 3 Reason for Discharge:  Early Exit:  Lack of attendance and due to COVID-19 no in person exercise is permitted, he is not interested in virtual pulmonary rehab.  Smoking History:  Social History   Tobacco Use  Smoking Status Former Smoker  . Packs/day: 2.00  . Years: 50.00  . Pack years: 100.00  . Types: Cigarettes  . Start date: 04/11/1960  . Quit date: 03/19/2011  . Years since quitting: 8.2  Smokeless Tobacco Never Used  Tobacco Comment   Quit for 1 year total     Diagnosis:  Chronic obstructive pulmonary disease, unspecified COPD type ([place])  ADL [facility]: Pulmonary Assessment Scores    Row Name 05/06/19 1541 05/13/19 1506       ADL [facility]   ADL Phase  Entry  Entry    SOB Score total  --  83      CAT Score   CAT Score  --  20      mMRC Score   mMRC Score  2  --       Initial Exercise Prescription: Initial Exercise Prescription - 05/06/19 1500      Date of Initial Exercise RX and Referring Provider   Date  05/06/19    Referring Provider  Dr. Shelia Media      Recumbant Bike   Level  1    Minutes  15      Arm Ergometer   Level  1    Minutes  15      Prescription Details   Frequency (times per week)  2    Duration  Progress to 30 minutes of continuous aerobic without signs/symptoms of physical distress      Intensity   THRR 40-80% of Max Heartrate  56-113    Ratings of Perceived Exertion  11-13    Perceived Dyspnea  0-4      Progression   Progression  Continue to progress workloads to maintain intensity without signs/symptoms of physical distress.      Resistance Training   Training Prescription  Yes    Weight  orange bands    Reps  10-15       Discharge Exercise  Prescription (Final Exercise Prescription Changes): Exercise Prescription Changes - 05/20/19 1455      Response to Exercise   Blood Pressure (Admit)  124/70    Blood Pressure (Exercise)  144/70    Blood Pressure (Exit)  122/64    Heart Rate (Admit)  108 bpm    Heart Rate (Exercise)  113 bpm    Heart Rate (Exit)  109 bpm    Oxygen Saturation (Admit)  97 %    Oxygen Saturation (Exercise)  95 %    Oxygen Saturation (Exit)  96 %    Rating of Perceived Exertion (Exercise)  13    Perceived Dyspnea (Exercise)  3    Duration  Continue with 30 min of aerobic exercise without signs/symptoms of physical distress.    Intensity  THRR unchanged      Progression   Progression  Continue to progress workloads to maintain intensity without signs/symptoms of physical distress.      Resistance Training   Training Prescription  Yes  Weight  orange bands    Reps  10-15    Time  10 Minutes      Recumbant Bike   Level  1.5    Minutes  15      Arm Ergometer   Level  1.5    Minutes  15       Functional Capacity: 6 Minute Walk    Row Name 05/06/19 1546         6 Minute Walk   Phase  Initial     Distance  964 feet     Walk Time  6 minutes     # of Rest Breaks  0     MPH  1.83     METS  2.88     RPE  12     Perceived Dyspnea   1     VO2 Peak  10.88     Symptoms  Yes (comment)     Comments  used wheelchair     Resting HR  95 bpm     Resting BP  110/54     Resting Oxygen Saturation   96 %     Exercise Oxygen Saturation  during 6 min walk  96 %     Max Ex. HR  137 bpm     Max Ex. BP  160/64     2 Minute Post BP  120/64       Interval HR   1 Minute HR  112     2 Minute HR  133     3 Minute HR  130     4 Minute HR  137     5 Minute HR  132     6 Minute HR  130     2 Minute Post HR  106     Interval Heart Rate?  Yes       Interval Oxygen   Interval Oxygen?  Yes     Baseline Oxygen Saturation %  96 %     1 Minute Oxygen Saturation %  96 %     1 Minute Liters of Oxygen  0 L      2 Minute Oxygen Saturation %  97 %     2 Minute Liters of Oxygen  0 L     3 Minute Oxygen Saturation %  96 %     3 Minute Liters of Oxygen  0 L     4 Minute Oxygen Saturation %  97 %     4 Minute Liters of Oxygen  0 L     5 Minute Oxygen Saturation %  97 %     5 Minute Liters of Oxygen  0 L     6 Minute Oxygen Saturation %  96 %     6 Minute Liters of Oxygen  0 L     2 Minute Post Oxygen Saturation %  97 %     2 Minute Post Liters of Oxygen  0 L        Psychological, QOL, Others - Outcomes: PHQ 2/9: Depression screen PHQ 2/9 05/06/2019  Decreased Interest 0  Down, Depressed, Hopeless 0  PHQ - 2 Score 0  Altered sleeping 0  Tired, decreased energy 3  Change in appetite 0  Feeling bad or failure about yourself  0  Trouble concentrating 0  Moving slowly or fidgety/restless 0  PHQ-9 Score 3  Difficult doing work/chores Not difficult at all    Quality of Life:  Personal Goals: Goals established at orientation with interventions provided to work toward goal. Personal Goals and Risk Factors at Admission - 05/06/19 1533      Core Components/Risk Factors/Patient Goals on Admission   Improve shortness of breath with ADL's  Yes    Intervention  Provide education, individualized exercise plan and daily activity instruction to help decrease symptoms of SOB with activities of daily living.    Expected Outcomes  Short Term: Improve cardiorespiratory fitness to achieve a reduction of symptoms when performing ADLs;Long Term: Be able to perform more ADLs without symptoms or delay the onset of symptoms        Personal Goals Discharge: Goals and Risk Factor Review    Row Name 05/06/19 1533 05/13/19 1545           Core Components/Risk Factors/Patient Goals Review   Personal Goals Review  Develop more efficient breathing techniques such as purse lipped breathing and diaphragmatic breathing and practicing self-pacing with activity.;Increase knowledge of respiratory medications and  ability to use respiratory devices properly.;Improve shortness of breath with ADL's  Develop more efficient breathing techniques such as purse lipped breathing and diaphragmatic breathing and practicing self-pacing with activity.;Increase knowledge of respiratory medications and ability to use respiratory devices properly.;Improve shortness of breath with ADL's      Review  --  Ron just started pulmonary rehab today, too early to see progression to goals.      Expected Outcomes  --  See admission goals.         Exercise Goals and Review: Exercise Goals    Row Name 05/06/19 1603 05/13/19 1515           Exercise Goals   Increase Physical Activity  Yes  Yes      Intervention  Provide advice, education, support and counseling about physical activity/exercise needs.;Develop an individualized exercise prescription for aerobic and resistive training based on initial evaluation findings, risk stratification, comorbidities and participant's personal goals.  Provide advice, education, support and counseling about physical activity/exercise needs.;Develop an individualized exercise prescription for aerobic and resistive training based on initial evaluation findings, risk stratification, comorbidities and participant's personal goals.      Expected Outcomes  Short Term: Attend rehab on a regular basis to increase amount of physical activity.;Long Term: Add in home exercise to make exercise part of routine and to increase amount of physical activity.;Long Term: Exercising regularly at least 3-5 days a week.  Short Term: Attend rehab on a regular basis to increase amount of physical activity.;Long Term: Add in home exercise to make exercise part of routine and to increase amount of physical activity.;Long Term: Exercising regularly at least 3-5 days a week.      Increase Strength and Stamina  Yes  Yes      Intervention  Provide advice, education, support and counseling about physical activity/exercise  needs.;Develop an individualized exercise prescription for aerobic and resistive training based on initial evaluation findings, risk stratification, comorbidities and participant's personal goals.  Provide advice, education, support and counseling about physical activity/exercise needs.;Develop an individualized exercise prescription for aerobic and resistive training based on initial evaluation findings, risk stratification, comorbidities and participant's personal goals.      Expected Outcomes  Short Term: Increase workloads from initial exercise prescription for resistance, speed, and METs.;Short Term: Perform resistance training exercises routinely during rehab and add in resistance training at home;Long Term: Improve cardiorespiratory fitness, muscular endurance and strength as measured by increased METs and functional capacity ( )  Short Term: Increase  workloads from initial exercise prescription for resistance, speed, and METs.;Short Term: Perform resistance training exercises routinely during rehab and add in resistance training at home;Long Term: Improve cardiorespiratory fitness, muscular endurance and strength as measured by increased METs and functional capacity ( )      Able to understand and use rate of perceived exertion (RPE) scale  Yes  Yes      Intervention  Provide education and explanation on how to use RPE scale  Provide education and explanation on how to use RPE scale      Expected Outcomes  Long Term:  Able to use RPE to guide intensity level when exercising independently;Short Term: Able to use RPE daily in rehab to express subjective intensity level  Long Term:  Able to use RPE to guide intensity level when exercising independently;Short Term: Able to use RPE daily in rehab to express subjective intensity level      Able to understand and use Dyspnea scale  Yes  Yes      Intervention  Provide education and explanation on how to use Dyspnea scale  Provide education and  explanation on how to use Dyspnea scale      Expected Outcomes  Long Term: Able to use Dyspnea scale to guide intensity level when exercising independently;Short Term: Able to use Dyspnea scale daily in rehab to express subjective sense of shortness of breath during exertion  Long Term: Able to use Dyspnea scale to guide intensity level when exercising independently;Short Term: Able to use Dyspnea scale daily in rehab to express subjective sense of shortness of breath during exertion      Knowledge and understanding of Target Heart Rate Range (THRR)  Yes  Yes      Intervention  --  Provide education and explanation of THRR including how the numbers were predicted and where they are located for reference      Expected Outcomes  Short Term: Able to state/look up THRR;Short Term: Able to use daily as guideline for intensity in rehab;Long Term: Able to use THRR to govern intensity when exercising independently  Short Term: Able to state/look up THRR;Short Term: Able to use daily as guideline for intensity in rehab;Long Term: Able to use THRR to govern intensity when exercising independently      Understanding of Exercise Prescription  Yes  Yes      Intervention  Provide education, explanation, and written materials on patient's individual exercise prescription  Provide education, explanation, and written materials on patient's individual exercise prescription      Expected Outcomes  Short Term: Able to explain program exercise prescription;Long Term: Able to explain home exercise prescription to exercise independently  Short Term: Able to explain program exercise prescription;Long Term: Able to explain home exercise prescription to exercise independently         Exercise Goals Re-Evaluation: Exercise Goals Re-Evaluation    Row Name 05/13/19 1516             Exercise Goal Re-Evaluation   Exercise Goals Review  Increase Physical Activity;Increase Strength and Stamina;Able to understand and use rate of  perceived exertion (RPE) scale;Able to understand and use Dyspnea scale;Knowledge and understanding of Target Heart Rate Range (THRR);Understanding of Exercise Prescription       Comments  Pt has completed 1 exercise session. Pt has voiced his desire to use TM in program, as he uses it at home. Advised pt against using TM and will not put pt on TM in program due to my concerns of his  balance. Pt exercised at 2.5 METs on the rec bike.Will continue to monitor and progress as able.       Expected Outcomes  Through exercise at rehab and at home, the patient will decrease shortness of breath with daily activities and feel confident in carrying out an exercise regime at home.          Nutrition & Weight - Outcomes: Pre Biometrics - 05/06/19 1500      Pre Biometrics   Grip Strength  24 kg        Nutrition:   Nutrition Discharge:   Education Questionnaire Score: Knowledge Questionnaire Score - 05/13/19 1505      Knowledge Questionnaire Score   Pre Score  14/18       Goals reviewed with patient; copy given to patient.

## 2019-06-10 NOTE — Addendum Note (Signed)
Encounter addended by: Drema Pry, RN on: 06/10/2019 9:43 AM  Actions taken: Clinical Note Signed

## 2019-06-12 ENCOUNTER — Encounter (HOSPITAL_COMMUNITY): Payer: Medicare Other

## 2019-06-17 ENCOUNTER — Encounter (HOSPITAL_COMMUNITY): Payer: Medicare Other

## 2019-06-17 ENCOUNTER — Ambulatory Visit: Payer: Medicare Other | Attending: Internal Medicine

## 2019-06-17 DIAGNOSIS — Z23 Encounter for immunization: Secondary | ICD-10-CM

## 2019-06-17 NOTE — Progress Notes (Signed)
   Covid-19 Vaccination Clinic  Name:  Timothy Spencer    MRN: [id number] DOB: Aug 12, 1940  06/17/2019  Timothy Spencer was observed post Covid-19 immunization for 15 minutes without incidence. He was provided with Vaccine Information Sheet and instruction to access the V-Safe system.   Timothy Spencer was instructed to call 911 with any severe reactions post vaccine: Marland Kitchen Difficulty breathing  . Swelling of your face and throat  . A fast heartbeat  . A bad rash all over your body  . Dizziness and weakness    Immunizations Administered    Name Date Dose VIS Date Route   Pfizer COVID-19 Vaccine 06/17/2019  4:01 PM 0.3 mL 05/09/2019 Intramuscular   Manufacturer: [employer], [employer]   Lot: [id number]   NDC: [address]-6

## 2019-06-19 ENCOUNTER — Encounter (HOSPITAL_COMMUNITY): Payer: Medicare Other

## 2019-06-24 ENCOUNTER — Encounter (HOSPITAL_COMMUNITY): Payer: Medicare Other

## 2019-06-26 ENCOUNTER — Encounter (HOSPITAL_COMMUNITY): Payer: Medicare Other

## 2019-06-27 ENCOUNTER — Ambulatory Visit: Payer: Medicare Other

## 2019-07-01 ENCOUNTER — Encounter (HOSPITAL_COMMUNITY): Payer: Medicare Other

## 2019-07-02 ENCOUNTER — Other Ambulatory Visit: Payer: Self-pay | Admitting: Cardiovascular Disease

## 2019-07-03 ENCOUNTER — Encounter (HOSPITAL_COMMUNITY): Payer: Medicare Other

## 2019-07-07 ENCOUNTER — Ambulatory Visit: Payer: Medicare Other | Attending: Internal Medicine

## 2019-07-07 DIAGNOSIS — H353221 Exudative age-related macular degeneration, left eye, with active choroidal neovascularization: Secondary | ICD-10-CM | POA: Diagnosis not present

## 2019-07-07 DIAGNOSIS — Z23 Encounter for immunization: Secondary | ICD-10-CM

## 2019-07-07 DIAGNOSIS — H43813 Vitreous degeneration, bilateral: Secondary | ICD-10-CM | POA: Diagnosis not present

## 2019-07-07 DIAGNOSIS — H353114 Nonexudative age-related macular degeneration, right eye, advanced atrophic with subfoveal involvement: Secondary | ICD-10-CM | POA: Diagnosis not present

## 2019-07-07 DIAGNOSIS — H35373 Puckering of macula, bilateral: Secondary | ICD-10-CM | POA: Diagnosis not present

## 2019-07-07 NOTE — Progress Notes (Signed)
   Covid-19 Vaccination Clinic  Name:  Timothy Spencer    MRN: [id number] DOB: 04/23/1941  07/07/2019  Timothy Spencer was observed post Covid-19 immunization for 15 minutes without incidence. He was provided with Vaccine Information Sheet and instruction to access the V-Safe system.   Timothy Spencer was instructed to call 911 with any severe reactions post vaccine: Marland Kitchen Difficulty breathing  . Swelling of your face and throat  . A fast heartbeat  . A bad rash all over your body  . Dizziness and weakness    Immunizations Administered    Name Date Dose VIS Date Route   Pfizer COVID-19 Vaccine 07/07/2019 10:10 AM 0.3 mL 05/09/2019 Intramuscular   Manufacturer: [employer], [employer]   Lot: [id number]   NDC: [address]-2

## 2019-07-08 ENCOUNTER — Encounter (HOSPITAL_COMMUNITY): Payer: Medicare Other

## 2019-07-10 ENCOUNTER — Encounter (HOSPITAL_COMMUNITY): Payer: Medicare Other

## 2019-07-19 DIAGNOSIS — H353221 Exudative age-related macular degeneration, left eye, with active choroidal neovascularization: Secondary | ICD-10-CM | POA: Diagnosis not present

## 2019-07-19 DIAGNOSIS — H4311 Vitreous hemorrhage, right eye: Secondary | ICD-10-CM | POA: Diagnosis not present

## 2019-07-19 DIAGNOSIS — H353114 Nonexudative age-related macular degeneration, right eye, advanced atrophic with subfoveal involvement: Secondary | ICD-10-CM | POA: Diagnosis not present

## 2019-07-19 DIAGNOSIS — H43811 Vitreous degeneration, right eye: Secondary | ICD-10-CM | POA: Diagnosis not present

## 2019-07-24 DIAGNOSIS — H43813 Vitreous degeneration, bilateral: Secondary | ICD-10-CM | POA: Diagnosis not present

## 2019-07-24 DIAGNOSIS — H353114 Nonexudative age-related macular degeneration, right eye, advanced atrophic with subfoveal involvement: Secondary | ICD-10-CM | POA: Diagnosis not present

## 2019-07-24 DIAGNOSIS — H353221 Exudative age-related macular degeneration, left eye, with active choroidal neovascularization: Secondary | ICD-10-CM | POA: Diagnosis not present

## 2019-07-24 DIAGNOSIS — H4311 Vitreous hemorrhage, right eye: Secondary | ICD-10-CM | POA: Diagnosis not present

## 2019-07-29 ENCOUNTER — Ambulatory Visit: Payer: Medicare Other | Admitting: Cardiovascular Disease

## 2019-07-29 ENCOUNTER — Other Ambulatory Visit: Payer: Self-pay

## 2019-07-31 DIAGNOSIS — L218 Other seborrheic dermatitis: Secondary | ICD-10-CM | POA: Diagnosis not present

## 2019-07-31 DIAGNOSIS — L570 Actinic keratosis: Secondary | ICD-10-CM | POA: Diagnosis not present

## 2019-08-04 ENCOUNTER — Encounter (HOSPITAL_COMMUNITY): Payer: Self-pay

## 2019-08-04 ENCOUNTER — Emergency Department (HOSPITAL_COMMUNITY)
Admission: EM | Admit: 2019-08-04 | Discharge: 2019-08-05 | Disposition: A | Discharge status: Home / Self Care | Payer: Medicare Other | Attending: Emergency Medicine | Admitting: Emergency Medicine

## 2019-08-04 ENCOUNTER — Ambulatory Visit (HOSPITAL_COMMUNITY)
Admission: EM | Admit: 2019-08-04 | Discharge: 2019-08-04 | Disposition: A | Discharge status: Other Acute Inpatient Hospital | Payer: Medicare Other | Source: Home / Self Care

## 2019-08-04 ENCOUNTER — Encounter (HOSPITAL_COMMUNITY): Payer: Self-pay | Admitting: Emergency Medicine

## 2019-08-04 ENCOUNTER — Other Ambulatory Visit: Payer: Self-pay

## 2019-08-04 DIAGNOSIS — I10 Essential (primary) hypertension: Secondary | ICD-10-CM | POA: Diagnosis not present

## 2019-08-04 DIAGNOSIS — J449 Chronic obstructive pulmonary disease, unspecified: Secondary | ICD-10-CM | POA: Insufficient documentation

## 2019-08-04 DIAGNOSIS — Z87891 Personal history of nicotine dependence: Secondary | ICD-10-CM | POA: Diagnosis not present

## 2019-08-04 DIAGNOSIS — E039 Hypothyroidism, unspecified: Secondary | ICD-10-CM | POA: Insufficient documentation

## 2019-08-04 DIAGNOSIS — I2510 Atherosclerotic heart disease of native coronary artery without angina pectoris: Secondary | ICD-10-CM | POA: Diagnosis not present

## 2019-08-04 DIAGNOSIS — R1031 Right lower quadrant pain: Secondary | ICD-10-CM

## 2019-08-04 DIAGNOSIS — Z79899 Other long term (current) drug therapy: Secondary | ICD-10-CM | POA: Insufficient documentation

## 2019-08-04 DIAGNOSIS — K5792 Diverticulitis of intestine, part unspecified, without perforation or abscess without bleeding: Secondary | ICD-10-CM | POA: Insufficient documentation

## 2019-08-04 DIAGNOSIS — Z7982 Long term (current) use of aspirin: Secondary | ICD-10-CM | POA: Insufficient documentation

## 2019-08-04 LAB — COMPREHENSIVE METABOLIC PANEL
ALT: 17 U/L (ref 0–44)
AST: 27 U/L (ref 15–41)
Albumin: 3.9 g/dL (ref 3.5–5.0)
Alkaline Phosphatase: 91 U/L (ref 38–126)
Anion gap: 8 (ref 5–15)
BUN: 23 mg/dL (ref 8–23)
CO2: 25 mmol/L (ref 22–32)
Calcium: 9.4 mg/dL (ref 8.9–10.3)
Chloride: 108 mmol/L (ref 98–111)
Creatinine, Ser: 1.38 mg/dL — ABNORMAL HIGH (ref 0.61–1.24)
GFR calc Af Amer: 56 mL/min — ABNORMAL LOW (ref >60)
GFR calc non Af Amer: 48 mL/min — ABNORMAL LOW (ref >60)
Glucose, Bld: 93 mg/dL (ref 70–99)
Potassium: 4.1 mmol/L (ref 3.5–5.1)
Sodium: 141 mmol/L (ref 135–145)
Total Bilirubin: 1.3 mg/dL — ABNORMAL HIGH (ref 0.3–1.2)
Total Protein: 6.5 g/dL (ref 6.5–8.1)

## 2019-08-04 LAB — URINALYSIS, ROUTINE W REFLEX MICROSCOPIC
Bilirubin Urine: NEGATIVE
Glucose, UA: NEGATIVE mg/dL
Hgb urine dipstick: NEGATIVE
Ketones, ur: NEGATIVE mg/dL
Leukocytes,Ua: NEGATIVE
Nitrite: NEGATIVE
Protein, ur: NEGATIVE mg/dL
Specific Gravity, Urine: 1.02 (ref 1.005–1.030)
pH: 5 (ref 5.0–8.0)

## 2019-08-04 LAB — LIPASE, BLOOD: Lipase: 47 U/L (ref 11–51)

## 2019-08-04 LAB — CBC
HCT: 43.4 % (ref 39.0–52.0)
Hemoglobin: 14.7 g/dL (ref 13.0–17.0)
MCH: 32.5 pg (ref 26.0–34.0)
MCHC: 33.9 g/dL (ref 30.0–36.0)
MCV: 95.8 fL (ref 80.0–100.0)
Platelets: 119 10*3/uL — ABNORMAL LOW (ref 150–400)
RBC: 4.53 MIL/uL (ref 4.22–5.81)
RDW: 13.7 % (ref 11.5–15.5)
WBC: 9.2 10*3/uL (ref 4.0–10.5)
nRBC: 0 % (ref 0.0–0.2)

## 2019-08-04 MED ORDER — ACETAMINOPHEN 325 MG PO TABS
ORAL_TABLET | ORAL | Status: AC
  Filled 2019-08-04: qty 2

## 2019-08-04 MED ORDER — ACETAMINOPHEN 325 MG PO TABS
650.0000 mg | ORAL_TABLET | Freq: Once | ORAL | Status: AC
  Administered 2019-08-04: 650 mg via ORAL

## 2019-08-04 NOTE — ED Triage Notes (Signed)
Pt reports sudden onset RLQ pain since yesterday, denies n/v/d. Pt sent from UC to r.o appendicitis.

## 2019-08-04 NOTE — ED Provider Notes (Signed)
Timothy Spencer   MRN: [id number] DOB: 03-03-1941  Subjective:   Timothy Spencer is a 79 y.o. male presenting for 2-day history of acute onset worsening constant right lower quadrant abdominal pain.  Patient states that the symptoms are moderate at rest and if he does not take a deep breath.  Patient has intermittent severe sharp pains when he takes a deep breath or starts to move.  Has not taken anything for his pain today.  Denies history of diverticulitis but has been told that he has diverticulosis.  He has never had his appendix out.  Denies fever, nausea, vomiting, dysuria, hematuria, constipation.  Denies any recent surgeries.  No current facility-administered medications for this encounter.  Current Outpatient Medications:  .  aspirin EC 81 MG tablet, Take 81 mg by mouth daily., Disp: , Rfl:  .  cholecalciferol (VITAMIN D) 1000 units tablet, Take 1,000 Units by mouth daily., Disp: , Rfl:  .  Fluticasone-Umeclidin-Vilant (TRELEGY ELLIPTA) 100-62.5-25 MCG/INH AEPB, Inhale 1 puff into the lungs daily., Disp: 60 each, Rfl: 5 .  isosorbide mononitrate (IMDUR) 60 MG 24 hr tablet, Take 1.5 tablets (90 mg total) by mouth daily. TAKE 1 AND 1/2 TABLETS(90 MG) BY MOUTH DAILY, Disp: 135 tablet, Rfl: 3 .  levothyroxine (SYNTHROID, LEVOTHROID) 175 MCG tablet, Take 1 tablet by mouth daily., Disp: , Rfl: 5 .  pravastatin (PRAVACHOL) 20 MG tablet, Take 1 tablet (20 mg total) by mouth every evening., Disp: 30 tablet, Rfl: 5 .  ranitidine (ZANTAC) 150 MG tablet, Take 150 mg by mouth 2 (two) times daily., Disp: , Rfl:  .  ranolazine (RANEXA) 500 MG 12 hr tablet, Take 500 mg by mouth daily. , Disp: , Rfl:  .  rOPINIRole (REQUIP) 1 MG tablet, Take 1 mg by mouth 2 (two) times daily. , Disp: , Rfl:  .  sertraline (ZOLOFT) 100 MG tablet, Take 100 mg by mouth daily. , Disp: , Rfl:  .  tamsulosin (FLOMAX) 0.4 MG CAPS capsule, Take 0.4 mg by mouth daily. , Disp: , Rfl:  .  traMADol (ULTRAM) 50 MG tablet, 1  TABLET BY MOUTH DAILY, Disp: , Rfl: 0   No Known Allergies  Past Medical History:  Diagnosis Date  . Anxiety and depression   . BPH (benign prostatic hyperplasia)   . CAD (coronary artery disease)   . COPD (chronic obstructive pulmonary disease) ([place])   . DOE (dyspnea on exertion)   . ED (erectile dysfunction)   . Emphysema of lung ([place])   . First degree AV block   . GERD (gastroesophageal reflux disease)   . Glaucoma   . Gout   . Gynecomastia   . H/O ETOH abuse   . Hemochromatosis   . Hyperlipidemia   . Hypertension   . Hypothyroidism   . Macular degeneration   . OSA (obstructive sleep apnea)   . Osteopenia   . Peripheral neuropathy   . RLS (restless legs syndrome)   . Thrombocytopenia ([place])      Past Surgical History:  Procedure Laterality Date  . CARDIAC CATHETERIZATION  03/27/2008   Medical therapy  . CARDIAC CATHETERIZATION  06/03/2010   Medical theray and smoking cessation  . CARDIAC CATHETERIZATION N/A 11/24/2014   Procedure: Left Heart Cath and Coronary Angiography;  Surgeon: Troy Sine, MD;  Location: [facility];  Service: Cardiovascular;  Laterality: N/A;  . CARDIOPULMONARY EXERCISE TEST  02/12/2012   Peak VO2 63% of predicted  . ORIF FEMUR FRACTURE Right   .  TRANSTHORACIC ECHOCARDIOGRAM  11/28/2011   EF >55%, normal    Family History  Adopted: Yes    Social History   Tobacco Use  . Smoking status: Former Smoker    Packs/day: 2.00    Years: 50.00    Pack years: 100.00    Types: Cigarettes    Start date: 04/11/1960    Quit date: 03/19/2011    Years since quitting: 8.3  . Smokeless tobacco: Never Used  . Tobacco comment: Quit for 1 year total   Substance Use Topics  . Alcohol use: No    Alcohol/week: 0.0 standard drinks  . Drug use: No    ROS   Objective:   Vitals: BP (!) 169/82 (BP Location: Right Arm)   Pulse 98   Temp 98.2 F (36.8 C) (Oral)   Resp 20   SpO2 96%   Physical Exam Constitutional:      General: He is not  in acute distress.    Appearance: Normal appearance. He is well-developed. He is not ill-appearing, toxic-appearing or diaphoretic.  HENT:     Head: Normocephalic and atraumatic.     Right Ear: External ear normal.     Left Ear: External ear normal.     Nose: Nose normal.     Mouth/Throat:     Mouth: Mucous membranes are moist.     Pharynx: Oropharynx is clear.  Eyes:     General: No scleral icterus.    Extraocular Movements: Extraocular movements intact.     Pupils: Pupils are equal, round, and reactive to light.  Cardiovascular:     Rate and Rhythm: Normal rate and regular rhythm.     Heart sounds: Normal heart sounds. No murmur. No friction rub. No gallop.   Pulmonary:     Effort: Pulmonary effort is normal. No respiratory distress.     Breath sounds: Normal breath sounds. No stridor. No wheezing, rhonchi or rales.  Abdominal:     General: Bowel sounds are normal. There is no distension.     Palpations: Abdomen is soft. There is no mass.     Tenderness: There is abdominal tenderness (exquisite) in the right lower quadrant. There is no guarding or rebound.  Skin:    General: Skin is warm and dry.  Neurological:     Mental Status: He is alert and oriented to person, place, and time.  Psychiatric:        Mood and Affect: Mood normal.        Behavior: Behavior normal.        Thought Content: Thought content normal.      Assessment and Plan :   1. Right lower quadrant abdominal pain     Patient needs CT abdomen pelvis to r/o acute abdomen, diverticulitis, appendicitis. Redirected patient to the ER after giving him APAP. Patient's wife contracts for safety and will take him there now.    Wallis Bamberg, [place] 08/04/19 3016

## 2019-08-04 NOTE — ED Triage Notes (Signed)
Pt sts RLQ abd pain x 2 days worse with movement and inspiration; denies other sx

## 2019-08-04 NOTE — Discharge Instructions (Addendum)
You have severe abdominal pain that warrants CT abdomen/pelvis scan in the emergency room.  Unfortunately we cannot rule out an acute abdomen or other emergent process such as diverticulitis and even appendicitis.  Please make sure that you had there now.  You will be evaluated by a different team of providers who will likely obtain blood work and imaging.

## 2019-08-04 NOTE — ED Provider Notes (Signed)
[facility] EMERGENCY DEPARTMENT Provider Note   CSN: [id number] Arrival date & time: 08/04/19  1804     History Chief Complaint  Patient presents with  . Abdominal Pain    Timothy Spencer is a 79 y.o. male.  The history is provided by the patient and medical records.    79 year old male with history of anxiety, BPH, COPD, CAD, GERD, hyperlipidemia, hypertension, hypothyroidism, restless legs, chronic thrombocytopenia, presenting to the ED with abdominal pain.  States yesterday morning he began having pain in his right lower abdomen, has been somewhat worsening since onset.  He was seen at urgent care earlier today and sent here for further evaluation.  Pain has not really moved or changed in character.  He denies any radiation of the pain.  He has been having regular bowel movements, however stools are little more firm than normal.  No issues with urination.  No fever or chills.  No nausea or vomiting, has been eating and drinking well.  No prior abdominal surgeries.  Past Medical History:  Diagnosis Date  . Anxiety and depression   . BPH (benign prostatic hyperplasia)   . CAD (coronary artery disease)   . COPD (chronic obstructive pulmonary disease) (HCC)   . DOE (dyspnea on exertion)   . ED (erectile dysfunction)   . Emphysema of lung (HCC)   . First degree AV block   . GERD (gastroesophageal reflux disease)   . Glaucoma   . Gout   . Gynecomastia   . H/O ETOH abuse   . Hemochromatosis   . Hyperlipidemia   . Hypertension   . Hypothyroidism   . Macular degeneration   . OSA (obstructive sleep apnea)   . Osteopenia   . Peripheral neuropathy   . RLS (restless legs syndrome)   . Thrombocytopenia [facility])     Patient Active Problem List   Diagnosis Date Noted  . Alpha-1-antitrypsin 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 04/11/2017  . Lung nodule < 6cm on CT 03/06/2017  . COPD (chronic obstructive pulmonary disease) (HCC) 08/17/2015  . Personal history of tobacco use,  presenting hazards to health 06/28/2015  . 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  . Essential hypertension 06/18/2015  . CAD in native artery   . Exertional dyspnea 11/21/2014  . Right bundle branch block 07/17/2014  . First degree AV block 07/17/2014  . CAD (coronary artery disease) 07/17/2014  . Fatigue 03/18/2013  . Hypothyroid 03/18/2013  . Hyperlipidemia with target LDL less than 70 03/18/2013    Past Surgical History:  Procedure Laterality Date  . CARDIAC CATHETERIZATION  03/27/2008   Medical therapy  . CARDIAC CATHETERIZATION  06/03/2010   Medical theray and smoking cessation  . CARDIAC CATHETERIZATION N/A 11/24/2014   Procedure: Left Heart Cath and Coronary Angiography;  Surgeon: Lennette Bihari, MD;  Location: [facility] INVASIVE CV LAB;  Service: Cardiovascular;  Laterality: N/A;  . CARDIOPULMONARY EXERCISE TEST  02/12/2012   Peak VO2 63% of predicted  . ORIF FEMUR FRACTURE Right   . TRANSTHORACIC ECHOCARDIOGRAM  11/28/2011   EF >55%, normal       Family History  Adopted: Yes    Social History   Tobacco Use  . Smoking status: Former Smoker    Packs/day: 2.00    Years: 50.00    Pack years: 100.00    Types: Cigarettes    Start date: 04/11/1960    Quit date: 03/19/2011    Years since quitting: 8.3  . Smokeless tobacco: Never Used  . Tobacco comment: Quit for  1 year total   Substance Use Topics  . Alcohol use: No    Alcohol/week: 0.0 standard drinks  . Drug use: No    Home Medications Prior to Admission medications   Medication Sig Start Date End Date Taking? Authorizing Provider  aspirin EC 81 MG tablet Take 81 mg by mouth daily.   Yes [provider]  Fluticasone-Umeclidin-Vilant (TRELEGY ELLIPTA) 100-62.5-25 MCG/INH AEPB Inhale 1 puff into the lungs daily. 06/09/19  Yes Mannam, Praveen, MD  isosorbide mononitrate (IMDUR) 60 MG 24 hr tablet Take 1.5 tablets (90 mg total) by mouth daily. TAKE 1 AND 1/2 TABLETS(90 MG) BY MOUTH DAILY 07/03/19  Yes Lennette Bihari,  MD  levothyroxine (SYNTHROID) 112 MCG tablet Take 112 mcg by mouth daily. 07/29/19  Yes [provider]  ranitidine (ZANTAC) 150 MG tablet Take 150 mg by mouth 2 (two) times daily.   Yes [provider]  rOPINIRole (REQUIP) 1 MG tablet Take 1 mg by mouth 2 (two) times daily.  01/01/13  Yes [provider]  rosuvastatin (CRESTOR) 5 MG tablet Take 5 mg by mouth every other day. 07/15/19  Yes [provider]  sertraline (ZOLOFT) 100 MG tablet Take 100 mg by mouth daily.  01/25/13  Yes [provider]  tamsulosin (FLOMAX) 0.4 MG CAPS capsule Take 0.4 mg by mouth daily.  01/31/13  Yes [provider]  pravastatin (PRAVACHOL) 20 MG tablet Take 1 tablet (20 mg total) by mouth every evening. Patient not taking: Reported on 08/04/2019 02/01/17 08/03/28  Azalee Course, PA    Allergies    Patient has no known allergies.  Review of Systems   Review of Systems  Gastrointestinal: Positive for abdominal pain.  All other systems reviewed and are negative.   Physical Exam Updated Vital Signs BP (!) 198/97   Pulse 96   Temp 98.1 F (36.7 C) (Oral)   Resp 18   SpO2 98%   Physical Exam Vitals and nursing note reviewed.  Constitutional:      Appearance: He is well-developed.  HENT:     Head: Normocephalic and atraumatic.  Eyes:     Conjunctiva/sclera: Conjunctivae normal.     Pupils: Pupils are equal, round, and reactive to light.  Cardiovascular:     Rate and Rhythm: Normal rate and regular rhythm.     Heart sounds: Normal heart sounds.  Pulmonary:     Effort: Pulmonary effort is normal.     Breath sounds: Normal breath sounds.  Abdominal:     General: Bowel sounds are normal.     Palpations: Abdomen is soft.     Tenderness: There is abdominal tenderness in the right lower quadrant.  Musculoskeletal:        General: Normal range of motion.     Cervical back: Normal range of motion.  Skin:    General: Skin is warm and dry.  Neurological:     Mental  Status: He is alert and oriented to person, place, and time.     ED Results / Procedures / Treatments   Labs (all labs ordered are listed, but only abnormal results are displayed) Labs Reviewed  COMPREHENSIVE METABOLIC PANEL - Abnormal; Notable for the following components:      Result Value   Creatinine, Ser 1.38 (*)    Total Bilirubin 1.3 (*)    GFR calc non Af Amer 48 (*)    GFR calc Af Amer 56 (*)    All other components within normal limits  CBC -  Abnormal; Notable for the following components:   Platelets 119 (*)    All other components within normal limits  LIPASE, BLOOD  URINALYSIS, ROUTINE W REFLEX MICROSCOPIC    EKG None  Radiology CT ABDOMEN PELVIS W CONTRAST  Result Date: 08/05/2019 CLINICAL DATA:  Right lower quadrant pain. EXAM: CT ABDOMEN AND PELVIS WITH CONTRAST TECHNIQUE: Multidetector CT imaging of the abdomen and pelvis was performed using the standard protocol following bolus administration of intravenous contrast. CONTRAST:  116mL OMNIPAQUE IOHEXOL 300 MG/ML  SOLN COMPARISON:  None. FINDINGS: Lower chest: There is atelectasis at the right lung base.The heart size is normal. Bilateral gynecomastia is noted. Hepatobiliary: The liver is normal. Cholelithiasis without acute inflammation.There is no biliary ductal dilation. Pancreas: Normal contours without ductal dilatation. No peripancreatic fluid collection. Spleen: No splenic laceration or hematoma. Adrenals/Urinary Tract: --Adrenal glands: No adrenal hemorrhage. --Right kidney/ureter: There is a probable nonobstructing stone in the upper pole the right kidney. --Left kidney/ureter: No hydronephrosis or perinephric hematoma. --Urinary bladder: The urinary bladder is significantly distended. Stomach/Bowel: --Stomach/Duodenum: No hiatal hernia or other gastric abnormality. Normal duodenal course and caliber. --Small bowel: No dilatation or inflammation. --Colon: There is uncomplicated sigmoid diverticulitis. There is no  free air. No abscess. --Appendix: Normal. Vascular/Lymphatic: Atherosclerotic calcification is present within the non-aneurysmal abdominal aorta, without hemodynamically significant stenosis. --No retroperitoneal lymphadenopathy. --No mesenteric lymphadenopathy. --No pelvic or inguinal lymphadenopathy. Reproductive: Unremarkable Other: No ascites or free air. The abdominal wall is normal. Musculoskeletal. There are degenerative changes throughout the visualized lumbar spine and hips. There is diffuse osteopenia. IMPRESSION: 1. Uncomplicated sigmoid diverticulitis. 2. Cholelithiasis without acute cholecystitis. 3. Normal appendix. 4. Significantly distended urinary bladder. 5. Aortic Atherosclerosis (ICD10-I70.0). Electronically Signed   By: Constance Holster M.D.   On: 08/05/2019 00:58    Procedures Procedures (including critical care time)  Medications Ordered in ED Medications - No data to display  ED Course  I have reviewed the triage vital signs and the nursing notes.  Pertinent labs & imaging results that were available during my care of the patient were reviewed by me and considered in my medical decision making (see chart for details).    MDM Rules/Calculators/A&P  79 year old male presenting to the ED from urgent care for further evaluation of right lower abdominal pain.  This began yesterday morning and has been somewhat worsening since onset.  Does report change in stool caliber but no diarrhea, nausea, or vomiting.  He is afebrile and nontoxic.  Does have some mild tenderness in the right lower quadrant without peritoneal signs.  He screening labs are overall reassuring, no leukocytosis.  UA without signs of infection.  CT scan was obtained, uncomplicated diverticulitis.  Normal appendix.  Also has gallstones without findings of cholecystitis.  Patient has remained afebrile and nontoxic in appearance here.  He has not required any formal pain medication.  He did request dose of his  nighttime Requip due to his restless legs which was ordered.  I have discussed with him options of inpatient versus outpatient management of his acute diverticulitis along with risks and benefits of both.  He would prefer to go home as he states he will be more comfortable there and he would like to be with his wife.  Will start on course of Augmentin and Norco for pain, advised he may wish to use a probiotic to counteract any possible diarrhea.  He will need to monitor his symptoms closely and follow-up with primary care doctor.  He should return here immediately  for any acute change in pain, inability to tolerate oral medications, high fever, etc.  Patient acknowledged understanding.  Final Clinical Impression(s) / ED Diagnoses Final diagnoses:  Diverticulitis    Rx / DC Orders ED Discharge Orders         Ordered    amoxicillin-clavulanate (AUGMENTIN) 875-125 MG tablet  Every 12 hours     08/05/19 0236    HYDROcodone-acetaminophen (NORCO/VICODIN) 5-325 MG tablet  Every 4 hours PRN     08/05/19 0236           Garlon Hatchet, PA-C 08/05/19 [id number]    Maia Plan, MD 08/05/19 [phone]

## 2019-08-04 NOTE — ED Notes (Signed)
Patient is being discharged from the Urgent Care Center and sent to the Emergency Department via POV. Per MM, patient is stable but in need of higher level of care due to RLQ abd pain. Patient is aware and verbalizes understanding of plan of care.  Vitals:   08/04/19 1736  BP: (!) 169/82  Pulse: 98  Resp: 20  Temp: 98.2 F (36.8 C)  SpO2: 96%

## 2019-08-05 ENCOUNTER — Emergency Department (HOSPITAL_COMMUNITY): Payer: Medicare Other

## 2019-08-05 DIAGNOSIS — R1031 Right lower quadrant pain: Secondary | ICD-10-CM | POA: Diagnosis not present

## 2019-08-05 MED ORDER — IOHEXOL 300 MG/ML  SOLN
100.0000 mL | Freq: Once | INTRAMUSCULAR | Status: AC | PRN
  Administered 2019-08-05: 100 mL via INTRAVENOUS

## 2019-08-05 MED ORDER — AMOXICILLIN-POT CLAVULANATE 875-125 MG PO TABS: 1.0000 | ORAL_TABLET | Freq: Two times a day (BID) | ORAL | 0 refills | Status: DC

## 2019-08-05 MED ORDER — HYDROCODONE-ACETAMINOPHEN 5-325 MG PO TABS: 1.0000 | ORAL_TABLET | ORAL | 0 refills | Status: DC | PRN

## 2019-08-05 MED ORDER — AMOXICILLIN-POT CLAVULANATE 875-125 MG PO TABS
1.0000 | ORAL_TABLET | Freq: Once | ORAL | Status: AC
  Administered 2019-08-05: 1 via ORAL
  Filled 2019-08-05: qty 1

## 2019-08-05 MED ORDER — ROPINIROLE HCL 1 MG PO TABS
1.0000 mg | ORAL_TABLET | Freq: Once | ORAL | Status: DC
  Filled 2019-08-05: qty 1

## 2019-08-05 NOTE — Discharge Instructions (Signed)
Take the prescribed medication as directed.  Do not drive or make important decisions while taking pain medication. May wish to take probiotic while taking antibiotics to avoid diarrhea. Follow-up with your primary care doctor. Return to the ED for new or worsening symptoms-- fever, unable to hold down oral medications, severe/worsening pain, etc.

## 2019-08-05 NOTE — ED Notes (Signed)
Patient verbalizes understanding of discharge instructions. Opportunity for questioning and answers were provided. Armband removed by staff, pt discharged from ED. Pt. ambulatory and discharged home.  

## 2019-08-06 DIAGNOSIS — H353114 Nonexudative age-related macular degeneration, right eye, advanced atrophic with subfoveal involvement: Secondary | ICD-10-CM | POA: Diagnosis not present

## 2019-08-06 DIAGNOSIS — H4311 Vitreous hemorrhage, right eye: Secondary | ICD-10-CM | POA: Diagnosis not present

## 2019-08-06 DIAGNOSIS — H43811 Vitreous degeneration, right eye: Secondary | ICD-10-CM | POA: Diagnosis not present

## 2019-08-07 ENCOUNTER — Telehealth: Payer: Self-pay | Admitting: Cardiovascular Disease

## 2019-08-07 NOTE — Telephone Encounter (Signed)
Gerardo states he is returning a phone call. I was unable to find any documentation regarding it. Please advise.

## 2019-08-07 NOTE — Telephone Encounter (Signed)
Left detailed message that it did not appear this office had called Encouraged pt to listen to message again and see if  can confirm which office called .Zack Seal

## 2019-08-19 ENCOUNTER — Encounter: Payer: Self-pay | Admitting: Cardiovascular Disease

## 2019-09-02 ENCOUNTER — Telehealth: Payer: Self-pay

## 2019-09-02 ENCOUNTER — Telehealth: Payer: Medicare Other | Admitting: Cardiovascular Disease

## 2019-09-02 ENCOUNTER — Encounter: Payer: Self-pay | Admitting: Cardiovascular Disease

## 2019-09-02 VITALS — Ht 72.0 in | Wt 170.0 lb

## 2019-09-02 DIAGNOSIS — Z79899 Other long term (current) drug therapy: Secondary | ICD-10-CM

## 2019-09-02 DIAGNOSIS — E039 Hypothyroidism, unspecified: Secondary | ICD-10-CM

## 2019-09-02 DIAGNOSIS — I4510 Unspecified right bundle-branch block: Secondary | ICD-10-CM

## 2019-09-02 DIAGNOSIS — I2510 Atherosclerotic heart disease of native coronary artery without angina pectoris: Secondary | ICD-10-CM

## 2019-09-02 DIAGNOSIS — I440 Atrioventricular block, first degree: Secondary | ICD-10-CM

## 2019-09-02 DIAGNOSIS — I10 Essential (primary) hypertension: Secondary | ICD-10-CM | POA: Diagnosis not present

## 2019-09-02 DIAGNOSIS — E785 Hyperlipidemia, unspecified: Secondary | ICD-10-CM

## 2019-09-02 DIAGNOSIS — J439 Emphysema, unspecified: Secondary | ICD-10-CM

## 2019-09-02 NOTE — Telephone Encounter (Signed)
Reviewed avs with pt from vv today with dr.kelly. pt verbalized understanding. No questions or concerns at this time. Will mail lab slip and avs

## 2019-09-02 NOTE — Patient Instructions (Signed)
Medication Instructions:  CONTINUE WITH CURRENT MEDICATIONS. NO CHANGES.  *If you need a refill on your cardiac medications before your next appointment, please call your pharmacy*   Lab Work: ONE WEEK PRIOR TO YOUR OFFICE VISIT: FASTING LABS CMET CBC TSH LIPIDS  If you have labs (blood work) drawn today and your tests are completely normal, you will receive your results only by: Marland Kitchen MyChart Message (if you have MyChart) OR . A paper copy in the mail If you have any lab test that is abnormal or we need to change your treatment, we will call you to review the results.   Follow-Up: YOU HAVE AN OFFICE VISIT WITH DR.KELLY ON 11/17/19 AT 3:40PM

## 2019-09-02 NOTE — Progress Notes (Signed)
Virtual Visit via Telephone Note   This visit type was conducted due to national recommendations for restrictions regarding the COVID-19 Pandemic (e.g. social distancing) in an effort to limit this patient's exposure and mitigate transmission in our community.  Due to his co-morbid illnesses, this patient is at least at moderate risk for complications without adequate follow up.  This format is felt to be most appropriate for this patient at this time.  The patient did not have access to video technology/had technical difficulties with video requiring transitioning to audio format only (telephone).  All issues noted in this document were discussed and addressed.  No physical exam could be performed with this format.  Please refer to the patient's chart for his  consent to telehealth for Timothy Spencer.   The patient was identified using 2 identifiers.  Date:  09/02/2019   ID:  Timothy Spencer, DOB 1940/08/31, MRN [id number]  Patient Location: Home Provider Location: Home  PCP:  Timothy Pretty, MD  Cardiologist:  Timothy Majestic, MD  Electrophysiologist:  None   Evaluation Performed:  Follow-Up Visit  Chief Complaint:  14 month F/U   History of Present Illness:    Timothy Spencer is a 79 y.o. male who has a previous long-standing history of tobacco abuse  but quit smoking in May 2012 after smoking for over 40 years. He had undergone cardiac catheterization initially in 2009 and subsequently in January 2012 which showed mild CAD with mid systolic bridging of his LAD which narrowed up to 95% during systole and was essentially normal during diastole, 50-60% circumflex stenosis, 30-40% mid AV groove stenosis and he had a nondominant right coronary artery. He  benefited with the addition of Ranexa to his medical regimen. A cardiopulmonary met test subsequently suggested an ischemic response. He has been exercising regularly. He no longer smokes cigarettes. He has chronic right bundle branch block.    When I saw him in May 2015  he had been taking off his metoprolol due to continued fatigability. He does have a history of hypothyroidism, and he has been on Synthroid 150 mcg.    Laboratory done by Timothy Spencer in April 2015 showed a hemoglobin of 14.2, hematocrit 42.0.  BUN 2621.5.  Lipid studies were excellent with a total cholesterol 119 triglycerides were slightly elevated at 179, HDL 44, calculated LDL 39.  Urinalysis was negative.   When I last saw him in 2016 he had noticed a significant change in his ability to exercise.  He has noticed a dramatic increase in exertional shortness of breath.  He underwent palmar a function studies by Timothy Spencer which revealed airway obstruction and a diffusion defect suggesting emphysema but the absence of overinflation was inconsistent with that diagnosis.  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  Because of his significant symptom change he underwent definitive catheterization on 11/16/2014 which showed two-vessel disease with 50% mid circumflex stenosis, and there was a 90% mid distal LAD lesion secondary to dynamic systolic bridging which normalized during diastole.  Increased medical therapy was recommended.  He has been evaluated at [facility] and had pulmonary function studies.  He was seen in the office by Timothy Deforest, PA January 2018 with low blood pressure.    He was seen by Timothy Spencer in September 2018 after he was concerned that Crestor was causing increasing dyspnea on exertion.  His dyspnea improved after  he stopped Crestor.  During that evaluation, he initiated pravastatin at 20 mg, which the patient has tolerated and denies any recurrent increasing shortness of breath.  He has emphysema.   I saw him in December 2018 at which time he denied any chest tightness or palpitations.  He now goes to the gym at least 4 days/week.  He denies  chest pain PND orthopnea.  At times he has noticed some slight low blood pressure and mild dizziness if he stands abruptly. He has continued to take isosorbide 90 mg, rosuvastatin 5 mg every other day levothyroxine 175 mcg.  He also is on an oral inhaler.  He is no longer on pravastatin.  He is no longer on Ranexa.  I last saw him on July 24, 2018.  At that time, I recommended he reduce his isosorbide dose to 30 mg.  His ECG showed chronic right bundle branch block with first-degree AV block which was stable.  Over the past year, he has continued to be evaluated by Timothy Spencer for his COPD/emphysema.  He was undergoing pulmonary rehabilitation which was stopped.  He has had issues with low back discomfort making it difficult for him to walk but he states he can get on a treadmill since he holds the railing and is able to tolerate doing this 4 days/week.  He was recently evaluated in the emergency room in early March 2021 with diverticulitis.  Presently his dizziness has resolved with reduction of his isosorbide.  He denies any chest pain or awareness of palpitations.  He still sees Timothy Spencer and has not had recent laboratory.  He continues to be on Trelegy for his lung disease and denies any awareness of significant wheezing.  He denies leg swelling.  He presents for evaluation  The patient does not have symptoms concerning for COVID-19 infection (fever, chills, cough, or new shortness of breath).    Past Medical History:  Diagnosis Date  . Anxiety and depression   . BPH (benign prostatic hyperplasia)   . CAD (coronary artery disease)   . COPD (chronic obstructive pulmonary disease) ([place])   . DOE (dyspnea on exertion)   . ED (erectile dysfunction)   . Emphysema of lung ([place])   . First degree AV block   . GERD (gastroesophageal reflux disease)   . Glaucoma   . Gout   . Gynecomastia   . H/O ETOH abuse   . Hemochromatosis   . Hyperlipidemia   . Hypertension   . Hypothyroidism   . Macular  degeneration   . OSA (obstructive sleep apnea)   . Osteopenia   . Peripheral neuropathy   . RLS (restless legs syndrome)   . Thrombocytopenia ([place])    Past Surgical History:  Procedure Laterality Date  . CARDIAC CATHETERIZATION  03/27/2008   Medical therapy  . CARDIAC CATHETERIZATION  06/03/2010   Medical theray and smoking cessation  . CARDIAC CATHETERIZATION N/A 11/24/2014   Procedure: Left Heart Cath and Coronary Angiography;  Surgeon: Troy Sine, MD;  Location: [place] [facility];  Service: Cardiovascular;  Laterality: N/A;  . CARDIOPULMONARY EXERCISE TEST  02/12/2012   Peak VO2 63% of predicted  . ORIF FEMUR FRACTURE Right   . TRANSTHORACIC ECHOCARDIOGRAM  11/28/2011   EF >55%, normal     Current Meds  Medication Sig  . aspirin EC 81 MG tablet Take 81 mg by mouth daily.  . Fluticasone-Umeclidin-Vilant (TRELEGY ELLIPTA) 100-62.5-25 MCG/INH AEPB Inhale 1 puff into the lungs daily.  . isosorbide  mononitrate (IMDUR) 60 MG 24 hr tablet Take 1.5 tablets (90 mg total) by mouth daily. TAKE 1 AND 1/2 TABLETS(90 MG) BY MOUTH DAILY  . levothyroxine (SYNTHROID) 112 MCG tablet Take 112 mcg by mouth daily.  Marland Kitchen rOPINIRole (REQUIP) 1 MG tablet Take 1 mg by mouth 2 (two) times daily.   . rosuvastatin (CRESTOR) 5 MG tablet Take 5 mg by mouth every other day.  . sertraline (ZOLOFT) 100 MG tablet Take 100 mg by mouth daily.   . tamsulosin (FLOMAX) 0.4 MG CAPS capsule Take 0.4 mg by mouth daily.   . [DISCONTINUED] pravastatin (PRAVACHOL) 20 MG tablet Take 1 tablet (20 mg total) by mouth every evening.     Allergies:   Patient has no known allergies.   Social History   Tobacco Use  . Smoking status: Former Smoker    Packs/day: 2.00    Years: 50.00    Pack years: 100.00    Types: Cigarettes    Start date: 04/11/1960    Quit date: 03/19/2011    Years since quitting: 8.4  . Smokeless tobacco: Never Used  . Tobacco comment: Quit for 1 year total   Substance Use Topics  . Alcohol use: No     Alcohol/week: 0.0 standard drinks  . Drug use: No    Additional social history is notable in that he is married has 2 children. He does walk. He quit smoking in 2012. His alcohol use.  Family Hx: The patient's family history is not on file. He was adopted.  ROS:   Please see the history of present illness.    No fevers chills night sweats No cough, loss of taste or smell COPD/emphysema.  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   Prior CV studies:   The following studies were reviewed today:  12/19/2017 RIGHT CAROTID ARTERY: Moderate heterogeneous partially calcified carotid bifurcation atherosclerosis. Mild luminal narrowing by grayscale imaging. Only slight velocity elevation in the proximal right ICA measuring 161/28 centimeters/second. Degree of stenosis estimated at 50-69%. ECA origin stenosis noted with an elevated velocity as above.  RIGHT VERTEBRAL ARTERY:  Antegrade  LEFT CAROTID ARTERY: Similar moderate partially calcified carotid bifurcation atherosclerosis. Despite this, no hemodynamically significant left ICA stenosis, velocity elevation, or turbulent flow. Degree of narrowing less than 50% by ultrasound criteria.  LEFT VERTEBRAL ARTERY:  Antegrade  IMPRESSION: Moderate bilateral carotid atherosclerosis.  Right ICA stenosis estimated at 50-69%  Left ICA narrowing less than 50%  Patent antegrade vertebral flow bilaterally   Labs/Other Tests and Data Reviewed:    EKG: His last ECG from 08/22/2018 was personally reviewed by me today and showed sinus rhythm at 60 bpm with first-degree AV block, right bundle branch block polarization changes with possible old  inferior infarct.  Parable 254 ms.  QTc interval 440 ms.   December 2018 ECG (independently read by me): Normal sinus rhythm at 60 bpm with first-degree AV block.  Right bundle branch block with repolarization changes.  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  PR interval 254 ms.  QTc interval 440 ms.  May 2018 ECG (independently read by me): Normal sinus rhythm with first-degree AV block at 61 bpm.  PR interval 260 ms.  Right bundle-branch block with repolarization changes.  June 2016 ECG (independently read by  me):  Sinus rhythm with first-degree AV block.  Right bundle-branch block.  PR interval 266 ms. inferior infarct.  Recent Labs: 08/04/2019: ALT 17; BUN 23; Creatinine, Ser 1.38; Hemoglobin 14.7; Platelets 119; Potassium 4.1; Sodium 141   Recent Lipid Panel No results found for: CHOL, TRIG, HDL, CHOLHDL, LDLCALC, LDLDIRECT  Wt Readings from Last 3 Encounters:  09/02/19 170 lb (77.1 kg)  05/20/19 166 lb 7.2 oz (75.5 kg)  05/13/19 166 lb 14.2 oz (75.7 kg)     Objective:    Vital Signs:  Ht 6' (1.829 m)   Wt 170 lb (77.1 kg)   BMI 23.06 kg/m    Since this was a virtual visit I could not physically examine the patient. Breathing was unlabored There was no audible wheezing When he palpated his pulse rhythm was regular without ectopy There was no chest wall pain. He has chronic low back pain making it difficult at times to walk No swelling Normal affect and mood  ASSESSMENT & PLAN:    1. CAD: I personally reviewed his catheterization data from January 2012 and most recent cath of June 2016 which again showed dynamic systolic bridging of his mid LAD intramyocardial segment at 80 to 90% during systole and 0% during diastole; 50% mid AV groove circumflex stenosis and a dominant left circumflex system.  No current angina despite being on reduced dose of isosorbide.  Continues to be on rosuvastatin for hyperlipidemia, remotely had been on pravastatin.  On aspirin. 2. Hyperlipidemia:  Patient is tolerating rosuvastatin.  He has not had any recent lipid panel.  I will recheck complete set of fasting laboratory.  Target LDL less than 70.  If is not on target further dose titration will be necessary. 3. COPD/emphysema: Doing well with Trelegy Ellipta.  Followed by Timothy [facility] at [facility] pulmonary. 4. Hypothyroidism: On levothyroxine. 5. Right bundle branch block/first-degree AV block: No ECG obtained today  COVID-19 Education: The signs and symptoms of COVID-19 were discussed with the patient and how to seek care for testing (follow up with PCP or arrange E-visit).  The importance of social distancing was discussed today.  Time:   Today, I have spent 20 minutes with the patient with telehealth technology discussing the above problems.     Medication Adjustments/Labs and Tests Ordered: Current medicines are reviewed at length with the patient today.  Concerns regarding medicines are outlined above.   Tests Ordered: Orders Placed This Encounter  Procedures  . Comprehensive metabolic panel  . CBC  . TSH  . Lipid panel    Medication Changes: No orders of the defined types were placed in this encounter.   Follow Up: The patient will undergo an in office evaluation in several months and prior to that evaluation fasting laboratory including a comprehensive metabolic panel, CBC, TSH and lipid panel will be obtained.  Signed, Timothy Majestic, MD  09/02/2019 5:55 PM    [place]

## 2019-10-02 DIAGNOSIS — H353221 Exudative age-related macular degeneration, left eye, with active choroidal neovascularization: Secondary | ICD-10-CM | POA: Diagnosis not present

## 2019-10-02 DIAGNOSIS — H353114 Nonexudative age-related macular degeneration, right eye, advanced atrophic with subfoveal involvement: Secondary | ICD-10-CM | POA: Diagnosis not present

## 2019-10-02 DIAGNOSIS — H4311 Vitreous hemorrhage, right eye: Secondary | ICD-10-CM | POA: Diagnosis not present

## 2019-10-02 DIAGNOSIS — H43813 Vitreous degeneration, bilateral: Secondary | ICD-10-CM | POA: Diagnosis not present

## 2019-10-16 DIAGNOSIS — I10 Essential (primary) hypertension: Secondary | ICD-10-CM | POA: Diagnosis not present

## 2019-10-16 DIAGNOSIS — E7800 Pure hypercholesterolemia, unspecified: Secondary | ICD-10-CM | POA: Diagnosis not present

## 2019-10-16 DIAGNOSIS — E039 Hypothyroidism, unspecified: Secondary | ICD-10-CM | POA: Diagnosis not present

## 2019-10-22 DIAGNOSIS — I10 Essential (primary) hypertension: Secondary | ICD-10-CM | POA: Diagnosis not present

## 2019-10-22 DIAGNOSIS — R972 Elevated prostate specific antigen [PSA]: Secondary | ICD-10-CM | POA: Diagnosis not present

## 2019-10-22 DIAGNOSIS — J432 Centrilobular emphysema: Secondary | ICD-10-CM | POA: Diagnosis not present

## 2019-10-22 DIAGNOSIS — K219 Gastro-esophageal reflux disease without esophagitis: Secondary | ICD-10-CM | POA: Diagnosis not present

## 2019-10-29 DIAGNOSIS — H524 Presbyopia: Secondary | ICD-10-CM | POA: Diagnosis not present

## 2019-11-17 ENCOUNTER — Ambulatory Visit: Payer: Medicare Other | Admitting: Cardiovascular Disease

## 2019-11-17 ENCOUNTER — Other Ambulatory Visit: Payer: Self-pay

## 2019-11-17 ENCOUNTER — Encounter: Payer: Self-pay | Admitting: Cardiovascular Disease

## 2019-11-17 VITALS — BP 132/60 | HR 96 | Ht 72.0 in | Wt 173.4 lb

## 2019-11-17 DIAGNOSIS — E785 Hyperlipidemia, unspecified: Secondary | ICD-10-CM

## 2019-11-17 DIAGNOSIS — E039 Hypothyroidism, unspecified: Secondary | ICD-10-CM

## 2019-11-17 DIAGNOSIS — I2510 Atherosclerotic heart disease of native coronary artery without angina pectoris: Secondary | ICD-10-CM | POA: Diagnosis not present

## 2019-11-17 DIAGNOSIS — I4510 Unspecified right bundle-branch block: Secondary | ICD-10-CM

## 2019-11-17 DIAGNOSIS — J439 Emphysema, unspecified: Secondary | ICD-10-CM

## 2019-11-17 NOTE — Patient Instructions (Signed)

## 2019-11-17 NOTE — Progress Notes (Signed)
Patient ID: KAMRYN GAUTHIER, male   DOB: [birth year], 79 y.o.   MRN: [id number]      HPI: ZYHEIR DAFT is a 79 y.o. male who presents for a 10 week month follow-up cardiology evaluation.    Mr. Vandunk has a previous long-standing history of tobacco abuse  but quit smoking in May 2012 after smoking for over 40 years. He had undergone cardiac catheterization initially in 2009 and subsequently in January 2012 which showed mild CAD with mid systolic bridging of his LAD which narrowed up to 95% during systole and was essentially normal during diastole, 50-60% circumflex stenosis, 30-40% mid AV groove stenosis and he had a nondominant right coronary artery. He  benefited with the addition of Ranexa to his medical regimen. A cardiopulmonary met test subsequently suggested an ischemic response. He has been exercising regularly. He no longer smokes cigarettes. He has chronic right bundle branch block.   When I saw him in May 2015  he had been taking off his metoprolol due to continued fatigability. He does have a history of hypothyroidism, and he has been on Synthroid 150 mcg.    Laboratory done by Dr. Renne Crigler in April 2015 showed a hemoglobin of 14.2, hematocrit 42.0.  BUN 2621.5.  Lipid studies were excellent with a total cholesterol 119 triglycerides were slightly elevated at 179, HDL 44, calculated LDL 39.  Urinalysis was negative.   When I last saw him in 2016 he had noticed a significant change in his ability to exercise.  He has noticed a dramatic increase in exertional shortness of breath.  He underwent palmar a function studies by Dr. Renne Crigler which revealed airway obstruction and a diffusion defect suggesting emphysema but the absence of overinflation was inconsistent with that diagnosis.  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  Because of his significant symptom change he underwent  definitive catheterization on 11/16/2014 which showed two-vessel disease with 50% mid circumflex stenosis, and there was a 90% mid distal LAD lesion secondary to dynamic systolic bridging which normalized during diastole.  Increased medical therapy was recommended.  He has been evaluated at [facility] in a pulmonary function studies.  He was seen in the office by Azalee Course, PA January 2018 with low blood pressure.    He was seen by Azalee Course in September 2018 after he was concerned that Crestor was causing increasing dyspnea on exertion.  His dyspnea improved after he stopped Crestor.  During that evaluation, he initiated pravastatin at 20 mg, which the patient has tolerated and denies any recurrent increasing shortness of breath.  He has emphysema.   I saw him in December 2018 at which time he denied any chest tightness or palpitations.  He now goes to the gym at least 4 days/week.  He denies chest pain PND orthopnea.  At times he has noticed some slight low blood pressure and mild dizziness if he stands abruptly.  He has continued to take isosorbide 90 mg, rosuvastatin 5 mg every other day levothyroxine 175 mcg.  He also is on an oral inhaler.  He is no longer on pravastatin.  He is no longer on Ranexa.  I last saw him on July 24, 2018.  At that time, I recommended he reduce his isosorbide dose to 30 mg.  His ECG showed chronic right bundle branch block with first-degree AV block which was stable.  He was last  evaluated by me on September 02, 2019 in a telemedicine evaluation.  Over the prior year he continued to be evaluated by Dr. Vaughan Browner for his COPD/emphysema.  He was undergoing pulmonary rehabilitation which was stopped.  He has had issues with low back discomfort making it difficult for him to walk but he states he can get on a treadmill since he holds the railing and is able to tolerate doing this 4 days/week.  He was evaluated in the emergency room in early March 2021 with diverticulitis.   Presently his dizziness has resolved with reduction of his isosorbide.  He denies any chest pain or awareness of palpitations.  He still sees Dr. Shelia Media and has not had recent laboratory.  He was on Trelegy for his lung disease and denied any awareness of significant wheezing.  He denied leg swelling.  Since I last saw him, he denies any chest pain PND orthopnea.  He does note mild shortness of breath with activity.  He also notes his heart rate can increase with exercise fairly rapidly.  At home he has a treadmill and a leg machine. He had seen Dr. Deland Pretty who checked complete laboratory.  Hemoglobin hematocrit were stable at 14 0.3 and 41.1.  Renal function was normal with a BUN of 17 creatinine 1.22.  Potassium was 4.1.  LFTs were normal.  TSH 2.19.  PSA 2.0.  Lipid studies remained stable with total cholesterol 145, triglycerides 124, LDL 68 and HDL 55.Marland Kitchen  He presents for evaluation.  Past Medical History:  Diagnosis Date   Anxiety and depression    BPH (benign prostatic hyperplasia)    CAD (coronary artery disease)    COPD (chronic obstructive pulmonary disease) (HCC)    DOE (dyspnea on exertion)    ED (erectile dysfunction)    Emphysema of lung (HCC)    First degree AV block    GERD (gastroesophageal reflux disease)    Glaucoma    Gout    Gynecomastia    H/O ETOH abuse    Hemochromatosis    Hyperlipidemia    Hypertension    Hypothyroidism    Macular degeneration    OSA (obstructive sleep apnea)    Osteopenia    Peripheral neuropathy    RLS (restless legs syndrome)    Thrombocytopenia ([place])     Past Surgical History:  Procedure Laterality Date   CARDIAC CATHETERIZATION  03/27/2008   Medical therapy   CARDIAC CATHETERIZATION  06/03/2010   Medical theray and smoking cessation   CARDIAC CATHETERIZATION N/A 11/24/2014   Procedure: Left Heart Cath and Coronary Angiography;  Surgeon: Troy Sine, MD;  Location: [facility];  Service:  Cardiovascular;  Laterality: N/A;   CARDIOPULMONARY EXERCISE TEST  02/12/2012   Peak VO2 63% of predicted   ORIF FEMUR FRACTURE Right    TRANSTHORACIC ECHOCARDIOGRAM  11/28/2011   EF >55%, normal    No Known Allergies  Current Outpatient Medications  Medication Sig Dispense Refill   aspirin EC 81 MG tablet Take 81 mg by mouth daily.     Fluticasone-Umeclidin-Vilant (TRELEGY ELLIPTA) 100-62.5-25 MCG/INH AEPB Inhale 1 puff into the lungs daily. 60 each 5   isosorbide mononitrate (IMDUR) 60 MG 24 hr tablet Take 1.5 tablets (90 mg total) by mouth daily. TAKE 1 AND 1/2 TABLETS(90 MG) BY MOUTH DAILY 135 tablet 3   levothyroxine (SYNTHROID) 112 MCG tablet Take 112 mcg by mouth daily.     rOPINIRole (REQUIP) 1 MG tablet Take 1 mg by mouth  2 (two) times daily.      rosuvastatin (CRESTOR) 5 MG tablet Take 5 mg by mouth every other day.     sertraline (ZOLOFT) 100 MG tablet Take 100 mg by mouth daily.      tamsulosin (FLOMAX) 0.4 MG CAPS capsule Take 0.4 mg by mouth daily.      No current facility-administered medications for this visit.    Social History   Socioeconomic History   Marital status: Married    Spouse name: Not on file   Number of children: Not on file   Years of education: Not on file   Highest education level: Not on file  Occupational History   Not on file  Tobacco Use   Smoking status: Former Smoker    Packs/day: 2.00    Years: 50.00    Pack years: 100.00    Types: Cigarettes    Start date: 04/11/1960    Quit date: 03/19/2011    Years since quitting: 8.6   Smokeless tobacco: Never Used   Tobacco comment: Quit for 1 year total   Substance and Sexual Activity   Alcohol use: No    Alcohol/week: 0.0 standard drinks   Drug use: No   Sexual activity: Not on file  Other Topics Concern   Not on file  Social History Narrative   [facility] (03/06/17):   Originally from [place], [place]. Has always lived in [place]. Has traveled to [place], [place], [place], [place], [place], & [place]. Has a dog currently. No bird exposure. No mold or hot tub exposure. Previously enjoyed riding horses and playing golf. Currently has no hobbies. 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    Social Determinants of Health   Financial Resource Strain:    Difficulty of Paying Living Expenses:   Food Insecurity:    Worried About Programme researcher, broadcasting/film/video in the Last Year:    Barista in the Last Year:   Transportation Needs:    Freight forwarder (Medical):    Lack of Transportation (Non-Medical):   Physical Activity:    Days of Exercise per Week:    Minutes of Exercise per Session:   Stress:    Feeling of Stress :   Social Connections:    Frequency of Communication with Friends and Family:    Frequency of Social Gatherings with Friends and Family:    Attends Religious Services:    Active Member of Clubs or Organizations:    Attends Engineer, structural:    Marital Status:   Intimate Partner Violence:    Fear of Current or Ex-Partner:    Emotionally Abused:    Physically Abused:    Sexually Abused:     Family History  Adopted: Yes   Additional social history is notable in that he is married has 2 children. He does walk. He quit smoking in 2012. His alcohol use.  ROS General: Positive for mild fatigue; No fevers, chills, or night sweats;  HEENT: Negative; No changes in vision or hearing, sinus congestion, difficulty swallowing Pulmonary:  Positive for  emphysema.  Shortness of breath Cardiovascular:  See history of present illness GI: Positive for constipation and difficult to wipe his stool; No nausea, vomiting, diarrhea, or abdominal pain GU: Negative; No dysuria, hematuria, or difficulty voiding Musculoskeletal: Negative; no myalgias, joint pain, or weakness Hematologic/Oncology: Negative; no easy bruising, bleeding Endocrine: Positive for hypothyroidism ,  on Synthroid replacement; no  heat/cold intolerance; no diabetes Neuro: Negative; no changes in balance, headaches Skin: Negative; No rashes or skin lesions Psychiatric: Negative; No behavioral problems, depression Sleep: Negative; No snoring, daytime sleepiness, hypersomnolence, bruxism, restless legs, hypnogognic hallucinations, no cataplexy Other comprehensive 14 point system review is negative.   PE BP 132/60    Pulse 96    Ht 6' (1.829 m)    Wt 173 lb 6.4 oz (78.7 kg)    BMI 23.52 kg/m    Repeat blood pressure by me was 108/64 supine and 104/60 standing.  Wt Readings from Last 3 Encounters:  11/17/19 173 lb 6.4 oz (78.7 kg)  09/02/19 170 lb (77.1 kg)  05/20/19 166 lb 7.2 oz (75.5 kg)   General: Alert, oriented, no distress.  Skin: normal turgor, no rashes, warm and dry HEENT: Normocephalic, atraumatic. Pupils equal round and reactive to light; sclera anicteric; extraocular muscles intact;  Nose without nasal septal hypertrophy Mouth/Parynx benign; Mallinpatti scale 3 Neck: No JVD, no carotid bruits; normal carotid upstroke Lungs: clear to ausculatation and percussion; no wheezing or rales Chest wall: without tenderness to palpitation Heart: PMI not displaced, RRR, s1 s2 normal, 1/6 systolic murmur, no diastolic murmur, no rubs, gallops, thrills, or heaves Abdomen: soft, nontender; no hepatosplenomehaly, BS+; abdominal aorta nontender and not dilated by palpation. Back: no CVA tenderness Pulses 2+ Musculoskeletal: full range of motion, normal strength, no joint deformities Extremities: no clubbing cyanosis or edema, Homan's sign negative  Neurologic: grossly nonfocal; Cranial nerves grossly wnl Psychologic: Normal mood and affect   ECG (independently read by me): Sinus rhythm with first-degree AV block.  Heart rate 96 bpm.  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  QTc interval 447 ms.  July 24, 2018 ECG (independently  read by me): Sinus bradycardia 59 bpm, first-degree AV block with PR 244 ms.  Right bundle branch block with repolarization changes, left anterior hemiblock.  December 2018 ECG (independently read by me): Normal sinus rhythm at 60 bpm with first-degree AV block.  Right bundle branch block with repolarization changes.  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  PR interval 254 ms.  QTc interval 440 ms.  May 2018 ECG (independently read by me): Normal sinus rhythm with first-degree AV block at 61 bpm.  PR interval 260 ms.  Right bundle-branch block with repolarization changes.  June 2016 ECG (independently read by me):  Sinus rhythm with first-degree AV block.  Right bundle-branch block.  PR interval 266 ms. inferior infarct.  February 2016ECG (independently read by me): Sinus rhythm at 64 bpm. ,  First-degree AV block.  Right bundle-branch block with repolarization changes.  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  The computer was incorrect and reading this as an undetermined rhythm.  May 2015 ECG  normal sinus rhythm at 62 beats per minute.  Right bundle branch block with repolarization changes.  First-degree AV block with PR interval of 252 ms.  Prior October 2014 ECG: Sinus rhythm with right bundle branch block with first-degree AV block.  LABS:  BMP Latest Ref Rng & Units 08/04/2019 06/19/2016 08/13/2015  Glucose 70 - 99 mg/dL 93 88 93  BUN 8 - 23 mg/dL 23 23 26(H)  Creatinine 0.61 - 1.24 mg/dL 1.38(H) 1.44(H) 1.43  Sodium 135 - 145 mmol/L 141 141 141  Potassium 3.5 - 5.1 mmol/L 4.1 3.8 4.0  Chloride 98 - 111 mmol/L 108 107 106  CO2 22 - 32 mmol/L '25 21 23  '$ Calcium 8.9 - 10.3 mg/dL 9.4 9.4 9.6   Hepatic  Function Latest Ref Rng & Units 08/04/2019 11/20/2014 12/06/2009  Total Protein 6.5 - 8.1 g/dL 6.5 6.8 7.4  Albumin 3.5 - 5.0 g/dL 3.9 4.1 4.5  AST 15 - 41 U/L '27 20 17  '$ ALT 0 - 44 U/L '17 18 9  '$ Alk Phosphatase 38 - 126 U/L 91 101 87  Total Bilirubin 0.3 - 1.2 mg/dL 1.3(H) 1.0 0.9   CBC Latest Ref Rng &  Units 08/04/2019 06/26/2018 06/19/2016  WBC 4.0 - 10.5 K/uL 9.2 7.4 7.2  Hemoglobin 13.0 - 17.0 g/dL 14.7 14.5 14.7  Hematocrit 39 - 52 % 43.4 42.3 43.2  Platelets 150 - 400 K/uL 119(L) 138.0(L) 140   Lab Results  Component Value Date   MCV 95.8 08/04/2019   MCV 92.3 06/26/2018   MCV 94.1 06/19/2016   Lab Results  Component Value Date   TSH 3.70 08/13/2015   Lipid Panel  No results found for: CHOL, TRIG, HDL, CHOLHDL, VLDL, LDLCALC, LDLDIRECT   RADIOLOGY: No results found.  IMPRESSION:  1. Coronary artery disease involving native coronary artery of native heart without angina pectoris   2. Hypothyroidism, unspecified type   3. RBBB   4. Hyperlipidemia with target LDL less than 70   5. Pulmonary emphysema, unspecified emphysema type ([place])     ASSESSMENT AND PLAN: Mr. Turck is a 79 year old white male who has established CAD with documented midsystolic bridging of the LAD.  Catheterization in January 2012  revealed vigorous left ventricle contractility with LVH.  There was evidence for significant mid LAD, mid systolic bridging with the vessel appeared normal during diastole and narrowing to 95% focally in the intramyocardial segment during systole.  There was slight progression of previously documented circumflex disease with the proximal AV groove circumflex narrowing to 50-60% and 30-40% narrowing in the AV groove circumflex after the second marginal branch.  Due to increasing symptomatology and an equivocal nuclear study repeat catheterization on 11/24/2014 was unchanged and showed again dynamic systolic bridging in the mid LAD intramyocardial segment, which now at 80-90% during systole and was 0% during diastole.  The mid AV groove circumflex had 50% stenosis in a dominant left circumflex system.  Over the last several years he has continued to be stable from a cardiovascular standpoint.  He is not having any anginal symptomatology.  On prior evaluation his dose of isosorbide was  reduced due to low blood pressure.  Fortunately he has continued be off cigarettes since he quit in May 2012.  Blood pressure today is stable on his isosorbide mononitrate.  He continues to have little less than 100 mg with hypothyroidism.  Lipid studies are excellent on rosuvastatin 5 mg.  He continues to be on Requip for restless legs.  His emphysema is stable on Trelegy Ellipta.  He does admit to exertional dyspnea.  I reviewed the recent evaluation from Dr. Loney Loh as well as his laboratory.  I will see him in 6 months for reevaluation or sooner as needed.   Troy Sine, MD, [facility]  11/19/2019 2:56 PM

## 2019-11-19 ENCOUNTER — Encounter: Payer: Self-pay | Admitting: Cardiovascular Disease

## 2019-12-03 DIAGNOSIS — R0981 Nasal congestion: Secondary | ICD-10-CM | POA: Diagnosis not present

## 2019-12-24 DIAGNOSIS — E7800 Pure hypercholesterolemia, unspecified: Secondary | ICD-10-CM | POA: Diagnosis not present

## 2019-12-24 DIAGNOSIS — I10 Essential (primary) hypertension: Secondary | ICD-10-CM | POA: Diagnosis not present

## 2019-12-24 DIAGNOSIS — M48062 Spinal stenosis, lumbar region with neurogenic claudication: Secondary | ICD-10-CM | POA: Diagnosis not present

## 2019-12-25 DIAGNOSIS — H353114 Nonexudative age-related macular degeneration, right eye, advanced atrophic with subfoveal involvement: Secondary | ICD-10-CM | POA: Diagnosis not present

## 2019-12-25 DIAGNOSIS — H353221 Exudative age-related macular degeneration, left eye, with active choroidal neovascularization: Secondary | ICD-10-CM | POA: Diagnosis not present

## 2019-12-25 DIAGNOSIS — H35373 Puckering of macula, bilateral: Secondary | ICD-10-CM | POA: Diagnosis not present

## 2019-12-25 DIAGNOSIS — H43813 Vitreous degeneration, bilateral: Secondary | ICD-10-CM | POA: Diagnosis not present

## 2019-12-31 ENCOUNTER — Encounter: Payer: Self-pay | Admitting: Pulmonary Disease

## 2019-12-31 ENCOUNTER — Ambulatory Visit: Payer: Medicare Other | Admitting: Pulmonary Disease

## 2019-12-31 ENCOUNTER — Other Ambulatory Visit: Payer: Self-pay

## 2019-12-31 VITALS — BP 118/72 | HR 99 | Temp 98.4°F | Ht 72.0 in | Wt 169.4 lb

## 2019-12-31 DIAGNOSIS — J449 Chronic obstructive pulmonary disease, unspecified: Secondary | ICD-10-CM

## 2019-12-31 DIAGNOSIS — Z720 Tobacco use: Secondary | ICD-10-CM

## 2019-12-31 NOTE — Progress Notes (Signed)
Timothy Spencer    [id number]    [birth year]  Primary Care Physician:Pharr, Zollie Beckers, MD  Referring Physician: Merri Brunette, MD [address] [place],  [place] [address]  Chief complaint: Follow-up for dyspnea, moderate COPD  HPI: 79 year old with moderate COPD, hypertension, hypothyroidism, restless leg syndrome, coronary artery disease.  Seen at [facility] pulmonary for evaluation of dyspnea in 2017.  He had a cardiopulmonary exercise test done there which showed deconditioning.  Also has history of right bundle branch block, coronary artery disease and follows with cardiology on a regular basis. He has enrolled in an exercise program and works out 4 times a week.  Reports marked improvement in symptoms of dyspnea.  Interim history: Used to be active going to the gym 3-4 times a day to do aerobic and strength training.  This is stopped due to Covid pandemic He remains active at home and is using the treadmill  Continues on Trelegy inhaler.  Has chronic dyspnea on exertion  Outpatient Encounter Medications as of 12/31/2019  Medication Sig  . aspirin EC 81 MG tablet Take 81 mg by mouth daily.  . Fluticasone-Umeclidin-Vilant (TRELEGY ELLIPTA) 100-62.5-25 MCG/INH AEPB Inhale 1 puff into the lungs daily.  . isosorbide mononitrate (IMDUR) 60 MG 24 hr tablet Take 1.5 tablets (90 mg total) by mouth daily. TAKE 1 AND 1/2 TABLETS(90 MG) BY MOUTH DAILY  . levothyroxine (SYNTHROID) 112 MCG tablet Take 112 mcg by mouth daily.  Marland Kitchen rOPINIRole (REQUIP) 1 MG tablet Take 1 mg by mouth 2 (two) times daily.   . rosuvastatin (CRESTOR) 5 MG tablet Take 5 mg by mouth every other day.  . sertraline (ZOLOFT) 100 MG tablet Take 100 mg by mouth daily.   . tamsulosin (FLOMAX) 0.4 MG CAPS capsule Take 0.4 mg by mouth daily.    No facility-administered encounter medications on file as of 12/31/2019.   Physical Exam: Blood pressure 118/72, pulse 99, temperature 98.4 F (36.9 C),  temperature source Oral, height 6' (1.829 m), weight 169 lb 6.4 oz (76.8 kg), SpO2 96 %. Gen:      No acute distress HEENT:  EOMI, sclera anicteric Neck:     No masses; no thyromegaly Lungs:    Clear to auscultation bilaterally; normal respiratory effort CV:         Regular rate and rhythm; no murmurs Abd:      + bowel sounds; soft, non-tender; no palpable masses, no distension Ext:    No edema; adequate peripheral perfusion Skin:      Warm and dry; no rash Neuro: alert and oriented x 3 Psych: normal mood and affect  Data Reviewed: PFTs  06/26/2018 FVC 2.93 [55%], FEV1 2.03 [60%], F/F 69, TLC 5.54 (10 4%), DLCO 16.26 (46%) Moderate obstruction with mild restriction and severe diffusion defect  04/11/17:  Walked 312 Meters / Baseline Sat 100% on RA / Nadir Sat 98% on RA  Imaging CT chest 10/11/16- 4 mm left lower lobe nodule.  Previously noted lower lobe opacities have resolved.  Centrilobular emphysema.  Pleural thickening versus small effusion on the left CT chest 07/13/16-6 mm left lower lobe nodule.  9 mm left lower lobe nodule.  4 mm left lower lobe nodule.   Questionable pleural thickening versus pleural effusion CT 10/12/2017- Emphysema, stable pulmonary nodules.  Pleural-parenchymal scarring at the right hemithorax. Chest x-ray 06/26/18-chronic right hemidiaphragm elevation with mild atelectasis. I have reviewed the images personally.  ESOPHAGRAM/BARIUM SWALLOW 09/10/15:  Small  hiatal hernia with mild stricture at the gastroesophageal junction. Negative for reflux.  CARDIAC TTE (12/15/15):  LV normal in size with EF 60-65%. Impaired diastolic function of LV. LA & RA normal in size. RV normal in size & function. No aortic stenosis or regurgitation. Trace mitral regurgitation. Trace tricuspid regurgitation. Normal aortic root. No pericardial effusion. Trace pulmonic regurgitation.   LABS Alpha-1 antitrypsin 03/06/2017-159, MS phenotype Alpha-1 antitrypsin 06/06/2018-156  CBC  06/26/2018-WBC 7.4, eos 2%, absolute eosinophil count 148 IgE 149  01/29/09 ANA:  Negative RA:  79  Cardio pulmonary Exercise stress test 2017 from [facility] This cardiopulmonary exercise test demonstrated a decreased exercise capacity. The patient did not demonstrate a pulmonary limitation. Given the patient's anaerobic threshold > 40% with increased heart rate reserve, poor effort or deconditioning can account for the patient's symptoms. However, the patient did display a reduced oxygen pulse which is an indirect measure of stroke volume which may indicate early cardiovascular disease. Please note that the patient's baseline EKG displayed a RBBB limiting an evaluation of ST segment alterations during the procedure.  Assessment:  Moderate COPD Continue on Trelegy inhaler. 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  Last checked alpha-1 antitrypsin levels are normal.  We will continue to monitor.  Left lower lobe pulmonary nodule. Continue annual screening.  Health maintenance 03/02/2019-influenza Patient states that he is up-to-date with his pneumonia vaccine at his primary care. He is also vaccinated against Covid  Plan/Recommendations: - Continue Trelegy - Exercise program  Chilton Greathouse MD [facility] 12/31/2019, 2:10 PM  CC: Merri Brunette, MD

## 2019-12-31 NOTE — Patient Instructions (Signed)
Continue the Trelegy inhaler Continue the exercise regimen We will make sure that you resume the low-dose screening CTs of the chest for this year  Follow-up in 1 year.

## 2020-01-07 DIAGNOSIS — E7800 Pure hypercholesterolemia, unspecified: Secondary | ICD-10-CM | POA: Diagnosis not present

## 2020-01-07 DIAGNOSIS — I2510 Atherosclerotic heart disease of native coronary artery without angina pectoris: Secondary | ICD-10-CM | POA: Diagnosis not present

## 2020-01-07 DIAGNOSIS — I10 Essential (primary) hypertension: Secondary | ICD-10-CM | POA: Diagnosis not present

## 2020-01-08 ENCOUNTER — Other Ambulatory Visit: Payer: Self-pay | Admitting: Pulmonary Disease

## 2020-01-20 DIAGNOSIS — Z125 Encounter for screening for malignant neoplasm of prostate: Secondary | ICD-10-CM | POA: Diagnosis not present

## 2020-01-22 DIAGNOSIS — I2510 Atherosclerotic heart disease of native coronary artery without angina pectoris: Secondary | ICD-10-CM | POA: Diagnosis not present

## 2020-01-22 DIAGNOSIS — I10 Essential (primary) hypertension: Secondary | ICD-10-CM | POA: Diagnosis not present

## 2020-01-22 DIAGNOSIS — E7800 Pure hypercholesterolemia, unspecified: Secondary | ICD-10-CM | POA: Diagnosis not present

## 2020-01-22 DIAGNOSIS — E039 Hypothyroidism, unspecified: Secondary | ICD-10-CM | POA: Diagnosis not present

## 2020-02-16 DIAGNOSIS — R197 Diarrhea, unspecified: Secondary | ICD-10-CM | POA: Diagnosis not present

## 2020-02-16 DIAGNOSIS — Z8719 Personal history of other diseases of the digestive system: Secondary | ICD-10-CM | POA: Diagnosis not present

## 2020-02-16 DIAGNOSIS — R152 Fecal urgency: Secondary | ICD-10-CM | POA: Diagnosis not present

## 2020-02-16 DIAGNOSIS — R159 Full incontinence of feces: Secondary | ICD-10-CM | POA: Diagnosis not present

## 2020-02-19 DIAGNOSIS — R197 Diarrhea, unspecified: Secondary | ICD-10-CM | POA: Diagnosis not present

## 2020-03-04 DIAGNOSIS — R197 Diarrhea, unspecified: Secondary | ICD-10-CM | POA: Diagnosis not present

## 2020-03-04 DIAGNOSIS — H353221 Exudative age-related macular degeneration, left eye, with active choroidal neovascularization: Secondary | ICD-10-CM | POA: Diagnosis not present

## 2020-03-04 DIAGNOSIS — H43813 Vitreous degeneration, bilateral: Secondary | ICD-10-CM | POA: Diagnosis not present

## 2020-03-04 DIAGNOSIS — H35373 Puckering of macula, bilateral: Secondary | ICD-10-CM | POA: Diagnosis not present

## 2020-03-04 DIAGNOSIS — H353114 Nonexudative age-related macular degeneration, right eye, advanced atrophic with subfoveal involvement: Secondary | ICD-10-CM | POA: Diagnosis not present

## 2020-03-13 ENCOUNTER — Ambulatory Visit: Payer: Self-pay

## 2020-04-14 ENCOUNTER — Other Ambulatory Visit: Payer: Self-pay | Admitting: *Deleted

## 2020-04-14 DIAGNOSIS — Z87891 Personal history of nicotine dependence: Secondary | ICD-10-CM

## 2020-04-29 DIAGNOSIS — H353114 Nonexudative age-related macular degeneration, right eye, advanced atrophic with subfoveal involvement: Secondary | ICD-10-CM | POA: Diagnosis not present

## 2020-04-29 DIAGNOSIS — H353221 Exudative age-related macular degeneration, left eye, with active choroidal neovascularization: Secondary | ICD-10-CM | POA: Diagnosis not present

## 2020-04-29 DIAGNOSIS — H35371 Puckering of macula, right eye: Secondary | ICD-10-CM | POA: Diagnosis not present

## 2020-04-29 DIAGNOSIS — H43813 Vitreous degeneration, bilateral: Secondary | ICD-10-CM | POA: Diagnosis not present

## 2020-05-04 DIAGNOSIS — R260 Ataxic gait: Secondary | ICD-10-CM | POA: Diagnosis not present

## 2020-05-04 DIAGNOSIS — M545 Low back pain, unspecified: Secondary | ICD-10-CM | POA: Diagnosis not present

## 2020-05-06 DIAGNOSIS — M545 Low back pain, unspecified: Secondary | ICD-10-CM | POA: Diagnosis not present

## 2020-05-06 DIAGNOSIS — R260 Ataxic gait: Secondary | ICD-10-CM | POA: Diagnosis not present

## 2020-05-12 ENCOUNTER — Other Ambulatory Visit: Payer: Self-pay | Admitting: Cardiovascular Disease

## 2020-05-18 DIAGNOSIS — R260 Ataxic gait: Secondary | ICD-10-CM | POA: Diagnosis not present

## 2020-05-18 DIAGNOSIS — M545 Low back pain, unspecified: Secondary | ICD-10-CM | POA: Diagnosis not present

## 2020-05-25 DIAGNOSIS — R260 Ataxic gait: Secondary | ICD-10-CM | POA: Diagnosis not present

## 2020-05-25 DIAGNOSIS — M545 Low back pain, unspecified: Secondary | ICD-10-CM | POA: Diagnosis not present

## 2020-05-31 DIAGNOSIS — K5903 Drug induced constipation: Secondary | ICD-10-CM | POA: Diagnosis not present

## 2020-06-01 DIAGNOSIS — M545 Low back pain, unspecified: Secondary | ICD-10-CM | POA: Diagnosis not present

## 2020-06-01 DIAGNOSIS — R260 Ataxic gait: Secondary | ICD-10-CM | POA: Diagnosis not present

## 2020-06-21 DIAGNOSIS — H353114 Nonexudative age-related macular degeneration, right eye, advanced atrophic with subfoveal involvement: Secondary | ICD-10-CM | POA: Diagnosis not present

## 2020-06-21 DIAGNOSIS — H353221 Exudative age-related macular degeneration, left eye, with active choroidal neovascularization: Secondary | ICD-10-CM | POA: Diagnosis not present

## 2020-07-11 ENCOUNTER — Other Ambulatory Visit: Payer: Self-pay | Admitting: Pulmonary Disease

## 2020-07-15 DIAGNOSIS — I10 Essential (primary) hypertension: Secondary | ICD-10-CM | POA: Diagnosis not present

## 2020-07-29 DIAGNOSIS — I10 Essential (primary) hypertension: Secondary | ICD-10-CM | POA: Diagnosis not present

## 2020-08-02 ENCOUNTER — Ambulatory Visit: Payer: Medicare Other | Admitting: Pulmonary Disease

## 2020-08-02 ENCOUNTER — Encounter: Payer: Self-pay | Admitting: Pulmonary Disease

## 2020-08-02 ENCOUNTER — Other Ambulatory Visit: Payer: Self-pay

## 2020-08-02 VITALS — BP 126/62 | HR 89 | Temp 97.4°F | Ht 72.0 in | Wt 163.0 lb

## 2020-08-02 DIAGNOSIS — J449 Chronic obstructive pulmonary disease, unspecified: Secondary | ICD-10-CM | POA: Diagnosis not present

## 2020-08-02 NOTE — Patient Instructions (Signed)
Continue the Trelegy inhaler We will schedule PFTs in 4 to 6 months Follow-up in clinic after PFTs

## 2020-08-02 NOTE — Progress Notes (Signed)
Timothy Spencer    [id number]    [birth year]  Primary Care Physician:Pharr, Zollie Beckers, MD  Referring Physician: Merri Brunette, MD [address] [place],  [place] [address]  Chief complaint: Follow-up for dyspnea, moderate COPD  HPI: 80 year old with moderate COPD, hypertension, hypothyroidism, restless leg syndrome, coronary artery disease.  Seen at [facility] pulmonary for evaluation of dyspnea in 2017.  He had a cardiopulmonary exercise test done there which showed deconditioning.  Also has history of right bundle branch block, coronary artery disease and follows with cardiology on a regular basis. He has enrolled in an exercise program and works out 4 times a week.  Reports marked improvement in symptoms of dyspnea.  Interim history: Continues to be active with exercise interim States that he feels that dyspnea is slightly worse Denies any cough, sputum production, wheezing  Outpatient Encounter Medications as of 08/02/2020  Medication Sig  . aspirin EC 81 MG tablet Take 81 mg by mouth daily.  . isosorbide mononitrate (IMDUR) 60 MG 24 hr tablet TAKE 1 AND 1/2 TABLETS(90 MG) BY MOUTH DAILY  . levothyroxine (SYNTHROID) 112 MCG tablet Take 112 mcg by mouth daily.  Marland Kitchen rOPINIRole (REQUIP) 1 MG tablet Take 1 mg by mouth 2 (two) times daily.   . rosuvastatin (CRESTOR) 5 MG tablet Take 5 mg by mouth every other day.  . sertraline (ZOLOFT) 100 MG tablet Take 100 mg by mouth daily.   . tamsulosin (FLOMAX) 0.4 MG CAPS capsule Take 0.4 mg by mouth daily.   . TRELEGY ELLIPTA 100-62.5-25 MCG/INH AEPB INHALE 1 PUFF INTO THE LUNGS DAILY   No facility-administered encounter medications on file as of 08/02/2020.   Physical Exam: Blood pressure 126/62, pulse 89, temperature (!) 97.4 F (36.3 C), temperature source Temporal, height 6' (1.829 m), weight 163 lb (73.9 kg), SpO2 99 %. Gen:      No acute distress HEENT:  EOMI, sclera anicteric Neck:     No masses; no  thyromegaly Lungs:    Clear to auscultation bilaterally; normal respiratory effort CV:         Regular rate and rhythm; no murmurs Abd:      + bowel sounds; soft, non-tender; no palpable masses, no distension Ext:    No edema; adequate peripheral perfusion Skin:      Warm and dry; no rash Neuro: alert and oriented x 3 Psych: normal mood and affect  Data Reviewed: PFTs  06/26/2018 FVC 2.93 [55%], FEV1 2.03 [60%], F/F 69, TLC 5.54 (10 4%), DLCO 16.26 (46%) Moderate obstruction with mild restriction and severe diffusion defect  04/11/17:  Walked 312 Meters / Baseline Sat 100% on RA / Nadir Sat 98% on RA  Imaging CT chest 10/11/16- 4 mm left lower lobe nodule.  Previously noted lower lobe opacities have resolved.  Centrilobular emphysema.  Pleural thickening versus small effusion on the left CT chest 07/13/16-6 mm left lower lobe nodule.  9 mm left lower lobe nodule.  4 mm left lower lobe nodule.   Questionable pleural thickening versus pleural effusion CT 10/12/2017- Emphysema, stable pulmonary nodules.  Pleural-parenchymal scarring at the right hemithorax. Chest x-ray 06/26/18-chronic right hemidiaphragm elevation with mild atelectasis. I have reviewed the images personally.  ESOPHAGRAM/BARIUM SWALLOW 09/10/15:  Small hiatal hernia with mild stricture at the gastroesophageal junction. Negative for reflux.  CARDIAC TTE (12/15/15):  LV normal in size with EF 60-65%. Impaired diastolic function of LV. LA & RA normal in size. RV  normal in size & function. No aortic stenosis or regurgitation. Trace mitral regurgitation. Trace tricuspid regurgitation. Normal aortic root. No pericardial effusion. Trace pulmonic regurgitation.   LABS Alpha-1 antitrypsin 03/06/2017-159, MS phenotype Alpha-1 antitrypsin 06/06/2018-156  CBC 06/26/2018-WBC 7.4, eos 2%, absolute eosinophil count 148 IgE 149  01/29/09 ANA:  Negative RA:  79  Cardio pulmonary Exercise stress test 2017 from [facility] This  cardiopulmonary exercise test demonstrated a decreased exercise capacity. The patient did not demonstrate a pulmonary limitation. Given the patient's anaerobic threshold > 40% with increased heart rate reserve, poor effort or deconditioning can account for the patient's symptoms. However, the patient did display a reduced oxygen pulse which is an indirect measure of stroke volume which may indicate early cardiovascular disease. Please note that the patient's baseline EKG displayed a RBBB limiting an evaluation of ST segment alterations during the procedure.  Assessment:  Moderate COPD Continue on Trelegy inhaler. Continue exercise regimen at home  He is asking about endobronchial valve.  We will repeat PFTs to check for hyperinflation to see if he will qualify.   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  Last checked alpha-1 antitrypsin levels are normal.  We will continue to monitor.  Left lower lobe pulmonary nodule. Continue annual screening.  Health maintenance 03/02/2019-influenza Patient states that he is up-to-date with his pneumonia vaccine at his primary care. He is also vaccinated against Covid  Plan/Recommendations: - Continue Trelegy - Exercise program - PFTs  Chilton Greathouse MD [facility] 08/02/2020, 9:32 AM  CC: Merri Brunette, MD

## 2020-08-05 DIAGNOSIS — H353114 Nonexudative age-related macular degeneration, right eye, advanced atrophic with subfoveal involvement: Secondary | ICD-10-CM | POA: Diagnosis not present

## 2020-08-05 DIAGNOSIS — H353221 Exudative age-related macular degeneration, left eye, with active choroidal neovascularization: Secondary | ICD-10-CM | POA: Diagnosis not present

## 2020-08-05 DIAGNOSIS — H43813 Vitreous degeneration, bilateral: Secondary | ICD-10-CM | POA: Diagnosis not present

## 2020-08-05 DIAGNOSIS — H35371 Puckering of macula, right eye: Secondary | ICD-10-CM | POA: Diagnosis not present

## 2020-08-11 DIAGNOSIS — I10 Essential (primary) hypertension: Secondary | ICD-10-CM | POA: Diagnosis not present

## 2020-09-23 DIAGNOSIS — H353114 Nonexudative age-related macular degeneration, right eye, advanced atrophic with subfoveal involvement: Secondary | ICD-10-CM | POA: Diagnosis not present

## 2020-09-23 DIAGNOSIS — H353221 Exudative age-related macular degeneration, left eye, with active choroidal neovascularization: Secondary | ICD-10-CM | POA: Diagnosis not present

## 2020-10-12 ENCOUNTER — Ambulatory Visit: Payer: Medicare Other | Admitting: Cardiovascular Disease

## 2020-10-12 ENCOUNTER — Encounter: Payer: Self-pay | Admitting: Cardiovascular Disease

## 2020-10-12 ENCOUNTER — Other Ambulatory Visit: Payer: Self-pay

## 2020-10-12 DIAGNOSIS — I10 Essential (primary) hypertension: Secondary | ICD-10-CM

## 2020-10-12 DIAGNOSIS — I2510 Atherosclerotic heart disease of native coronary artery without angina pectoris: Secondary | ICD-10-CM

## 2020-10-12 DIAGNOSIS — I452 Bifascicular block: Secondary | ICD-10-CM

## 2020-10-12 DIAGNOSIS — E039 Hypothyroidism, unspecified: Secondary | ICD-10-CM

## 2020-10-12 DIAGNOSIS — J439 Emphysema, unspecified: Secondary | ICD-10-CM

## 2020-10-12 DIAGNOSIS — I440 Atrioventricular block, first degree: Secondary | ICD-10-CM

## 2020-10-12 NOTE — Patient Instructions (Signed)

## 2020-10-12 NOTE — Progress Notes (Signed)
Patient ID: KENZIE FLAKES, male   DOB: 19-Jul-1940, 80 y.o.   MRN: [id number]      HPI: NTHONY LEFFERTS is a 80 y.o. male who presents for an 105 month follow-up cardiology evaluation.    Mr. Kroening has a previous long-standing history of tobacco abuse  but quit smoking in May 2012 after smoking for over 40 years. He had undergone cardiac catheterization initially in 2009 and subsequently in January 2012 which showed mild CAD with mid systolic bridging of his LAD which narrowed up to 95% during systole and was essentially normal during diastole, 50-60% circumflex stenosis, 30-40% mid AV groove stenosis and he had a nondominant right coronary artery. He  benefited with the addition of Ranexa to his medical regimen. A cardiopulmonary met test subsequently suggested an ischemic response. He has been exercising regularly. He no longer smokes cigarettes. He has chronic right bundle branch block.   When I saw him in May 2015  he had been taking off his metoprolol due to continued fatigability. He does have a history of hypothyroidism, and he has been on Synthroid 150 mcg.    Laboratory done by Dr. Shelia Media in April 2015 showed a hemoglobin of 14.2, hematocrit 42.0.  BUN 2621.5.  Lipid studies were excellent with a total cholesterol 119 triglycerides were slightly elevated at 179, HDL 44, calculated LDL 39.  Urinalysis was negative.   When I last saw him in 2016 he had noticed a significant change in his ability to exercise.  He has noticed a dramatic increase in exertional shortness of breath.  He underwent palmar a function studies by Dr. Shelia Media which revealed airway obstruction and a diffusion defect suggesting emphysema but the absence of overinflation was inconsistent with that diagnosis.  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  Because of his significant symptom change he underwent definitive  catheterization on 11/16/2014 which showed two-vessel disease with 50% mid circumflex stenosis, and there was a 90% mid distal LAD lesion secondary to dynamic systolic bridging which normalized during diastole.  Increased medical therapy was recommended.  He has been evaluated at [facility] in a pulmonary function studies.  He was seen in the office by Almyra Deforest, PA January 2018 with low blood pressure.    He was seen by Almyra Deforest in September 2018 after he was concerned that Crestor was causing increasing dyspnea on exertion.  His dyspnea improved after he stopped Crestor.  During that evaluation, he initiated pravastatin at 20 mg, which the patient has tolerated and denies any recurrent increasing shortness of breath.  He has emphysema.   I saw him in December 2018 at which time he denied any chest tightness or palpitations.  He now goes to the gym at least 4 days/week.  He denies chest pain PND orthopnea.  At times he has noticed some slight low blood pressure and mild dizziness if he stands abruptly.  He has continued to take isosorbide 90 mg, rosuvastatin 5 mg every other day levothyroxine 175 mcg.  He also is on an oral inhaler.  He is no longer on pravastatin.  He is no longer on Ranexa.  I last saw him on July 24, 2018.  At that time, I recommended he reduce his isosorbide dose to 30 mg.  His ECG showed chronic right bundle branch block with first-degree AV block which was stable.  He was  evaluated  by me on September 02, 2019 in a telemedicine evaluation.  Over the prior year he continued to be evaluated by Dr. Vaughan Browner for his COPD/emphysema.  He was undergoing pulmonary rehabilitation which was stopped.  He has had issues with low back discomfort making it difficult for him to walk but he states he can get on a treadmill since he holds the railing and is able to tolerate doing this 4 days/week.  He was evaluated in the emergency room in early March 2021 with diverticulitis.  Presently his  dizziness has resolved with reduction of his isosorbide.  He denies any chest pain or awareness of palpitations.  He still sees Dr. Shelia Media and has not had recent laboratory.  He was on Trelegy for his lung disease and denied any awareness of significant wheezing.  He denied leg swelling.  I last saw him in June 2021.  Since his prior evaluation he had continued to be stable and denied any chest pain, PND, or orthopnea.  He did experience mild shortness of breath with activity.  He has COPD/emphysema.    He also notes his heart rate can increase with exercise fairly rapidly.  At home he has a treadmill and a leg machine. He had seen Dr. Deland Pretty who checked complete laboratory.  Hemoglobin hematocrit were stable at 14 0.3 and 41.1.  Renal function was normal with a BUN of 17 creatinine 1.22.  Potassium was 4.1.  LFTs were normal.  TSH 2.19.  PSA 2.0.  Lipid studies remained stable with total cholesterol 145, triglycerides 124, LDL 68 and HDL 55..    Since I last saw him, he has continued to be stable.  He will be seeing Dr. Deland Pretty next month and will be undergoing a complete set of laboratory.  He denies chest pain or palpitations.  He experiences mild shortness of breath.  He takes Trelegy Ellipta for his emphysema.  He continues to be on aspirin 81 mg, isosorbide 90 mg daily and is without chest pain or anginal symptoms.  He is on low-dose rosuvastatin 5 mg every other day.  He continues to be on levothyroxine for hypothyroidism currently at 112 mcg daily.  He presents for evaluation.  Past Medical History:  Diagnosis Date  . Anxiety and depression   . BPH (benign prostatic hyperplasia)   . CAD (coronary artery disease)   . COPD (chronic obstructive pulmonary disease) ([place])   . DOE (dyspnea on exertion)   . ED (erectile dysfunction)   . Emphysema of lung ([place])   . First degree AV block   . GERD (gastroesophageal reflux disease)   . Glaucoma   . Gout   . Gynecomastia   . H/O ETOH abuse    . Hemochromatosis   . Hyperlipidemia   . Hypertension   . Hypothyroidism   . Macular degeneration   . OSA (obstructive sleep apnea)   . Osteopenia   . Peripheral neuropathy   . RLS (restless legs syndrome)   . Thrombocytopenia ([place])     Past Surgical History:  Procedure Laterality Date  . CARDIAC CATHETERIZATION  03/27/2008   Medical therapy  . CARDIAC CATHETERIZATION  06/03/2010   Medical theray and smoking cessation  . CARDIAC CATHETERIZATION N/A 11/24/2014   Procedure: Left Heart Cath and Coronary Angiography;  Surgeon: Troy Sine, MD;  Location: [facility];  Service: Cardiovascular;  Laterality: N/A;  . CARDIOPULMONARY EXERCISE TEST  02/12/2012   Peak VO2 63% of predicted  . ORIF FEMUR  FRACTURE Right   . TRANSTHORACIC ECHOCARDIOGRAM  11/28/2011   EF >55%, normal    No Known Allergies  Current Outpatient Medications  Medication Sig Dispense Refill  . aspirin EC 81 MG tablet Take 81 mg by mouth daily.    . isosorbide mononitrate (IMDUR) 60 MG 24 hr tablet TAKE 1 AND 1/2 TABLETS(90 MG) BY MOUTH DAILY 135 tablet 3  . levothyroxine (SYNTHROID) 112 MCG tablet Take 112 mcg by mouth daily.    Marland Kitchen rOPINIRole (REQUIP) 1 MG tablet Take 1 mg by mouth 2 (two) times daily.     . rosuvastatin (CRESTOR) 5 MG tablet Take 5 mg by mouth every other day.    . sertraline (ZOLOFT) 100 MG tablet Take 100 mg by mouth daily.     . tamsulosin (FLOMAX) 0.4 MG CAPS capsule Take 0.4 mg by mouth daily.     . TRELEGY ELLIPTA 100-62.5-25 MCG/INH AEPB INHALE 1 PUFF INTO THE LUNGS DAILY 60 each 5   No current facility-administered medications for this visit.    Social History   Socioeconomic History  . Marital status: Married    Spouse name: Not on file  . Number of children: Not on file  . Years of education: Not on file  . Highest education level: Not on file  Occupational History  . Not on file  Tobacco Use  . Smoking status: Former Smoker    Packs/day: 2.00    Years: 50.00     Pack years: 100.00    Types: Cigarettes    Start date: 04/11/1960    Quit date: 03/19/2011    Years since quitting: 9.5  . Smokeless tobacco: Never Used  . Tobacco comment: Quit for 1 year total   Substance and Sexual Activity  . Alcohol use: No    Alcohol/week: 0.0 standard drinks  . Drug use: No  . Sexual activity: Not on file  Other Topics Concern  . Not on file  Social History Narrative   [facility] (03/06/17):   Originally from [place], [place]. Has always lived in [place]. Has traveled to [place], [place], [place], [place], [place], & [place]. Has a dog currently. No bird exposure. No mold or hot tub exposure. Previously enjoyed riding horses and playing golf. Currently has no hobbies. 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    Social Determinants of Health   Financial Resource Strain: Not on file  Food Insecurity: Not on file  Transportation Needs: Not on file  Physical Activity: Not on file  Stress: Not on file  Social Connections: Not on file  Intimate Partner Violence: Not on file    Family History  Adopted: Yes   Additional social history is notable in that he is married has 2 children. He does walk. He quit smoking in 2012. His alcohol use.  ROS General: Positive for mild fatigue; No fevers, chills, or night sweats;  HEENT: Negative; No changes in vision or hearing, sinus congestion, difficulty swallowing Pulmonary:  Positive for  emphysema.  Shortness of breath Cardiovascular:  See history of present illness GI: Positive for constipation and difficult to wipe his stool; No nausea, vomiting, diarrhea, or abdominal pain GU: Negative; No dysuria, hematuria, or difficulty voiding Musculoskeletal: Negative; no myalgias, joint pain, or weakness Hematologic/Oncology: Negative; no easy bruising, bleeding Endocrine: Positive for hypothyroidism , on Synthroid replacement; no heat/cold intolerance; no diabetes Neuro:  Negative; no changes in balance, headaches Skin: Negative; No rashes or skin  lesions Psychiatric: Negative; No behavioral problems, depression Sleep: Negative; No snoring, daytime sleepiness, hypersomnolence, bruxism, restless legs, hypnogognic hallucinations, no cataplexy Other comprehensive 14 point system review is negative.   PE BP (!) 148/70   Pulse 62   Ht 6' (1.829 m)   Wt 162 lb 3.2 oz (73.6 kg)   SpO2 99%   BMI 22.00 kg/m    Repeat blood pressure by me was 108/64 supine and 104/60 standing.  Wt Readings from Last 3 Encounters:  10/12/20 162 lb 3.2 oz (73.6 kg)  08/02/20 163 lb (73.9 kg)  12/31/19 169 lb 6.4 oz (76.8 kg)   General: Alert, oriented, no distress.  Skin: normal turgor, no rashes, warm and dry HEENT: Normocephalic, atraumatic. Pupils equal round and reactive to light; sclera anicteric; extraocular muscles intact;  Nose without nasal septal hypertrophy Mouth/Parynx benign; Mallinpatti scale 3 Neck: No JVD, no carotid bruits; normal carotid upstroke Lungs: clear to ausculatation and percussion; no wheezing or rales Chest wall: without tenderness to palpitation Heart: PMI not displaced, RRR, s1 s2 normal, 1/6 systolic murmur, no diastolic murmur, no rubs, gallops, thrills, or heaves Abdomen: soft, nontender; no hepatosplenomehaly, BS+; abdominal aorta nontender and not dilated by palpation. Back:Kyphotic;  no CVA tenderness Pulses 2+ Musculoskeletal: full range of motion, normal strength, no joint deformities Extremities: no clubbing cyanosis or edema, Homan's sign negative  Neurologic: grossly nonfocal; Cranial nerves grossly wnl Psychologic: Normal mood and affect   ECG (independently read by me): Sinus rhythm at 62 bpm with first-degree AV block (PR interval 260 ms), right bundle branch block with repolarization changes, and left anterior fascicular block.  No ectopy.  QTc interval 477 ms   June 2021 ECG (independently read by me): Sinus rhythm with  first-degree AV block.  Heart rate 96 bpm.  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  QTc interval 447 ms.  July 24, 2018 ECG (independently read by me): Sinus bradycardia 59 bpm, first-degree AV block with PR 244 ms.  Right bundle branch block with repolarization changes, left anterior hemiblock.  December 2018 ECG (independently read by me): Normal sinus rhythm at 60 bpm with first-degree AV block.  Right bundle branch block with repolarization changes.  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  PR interval 254 ms.  QTc interval 440 ms.  May 2018 ECG (independently read by me): Normal sinus rhythm with first-degree AV block at 61 bpm.  PR interval 260 ms.  Right bundle-branch block with repolarization changes.  June 2016 ECG (independently read by me):  Sinus rhythm with first-degree AV block.  Right bundle-branch block.  PR interval 266 ms. inferior infarct.  February 2016ECG (independently read by me): Sinus rhythm at 64 bpm. ,  First-degree AV block.  Right bundle-branch block with repolarization changes.  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  The computer was incorrect and reading this as an undetermined rhythm.  May 2015 ECG  normal sinus rhythm at 62 beats per minute.  Right bundle branch block with repolarization changes.  First-degree AV block with PR interval of 252 ms.  Prior October 2014 ECG: Sinus rhythm with right bundle branch block with first-degree AV block.  LABS:  BMP Latest Ref Rng & Units 08/04/2019 06/19/2016 08/13/2015  Glucose 70 - 99 mg/dL 93 88 93  BUN 8 - 23 mg/dL 23 23 26(H)  Creatinine 0.61 - 1.24 mg/dL 1.38(H) 1.44(H) 1.43  Sodium 135 - 145 mmol/L 141 141 141  Potassium 3.5 - 5.1 mmol/L 4.1 3.8 4.0  Chloride 98 -  111 mmol/L 108 107 106  CO2 22 - 32 mmol/L $RemoveB'25 21 23  'EPnwlwOp$ Calcium 8.9 - 10.3 mg/dL 9.4 9.4 9.6   Hepatic Function Latest Ref Rng & Units 08/04/2019 11/20/2014 12/06/2009  Total Protein 6.5 - 8.1 g/dL 6.5 6.8 7.4  Albumin  3.5 - 5.0 g/dL 3.9 4.1 4.5  AST 15 - 41 U/L $Remo'27 20 17  'axPoD$ ALT 0 - 44 U/L $Remo'17 18 9  'VxgrY$ Alk Phosphatase 38 - 126 U/L 91 101 87  Total Bilirubin 0.3 - 1.2 mg/dL 1.3(H) 1.0 0.9   CBC Latest Ref Rng & Units 08/04/2019 06/26/2018 06/19/2016  WBC 4.0 - 10.5 K/uL 9.2 7.4 7.2  Hemoglobin 13.0 - 17.0 g/dL 14.7 14.5 14.7  Hematocrit 39.0 - 52.0 % 43.4 42.3 43.2  Platelets 150 - 400 K/uL 119(L) 138.0(L) 140   Lab Results  Component Value Date   MCV 95.8 08/04/2019   MCV 92.3 06/26/2018   MCV 94.1 06/19/2016   Lab Results  Component Value Date   TSH 3.70 08/13/2015   Lipid Panel  No results found for: CHOL, TRIG, HDL, CHOLHDL, VLDL, LDLCALC, LDLDIRECT   RADIOLOGY: No results found.  IMPRESSION:  1. Coronary artery disease involving native coronary artery of native heart without angina pectoris   2. Essential hypertension   3. First degree AV block   4. 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 Pulmonary emphysema, unspecified emphysema type ([place])   6. Hypothyroidism, unspecified type     ASSESSMENT AND PLAN: Mr. Fitzhenry is an 80 year-old white male who has established CAD with documented midsystolic bridging of the LAD.  Cardiac atheterization in January 2012  revealed vigorous left ventricle contractility with LVH and  significant mid LAD systolic bridging with the vessel appearing normal during diastole and narrowing to 95% focally in the intramyocardial segment during systole.  There was slight progression of previously documented circumflex disease with the proximal AV groove circumflex narrowing to 50-60% and 30-40% narrowing in the AV groove circumflex after the second marginal branch.  Fortunately, he quit tobacco use in May 2012.  Due to increasing symptomatology and an equivocal nuclear study repeat catheterization on 11/24/2014 was unchanged and showed again dynamic systolic bridging in the mid LAD intramyocardial segment, which now at 80-90% during systole and  was 0% during diastole.  The mid AV groove circumflex had 50% stenosis in a dominant left circumflex system.  The last several years he has continued to be stable from a cardiovascular standpoint.  He denies any recurrent anginal symptomatology.  He is followed by Dr. Deland Pretty and will be undergoing laboratory next month.  LDL cholesterol in May 2021 was 68.  He has normal renal function.  He is not having any anginal symptoms on his current dose of isosorbide 90 mg daily.  He continues to be on baby aspirin and denies bleeding.  He is on low-dose rosuvastatin at just 5 mg every other day with target LDL less than 70.  He is on levothyroxine 112 mcg for hypothyroidism.  TSH remotely was stable at 2.19.  He is on Trelegy Ellipta for his emphysema.  Clinically he continues to do well.  He has stable right bundle branch block, left anterior hemiblock, as well as first-degree AV block on his ECG which is unchanged.  He denies any presyncope or syncope.  At present there is no need for pacemaker.  He will continue current therapy.  I will see him in 1 year for  cardiology reevaluation or sooner as needed.   Troy Sine, MD, [facility]  10/12/2020 2:06 PM

## 2020-11-18 DIAGNOSIS — H353221 Exudative age-related macular degeneration, left eye, with active choroidal neovascularization: Secondary | ICD-10-CM | POA: Diagnosis not present

## 2020-11-18 DIAGNOSIS — H43813 Vitreous degeneration, bilateral: Secondary | ICD-10-CM | POA: Diagnosis not present

## 2020-11-18 DIAGNOSIS — H353114 Nonexudative age-related macular degeneration, right eye, advanced atrophic with subfoveal involvement: Secondary | ICD-10-CM | POA: Diagnosis not present

## 2020-11-18 DIAGNOSIS — H35371 Puckering of macula, right eye: Secondary | ICD-10-CM | POA: Diagnosis not present

## 2020-12-07 ENCOUNTER — Telehealth: Payer: Self-pay | Admitting: Pulmonary Disease

## 2020-12-07 MED ORDER — TRELEGY ELLIPTA 100-62.5-25 MCG/INH IN AEPB: 1.0000 | INHALATION_SPRAY | Freq: Every day | RESPIRATORY_TRACT | 5 refills | Status: DC

## 2020-12-07 NOTE — Telephone Encounter (Signed)
Received call from [facility] requesting a new prescription of Trelegy. Trelegy refill sent to [facility]. Nothing further at this time.

## 2020-12-26 DIAGNOSIS — E039 Hypothyroidism, unspecified: Secondary | ICD-10-CM | POA: Diagnosis not present

## 2020-12-26 DIAGNOSIS — I10 Essential (primary) hypertension: Secondary | ICD-10-CM | POA: Diagnosis not present

## 2020-12-26 DIAGNOSIS — E7800 Pure hypercholesterolemia, unspecified: Secondary | ICD-10-CM | POA: Diagnosis not present

## 2021-01-10 DIAGNOSIS — E039 Hypothyroidism, unspecified: Secondary | ICD-10-CM | POA: Diagnosis not present

## 2021-01-10 DIAGNOSIS — I10 Essential (primary) hypertension: Secondary | ICD-10-CM | POA: Diagnosis not present

## 2021-01-11 DIAGNOSIS — F339 Major depressive disorder, recurrent, unspecified: Secondary | ICD-10-CM | POA: Diagnosis not present

## 2021-01-11 DIAGNOSIS — N400 Enlarged prostate without lower urinary tract symptoms: Secondary | ICD-10-CM | POA: Diagnosis not present

## 2021-01-14 ENCOUNTER — Ambulatory Visit (INDEPENDENT_AMBULATORY_CARE_PROVIDER_SITE_OTHER): Payer: Medicare Other | Admitting: Pulmonary Disease

## 2021-01-14 ENCOUNTER — Encounter: Payer: Self-pay | Admitting: Pulmonary Disease

## 2021-01-14 ENCOUNTER — Other Ambulatory Visit: Payer: Self-pay

## 2021-01-14 VITALS — BP 126/68 | HR 78 | Temp 97.7°F | Ht 72.0 in | Wt 169.6 lb

## 2021-01-14 DIAGNOSIS — J449 Chronic obstructive pulmonary disease, unspecified: Secondary | ICD-10-CM | POA: Diagnosis not present

## 2021-01-14 LAB — PULMONARY FUNCTION TEST
DL/VA % pred: 82 %
DL/VA: 3.17 ml/min/mmHg/L
DLCO cor % pred: 57 %
DLCO cor: 14.78 ml/min/mmHg
DLCO unc % pred: 57 %
DLCO unc: 14.78 ml/min/mmHg
FEF 25-75 Post: 1.58 L/sec
FEF 25-75 Pre: 1.14 L/sec
FEF2575-%Change-Post: 38 %
FEF2575-%Pred-Post: 72 %
FEF2575-%Pred-Pre: 52 %
FEV1-%Change-Post: 7 %
FEV1-%Pred-Post: 63 %
FEV1-%Pred-Pre: 59 %
FEV1-Post: 2 L
FEV1-Pre: 1.86 L
FEV1FVC-%Change-Post: 1 %
FEV1FVC-%Pred-Pre: 99 %
FEV6-%Change-Post: 4 %
FEV6-%Pred-Post: 66 %
FEV6-%Pred-Pre: 63 %
FEV6-Post: 2.74 L
FEV6-Pre: 2.61 L
FEV6FVC-%Change-Post: -1 %
FEV6FVC-%Pred-Post: 105 %
FEV6FVC-%Pred-Pre: 106 %
FVC-%Change-Post: 5 %
FVC-%Pred-Post: 63 %
FVC-%Pred-Pre: 59 %
FVC-Post: 2.77 L
Post FEV1/FVC ratio: 72 %
Post FEV6/FVC ratio: 99 %
Pre FEV1/FVC ratio: 71 %
Pre FEV6/FVC Ratio: 100 %
RV % pred: 97 %
RV: 2.7 L
TLC % pred: 70 %
TLC: 5.26 L

## 2021-01-14 NOTE — Progress Notes (Signed)
PFT done today. 

## 2021-01-14 NOTE — Patient Instructions (Signed)
I have reviewed your PFTs which show stable lung function Continue Trelegy inhaler Continue exercise regimen Follow-up in 1 year.

## 2021-01-14 NOTE — Progress Notes (Signed)
Timothy Spencer    [id number]    1941/04/23  Primary Care Physician:Pharr, Zollie Beckers, MD  Referring Physician: Merri Brunette, MD [address] [place],  [place] [address]  Chief complaint: Follow-up for dyspnea, emphysema  HPI: 80 year old with moderate COPD, hypertension, hypothyroidism, restless leg syndrome, coronary artery disease.  Seen at [facility] pulmonary for evaluation of dyspnea in 2017.  He had a cardiopulmonary exercise test done there which showed deconditioning.  Also has history of right bundle branch block, coronary artery disease and follows with cardiology on a regular basis. He has enrolled in an exercise program and works out 4 times a week.  Reports marked improvement in symptoms of dyspnea.  Interim history: Continues to be active exercising on treadmill and stationary cycle Continues on Trelegy inhaler Has stable dyspnea on exertion  Outpatient Encounter Medications as of 01/14/2021  Medication Sig   amLODipine (NORVASC) 2.5 MG tablet Take 1 tablet by mouth daily.   aspirin EC 81 MG tablet Take 81 mg by mouth daily.   Fluticasone-Umeclidin-Vilant (TRELEGY ELLIPTA) 100-62.5-25 MCG/INH AEPB Inhale 1 puff into the lungs daily.   isosorbide mononitrate (IMDUR) 60 MG 24 hr tablet TAKE 1 AND 1/2 TABLETS(90 MG) BY MOUTH DAILY   levothyroxine (SYNTHROID) 112 MCG tablet Take 112 mcg by mouth daily.   rOPINIRole (REQUIP) 1 MG tablet Take 1 mg by mouth 2 (two) times daily.    rosuvastatin (CRESTOR) 5 MG tablet Take 5 mg by mouth every other day.   sertraline (ZOLOFT) 100 MG tablet Take 100 mg by mouth daily.    tamsulosin (FLOMAX) 0.4 MG CAPS capsule Take 0.4 mg by mouth daily.    No facility-administered encounter medications on file as of 01/14/2021.   Physical Exam: Blood pressure 126/68, pulse 78, temperature 97.7 F (36.5 C), temperature source Oral, height 6' (1.829 m), weight 169 lb 9.6 oz (76.9 kg), SpO2 98 %. Gen:      No acute  distress HEENT:  EOMI, sclera anicteric Neck:     No masses; no thyromegaly Lungs:    Clear to auscultation bilaterally; normal respiratory effort CV:         Regular rate and rhythm; no murmurs Abd:      + bowel sounds; soft, non-tender; no palpable masses, no distension Ext:    No edema; adequate peripheral perfusion Skin:      Warm and dry; no rash Neuro: alert and oriented x 3 Psych: normal mood and affect   Data Reviewed: PFTs  06/26/2018 FVC 2.93 [55%], FEV1 2.03 [60%], F/F 69, TLC 5.54 (10 4%), DLCO 16.26 (46%)  01/14/2021 FVC 2.77 (63%], FEV1 2.00 [63%], F/F 72, TLC 5.26 [90%], DLCO 14.78 [10%] Mild restriction, severe diffusion defect  04/11/17:  Walked 312 Meters / Baseline Sat 100% on RA / Nadir Sat 98% on RA   Imaging CT chest 10/11/16- 4 mm left lower lobe nodule.  Previously noted lower lobe opacities have resolved.  Centrilobular emphysema.  Pleural thickening versus small effusion on the left CT chest 07/13/16-6 mm left lower lobe nodule.  9 mm left lower lobe nodule.  4 mm left lower lobe nodule.   Questionable pleural thickening versus pleural effusion CT 10/12/2017- Emphysema, stable pulmonary nodules.  Pleural-parenchymal scarring at the right hemithorax. Chest x-ray 06/26/18-chronic right hemidiaphragm elevation with mild atelectasis. I have reviewed the images personally.  ESOPHAGRAM/BARIUM SWALLOW 09/10/15:  Small hiatal hernia with mild stricture at the gastroesophageal junction. Negative for  reflux.   CARDIAC TTE (12/15/15):  LV normal in size with EF 60-65%. Impaired diastolic function of LV. LA & RA normal in size. RV normal in size & function. No aortic stenosis or regurgitation. Trace mitral regurgitation. Trace tricuspid regurgitation. Normal aortic root. No pericardial effusion. Trace pulmonic regurgitation.    LABS Alpha-1 antitrypsin 03/06/2017-159, MS phenotype Alpha-1 antitrypsin 06/06/2018-156  CBC 06/26/2018-WBC 7.4, eos 2%, absolute eosinophil  count 148 IgE 149  01/29/09 ANA:  Negative RA:  79  Cardio pulmonary Exercise stress test 2017 from [facility] This cardiopulmonary exercise test demonstrated a decreased exercise capacity. The patient did not demonstrate a pulmonary limitation. Given the patient's anaerobic threshold > 40% with increased heart rate reserve, poor effort or deconditioning can account for the patient's symptoms. However, the patient did display a reduced oxygen pulse which is an indirect measure of stroke volume which may indicate early cardiovascular disease. Please note that the patient's baseline EKG displayed a RBBB limiting an evaluation of ST segment alterations during the procedure.  Assessment:  Emphysema No significant obstruction on PFTs Continue on Trelegy inhaler. Continue exercise regimen at home  He is asking about endobronchial valve.  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  Last checked alpha-1 antitrypsin levels are normal.  We will continue to monitor.  Left lower lobe pulmonary nodule. Continue annual screening.  Health maintenance 03/02/2019-influenza Patient states that he is up-to-date with his pneumonia vaccine at his primary care. He is also vaccinated against Covid  Plan/Recommendations: - Continue Trelegy - Exercise program  Chilton Greathouse MD [facility] 01/14/2021, 1:49 PM  CC: Merri Brunette, MD

## 2021-01-26 DIAGNOSIS — E7800 Pure hypercholesterolemia, unspecified: Secondary | ICD-10-CM | POA: Diagnosis not present

## 2021-01-26 DIAGNOSIS — I10 Essential (primary) hypertension: Secondary | ICD-10-CM | POA: Diagnosis not present

## 2021-01-26 DIAGNOSIS — E039 Hypothyroidism, unspecified: Secondary | ICD-10-CM | POA: Diagnosis not present

## 2021-01-27 DIAGNOSIS — H353221 Exudative age-related macular degeneration, left eye, with active choroidal neovascularization: Secondary | ICD-10-CM | POA: Diagnosis not present

## 2021-01-27 DIAGNOSIS — H43813 Vitreous degeneration, bilateral: Secondary | ICD-10-CM | POA: Diagnosis not present

## 2021-01-27 DIAGNOSIS — H353114 Nonexudative age-related macular degeneration, right eye, advanced atrophic with subfoveal involvement: Secondary | ICD-10-CM | POA: Diagnosis not present

## 2021-01-27 DIAGNOSIS — N400 Enlarged prostate without lower urinary tract symptoms: Secondary | ICD-10-CM | POA: Diagnosis not present

## 2021-01-27 DIAGNOSIS — H35371 Puckering of macula, right eye: Secondary | ICD-10-CM | POA: Diagnosis not present

## 2021-02-25 DIAGNOSIS — E039 Hypothyroidism, unspecified: Secondary | ICD-10-CM | POA: Diagnosis not present

## 2021-02-25 DIAGNOSIS — I10 Essential (primary) hypertension: Secondary | ICD-10-CM | POA: Diagnosis not present

## 2021-02-25 DIAGNOSIS — E7800 Pure hypercholesterolemia, unspecified: Secondary | ICD-10-CM | POA: Diagnosis not present

## 2021-03-09 DIAGNOSIS — I6523 Occlusion and stenosis of bilateral carotid arteries: Secondary | ICD-10-CM | POA: Diagnosis not present

## 2021-03-09 DIAGNOSIS — E7800 Pure hypercholesterolemia, unspecified: Secondary | ICD-10-CM | POA: Diagnosis not present

## 2021-03-09 DIAGNOSIS — Z0000 Encounter for general adult medical examination without abnormal findings: Secondary | ICD-10-CM | POA: Diagnosis not present

## 2021-03-09 DIAGNOSIS — I10 Essential (primary) hypertension: Secondary | ICD-10-CM | POA: Diagnosis not present

## 2021-04-07 ENCOUNTER — Other Ambulatory Visit: Payer: Self-pay | Admitting: Pulmonary Disease

## 2021-04-14 DIAGNOSIS — H353221 Exudative age-related macular degeneration, left eye, with active choroidal neovascularization: Secondary | ICD-10-CM | POA: Diagnosis not present

## 2021-04-14 DIAGNOSIS — H353114 Nonexudative age-related macular degeneration, right eye, advanced atrophic with subfoveal involvement: Secondary | ICD-10-CM | POA: Diagnosis not present

## 2021-04-14 DIAGNOSIS — H43822 Vitreomacular adhesion, left eye: Secondary | ICD-10-CM | POA: Diagnosis not present

## 2021-04-14 DIAGNOSIS — H35371 Puckering of macula, right eye: Secondary | ICD-10-CM | POA: Diagnosis not present

## 2021-05-11 DIAGNOSIS — E7801 Familial hypercholesterolemia: Secondary | ICD-10-CM | POA: Diagnosis not present

## 2021-05-16 ENCOUNTER — Telehealth: Payer: Self-pay | Admitting: Pulmonary Disease

## 2021-05-17 NOTE — Telephone Encounter (Signed)
I have called and spoke with pt and he stated that he uses the trelegy and he does not have a rescue inhaler and feels that he is in need of this.  He stated that when he goes up the stairs he gets [facility] and he feels that the inhaler would help with this.  PM please advise. thanks

## 2021-05-18 MED ORDER — ALBUTEROL SULFATE HFA 108 (90 BASE) MCG/ACT IN AERS: 2.0000 | INHALATION_SPRAY | Freq: Four times a day (QID) | RESPIRATORY_TRACT | 6 refills | Status: DC | PRN

## 2021-05-18 NOTE — Telephone Encounter (Signed)
Tried to call pt back but still unable to reach. Left pt detailed message that we were sending albuterol inhaler to pharmacy for him. Rx has been sent . Nothing further needed.

## 2021-05-18 NOTE — Telephone Encounter (Signed)
Attempted to call pt but unable to reach. Left message for him to return call. °

## 2021-05-18 NOTE — Telephone Encounter (Signed)
Ok to order albuterol as needed

## 2021-06-23 DIAGNOSIS — H353221 Exudative age-related macular degeneration, left eye, with active choroidal neovascularization: Secondary | ICD-10-CM | POA: Diagnosis not present

## 2021-06-23 DIAGNOSIS — H35371 Puckering of macula, right eye: Secondary | ICD-10-CM | POA: Diagnosis not present

## 2021-06-23 DIAGNOSIS — H353114 Nonexudative age-related macular degeneration, right eye, advanced atrophic with subfoveal involvement: Secondary | ICD-10-CM | POA: Diagnosis not present

## 2021-06-23 DIAGNOSIS — H43822 Vitreomacular adhesion, left eye: Secondary | ICD-10-CM | POA: Diagnosis not present

## 2021-07-18 DIAGNOSIS — Z961 Presence of intraocular lens: Secondary | ICD-10-CM | POA: Diagnosis not present

## 2021-07-18 DIAGNOSIS — H40053 Ocular hypertension, bilateral: Secondary | ICD-10-CM | POA: Diagnosis not present

## 2021-08-10 ENCOUNTER — Encounter: Payer: Self-pay | Admitting: Pulmonary Disease

## 2021-08-10 ENCOUNTER — Ambulatory Visit: Payer: Medicare Other | Admitting: Pulmonary Disease

## 2021-08-10 ENCOUNTER — Other Ambulatory Visit: Payer: Self-pay

## 2021-08-10 ENCOUNTER — Ambulatory Visit (INDEPENDENT_AMBULATORY_CARE_PROVIDER_SITE_OTHER): Payer: Medicare Other

## 2021-08-10 VITALS — BP 132/74 | HR 100 | Temp 97.9°F | Ht 71.0 in | Wt 178.8 lb

## 2021-08-10 DIAGNOSIS — J449 Chronic obstructive pulmonary disease, unspecified: Secondary | ICD-10-CM | POA: Diagnosis not present

## 2021-08-10 DIAGNOSIS — R0602 Shortness of breath: Secondary | ICD-10-CM | POA: Diagnosis not present

## 2021-08-10 MED ORDER — ALBUTEROL SULFATE HFA 108 (90 BASE) MCG/ACT IN AERS: 2.0000 | INHALATION_SPRAY | Freq: Four times a day (QID) | RESPIRATORY_TRACT | 6 refills | Status: DC | PRN

## 2021-08-10 NOTE — Patient Instructions (Addendum)
We will renew the albuterol ?Continue Trelegy ?Check overnight oximetry test to see if he qualifies for supplemental oxygen ?We will get a chest x-ray today, check CBC and alpha-1 antitrypsin levels ?Schedule PFTs and follow-up in 3 months. ? ?

## 2021-08-10 NOTE — Progress Notes (Signed)
? ?      ?Timothy Spencer    [id number]    [birth year] ? ?Primary Care Physician:Pharr, Zollie Beckers, MD ? ?Referring Physician: Merri Brunette, MD ?[address] ?,  [place] [address] ? ?Chief complaint: Follow-up for dyspnea, emphysema ? ?HPI: ?81 year old with moderate COPD, hypertension, hypothyroidism, restless leg syndrome, coronary artery disease.  Seen at [facility] pulmonary for evaluation of dyspnea in 2017.  He had a cardiopulmonary exercise test done there which showed deconditioning.  Also has history of right bundle branch block, coronary artery disease and follows with cardiology on a regular basis. ?He has enrolled in an exercise program and works out 4 times a week.  Reports marked improvement in symptoms of dyspnea. ? ?Over 100 pack year of smoking in Quit 2015 ? ?Interim history: ?Continues to be active exercising on treadmill and stationary cycle ?Continues on Trelegy inhaler ?Notes worsening dyspnea over the past few months.  Denies any cough, wheezing ? ?Outpatient Encounter Medications as of 08/10/2021  ?Medication Sig  ? albuterol (VENTOLIN HFA) 108 (90 Base) MCG/ACT inhaler Inhale 2 puffs into the lungs every 6 (six) hours as needed for wheezing or shortness of breath.  ? amLODipine (NORVASC) 2.5 MG tablet Take 1 tablet by mouth daily.  ? aspirin EC 81 MG tablet Take 81 mg by mouth daily.  ? isosorbide mononitrate (IMDUR) 60 MG 24 hr tablet TAKE 1 AND 1/2 TABLETS(90 MG) BY MOUTH DAILY  ? levothyroxine (SYNTHROID) 112 MCG tablet Take 112 mcg by mouth daily.  ? rOPINIRole (REQUIP) 1 MG tablet Take 1 mg by mouth 2 (two) times daily.   ? rosuvastatin (CRESTOR) 5 MG tablet Take 5 mg by mouth every other day.  ? sertraline (ZOLOFT) 100 MG tablet Take 100 mg by mouth daily.   ? tamsulosin (FLOMAX) 0.4 MG CAPS capsule Take 0.4 mg by mouth daily.   ? TRELEGY ELLIPTA 100-62.5-25 MCG/ACT AEPB INHALE 1 PUFF INTO THE LUNGS DAILY  ? ?No facility-administered encounter medications on file  as of 08/10/2021.  ? ?Physical Exam: ?Blood pressure 132/74, pulse 100, temperature 97.9 ?F (36.6 ?C), temperature source Oral, height 5\' 11"  (1.803 m), weight 178 lb 12.8 oz (81.1 kg), SpO2 96 %. ?Gen:      No acute distress ?HEENT:  EOMI, sclera anicteric ?Neck:     No masses; no thyromegaly ?Lungs:    Clear to auscultation bilaterally; normal respiratory effort ?CV:         Regular rate and rhythm; no murmurs ?Abd:      + bowel sounds; soft, non-tender; no palpable masses, no distension ?Ext:    No edema; adequate peripheral perfusion ?Skin:      Warm and dry; no rash ?Neuro: alert and oriented x 3 ?Psych: normal mood and affect  ? ?Data Reviewed: ?PFTs  ?06/26/2018 ?FVC 2.93 [55%], FEV1 2.03 [60%], F/F 69, TLC 5.54 (10 4%), DLCO 16.26 (46%) ? ?01/14/2021 ?FVC 2.77 (63%], FEV1 2.00 [63%], F/F 72, TLC 5.26 [90%], DLCO 14.78 [10%] ?Mild restriction, severe diffusion defect ? ?01/16/2021 ?04/11/17:  Walked 312 Meters / Baseline Sat 100% on RA / Nadir Sat 98% on RA ?  ?Imaging ?CT chest 10/11/16- 4 mm left lower lobe nodule.  Previously noted lower lobe opacities have resolved.  Centrilobular emphysema.  Pleural thickening versus small effusion on the left ?CT chest 07/13/16-6 mm left lower lobe nodule.  9 mm left lower lobe nodule.  4 mm left lower lobe nodule.  ? Questionable pleural thickening versus pleural  effusion ?CT 10/12/2017- Emphysema, stable pulmonary nodules.  Pleural-parenchymal scarring at the right hemithorax. ?Chest x-ray 06/26/18-chronic right hemidiaphragm elevation with mild atelectasis. ?I have reviewed the images personally. ? ?ESOPHAGRAM/BARIUM SWALLOW 09/10/15:  Small hiatal hernia with mild stricture at the gastroesophageal junction. Negative for reflux. ?  ?CARDIAC ?TTE (12/15/15):  LV normal in size with EF 60-65%. Impaired diastolic function of LV. LA & RA normal in size. RV normal in size & function. No aortic stenosis or regurgitation. Trace mitral regurgitation. Trace tricuspid regurgitation. Normal  aortic root. No pericardial effusion. Trace pulmonic regurgitation.  ?  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 ?CBC 06/26/2018-WBC 7.4, eos 2%, absolute eosinophil count 148 ?IgE 149 ? ?01/29/09 ?ANA:  Negative ?RA:  79 ? ?Cardio pulmonary Exercise stress test 2017 from [facility] ?This cardiopulmonary exercise test demonstrated a decreased exercise capacity. The patient did not demonstrate a pulmonary limitation. Given the patient's anaerobic threshold > 40% with increased heart rate reserve, poor effort or deconditioning can account for the patient's symptoms. However, the patient did display a reduced oxygen pulse which is an indirect measure of stroke volume which may indicate early cardiovascular disease. Please note that the patient's baseline EKG displayed a RBBB limiting an evaluation of ST segment alterations during the procedure. ? ?Assessment:  ?Emphysema ?No significant obstruction on PFTs ?Continue on Trelegy inhaler. ?Continue exercise regimen at home ?He is asking about endobronchial valve.  He is not a candidate for this as there is no hyperinflation on PFTs ? ?He is complaining of increased dyspnea on exertion.  He did not desat on forced walk today ?We will check overnight oximetry to see if he has desaturations at night ?Recheck CBC, alpha-1 antitrypsin levels, chest x-ray and PFTs ?  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 recheck alpha-1 antitrypsin level ? ?Left lower lobe pulmonary nodule. ?Has remained stable on follow-up scans.  He is past the age cutoff to continue low-dose screening CT ?Review chest x-ray ? ?Health maintenance ?03/02/2019-influenza ?Patient states that he is up-to-date with his pneumonia vaccine at his primary care. ?He is also vaccinated against Covid ? ?Plan/Recommendations: ?- Continue Trelegy ?- Overnight oximetry ?- Labs ?- Chest x-ray, PFT ? ?Chilton Greathouse MD ?[facility] ?08/10/2021, 2:41 PM ? ?CC: Merri Brunette, MD ? ? ?

## 2021-08-11 LAB — CBC WITH DIFFERENTIAL/PLATELET
Basophils Absolute: 0.1 10*3/uL (ref 0.0–0.1)
Basophils Relative: 0.7 % (ref 0.0–3.0)
Eosinophils Absolute: 0.2 10*3/uL (ref 0.0–0.7)
Eosinophils Relative: 2.2 % (ref 0.0–5.0)
HCT: 44.8 % (ref 39.0–52.0)
Hemoglobin: 15.1 g/dL (ref 13.0–17.0)
Lymphocytes Relative: 23.4 % (ref 12.0–46.0)
Lymphs Abs: 1.8 10*3/uL (ref 0.7–4.0)
MCHC: 33.6 g/dL (ref 30.0–36.0)
MCV: 96.8 fl (ref 78.0–100.0)
Monocytes Absolute: 0.5 10*3/uL (ref 0.1–1.0)
Monocytes Relative: 6.4 % (ref 3.0–12.0)
Neutro Abs: 5.1 10*3/uL (ref 1.4–7.7)
Neutrophils Relative %: 67.3 % (ref 43.0–77.0)
Platelets: 143 10*3/uL — ABNORMAL LOW (ref 150.0–400.0)
RBC: 4.63 Mil/uL (ref 4.22–5.81)
RDW: 13.5 % (ref 11.5–15.5)
WBC: 7.6 10*3/uL (ref 4.0–10.5)

## 2021-08-16 ENCOUNTER — Other Ambulatory Visit: Payer: Self-pay | Admitting: *Deleted

## 2021-08-16 DIAGNOSIS — J449 Chronic obstructive pulmonary disease, unspecified: Secondary | ICD-10-CM | POA: Diagnosis not present

## 2021-08-16 DIAGNOSIS — J8410 Pulmonary fibrosis, unspecified: Secondary | ICD-10-CM

## 2021-08-17 ENCOUNTER — Telehealth: Payer: Self-pay | Admitting: Pulmonary Disease

## 2021-08-17 NOTE — Telephone Encounter (Signed)
ONO results received from [facility] via fax. Results given to Dr. Isaiah Serge for receive.  ? ?Message routed to Dr. Isaiah Serge ?

## 2021-08-18 NOTE — Telephone Encounter (Signed)
Reviewed overnight oximetry dated 08/16/2021 ?Duration of study 9 hours 30 minutes ?Nadir O2 sat of 99%.   ?Time spent less than 88% - 0 minutes ? ?Please let him know that his test does not show any desaturations at night and he does not need supplemental oxygen also his labs to date are normal. ? ?

## 2021-08-18 NOTE — Telephone Encounter (Signed)
Called and spoke with patient.  Dr. Mannam's results and recommendations given. Understanding stated.  Nothing further at this time. 

## 2021-08-19 LAB — ALPHA-1 ANTITRYPSIN PHENOTYPE: A-1 Antitrypsin, Ser: 148 mg/dL (ref 83–199)

## 2021-08-29 DIAGNOSIS — I10 Essential (primary) hypertension: Secondary | ICD-10-CM | POA: Diagnosis not present

## 2021-08-29 DIAGNOSIS — I6523 Occlusion and stenosis of bilateral carotid arteries: Secondary | ICD-10-CM | POA: Diagnosis not present

## 2021-09-07 ENCOUNTER — Ambulatory Visit
Admission: RE | Admit: 2021-09-07 | Discharge: 2021-09-07 | Disposition: A | Discharge status: Home / Self Care | Payer: Medicare Other | Source: Ambulatory Visit | Attending: Pulmonary Disease | Admitting: Pulmonary Disease

## 2021-09-07 DIAGNOSIS — J432 Centrilobular emphysema: Secondary | ICD-10-CM | POA: Diagnosis not present

## 2021-09-07 DIAGNOSIS — I700 Atherosclerosis of aorta: Secondary | ICD-10-CM | POA: Diagnosis not present

## 2021-09-07 DIAGNOSIS — J479 Bronchiectasis, uncomplicated: Secondary | ICD-10-CM | POA: Diagnosis not present

## 2021-09-07 DIAGNOSIS — J8410 Pulmonary fibrosis, unspecified: Secondary | ICD-10-CM

## 2021-09-07 DIAGNOSIS — I2510 Atherosclerotic heart disease of native coronary artery without angina pectoris: Secondary | ICD-10-CM | POA: Diagnosis not present

## 2021-09-08 DIAGNOSIS — H353114 Nonexudative age-related macular degeneration, right eye, advanced atrophic with subfoveal involvement: Secondary | ICD-10-CM | POA: Diagnosis not present

## 2021-09-08 DIAGNOSIS — H43822 Vitreomacular adhesion, left eye: Secondary | ICD-10-CM | POA: Diagnosis not present

## 2021-09-08 DIAGNOSIS — H353221 Exudative age-related macular degeneration, left eye, with active choroidal neovascularization: Secondary | ICD-10-CM | POA: Diagnosis not present

## 2021-09-08 DIAGNOSIS — H35371 Puckering of macula, right eye: Secondary | ICD-10-CM | POA: Diagnosis not present

## 2021-09-15 DIAGNOSIS — I10 Essential (primary) hypertension: Secondary | ICD-10-CM | POA: Diagnosis not present

## 2021-09-26 ENCOUNTER — Telehealth: Payer: Self-pay | Admitting: Pulmonary Disease

## 2021-09-26 NOTE — Telephone Encounter (Signed)
Called and spoke with patient this morningto review CT results.  Patient stated he has been checking his O2 sats often and he is always at 96% on room air.  Patient wanted Dr. Vaughan Browner to advise if supplemental O2 would help with his shortness of breath?  Advised patient O2 sats at 96% on room air is good.  Explained to patient O2 qualifying and sats 88% or lower on room air to qualify.  Patient stated he felt O2 may help and wanted Dr. Matilde Bash advice. ? ? ?Message routed to Dr. Vaughan Browner to advise ?

## 2021-09-29 DIAGNOSIS — E7801 Familial hypercholesterolemia: Secondary | ICD-10-CM | POA: Diagnosis not present

## 2021-09-29 NOTE — Telephone Encounter (Signed)
Insurance will not cover since he does not qualify. Supplemental O2 is not needed medically but may help with his symptoms.  ? ?We can order a portable concentrator if he is willing to pay out of pocket ?

## 2021-09-29 NOTE — Telephone Encounter (Signed)
Called and spoke with pt letting him know the info per Dr. Isaiah Serge and he verbalized understanding. Pt stated he would hold off on this time with having portable concentrator ordered. Nothing further needed. ?

## 2021-10-06 ENCOUNTER — Other Ambulatory Visit: Payer: Self-pay | Admitting: Pulmonary Disease

## 2021-10-06 DIAGNOSIS — I10 Essential (primary) hypertension: Secondary | ICD-10-CM | POA: Diagnosis not present

## 2021-10-12 ENCOUNTER — Encounter: Payer: Self-pay | Admitting: Cardiovascular Disease

## 2021-10-12 ENCOUNTER — Ambulatory Visit: Payer: Medicare Other | Admitting: Cardiovascular Disease

## 2021-10-12 VITALS — BP 135/70 | HR 71 | Ht 71.0 in | Wt 175.0 lb

## 2021-10-12 DIAGNOSIS — I2510 Atherosclerotic heart disease of native coronary artery without angina pectoris: Secondary | ICD-10-CM | POA: Diagnosis not present

## 2021-10-12 DIAGNOSIS — I452 Bifascicular block: Secondary | ICD-10-CM

## 2021-10-12 DIAGNOSIS — I440 Atrioventricular block, first degree: Secondary | ICD-10-CM | POA: Diagnosis not present

## 2021-10-12 DIAGNOSIS — R0609 Other forms of dyspnea: Secondary | ICD-10-CM | POA: Diagnosis not present

## 2021-10-12 DIAGNOSIS — J439 Emphysema, unspecified: Secondary | ICD-10-CM

## 2021-10-12 DIAGNOSIS — Z8659 Personal history of other mental and behavioral disorders: Secondary | ICD-10-CM

## 2021-10-12 DIAGNOSIS — E785 Hyperlipidemia, unspecified: Secondary | ICD-10-CM

## 2021-10-12 DIAGNOSIS — E039 Hypothyroidism, unspecified: Secondary | ICD-10-CM

## 2021-10-12 NOTE — Progress Notes (Signed)
Patient ID: Timothy Spencer, male   DOB: Nov 10, 1940, 81 y.o.   MRN: [id number]      HPI: Timothy Spencer is a 81 y.o. male who presents for an 71 month follow-up cardiology evaluation.    [name] has a previous long-standing history of tobacco abuse  but quit smoking in May 2012 after smoking for over 40 years. He had undergone cardiac catheterization initially in 2009 and subsequently in January 2012 which showed mild CAD with mid systolic bridging of his LAD which narrowed up to 95% during systole and was essentially normal during diastole, 50-60% circumflex stenosis, 30-40% mid AV groove stenosis and he had a nondominant right coronary artery. He  benefited with the addition of Ranexa to his medical regimen. A cardiopulmonary met test subsequently suggested an ischemic response. He has been exercising regularly. He no longer smokes cigarettes. He has chronic right bundle branch block.   When I saw him in May 2015  he had been taking off his metoprolol due to continued fatigability. He does have a history of hypothyroidism, and he has been on Synthroid 150 mcg.    Laboratory done by Dr. Shelia Media in April 2015 showed a hemoglobin of 14.2, hematocrit 42.0.  BUN 2621.5.  Lipid studies were excellent with a total cholesterol 119 triglycerides were slightly elevated at 179, HDL 44, calculated LDL 39.  Urinalysis was negative.   When I last saw him in 2016 he had noticed a significant change in his ability to exercise.  He has noticed a dramatic increase in exertional shortness of breath.  He underwent palmar a function studies by Dr. Shelia Media which revealed airway obstruction and a diffusion defect suggesting emphysema but the absence of overinflation was inconsistent with that diagnosis.  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  Because of his significant symptom change he underwent definitive  catheterization on 11/16/2014 which showed two-vessel disease with 50% mid circumflex stenosis, and there was a 90% mid distal LAD lesion secondary to dynamic systolic bridging which normalized during diastole.  Increased medical therapy was recommended.  He has been evaluated at [facility] in a pulmonary function studies.  He was seen in the office by Almyra Deforest, PA January 2018 with low blood pressure.    He was seen by Almyra Deforest in September 2018 after he was concerned that Crestor was causing increasing dyspnea on exertion.  His dyspnea improved after he stopped Crestor.  During that evaluation, he initiated pravastatin at 20 mg, which the patient has tolerated and denies any recurrent increasing shortness of breath.  He has emphysema.   I saw him in December 2018 at which time he denied any chest tightness or palpitations.  He now goes to the gym at least 4 days/week.  He denies chest pain PND orthopnea.  At times he has noticed some slight low blood pressure and mild dizziness if he stands abruptly.  He has continued to take isosorbide 90 mg, rosuvastatin 5 mg every other day levothyroxine 175 mcg.  He also is on an oral inhaler.  He is no longer on pravastatin.  He is no longer on Ranexa.  I saw him on July 24, 2018.  At that time, I recommended he reduce his isosorbide dose to 30 mg.  His ECG showed chronic right bundle branch block with first-degree AV block which was stable.   He was  evaluated  by me on September 02, 2019 in a telemedicine evaluation.  Over the prior year he continued to be evaluated by Dr. Vaughan Browner for his COPD/emphysema.  He was undergoing pulmonary rehabilitation which was stopped.  He has had issues with low back discomfort making it difficult for him to walk but he states he can get on a treadmill since he holds the railing and is able to tolerate doing this 4 days/week.  He was evaluated in the emergency room in early March 2021 with diverticulitis.  Presently his dizziness has  resolved with reduction of his isosorbide.  He denies any chest pain or awareness of palpitations.  He still sees Dr. Shelia Media and has not had recent laboratory.  He was on Trelegy for his lung disease and denied any awareness of significant wheezing.  He denied leg swelling.  I  saw him in June 2021.  Since his prior evaluation he had continued to be stable and denied any chest pain, PND, or orthopnea.  He did experience mild shortness of breath with activity.  He has COPD/emphysema.    He also notes his heart rate can increase with exercise fairly rapidly.  At home he has a treadmill and a leg machine. He had seen Dr. Deland Pretty who checked complete laboratory.  Hemoglobin hematocrit were stable at 14 0.3 and 41.1.  Renal function was normal with a BUN of 17 creatinine 1.22.  Potassium was 4.1.  LFTs were normal.  TSH 2.19.  PSA 2.0.  Lipid studies remained stable with total cholesterol 145, triglycerides 124, LDL 68 and HDL 55..    I last saw him on Oct 12, 2020 at which time he was remaining stable.  He was not having any anginal symptomatology.   He will be seeing Dr. Deland Pretty next month and will be undergoing a complete set of laboratory.  He denies chest pain or palpitations.  He experiences mild shortness of breath.  He takes Trelegy Ellipta for his emphysema.  He continues to be on aspirin 81 mg, isosorbide 90 mg daily and is without chest pain or anginal symptoms.  He is on low-dose rosuvastatin 5 mg every other day.  He continues to be on levothyroxine for hypothyroidism currently at 112 mcg daily.  He had stable right bundle branch block, left anterior hemiblock as well as first-degree AV block on his ECG and was asymptomatic without any presyncope or syncope and there was no need for pacemaker.  Since I last saw him, he has continued to do well.  He is exercising 5 days/week on a treadmill for approximately 20 minutes.  He does admit to mild shortness of breath with stair climbing.  He  continues to see Dr. Vaughan Browner of pulmonary for his COPD/emphysema and is scheduled for follow-up pulmonary function testing.  He has not had any recurrent anginal symptomatology on his current medical regimen.  A high-resolution chest CT on September 07, 2021 was unchanged and continues to show irregular peripheral interstitial opacity and septal thickening in the dependent left lung base with some evidence of traction bronchiectasis and subpleural bronchiolectasis.  He presents for yearly evaluation.   Past Medical History:  Diagnosis Date   Anxiety and depression    BPH (benign prostatic hyperplasia)    CAD (coronary artery disease)    COPD (chronic obstructive pulmonary disease) (HCC)    DOE (dyspnea on exertion)    ED (erectile dysfunction)    Emphysema of lung (HCC)    First degree AV block  GERD (gastroesophageal reflux disease)    Glaucoma    Gout    Gynecomastia    H/O ETOH abuse    Hemochromatosis    Hyperlipidemia    Hypertension    Hypothyroidism    Macular degeneration    OSA (obstructive sleep apnea)    Osteopenia    Peripheral neuropathy    RLS (restless legs syndrome)    Thrombocytopenia ([place])     Past Surgical History:  Procedure Laterality Date   CARDIAC CATHETERIZATION  03/27/2008   Medical therapy   CARDIAC CATHETERIZATION  06/03/2010   Medical theray and smoking cessation   CARDIAC CATHETERIZATION N/A 11/24/2014   Procedure: Left Heart Cath and Coronary Angiography;  Surgeon: Troy Sine, MD;  Location: [facility];  Service: Cardiovascular;  Laterality: N/A;   CARDIOPULMONARY EXERCISE TEST  02/12/2012   Peak VO2 63% of predicted   ORIF FEMUR FRACTURE Right    TRANSTHORACIC ECHOCARDIOGRAM  11/28/2011   EF >55%, normal    No Known Allergies  Current Outpatient Medications  Medication Sig Dispense Refill   albuterol (VENTOLIN HFA) 108 (90 Base) MCG/ACT inhaler Inhale 2 puffs into the lungs every 6 (six) hours as needed for wheezing or shortness of  breath. 8 g 6   amLODipine (NORVASC) 10 MG tablet Take 10 mg by mouth daily.     isosorbide mononitrate (IMDUR) 60 MG 24 hr tablet TAKE 1 AND 1/2 TABLETS(90 MG) BY MOUTH DAILY 135 tablet 3   levothyroxine (SYNTHROID) 112 MCG tablet Take 112 mcg by mouth daily.     rOPINIRole (REQUIP) 1 MG tablet Take 1 mg by mouth 2 (two) times daily.      rosuvastatin (CRESTOR) 5 MG tablet Take 5 mg by mouth every other day.     tamsulosin (FLOMAX) 0.4 MG CAPS capsule Take 0.4 mg by mouth daily.      TRELEGY ELLIPTA 100-62.5-25 MCG/ACT AEPB INHALE 1 PUFF INTO THE LUNGS DAILY 60 each 5   aspirin EC 81 MG tablet Take 81 mg by mouth daily. (Patient not taking: Reported on 10/12/2021)     buPROPion (WELLBUTRIN XL) 150 MG 24 hr tablet Take 150 mg by mouth every morning.     latanoprost (XALATAN) 0.005 % ophthalmic solution 1 drop at bedtime.     sertraline (ZOLOFT) 100 MG tablet Take 100 mg by mouth daily.  (Patient not taking: Reported on 10/12/2021)     No current facility-administered medications for this visit.    Social History   Socioeconomic History   Marital status: Married    Spouse name: Not on file   Number of children: Not on file   Years of education: Not on file   Highest education level: Not on file  Occupational History   Not on file  Tobacco Use   Smoking status: Former    Packs/day: 2.00    Years: 50.00    Pack years: 100.00    Types: Cigarettes    Start date: 04/11/1960    Quit date: 03/19/2011    Years since quitting: 10.6   Smokeless tobacco: Never   Tobacco comments:    Quit for 1 year total   Substance and Sexual Activity   Alcohol use: No    Alcohol/week: 0.0 standard drinks   Drug use: No   Sexual activity: Not on file  Other Topics Concern   Not on file  Social History Narrative   [facility] (03/06/17):   Originally from [place], [place]. [place] Has traveled to [place], [place], [place], [place], [place], & [place]. Has a dog currently. No bird  exposure. No mold or hot tub exposure. Previously enjoyed riding horses and playing golf. Currently has no hobbies. 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    Social Determinants of Health   Financial Resource Strain: Not on file  Food Insecurity: Not on file  Transportation Needs: Not on file  Physical Activity: Not on file  Stress: Not on file  Social Connections: Not on file  Intimate Partner Violence: Not on file    Family History  Adopted: Yes   Additional social history is notable in that he is married has 2 children. He does walk. He quit smoking in 2012. His alcohol use.  ROS General: Positive for mild fatigue; No fevers, chills, or night sweats;  HEENT: Negative; No changes in vision or hearing, sinus congestion, difficulty swallowing Pulmonary:  Positive for  emphysema.  Shortness of breath Cardiovascular:  See history of present illness GI: Positive for constipation and difficult to wipe his stool; No nausea, vomiting, diarrhea, or abdominal pain GU: Negative; No dysuria, hematuria, or difficulty voiding Musculoskeletal: Negative; no myalgias, joint pain, or weakness Hematologic/Oncology: Negative; no easy bruising, bleeding Endocrine: Positive for hypothyroidism , on Synthroid replacement; no heat/cold intolerance; no diabetes Neuro: Negative; no changes in balance, headaches Skin: Negative; No rashes or skin lesions Psychiatric: Negative; No behavioral problems, depression Sleep: Negative; No snoring, daytime sleepiness, hypersomnolence, bruxism, restless legs, hypnogognic hallucinations, no cataplexy Other comprehensive 14 point system review is negative.   PE BP 135/70   Pulse 71   Ht $R'5\' 11"'sX$  (1.803 m)   Wt 175 lb (79.4 kg)   SpO2 96%   BMI 24.41 kg/m    Repeat blood pressure by me was 120/68  Wt Readings from Last 3 Encounters:  10/12/21 175 lb (79.4 kg)  08/10/21 178 lb 12.8 oz (81.1 kg)  01/14/21 169  lb 9.6 oz (76.9 kg)    General: Alert, oriented, no distress.  Skin: normal turgor, no rashes, warm and dry HEENT: Normocephalic, atraumatic. Pupils equal round and reactive to light; sclera anicteric; extraocular muscles intact;  Nose without nasal septal hypertrophy Mouth/Parynx benign; Mallinpatti scale 3 Neck: No JVD, no carotid bruits; normal carotid upstroke Lungs: Decreased breath sounds, no wheezing Chest wall: without tenderness to palpitation Heart: PMI not displaced, RRR, s1 s2 normal, 1/6 systolic murmur, no diastolic murmur, no rubs, gallops, thrills, or heaves Abdomen: soft, nontender; no hepatosplenomehaly, BS+; abdominal aorta nontender and not dilated by palpation. Back: no CVA tenderness Pulses 2+ Musculoskeletal: full range of motion, normal strength, no joint deformities Extremities: no clubbing cyanosis or edema, Homan's sign negative  Neurologic: grossly nonfocal; Cranial nerves grossly wnl Psychologic: Normal mood and affect  Oct 12, 2021 ECG (independently read by me): Sinus rhythm at 71, RBBB, LAHB, first-degree AV block PR interval 256 ms  Oct 12, 2020 ECG (independently read by me): Sinus rhythm at 62 bpm with first-degree AV block (PR interval 260 ms), right bundle branch block with repolarization changes, and left anterior fascicular block.  No ectopy.  QTc interval 477 ms  June 2021 ECG (independently read by me): Sinus rhythm with first-degree AV block.  Heart rate 96 bpm.  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  QTc interval 447 ms.  July 24, 2018 ECG (independently read by me): Sinus bradycardia 59 bpm, first-degree AV  block with PR 244 ms.  Right bundle branch block with repolarization changes, left anterior hemiblock.  December 2018 ECG (independently read by me): Normal sinus rhythm at 60 bpm with first-degree AV block.  Right bundle branch block with repolarization changes.  Possible old inferior  infarct.  PR interval 254 ms.  QTc interval 440 ms.  May 2018 ECG (independently read by me): Normal sinus rhythm with first-degree AV block at 61 bpm.  PR interval 260 ms.  Right bundle-branch block with repolarization changes.  June 2016 ECG (independently read by me):  Sinus rhythm with first-degree AV block.  Right bundle-branch block.  PR interval 266 ms. inferior infarct.  February 2016ECG (independently read by me): Sinus rhythm at 64 bpm. ,  First-degree AV block.  Right bundle-branch block with repolarization changes.  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  The computer was incorrect and reading this as an undetermined rhythm.  May 2015 ECG  normal sinus rhythm at 62 beats per minute.  Right bundle branch block with repolarization changes.  First-degree AV block with PR interval of 252 ms.  Prior October 2014 ECG: Sinus rhythm with right bundle branch block with first-degree AV block.  LABS:     Latest Ref Rng & Units 08/04/2019    6:28 PM 06/19/2016    3:47 PM 08/13/2015    3:29 PM  BMP  Glucose 70 - 99 mg/dL 93   88   93    BUN 8 - 23 mg/dL $Remove'23   23   26    'eEUkrGV$ Creatinine 0.61 - 1.24 mg/dL 1.38   1.44   1.43    Sodium 135 - 145 mmol/L 141   141   141    Potassium 3.5 - 5.1 mmol/L 4.1   3.8   4.0    Chloride 98 - 111 mmol/L 108   107   106    CO2 22 - 32 mmol/L $RemoveB'25   21   23    'ZsSWNRHl$ Calcium 8.9 - 10.3 mg/dL 9.4   9.4   9.6        Latest Ref Rng & Units 08/04/2019    6:28 PM 11/20/2014    2:13 PM 12/06/2009    2:13 PM  Hepatic Function  Total Protein 6.5 - 8.1 g/dL 6.5   6.8   7.4    Albumin 3.5 - 5.0 g/dL 3.9   4.1   4.5    AST 15 - 41 U/L $Remo'27   20   17    'MAAhS$ ALT 0 - 44 U/L $Remo'17   18   9    'bUMsI$ Alk Phosphatase 38 - 126 U/L 91   101   87    Total Bilirubin 0.3 - 1.2 mg/dL 1.3   1.0   0.9        Latest Ref Rng & Units 08/10/2021    3:04 PM 08/04/2019    6:28 PM 06/26/2018    2:56 PM  CBC  WBC 4.0 - 10.5 K/uL 7.6   9.2   7.4    Hemoglobin 13.0 - 17.0 g/dL 15.1   14.7   14.5    Hematocrit 39.0 - 52.0  % 44.8   43.4   42.3    Platelets 150.0 - 400.0 K/uL 143.0   119   138.0     Lab Results  Component Value Date   MCV 96.8 08/10/2021   MCV 95.8 08/04/2019   MCV 92.3 06/26/2018   Lab Results  Component Value Date  TSH 3.70 08/13/2015   Lipid Panel  No results found for: CHOL, TRIG, HDL, CHOLHDL, VLDL, LDLCALC, LDLDIRECT   RADIOLOGY: No results found.  IMPRESSION:  1. Dyspnea on exertion   2. Coronary artery disease involving native coronary artery of native heart without angina pectoris   3. 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 First degree AV block   5. Pulmonary emphysema, unspecified emphysema type ([place])   6. Hyperlipidemia with target LDL less than 70   7. Hypothyroidism, unspecified type   8. History of depression     ASSESSMENT AND PLAN: Mr. Newman is an 81 year-old white male who has established CAD with documented midsystolic bridging of the LAD.  Cardiac atheterization in January 2012  revealed vigorous left ventricle contractility with LVH and  significant mid LAD systolic bridging with the vessel appearing normal during diastole and narrowing to 95% focally in the intramyocardial segment during systole.  There was slight progression of previously documented circumflex disease with the proximal AV groove circumflex narrowing to 50-60% and 30-40% narrowing in the AV groove circumflex after the second marginal branch.  Fortunately, he quit tobacco use in May 2012.  Due to increasing symptomatology and an equivocal nuclear study repeat catheterization on 11/24/2014 was unchanged and showed again dynamic systolic bridging in the mid LAD intramyocardial segment, which now at 80-90% during systole and was 0% during diastole.  The mid AV groove circumflex had 50% stenosis in a dominant left circumflex system.  The last several years he has continued to be stable from a cardiovascular standpoint.  Presently, he continues to be without anginal  symptomatology.  He is now exercising at least 5 days/week walking on a treadmill for 20 minutes.  His most recent chest CT high-resolution study from September 07, 2021 was essentially unchanged and showed stable bronchiectasis and subpleural bronchiolectasis.  He had unchanged elevation of his right hemidiaphragm with associated scarring or atelectasis in the right base, emphysema, CAD, and cholelithiasis.  He experiences some shortness of breath with stair climbing.  He will be following up with Dr. Vaughan Browner for his PFTs and emphysema.  Lipid studies continue to be excellent on rosuvastatin low-dose at 5 mg.  I have recommended a follow-up echo Doppler study for reassessment of LV systolic and diastolic function.  He continues to be on levothyroxine for hypothyroidism.  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  There is no wheezing on exam today on Trelegy.  He is on bupropion for depression.  Blood pressures controlled on amlodipine 10 mg, isosorbide 90 mg.  I will contact him regarding the results of his echo and will see him in 6 months for reevaluation.    Troy Sine, MD, [facility]  10/21/2021 8:45 AM

## 2021-10-12 NOTE — Patient Instructions (Signed)
Medication Instructions:  ?STOP Amlodipine  ? ?*If you need a refill on your cardiac medications before your next appointment, please call your pharmacy* ? ?Testing/Procedures: ?Echocardiogram - Your physician has requested that you have an echocardiogram. Echocardiography is a painless test that uses sound waves to create images of your heart. It provides your doctor with information about the size and shape of your heart and how well your heart?s chambers and valves are working. This procedure takes approximately one hour. There are no restrictions for this procedure.  ? ? ?Follow-Up: ?At [facility], you and your health needs are our priority.  As part of our continuing mission to provide you with exceptional heart care, we have created designated Provider Care Teams.  These Care Teams include your primary Cardiologist (physician) and Advanced Practice Providers (APPs -  Physician Assistants and Nurse Practitioners) who all work together to provide you with the care you need, when you need it. ? ?We recommend signing up for the patient portal called "MyChart".  Sign up information is provided on this After Visit Summary.  MyChart is used to connect with patients for Virtual Visits (Telemedicine).  Patients are able to view lab/test results, encounter notes, upcoming appointments, etc.  Non-urgent messages can be sent to your provider as well.   ?To learn more about what you can do with MyChart, go to [email or web address].   ? ?Your next appointment:   ?6 month(s) ? ?The format for your next appointment:   ?In Person ? ?Provider:   ?Nicki Guadalajara, MD   ? ? ? ? ? ? ? ?

## 2021-10-21 ENCOUNTER — Encounter: Payer: Self-pay | Admitting: Cardiovascular Disease

## 2021-10-28 ENCOUNTER — Ambulatory Visit (HOSPITAL_COMMUNITY): Payer: Medicare Other | Attending: Cardiology

## 2021-10-28 DIAGNOSIS — R0609 Other forms of dyspnea: Secondary | ICD-10-CM | POA: Diagnosis not present

## 2021-10-28 LAB — ECHOCARDIOGRAM COMPLETE
Area-P 1/2: 2.68 cm2
Calc EF: 54.4 %
S' Lateral: 2.2 cm
Single Plane A2C EF: 58.1 %
Single Plane A4C EF: 53.7 %

## 2021-11-01 DIAGNOSIS — I10 Essential (primary) hypertension: Secondary | ICD-10-CM | POA: Diagnosis not present

## 2021-11-02 DIAGNOSIS — I10 Essential (primary) hypertension: Secondary | ICD-10-CM | POA: Diagnosis not present

## 2021-11-03 DIAGNOSIS — I10 Essential (primary) hypertension: Secondary | ICD-10-CM | POA: Diagnosis not present

## 2021-11-09 DIAGNOSIS — I10 Essential (primary) hypertension: Secondary | ICD-10-CM | POA: Diagnosis not present

## 2021-11-10 DIAGNOSIS — I10 Essential (primary) hypertension: Secondary | ICD-10-CM | POA: Diagnosis not present

## 2021-11-15 ENCOUNTER — Ambulatory Visit: Payer: Medicare Other | Admitting: Pulmonary Disease

## 2021-11-17 DIAGNOSIS — H353221 Exudative age-related macular degeneration, left eye, with active choroidal neovascularization: Secondary | ICD-10-CM | POA: Diagnosis not present

## 2021-11-17 DIAGNOSIS — H353114 Nonexudative age-related macular degeneration, right eye, advanced atrophic with subfoveal involvement: Secondary | ICD-10-CM | POA: Diagnosis not present

## 2021-11-17 DIAGNOSIS — H35371 Puckering of macula, right eye: Secondary | ICD-10-CM | POA: Diagnosis not present

## 2021-11-17 DIAGNOSIS — H43813 Vitreous degeneration, bilateral: Secondary | ICD-10-CM | POA: Diagnosis not present

## 2021-11-21 DIAGNOSIS — Z23 Encounter for immunization: Secondary | ICD-10-CM | POA: Diagnosis not present

## 2021-11-21 DIAGNOSIS — I10 Essential (primary) hypertension: Secondary | ICD-10-CM | POA: Diagnosis not present

## 2021-11-22 ENCOUNTER — Encounter: Payer: Self-pay | Admitting: Pulmonary Disease

## 2021-11-22 ENCOUNTER — Ambulatory Visit (INDEPENDENT_AMBULATORY_CARE_PROVIDER_SITE_OTHER): Payer: Medicare Other | Admitting: Pulmonary Disease

## 2021-11-22 ENCOUNTER — Ambulatory Visit: Payer: Medicare Other | Admitting: Pulmonary Disease

## 2021-11-22 ENCOUNTER — Telehealth: Payer: Self-pay | Admitting: Pulmonary Disease

## 2021-11-22 VITALS — BP 120/62 | HR 78 | Temp 97.5°F | Ht 71.0 in | Wt 175.0 lb

## 2021-11-22 DIAGNOSIS — Z87891 Personal history of nicotine dependence: Secondary | ICD-10-CM

## 2021-11-22 DIAGNOSIS — J449 Chronic obstructive pulmonary disease, unspecified: Secondary | ICD-10-CM

## 2021-11-22 MED ORDER — BREZTRI AEROSPHERE 160-9-4.8 MCG/ACT IN AERO: 2.0000 | INHALATION_SPRAY | Freq: Two times a day (BID) | RESPIRATORY_TRACT | 0 refills | Status: DC

## 2021-11-22 MED ORDER — BREZTRI AEROSPHERE 160-9-4.8 MCG/ACT IN AERO: 2.0000 | INHALATION_SPRAY | Freq: Two times a day (BID) | RESPIRATORY_TRACT | 5 refills | Status: AC

## 2021-11-22 NOTE — Progress Notes (Signed)
PFT done today. 

## 2021-11-22 NOTE — Patient Instructions (Signed)
We will change the Trelegy to breztri Referred to pulmonary rehab Referred to [facility] for consideration of endobronchial valve Follow-up in 6 months

## 2021-11-22 NOTE — Progress Notes (Signed)
Timothy Spencer    [id number]    1940/08/17  Primary Care Physician:Pharr, Zollie Beckers, MD  Referring Physician: Merri Brunette, MD [address] [place],  [place] [address]  Chief complaint: Follow-up for dyspnea, emphysema  HPI: 81 year old with moderate COPD, hypertension, hypothyroidism, restless leg syndrome, coronary artery disease.  Seen at [facility] pulmonary for evaluation of dyspnea in 2017.  He had a cardiopulmonary exercise test done there which showed deconditioning.  Also has history of right bundle branch block, coronary artery disease and follows with cardiology on a regular basis. He has enrolled in an exercise program and works out 4 times a week.  Reports marked improvement in symptoms of dyspnea.  Over 100 pack year of smoking in Quit 2015  Interim history: Continues to be active exercising on treadmill and stationary cycle Continues on Trelegy inhaler  He had repeat CT scan, labs and PFTs for worsening dyspnea and is here for review Also follow-up with his cardiologist with echocardiogram showing no significant abnormality  Outpatient Encounter Medications as of 11/22/2021  Medication Sig   albuterol (VENTOLIN HFA) 108 (90 Base) MCG/ACT inhaler Inhale 2 puffs into the lungs every 6 (six) hours as needed for wheezing or shortness of breath.   amLODipine (NORVASC) 10 MG tablet Take 10 mg by mouth daily.   aspirin EC 81 MG tablet Take 81 mg by mouth daily.   buPROPion (WELLBUTRIN XL) 150 MG 24 hr tablet Take 150 mg by mouth every morning.   isosorbide mononitrate (IMDUR) 60 MG 24 hr tablet TAKE 1 AND 1/2 TABLETS(90 MG) BY MOUTH DAILY   latanoprost (XALATAN) 0.005 % ophthalmic solution 1 drop at bedtime.   levothyroxine (SYNTHROID) 112 MCG tablet Take 112 mcg by mouth daily.   rOPINIRole (REQUIP) 1 MG tablet Take 1 mg by mouth 2 (two) times daily.    rosuvastatin (CRESTOR) 5 MG tablet Take 5 mg by mouth every other day.   sertraline (ZOLOFT)  100 MG tablet Take 100 mg by mouth daily.   tamsulosin (FLOMAX) 0.4 MG CAPS capsule Take 0.4 mg by mouth daily.    TRELEGY ELLIPTA 100-62.5-25 MCG/ACT AEPB INHALE 1 PUFF INTO THE LUNGS DAILY   No facility-administered encounter medications on file as of 11/22/2021.   Physical Exam: Blood pressure 132/74, pulse 100, temperature 97.9 F (36.6 C), temperature source Oral, height 5\' 11"  (1.803 m), weight 178 lb 12.8 oz (81.1 kg), SpO2 96 %. Gen:      No acute distress HEENT:  EOMI, sclera anicteric Neck:     No masses; no thyromegaly Lungs:    Clear to auscultation bilaterally; normal respiratory effort CV:         Regular rate and rhythm; no murmurs Abd:      + bowel sounds; soft, non-tender; no palpable masses, no distension Ext:    No edema; adequate peripheral perfusion Skin:      Warm and dry; no rash Neuro: alert and oriented x 3 Psych: normal mood and affect   Data Reviewed: PFTs  06/26/2018 FVC 2.93 [55%], FEV1 2.03 [60%], F/F 69, TLC 5.54 (10 4%), DLCO 16.26 (46%)  01/14/2021 FVC 2.77 (63%], FEV1 2.00 [63%], F/F 72, TLC 5.26 [90%], DLCO 14.78 [10%] Mild restriction, severe diffusion defect  11/22/2021 FVC 2.26 [52%], FEV1 1.67 [54%], F/F 74, TLC 5.39 [72%], DLCO 15.50 [60%] Mild restriction, moderate diffusion  11/24/2021 04/11/17:  Walked 312 Meters / Baseline Sat 100% on RA / Nadir Sat 98%  on RA   Imaging CT chest 10/11/16- 4 mm left lower lobe nodule.  Previously noted lower lobe opacities have resolved.  Centrilobular emphysema.  Pleural thickening versus small effusion on the left CT chest 07/13/16-6 mm left lower lobe nodule.  9 mm left lower lobe nodule.  4 mm left lower lobe nodule.   Questionable pleural thickening versus pleural effusion CT 10/12/2017- Emphysema, stable pulmonary nodules.  Pleural-parenchymal scarring at the right hemithorax. Chest x-ray 06/26/18-chronic right hemidiaphragm elevation with mild atelectasis. High-res CT 09/07/2021-irregular peripheral  opacities and septal thickening, right hemidiaphragm elevation, emphysema. I have reviewed the images personally.  ESOPHAGRAM/BARIUM SWALLOW 09/10/15:  Small hiatal hernia with mild stricture at the gastroesophageal junction. Negative for reflux.      LABS Alpha-1 antitrypsin 03/06/2017-159, MS phenotype Alpha-1 antitrypsin 06/06/2018-156 Alpha-1 antitrypsin 08/10/2021-148  CBC 06/26/2018-WBC 7.4, eos 2%, absolute eosinophil count 148 CBC 08/10/2021-WBC 7.6, hemoglobin 15.1, eos 2.2% IgE 149  01/29/09 ANA:  Negative RA:  79  Cardiac: Cardio pulmonary Exercise stress test 2017 from [facility] This cardiopulmonary exercise test demonstrated a decreased exercise capacity. The patient did not demonstrate a pulmonary limitation. Given the patient's anaerobic threshold > 40% with increased heart rate reserve, poor effort or deconditioning can account for the patient's symptoms. However, the patient did display a reduced oxygen pulse which is an indirect measure of stroke volume which may indicate early cardiovascular disease. Please note that the patient's baseline EKG displayed a RBBB limiting an evaluation of ST segment alterations during the procedure.  Echocardiogram 10/28/2021 LVEF 60 to 65%, grade 1 diastolic dysfunction.  Normal RV size and function  Assessment:  Emphysema No significant obstruction on PFTs Maintaining an exercise regimen at home but complains of increasing dyspnea with no desats on exertion and overnight Follow-up PFTs reviewed with actually improvement in lung volumes and diffusion capacity and his CT scan does not show significant changes compared to prior and no evidence of interstitial lung disease.  Echocardiogram as noted above is unremarkable  We will change Trelegy to breztri Attempt pulmonary rehab if specific and improved dyspnea symptoms He wants to get evaluated for endobronchial valve.  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 alpha-1 antitrypsin levels have remained stable  Left lower lobe pulmonary nodule. Has remained stable on follow-up scans.  He is past the age cutoff to continue low-dose screening CT Review chest x-ray  Health maintenance 03/02/2019-influenza Patient states that he is up-to-date with his pneumonia vaccine at his primary care. He is also vaccinated against Covid  Plan/Recommendations: - Change Trelegy to breztri - Pulmonary rehab, referral to [facility] for endobronchial valve  Chilton Greathouse MD [facility] 11/22/2021, 1:35 PM  CC: Merri Brunette, MD

## 2021-11-22 NOTE — Telephone Encounter (Signed)
Called and spoke with [facility]. Clarified that the breztri was replacing the trelegy.   Nothing further needed.

## 2021-11-28 ENCOUNTER — Encounter (HOSPITAL_COMMUNITY): Payer: Self-pay

## 2021-11-30 ENCOUNTER — Telehealth (HOSPITAL_COMMUNITY): Payer: Self-pay

## 2021-11-30 NOTE — Telephone Encounter (Signed)
Pt returned phone and stated that he is not interested in the pulmonary rehab program. Closed referral.

## 2021-12-07 LAB — PULMONARY FUNCTION TEST
DL/VA % pred: 75 %
DL/VA: 2.89 ml/min/mmHg/L
DLCO cor % pred: 60 %
DLCO cor: 15.5 ml/min/mmHg
DLCO unc % pred: 60 %
DLCO unc: 15.5 ml/min/mmHg
FEF 25-75 Post: 0.93 L/sec
FEF 25-75 Pre: 1.03 L/sec
FEF2575-%Change-Post: -8 %
FEF2575-%Pred-Post: 44 %
FEF2575-%Pred-Pre: 48 %
FEV1-%Change-Post: -1 %
FEV1-%Pred-Post: 54 %
FEV1-%Pred-Pre: 54 %
FEV1-Post: 1.67 L
FEV1-Pre: 1.69 L
FEV1FVC-%Change-Post: 3 %
FEV1FVC-%Pred-Pre: 99 %
FEV6-%Change-Post: -4 %
FEV6-%Pred-Post: 55 %
FEV6-%Pred-Pre: 58 %
FEV6-Post: 2.26 L
FEV6-Pre: 2.35 L
FEV6FVC-%Change-Post: 0 %
FEV6FVC-%Pred-Post: 106 %
FEV6FVC-%Pred-Pre: 106 %
FVC-%Change-Post: -4 %
FVC-%Pred-Post: 52 %
FVC-%Pred-Pre: 54 %
FVC-Post: 2.26 L
FVC-Pre: 2.37 L
Post FEV1/FVC ratio: 74 %
Post FEV6/FVC ratio: 100 %
Pre FEV1/FVC ratio: 71 %
Pre FEV6/FVC Ratio: 100 %
RV % pred: 101 %
RV: 2.83 L
TLC % pred: 72 %
TLC: 5.39 L

## 2021-12-19 DIAGNOSIS — I10 Essential (primary) hypertension: Secondary | ICD-10-CM | POA: Diagnosis not present

## 2021-12-28 DIAGNOSIS — I10 Essential (primary) hypertension: Secondary | ICD-10-CM | POA: Diagnosis not present

## 2022-01-17 ENCOUNTER — Ambulatory Visit: Payer: Medicare Other | Admitting: Pulmonary Disease

## 2022-01-17 ENCOUNTER — Encounter: Payer: Self-pay | Admitting: Pulmonary Disease

## 2022-01-17 VITALS — BP 134/66 | HR 88 | Temp 98.1°F | Ht 71.0 in | Wt 174.6 lb

## 2022-01-17 DIAGNOSIS — J439 Emphysema, unspecified: Secondary | ICD-10-CM

## 2022-01-17 MED ORDER — ALBUTEROL SULFATE HFA 108 (90 BASE) MCG/ACT IN AERS: 2.0000 | INHALATION_SPRAY | Freq: Four times a day (QID) | RESPIRATORY_TRACT | 6 refills | Status: DC | PRN

## 2022-01-17 MED ORDER — IPRATROPIUM-ALBUTEROL 0.5-2.5 (3) MG/3ML IN SOLN: 3.0000 mL | RESPIRATORY_TRACT | 11 refills | Status: DC | PRN

## 2022-01-17 NOTE — Progress Notes (Signed)
UNIQUE SEARFOSS    [id number]    04-02-1941  Primary Care Physician:Pharr, Zollie Beckers, MD  Referring Physician: Merri Brunette, MD [address] [place],  [place] [address]  Chief complaint: Follow-up for dyspnea, emphysema  HPI: 81 year old with moderate COPD, hypertension, hypothyroidism, restless leg syndrome, coronary artery disease.  Seen at [facility] pulmonary for evaluation of dyspnea in 2017.  He had a cardiopulmonary exercise test done there which showed deconditioning.  Also has history of right bundle branch block, coronary artery disease and follows with cardiology on a regular basis. He has enrolled in an exercise program and works out 4 times a week.  Reports marked improvement in symptoms of dyspnea. Completed rehab program in 2020  Over 100 pack year of smoking in Quit 2015  Interim history: Continues to be active exercising on treadmill and stationary cycle But notes increasing dyspnea over the past year  He had repeat CT scan, labs and PFTs in 2023 for worsening dyspnea did not show significant interstitial lung disease and PFTs actually showed an improvement in lung volumes Also followed up with his cardiologist with echocardiogram showing no significant abnormality  Outpatient Encounter Medications as of 01/17/2022  Medication Sig   albuterol (VENTOLIN HFA) 108 (90 Base) MCG/ACT inhaler Inhale 2 puffs into the lungs every 6 (six) hours as needed for wheezing or shortness of breath.   amLODipine (NORVASC) 10 MG tablet Take 10 mg by mouth daily.   aspirin EC 81 MG tablet Take 81 mg by mouth daily.   Budeson-Glycopyrrol-Formoterol (BREZTRI AEROSPHERE) 160-9-4.8 MCG/ACT AERO Inhale 2 puffs into the lungs in the morning and at bedtime.   buPROPion (WELLBUTRIN XL) 150 MG 24 hr tablet Take 150 mg by mouth every morning.   isosorbide mononitrate (IMDUR) 60 MG 24 hr tablet TAKE 1 AND 1/2 TABLETS(90 MG) BY MOUTH DAILY   latanoprost (XALATAN) 0.005 %  ophthalmic solution 1 drop at bedtime.   levothyroxine (SYNTHROID) 112 MCG tablet Take 112 mcg by mouth daily.   rOPINIRole (REQUIP) 1 MG tablet Take 1 mg by mouth 2 (two) times daily.    rosuvastatin (CRESTOR) 5 MG tablet Take 5 mg by mouth every other day.   sertraline (ZOLOFT) 100 MG tablet Take 100 mg by mouth daily.   tamsulosin (FLOMAX) 0.4 MG CAPS capsule Take 0.4 mg by mouth daily.    [DISCONTINUED] Budeson-Glycopyrrol-Formoterol (BREZTRI AEROSPHERE) 160-9-4.8 MCG/ACT AERO Inhale 2 puffs into the lungs in the morning and at bedtime.   No facility-administered encounter medications on file as of 01/17/2022.   Physical Exam: Blood pressure 134/66, pulse 88, temperature 98.1 F (36.7 C), temperature source Oral, height 5\' 11"  (1.803 m), weight 174 lb 9.6 oz (79.2 kg), SpO2 99 %. Gen:      No acute distress HEENT:  EOMI, sclera anicteric Neck:     No masses; no thyromegaly Lungs:    Clear to auscultation bilaterally; normal respiratory effort CV:         Regular rate and rhythm; no murmurs Abd:      + bowel sounds; soft, non-tender; no palpable masses, no distension Ext:    No edema; adequate peripheral perfusion Skin:      Warm and dry; no rash Neuro: alert and oriented x 3 Psych: normal mood and affect   Data Reviewed: PFTs  06/26/2018 FVC 2.93 [55%], FEV1 2.03 [60%], F/F 69, TLC 5.54 (10 4%), DLCO 16.26 (46%)  01/14/2021 FVC 2.77 (63%], FEV1 2.00 [63%],  F/F 72, TLC 5.26 [90%], DLCO 14.78 [10%] Mild restriction, severe diffusion defect  11/22/2021 FVC 2.26 [52%], FEV1 1.67 [54%], F/F 74, TLC 5.39 [72%], DLCO 15.50 [60%] Mild restriction, moderate diffusion  04/11/17:  Walked 312 Meters / Baseline Sat 100% on RA / Nadir Sat 98% on RA   Imaging CT chest 10/11/16- 4 mm left lower lobe nodule.  Previously noted lower lobe opacities have resolved.  Centrilobular emphysema.  Pleural thickening versus small effusion on the left CT chest 07/13/16-6 mm left lower lobe nodule.  9  mm left lower lobe nodule.  4 mm left lower lobe nodule.   Questionable pleural thickening versus pleural effusion CT 10/12/2017- Emphysema, stable pulmonary nodules.  Pleural-parenchymal scarring at the right hemithorax. Chest x-ray 06/26/18-chronic right hemidiaphragm elevation with mild atelectasis. High-res CT 09/07/2021-irregular peripheral opacities and septal thickening, right hemidiaphragm elevation, emphysema. I have reviewed the images personally.  ESOPHAGRAM/BARIUM SWALLOW 09/10/15:  Small hiatal hernia with mild stricture at the gastroesophageal junction. Negative for reflux.    LABS Alpha-1 antitrypsin 03/06/2017-159, MS phenotype Alpha-1 antitrypsin 06/06/2018-156 Alpha-1 antitrypsin 08/10/2021-148  CBC 06/26/2018-WBC 7.4, eos 2%, absolute eosinophil count 148 CBC 08/10/2021-WBC 7.6, hemoglobin 15.1, eos 2.2% IgE 149  01/29/09 ANA:  Negative RA:  79  Cardiac: Cardio pulmonary Exercise stress test 2017 from [facility] This cardiopulmonary exercise test demonstrated a decreased exercise capacity. The patient did not demonstrate a pulmonary limitation. Given the patient's anaerobic threshold > 40% with increased heart rate reserve, poor effort or deconditioning can account for the patient's symptoms. However, the patient did display a reduced oxygen pulse which is an indirect measure of stroke volume which may indicate early cardiovascular disease. Please note that the patient's baseline EKG displayed a RBBB limiting an evaluation of ST segment alterations during the procedure.  Echocardiogram 10/28/2021 LVEF 60 to 65%, grade 1 diastolic dysfunction.  Normal RV size and function  Assessment:  Emphysema No significant obstruction on PFTs Maintaining an exercise regimen at home but complains of increasing dyspnea with no desats on exertion and overnight Follow-up PFTs reviewed with actually improvement in lung volumes and diffusion capacity and his CT scan does not show significant  changes compared to prior and no evidence of interstitial lung disease.  Echocardiogram as noted above is unremarkable  He lacks breztri better than trelegy and will continue the same.  Continue albuterol.  Start DuoNebs Discussed pulmonary rehab but he has declined as he is trying to be active at home He wants to get evaluated for endobronchial valve.  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 alpha-1 antitrypsin levels have remained stable  Left lower lobe pulmonary nodule. Has remained stable on follow-up scans.  He is past the age cutoff to continue low-dose screening CT Reviewed chest x-ray  Health maintenance 03/02/2019-influenza Patient states that he is up-to-date with his pneumonia vaccine at his primary care. He is also vaccinated against Covid  Plan/Recommendations: - Continue breztri, albuterol, DuoNebs - Referral to [facility] for endobronchial valve.  Visit is scheduled for October of this year  Chilton Greathouse MD [facility] 01/17/2022, 2:40 PM  CC: Merri Brunette, MD

## 2022-01-17 NOTE — Patient Instructions (Signed)
Continue breztri inhaler We will order nebulizer and DuoNebs Continue albuterol as needed.  We will call in a prescription if needed Follow-up in 6 months.

## 2022-01-17 NOTE — Addendum Note (Signed)
Addended by: Jacquiline Doe on: 01/17/2022 04:47 PM   Modules accepted: Orders

## 2022-01-18 MED ORDER — IPRATROPIUM-ALBUTEROL 0.5-2.5 (3) MG/3ML IN SOLN: 3.0000 mL | RESPIRATORY_TRACT | 11 refills | Status: DC | PRN

## 2022-01-18 NOTE — Addendum Note (Signed)
Addended by: Jacquiline Doe on: 01/18/2022 04:56 PM   Modules accepted: Orders

## 2022-01-24 DIAGNOSIS — J432 Centrilobular emphysema: Secondary | ICD-10-CM | POA: Diagnosis not present

## 2022-01-24 DIAGNOSIS — I10 Essential (primary) hypertension: Secondary | ICD-10-CM | POA: Diagnosis not present

## 2022-01-26 DIAGNOSIS — I10 Essential (primary) hypertension: Secondary | ICD-10-CM | POA: Diagnosis not present

## 2022-01-26 DIAGNOSIS — J432 Centrilobular emphysema: Secondary | ICD-10-CM | POA: Diagnosis not present

## 2022-01-26 DIAGNOSIS — J449 Chronic obstructive pulmonary disease, unspecified: Secondary | ICD-10-CM | POA: Diagnosis not present

## 2022-01-29 ENCOUNTER — Encounter (HOSPITAL_COMMUNITY): Payer: Self-pay | Admitting: Emergency Medicine

## 2022-01-29 ENCOUNTER — Emergency Department (HOSPITAL_COMMUNITY): Payer: Medicare Other

## 2022-01-29 ENCOUNTER — Observation Stay (HOSPITAL_COMMUNITY)
Admission: EM | Admit: 2022-01-29 | Discharge: 2022-01-30 | Disposition: A | Discharge status: Home / Self Care | Payer: Medicare Other | Attending: Internal Medicine | Admitting: Internal Medicine

## 2022-01-29 ENCOUNTER — Other Ambulatory Visit: Payer: Self-pay

## 2022-01-29 DIAGNOSIS — I959 Hypotension, unspecified: Secondary | ICD-10-CM | POA: Diagnosis not present

## 2022-01-29 DIAGNOSIS — Z7982 Long term (current) use of aspirin: Secondary | ICD-10-CM | POA: Diagnosis not present

## 2022-01-29 DIAGNOSIS — N179 Acute kidney failure, unspecified: Secondary | ICD-10-CM | POA: Diagnosis not present

## 2022-01-29 DIAGNOSIS — R404 Transient alteration of awareness: Principal | ICD-10-CM | POA: Insufficient documentation

## 2022-01-29 DIAGNOSIS — R4182 Altered mental status, unspecified: Secondary | ICD-10-CM | POA: Diagnosis present

## 2022-01-29 DIAGNOSIS — Z79899 Other long term (current) drug therapy: Secondary | ICD-10-CM | POA: Diagnosis not present

## 2022-01-29 DIAGNOSIS — I2510 Atherosclerotic heart disease of native coronary artery without angina pectoris: Secondary | ICD-10-CM | POA: Diagnosis not present

## 2022-01-29 DIAGNOSIS — F419 Anxiety disorder, unspecified: Secondary | ICD-10-CM | POA: Insufficient documentation

## 2022-01-29 DIAGNOSIS — J449 Chronic obstructive pulmonary disease, unspecified: Secondary | ICD-10-CM | POA: Diagnosis not present

## 2022-01-29 DIAGNOSIS — F32A Depression, unspecified: Secondary | ICD-10-CM | POA: Insufficient documentation

## 2022-01-29 LAB — CBG MONITORING, ED: Glucose-Capillary: 104 mg/dL — ABNORMAL HIGH (ref 70–99)

## 2022-01-29 LAB — CBC
HCT: 39.7 % (ref 39.0–52.0)
Hemoglobin: 13.6 g/dL (ref 13.0–17.0)
MCH: 32.7 pg (ref 26.0–34.0)
MCHC: 34.3 g/dL (ref 30.0–36.0)
MCV: 95.4 fL (ref 80.0–100.0)
Platelets: 132 10*3/uL — ABNORMAL LOW (ref 150–400)
RBC: 4.16 MIL/uL — ABNORMAL LOW (ref 4.22–5.81)
RDW: 14 % (ref 11.5–15.5)
WBC: 5.3 10*3/uL (ref 4.0–10.5)
nRBC: 0 % (ref 0.0–0.2)

## 2022-01-29 LAB — COMPREHENSIVE METABOLIC PANEL
ALT: 11 U/L (ref 0–44)
AST: 17 U/L (ref 15–41)
Albumin: 3.6 g/dL (ref 3.5–5.0)
Alkaline Phosphatase: 81 U/L (ref 38–126)
Anion gap: 12 (ref 5–15)
BUN: 22 mg/dL (ref 8–23)
CO2: 24 mmol/L (ref 22–32)
Calcium: 9.3 mg/dL (ref 8.9–10.3)
Chloride: 109 mmol/L (ref 98–111)
Creatinine, Ser: 1.81 mg/dL — ABNORMAL HIGH (ref 0.61–1.24)
GFR, Estimated: 37 mL/min — ABNORMAL LOW (ref >60)
Glucose, Bld: 99 mg/dL (ref 70–99)
Potassium: 3.7 mmol/L (ref 3.5–5.1)
Sodium: 145 mmol/L (ref 135–145)
Total Bilirubin: 1 mg/dL (ref 0.3–1.2)
Total Protein: 6.1 g/dL — ABNORMAL LOW (ref 6.5–8.1)

## 2022-01-29 MED ORDER — ASPIRIN 300 MG RE SUPP: 300.0000 mg | Freq: Every day | RECTAL | Status: DC

## 2022-01-29 MED ORDER — ASPIRIN 81 MG PO CHEW
81.0000 mg | CHEWABLE_TABLET | Freq: Every day | ORAL | Status: DC
  Administered 2022-01-29: 81 mg via ORAL
  Filled 2022-01-29: qty 1

## 2022-01-29 MED ORDER — ACETAMINOPHEN 160 MG/5ML PO SOLN: 650.0000 mg | ORAL | Status: DC | PRN

## 2022-01-29 MED ORDER — SODIUM CHLORIDE 0.9 % IV SOLN: INTRAVENOUS | Status: DC

## 2022-01-29 MED ORDER — SENNOSIDES-DOCUSATE SODIUM 8.6-50 MG PO TABS: 1.0000 | ORAL_TABLET | Freq: Every evening | ORAL | Status: DC | PRN

## 2022-01-29 MED ORDER — ACETAMINOPHEN 650 MG RE SUPP: 650.0000 mg | RECTAL | Status: DC | PRN

## 2022-01-29 MED ORDER — ACETAMINOPHEN 325 MG PO TABS: 650.0000 mg | ORAL_TABLET | ORAL | Status: DC | PRN

## 2022-01-29 MED ORDER — STROKE: EARLY STAGES OF RECOVERY BOOK: Freq: Once | Status: DC

## 2022-01-29 MED ORDER — ENOXAPARIN SODIUM 40 MG/0.4ML IJ SOSY
40.0000 mg | PREFILLED_SYRINGE | INTRAMUSCULAR | Status: DC
Start: 2022-01-30 — End: 2022-01-29

## 2022-01-29 MED ORDER — LACTATED RINGERS IV BOLUS
1000.0000 mL | Freq: Once | INTRAVENOUS | Status: AC
  Administered 2022-01-29: 1000 mL via INTRAVENOUS

## 2022-01-29 NOTE — Consult Note (Incomplete)
History and Physical    Timothy Spencer MRN:[id number] DOB: 09/19/1940 DOA: 01/29/2022  PCP: Pharr, Walter, MD  Patient coming from: Home via EMS  I have personally briefly reviewed patient's old medical records in [facility]  Chief Complaint: Change in mentation  HPI: Timothy Spencer is a 80 y.o. male with medical history significant for emphysema, CAD, CKD stage IIIa, HTN, HLD, hypothyroidism, RLS who presented to the ED for evaluation of altered mental status.  History is supplemented by patient's spouse at bedside.  Spouse states that while they were eating dinner around 5 PM patient developed new onset of confusion.  He was acting confused about what type of food he was eating and how to use his fork.  He then was apparently seeing people and motion that were not actually present.  He did not have any facial droop, slurred speech, focal weakness, numbness/tingling, chest pain, palpitations, nausea, vomiting, diaphoresis.  He did not have any difficulty with ambulation.  EMS were called and on their arrival he was still confused but returned to his baseline after approximately 1 hour.    ED Course  Labs/Imaging on admission: I have personally reviewed following labs and imaging studies.  Initial vitals showed BP 116/58, pulse 77, RR 18, temp 97.8 F, SPO2 100% on room air.  Labs show sodium 145, potassium 3.7, bicarb 24, BUN 22, creatinine 1.1, serum glucose 99, LFTs within normal limits, WBC 5.3, hemoglobin 13.6, platelets 132,000.  CT head without contrast negative for acute intracranial pathology.  Patient was given 1 L LR in order to receive aspirin.  EDP discussed with neurology who are consulting.  The hospitalist service was consulted to admit for further evaluation and management.  Review of Systems: All systems reviewed and are negative except as documented in history of present illness above.   Past Medical History:  Diagnosis Date   Anxiety and depression     BPH (benign prostatic hyperplasia)    CAD (coronary artery disease)    COPD (chronic obstructive pulmonary disease) (HCC)    DOE (dyspnea on exertion)    ED (erectile dysfunction)    Emphysema of lung (HCC)    First degree AV block    GERD (gastroesophageal reflux disease)    Glaucoma    Gout    Gynecomastia    H/O ETOH abuse    Hemochromatosis    Hyperlipidemia    Hypertension    Hypothyroidism    Macular degeneration    OSA (obstructive sleep apnea)    Osteopenia    Peripheral neuropathy    RLS (restless legs syndrome)    Thrombocytopenia (HCC)     Past Surgical History:  Procedure Laterality Date   CARDIAC CATHETERIZATION  03/27/2008   Medical therapy   CARDIAC CATHETERIZATION  06/03/2010   Medical theray and smoking cessation   CARDIAC CATHETERIZATION N/A 11/24/2014   Procedure: Left Heart Cath and Coronary Angiography;  Surgeon: Thomas A Kelly, MD;  Location: [facility] INVASIVE CV LAB;  Service: Cardiovascular;  Laterality: N/A;   CARDIOPULMONARY EXERCISE TEST  02/12/2012   Peak VO2 63% of predicted   ORIF FEMUR FRACTURE Right    TRANSTHORACIC ECHOCARDIOGRAM  11/28/2011   EF >55%, normal    Social History:  reports that he quit smoking about 10 years ago. His smoking use included cigarettes. He started smoking about 61 years ago. He has a 100.00 pack-year smoking history. He has never used smokeless tobacco. He reports that he does not drink alcohol and   does not use drugs.  No Known Allergies  Family History  Adopted: Yes     Prior to Admission medications   Medication Sig Start Date End Date Taking? Authorizing Provider  albuterol (VENTOLIN HFA) 108 (90 Base) MCG/ACT inhaler Inhale 2 puffs into the lungs every 6 (six) hours as needed for wheezing or shortness of breath. 01/17/22   Mannam, Praveen, MD  amLODipine (NORVASC) 10 MG tablet Take 10 mg by mouth daily. 10/10/21   [provider]  aspirin EC 81 MG tablet Take 81 mg by mouth daily.    [provider]  Budeson-Glycopyrrol-Formoterol (BREZTRI AEROSPHERE) 160-9-4.8 MCG/ACT AERO Inhale 2 puffs into the lungs in the morning and at bedtime. 11/22/21   Mannam, Praveen, MD  buPROPion (WELLBUTRIN XL) 150 MG 24 hr tablet Take 150 mg by mouth every morning. 07/17/21   [provider]  ipratropium-albuterol (DUONEB) 0.5-2.5 (3) MG/3ML SOLN Take 3 mLs by nebulization every 4 (four) hours as needed. 01/18/22   Mannam, Praveen, MD  isosorbide mononitrate (IMDUR) 60 MG 24 hr tablet TAKE 1 AND 1/2 TABLETS(90 MG) BY MOUTH DAILY 05/13/20   Kelly, Thomas A, MD  latanoprost (XALATAN) 0.005 % ophthalmic solution 1 drop at bedtime. 08/17/21   [provider]  levothyroxine (SYNTHROID) 112 MCG tablet Take 112 mcg by mouth daily. 07/29/19   [provider]  rOPINIRole (REQUIP) 1 MG tablet Take 1 mg by mouth 2 (two) times daily.  01/01/13   [provider]  rosuvastatin (CRESTOR) 5 MG tablet Take 5 mg by mouth every other day. 07/15/19   [provider]  sertraline (ZOLOFT) 100 MG tablet Take 100 mg by mouth daily. 01/25/13   [provider]  tamsulosin (FLOMAX) 0.4 MG CAPS capsule Take 0.4 mg by mouth daily.  01/31/13   [provider]    Physical Exam: Vitals:   01/29/22 1836 01/29/22 1838 01/29/22 1840 01/29/22 2111  BP:   (!) 116/58   Pulse:   77   Resp:   18   Temp:   97.8 F (36.6 C) 97.7 F (36.5 C)  TempSrc:   Oral   SpO2: 95%  100%   Weight:  78.9 kg    Height:  5' 11" (1.803 m)     Constitutional: Resting in bed with head elevated, NAD, calm, comfortable Eyes: PERRL, EOMI, lids and conjunctivae normal ENMT: Mucous membranes are moist. Posterior pharynx clear of any exudate or lesions.Normal dentition.  Neck: normal, supple, no masses. Respiratory: clear to auscultation bilaterally, no wheezing, no crackles. Normal respiratory effort. No accessory muscle use.  Cardiovascular: Regular rate and rhythm, no murmurs / rubs / gallops. No extremity  edema. 2+ pedal pulses. Abdomen: no tenderness, no masses palpated.  Musculoskeletal: no clubbing / cyanosis. No joint deformity upper and lower extremities. Good ROM, no contractures. Normal muscle tone.  Skin: no rashes, lesions, ulcers. No induration Neurologic: CN 2-12 grossly intact. Sensation intact. Strength 5/5 in all 4.  Psychiatric: Alert and oriented x 3. Normal mood.   EKG: Personally reviewed. Sinus rhythm, first-degree AV block, rate 76, RBBB, LAFB, no acute ischemic changes.  Similar to prior.  Assessment/Plan  Transient change in mentation: Initially there was concern for aphasia however after discussion with patient, spouse, and neurology this does not appear to have been the case.  He had brief episode of confusion without any other symptoms and is back to his baseline.  No further CVA work-up needed in the acute setting therefore admission canceled.    He is otherwise stable to be discharged home.  Discussed with ED provider who will arrange for discharge home.  AKI on CKD stage IIIa: Creatinine 1.81, previously 1.4 in March 2021, no other recent baseline labs available.  Recently started on losartan.  He was given 1 L LR fluids in the ED.  No sign of uremia.  It is reasonable for him to be discharged home with recommendation to hold losartan and follow-up with his PCP.  Family Communication: Spouse at bedside Disposition Plan: Home Consults called: Neurology   If 7PM-7AM, please contact night-coverage [email or web address]  01/30/2022, 12:21 AM  

## 2022-01-29 NOTE — ED Notes (Signed)
The pt passed his swallow screen ?

## 2022-01-29 NOTE — H&P (Signed)
History and Physical    Timothy Spencer WJX:[id number] DOB: 1941-01-09 DOA: 01/29/2022  PCP: Merri Brunette, MD  Patient coming from: Home via EMS  I have personally briefly reviewed patient's old medical records in [facility]  Chief Complaint: Change in mentation  HPI: Timothy Spencer is a 81 y.o. male with medical history significant for emphysema, CAD, CKD stage IIIa, HTN, HLD, hypothyroidism, RLS who presented to the ED for evaluation of altered mental status.  History is supplemented by patient's spouse at bedside.  Spouse states that while they were eating dinner around 5 PM patient developed new onset of confusion.  He was acting confused about what type of food he was eating and how to use his fork.  He then was apparently seeing people and motion that were not actually present.  He did not have any facial droop, slurred speech, focal weakness, numbness/tingling, chest pain, palpitations, nausea, vomiting, diaphoresis.  He did not have any difficulty with ambulation.  EMS were called and on their arrival he was still confused but returned to his baseline after approximately 1 hour.    ED Course  Labs/Imaging on admission: I have personally reviewed following labs and imaging studies.  Initial vitals showed BP 116/58, pulse 77, RR 18, temp 97.8 F, SPO2 100% on room air.  Labs show sodium 145, potassium 3.7, bicarb 24, BUN 22, creatinine 1.1, serum glucose 99, LFTs within normal limits, WBC 5.3, hemoglobin 13.6, platelets 132,000.  CT head without contrast negative for acute intracranial pathology.  Patient was given 1 L LR in order to receive aspirin.  EDP discussed with neurology who are consulting.  The hospitalist service was consulted to admit for further evaluation and management.  Review of Systems: All systems reviewed and are negative except as documented in history of present illness above.   Past Medical History:  Diagnosis Date   Anxiety and depression     BPH (benign prostatic hyperplasia)    CAD (coronary artery disease)    COPD (chronic obstructive pulmonary disease) (HCC)    DOE (dyspnea on exertion)    ED (erectile dysfunction)    Emphysema of lung (HCC)    First degree AV block    GERD (gastroesophageal reflux disease)    Glaucoma    Gout    Gynecomastia    H/O ETOH abuse    Hemochromatosis    Hyperlipidemia    Hypertension    Hypothyroidism    Macular degeneration    OSA (obstructive sleep apnea)    Osteopenia    Peripheral neuropathy    RLS (restless legs syndrome)    Thrombocytopenia (HCC)     Past Surgical History:  Procedure Laterality Date   CARDIAC CATHETERIZATION  03/27/2008   Medical therapy   CARDIAC CATHETERIZATION  06/03/2010   Medical theray and smoking cessation   CARDIAC CATHETERIZATION N/A 11/24/2014   Procedure: Left Heart Cath and Coronary Angiography;  Surgeon: Lennette Bihari, MD;  Location: [facility];  Service: Cardiovascular;  Laterality: N/A;   CARDIOPULMONARY EXERCISE TEST  02/12/2012   Peak VO2 63% of predicted   ORIF FEMUR FRACTURE Right    TRANSTHORACIC ECHOCARDIOGRAM  11/28/2011   EF >55%, normal    Social History:  reports that he quit smoking about 10 years ago. His smoking use included cigarettes. He started smoking about 61 years ago. He has a 100.00 pack-year smoking history. He has never used smokeless tobacco. He reports that he does not drink alcohol and  does not use drugs.  No Known Allergies  Family History  Adopted: Yes     Prior to Admission medications   Medication Sig Start Date End Date Taking? Authorizing Provider  albuterol (VENTOLIN HFA) 108 (90 Base) MCG/ACT inhaler Inhale 2 puffs into the lungs every 6 (six) hours as needed for wheezing or shortness of breath. 01/17/22   Mannam, Praveen, MD  amLODipine (NORVASC) 10 MG tablet Take 10 mg by mouth daily. 10/10/21   [provider]  aspirin EC 81 MG tablet Take 81 mg by mouth daily.    [provider]  Budeson-Glycopyrrol-Formoterol (BREZTRI AEROSPHERE) 160-9-4.8 MCG/ACT AERO Inhale 2 puffs into the lungs in the morning and at bedtime. 11/22/21   Mannam, Colbert Coyer, MD  buPROPion (WELLBUTRIN XL) 150 MG 24 hr tablet Take 150 mg by mouth every morning. 07/17/21   [provider]  ipratropium-albuterol (DUONEB) 0.5-2.5 (3) MG/3ML SOLN Take 3 mLs by nebulization every 4 (four) hours as needed. 01/18/22   Mannam, Colbert Coyer, MD  isosorbide mononitrate (IMDUR) 60 MG 24 hr tablet TAKE 1 AND 1/2 TABLETS(90 MG) BY MOUTH DAILY 05/13/20   Lennette Bihari, MD  latanoprost (XALATAN) 0.005 % ophthalmic solution 1 drop at bedtime. 08/17/21   [provider]  levothyroxine (SYNTHROID) 112 MCG tablet Take 112 mcg by mouth daily. 07/29/19   [provider]  rOPINIRole (REQUIP) 1 MG tablet Take 1 mg by mouth 2 (two) times daily.  01/01/13   [provider]  rosuvastatin (CRESTOR) 5 MG tablet Take 5 mg by mouth every other day. 07/15/19   [provider]  sertraline (ZOLOFT) 100 MG tablet Take 100 mg by mouth daily. 01/25/13   [provider]  tamsulosin (FLOMAX) 0.4 MG CAPS capsule Take 0.4 mg by mouth daily.  01/31/13   [provider]    Physical Exam: Vitals:   01/29/22 1836 01/29/22 1838 01/29/22 1840 01/29/22 2111  BP:   (!) 116/58   Pulse:   77   Resp:   18   Temp:   97.8 F (36.6 C) 97.7 F (36.5 C)  TempSrc:   Oral   SpO2: 95%  100%   Weight:  78.9 kg    Height:  5\' 11"  (1.803 m)     Constitutional: Resting in bed with head elevated, NAD, calm, comfortable Eyes: PERRL, EOMI, lids and conjunctivae normal ENMT: Mucous membranes are moist. Posterior pharynx clear of any exudate or lesions.Normal dentition.  Neck: normal, supple, no masses. Respiratory: clear to auscultation bilaterally, no wheezing, no crackles. Normal respiratory effort. No accessory muscle use.  Cardiovascular: Regular rate and rhythm, no murmurs / rubs / gallops. No extremity  edema. 2+ pedal pulses. Abdomen: no tenderness, no masses palpated.  Musculoskeletal: no clubbing / cyanosis. No joint deformity upper and lower extremities. Good ROM, no contractures. Normal muscle tone.  Skin: no rashes, lesions, ulcers. No induration Neurologic: CN 2-12 grossly intact. Sensation intact. Strength 5/5 in all 4.  Psychiatric: Alert and oriented x 3. Normal mood.   EKG: Personally reviewed. Sinus rhythm, first-degree AV block, rate 76, RBBB, LAFB, no acute ischemic changes.  Similar to prior.  Assessment/Plan  Transient change in mentation: Initially there was concern for aphasia however after discussion with patient, spouse, and neurology this does not appear to have been the case.  He had brief episode of confusion without any other symptoms and is back to his baseline.  No further CVA work-up needed in the acute setting therefore admission canceled.  He is otherwise stable to be discharged home.  Discussed with ED provider who will arrange for discharge home.  AKI on CKD stage IIIa: Creatinine 1.81, previously 1.4 in March 2021, no other recent baseline labs available.  Recently started on losartan.  He was given 1 L LR fluids in the ED.  No sign of uremia.  It is reasonable for him to be discharged home with recommendation to hold losartan and follow-up with his PCP.  Family Communication: Spouse at bedside Disposition Plan: Home Consults called: Neurology   If 7PM-7AM, please contact night-coverage [email or web address]  01/30/2022, 12:21 AM

## 2022-01-29 NOTE — Consult Note (Signed)
NEUROLOGY CONSULTATION NOTE   Date of service: January 30, 2022 Patient Name: Timothy Spencer MRN:  [id number] DOB:  Oct 16, 1940 Reason for consult: "aphasia x 1 hour" Requesting Provider: No att. providers found _ _ _   _ __   _ __ _ _  __ __   _ __   __ _  History of Present Illness  Timothy Spencer is a 81 y.o. male with PMH significant for CAD, HTN, HLD, Osa, macular degeneration who presents with sudden onset confusion.   Wife reports that she got takeout chinese food and they sat down to eat dinner. He looked sleepy and eyes closed, holding fork halfway. She called him and he seemed very slow to respond. Thought he was eating pizza and told wife that it did not taste right. He looked over her shoulder and saw a man's head. Wife called 911 but he was improving by the time EMS arrived. He was still somewhat confused thou. She followed EMS to the hospital. Confusion lasted roughly about 15-20 mins and was resolved by the time she saw him in the ED.  Reports recently started on losartan by her PCP. No fever, no chills, no signs of URI, UTI or gastroenteritis. No arm or leg weakness, no numbness, no vision deficit. Confused but was able to talk fine. No alcohol use , no recreational substances. Not been sleeping well.  ROS   Constitutional Denies weight loss, fever and chills.   HEENT Denies changes in vision and hearing.   Respiratory Denies SOB and cough.   CV Denies palpitations and CP   GI Denies abdominal pain, nausea, vomiting and diarrhea.   GU Denies dysuria and urinary frequency.   MSK Denies myalgia and joint pain.   Skin Denies rash and pruritus.   Neurological Denies headache and syncope.   Psychiatric Denies recent changes in mood. Denies anxiety and depression.    Past History   Past Medical History:  Diagnosis Date   Anxiety and depression    BPH (benign prostatic hyperplasia)    CAD (coronary artery disease)    COPD (chronic obstructive pulmonary disease) (HCC)     DOE (dyspnea on exertion)    ED (erectile dysfunction)    Emphysema of lung (HCC)    First degree AV block    GERD (gastroesophageal reflux disease)    Glaucoma    Gout    Gynecomastia    H/O ETOH abuse    Hemochromatosis    Hyperlipidemia    Hypertension    Hypothyroidism    Macular degeneration    OSA (obstructive sleep apnea)    Osteopenia    Peripheral neuropathy    RLS (restless legs syndrome)    Thrombocytopenia (HCC)    Past Surgical History:  Procedure Laterality Date   CARDIAC CATHETERIZATION  03/27/2008   Medical therapy   CARDIAC CATHETERIZATION  06/03/2010   Medical theray and smoking cessation   CARDIAC CATHETERIZATION N/A 11/24/2014   Procedure: Left Heart Cath and Coronary Angiography;  Surgeon: Lennette Bihari, MD;  Location: [facility];  Service: Cardiovascular;  Laterality: N/A;   CARDIOPULMONARY EXERCISE TEST  02/12/2012   Peak VO2 63% of predicted   ORIF FEMUR FRACTURE Right    TRANSTHORACIC ECHOCARDIOGRAM  11/28/2011   EF >55%, normal   Family History  Adopted: Yes   Social History   Socioeconomic History   Marital status: Married    Spouse name: Not on file   Number of children: Not  on file   Years of education: Not on file   Highest education level: Not on file  Occupational History   Not on file  Tobacco Use   Smoking status: Former    Packs/day: 2.00    Years: 50.00    Total pack years: 100.00    Types: Cigarettes    Start date: 04/11/1960    Quit date: 03/19/2011    Years since quitting: 10.8   Smokeless tobacco: Never   Tobacco comments:    Quit for 1 year total   Substance and Sexual Activity   Alcohol use: No    Alcohol/week: 0.0 standard drinks of alcohol   Drug use: No   Sexual activity: Not on file  Other Topics Concern   Not on file  Social History Narrative   [facility] (03/06/17):   Originally from [place], [place]. Has always lived in [place]. Has traveled to [place], [place], [place], [place], [place], & [place].  Has a dog currently. No bird exposure. No mold or hot tub exposure. Previously enjoyed riding horses and playing golf. Currently has no hobbies. 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    Social Determinants of Health   Financial Resource Strain: Not on file  Food Insecurity: Not on file  Transportation Needs: Not on file  Physical Activity: Not on file  Stress: Not on file  Social Connections: Not on file   No Known Allergies  Medications  (Not in a hospital admission)    Vitals   Vitals:   01/29/22 1836 01/29/22 1838 01/29/22 1840 01/29/22 2111  BP:   (!) 116/58   Pulse:   77   Resp:   18   Temp:   97.8 F (36.6 C) 97.7 F (36.5 C)  TempSrc:   Oral   SpO2: 95%  100%   Weight:  78.9 kg    Height:  5\' 11"  (1.803 m)       Body mass index is 24.27 kg/m.  Physical Exam   General: Laying comfortably in bed; in no acute distress.  HENT: Normal oropharynx and mucosa. Normal external appearance of ears and nose.  Neck: Supple, no pain or tenderness  CV: No JVD. No peripheral edema.  Pulmonary: Symmetric Chest rise. Normal respiratory effort.  Abdomen: Soft to touch, non-tender.  Ext: No cyanosis, edema, or deformity  Skin: No rash. Normal palpation of skin.   Musculoskeletal: Normal digits and nails by inspection. No clubbing.   Neurologic Examination  Mental status/Cognition: Alert, oriented to self, place, month and year, good attention.  Speech/language: Fluent, comprehension intact, object naming intact, repetition intact.  Cranial nerves:   CN II Pupils equal and reactive to light, no VF deficits    CN III,IV,VI EOM intact, no gaze preference or deviation, no nystagmus    CN V normal sensation in V1, V2, and V3 segments bilaterally    CN VII no asymmetry, no nasolabial fold flattening    CN VIII normal hearing to speech    CN IX & X normal palatal elevation, no uvular deviation   CN XI 5/5 head turn and 5/5  shoulder shrug bilaterally    CN XII midline tongue protrusion    Motor:  Muscle bulk: normal, tone normal, pronator drift none tremor none Mvmt Root Nerve  Muscle Right Left Comments  SA C5/6 Ax Deltoid 5 5   EF C5/6 Mc Biceps 5 5   EE C6/7/8 Rad Triceps 5  5   WF C6/7 Med FCR     WE C7/8 PIN ECU     F Ab C8/T1 U ADM/FDI 5 5   HF L1/2/3 Fem Illopsoas 5 5   KE L2/3/4 Fem Quad 5 5   DF L4/5 D Peron Tib Ant 5 5   PF S1/2 Tibial Grc/Sol 5 5    Reflexes:  Right Left Comments  Pectoralis      Biceps (C5/6) 2 2   Brachioradialis (C5/6) 2 2    Triceps (C6/7) 2 2    Patellar (L3/4) 2 2    Achilles (S1)      Hoffman      Plantar     Jaw jerk    Sensation:  Light touch Intact throughout   Pin prick    Temperature    Vibration   Proprioception    Coordination/Complex Motor:  - Finger to Nose intact BL - Heel to shin intact BL - Rapid alternating movement are normal - Gait: deferred.  Labs   CBC:  Recent Labs  Lab 01/29/22 1844  WBC 5.3  HGB 13.6  HCT 39.7  MCV 95.4  PLT 132*    Basic Metabolic Panel:  Lab Results  Component Value Date   NA 145 01/29/2022   K 3.7 01/29/2022   CO2 24 01/29/2022   GLUCOSE 99 01/29/2022   BUN 22 01/29/2022   CREATININE 1.81 (H) 01/29/2022   CALCIUM 9.3 01/29/2022   GFRNONAA 37 (L) 01/29/2022   GFRAA 56 (L) 08/04/2019   Lipid Panel: No results found for: "LDLCALC" HgbA1c: No results found for: "HGBA1C" Urine Drug Screen:     Component Value Date/Time   LABOPIA NONE DETECTED 04/02/2009 1155   COCAINSCRNUR NONE DETECTED 04/02/2009 1155   LABBENZ POSITIVE (A) 04/02/2009 1155   AMPHETMU NONE DETECTED 04/02/2009 1155   THCU NONE DETECTED 04/02/2009 1155   LABBARB  04/02/2009 1155    NONE DETECTED        DRUG SCREEN FOR MEDICAL PURPOSES ONLY.  IF CONFIRMATION IS NEEDED FOR ANY PURPOSE, NOTIFY LAB WITHIN 5 DAYS.        LOWEST DETECTABLE LIMITS FOR URINE DRUG SCREEN Drug Class       Cutoff (ng/mL) Amphetamine       1000 Barbiturate      200 Benzodiazepine   200 Tricyclics       300 Opiates          300 Cocaine          300 THC              50    Alcohol Level     Component Value Date/Time   ETH (H) 04/02/2009 1207    60        LOWEST DETECTABLE LIMIT FOR SERUM ALCOHOL IS 5 mg/dL FOR MEDICAL PURPOSES ONLY    CT Head without contrast(Personally reviewed): CTH was negative for a large hypodensity concerning for a large territory infarct or hyperdensity concerning for an ICH  Impression   Timothy Spencer is a 81 y.o. male with episode of confusion with poor attention and hallucinations. No focal deficit and back to his baseline. Labs with mild AKI and recently started on Losartan.  Low suspicion for stroke, do not think this was aphasia out of proportion to encephalopathy.  Recommendations  - no further inpatient neurological workup at this time. Low clinical concern for this being stroke. Further imaging likely low yield given he is back to his baseline  and CTH negative. Suspect this was likely toxic metabolic encephalopathy. ______________________________________________________________________   Thank you for the opportunity to take part in the care of this patient. If you have any further questions, please contact the neurology consultation attending.  Signed,  Erick Blinks [facility] Pager Number [phone] _ _ _   _ __   _ __ _ _  __ __   _ __   __ _

## 2022-01-29 NOTE — ED Provider Notes (Cosign Needed)
[facility] EMERGENCY DEPARTMENT Provider Note   CSN: [id number] Arrival date & time: 01/29/22  1828     History  Chief Complaint  Patient presents with   Altered Mental Status    Timothy Spencer is a 81 y.o. male. 81 year old male with past medical history of COPD, emphysema, and CAD presenting due to altered mental status.  Approximately 5 PM this afternoon during dinner patient suddenly became altered.  His wife was with him.  He began to act confused about what the food was and how to use his fork.  He then was very weak.  They called EMS.  He gradually returned to baseline over the next hour.  He is now completely back to baseline per his wife at bedside.  He denies any recent urinary symptoms, fever, cough, congestion, or change in any of his medications.  Denies any recent falls.  He does use a walker at baseline or long distances.  Altered Mental Status Associated symptoms: weakness   Associated symptoms: no abdominal pain, no fever, no palpitations, no rash, no seizures and no vomiting        Home Medications Prior to Admission medications   Medication Sig Start Date End Date Taking? Authorizing Provider  albuterol (VENTOLIN HFA) 108 (90 Base) MCG/ACT inhaler Inhale 2 puffs into the lungs every 6 (six) hours as needed for wheezing or shortness of breath. 01/17/22   Mannam, Praveen, MD  amLODipine (NORVASC) 10 MG tablet Take 10 mg by mouth daily. 10/10/21   [provider]  aspirin EC 81 MG tablet Take 81 mg by mouth daily.    [provider]  Budeson-Glycopyrrol-Formoterol (BREZTRI AEROSPHERE) 160-9-4.8 MCG/ACT AERO Inhale 2 puffs into the lungs in the morning and at bedtime. 11/22/21   Mannam, Colbert Coyer, MD  buPROPion (WELLBUTRIN XL) 150 MG 24 hr tablet Take 150 mg by mouth every morning. 07/17/21   [provider]  ipratropium-albuterol (DUONEB) 0.5-2.5 (3) MG/3ML SOLN Take 3 mLs by nebulization every 4 (four) hours as needed. 01/18/22    Mannam, Colbert Coyer, MD  isosorbide mononitrate (IMDUR) 60 MG 24 hr tablet TAKE 1 AND 1/2 TABLETS(90 MG) BY MOUTH DAILY 05/13/20   Lennette Bihari, MD  latanoprost (XALATAN) 0.005 % ophthalmic solution 1 drop at bedtime. 08/17/21   [provider]  levothyroxine (SYNTHROID) 112 MCG tablet Take 112 mcg by mouth daily. 07/29/19   [provider]  rOPINIRole (REQUIP) 1 MG tablet Take 1 mg by mouth 2 (two) times daily.  01/01/13   [provider]  rosuvastatin (CRESTOR) 5 MG tablet Take 5 mg by mouth every other day. 07/15/19   [provider]  sertraline (ZOLOFT) 100 MG tablet Take 100 mg by mouth daily. 01/25/13   [provider]  tamsulosin (FLOMAX) 0.4 MG CAPS capsule Take 0.4 mg by mouth daily.  01/31/13   [provider]      Allergies    Patient has no known allergies.    Review of Systems   Review of Systems  Constitutional:  Positive for activity change. Negative for chills and fever.  HENT:  Negative for ear pain and sore throat.   Eyes:  Negative for pain and visual disturbance.  Respiratory:  Negative for cough and shortness of breath.   Cardiovascular:  Negative for chest pain and palpitations.  Gastrointestinal:  Negative for abdominal pain and vomiting.  Genitourinary:  Negative for dysuria and hematuria.  Musculoskeletal:  Negative for arthralgias and back pain.  Skin:  Negative for color change and rash.  Neurological:  Positive for speech difficulty and weakness. Negative for seizures and syncope.  All other systems reviewed and are negative.   Physical Exam Updated Vital Signs BP (!) 116/58 (BP Location: Left Arm)   Pulse 77   Temp 97.7 F (36.5 C)   Resp 18   Ht 5\' 11"  (1.803 m)   Wt 78.9 kg   SpO2 100%   BMI 24.27 kg/m  Physical Exam Vitals and nursing note reviewed.  Constitutional:      General: He is not in acute distress.    Appearance: He is well-developed.  HENT:     Head: Normocephalic and atraumatic.   Eyes:     Conjunctiva/sclera: Conjunctivae normal.  Cardiovascular:     Rate and Rhythm: Normal rate and regular rhythm.     Heart sounds: No murmur heard. Pulmonary:     Effort: Pulmonary effort is normal. No respiratory distress.     Breath sounds: Normal breath sounds.  Abdominal:     Palpations: Abdomen is soft.     Tenderness: There is no abdominal tenderness.  Musculoskeletal:        General: No swelling.     Cervical back: Neck supple.  Skin:    General: Skin is warm and dry.     Capillary Refill: Capillary refill takes less than 2 seconds.  Neurological:     Mental Status: He is alert.     GCS: GCS eye subscore is 4. GCS verbal subscore is 5. GCS motor subscore is 6.     Cranial Nerves: Cranial nerves 2-12 are intact.     Sensory: Sensation is intact.     Motor: Motor function is intact.     Coordination: Coordination is intact.     Gait: Gait is intact.  Psychiatric:        Mood and Affect: Mood normal.     ED Results / Procedures / Treatments   Labs (all labs ordered are listed, but only abnormal results are displayed) Labs Reviewed  COMPREHENSIVE METABOLIC PANEL - Abnormal; Notable for the following components:      Result Value   Creatinine, Ser 1.81 (*)    Total Protein 6.1 (*)    GFR, Estimated 37 (*)    All other components within normal limits  CBC - Abnormal; Notable for the following components:   RBC 4.16 (*)    Platelets 132 (*)    All other components within normal limits  CBG MONITORING, ED - Abnormal; Notable for the following components:   Glucose-Capillary 104 (*)    All other components within normal limits    EKG EKG Interpretation  Date/Time:  Sunday January 29 2022 18:38:40 EDT Ventricular Rate:  76 PR Interval:  236 QRS Duration: 138 QT Interval:  418 QTC Calculation: 470 R Axis:   -55 Text Interpretation: Sinus rhythm with 1st degree A-V block Right bundle branch block Left anterior fascicular block Minimal voltage criteria  for LVH, may be normal variant ( R in aVL ) Possible Lateral infarct , age undetermined Abnormal ECG Poor R wave progression When compared with ECG of 24-Nov-2014 08:13, PREVIOUS ECG IS PRESENT Confirmed by 26-Nov-2014 (Eber Hong) on 01/29/2022 7:21:16 PM  Radiology CT Head Wo Contrast  Result Date: 01/29/2022 CLINICAL DATA:  Altered mental status. EXAM: CT HEAD WITHOUT CONTRAST TECHNIQUE: Contiguous axial images were obtained from the base of the skull through the vertex without intravenous contrast. RADIATION DOSE REDUCTION: This exam was performed  according to the departmental dose-optimization program which includes automated exposure control, adjustment of the mA and/or kV according to patient size and/or use of iterative reconstruction technique. COMPARISON:  None Available. FINDINGS: Brain: Mild age-related atrophy and chronic microvascular ischemic changes. There is no acute intracranial hemorrhage. No mass effect or midline shift. No extra-axial fluid collection. Vascular: No hyperdense vessel or unexpected calcification. Skull: Normal. Negative for fracture or focal lesion. Sinuses/Orbits: Small bilateral maxillary sinus retention cysts or polyps. The remainder of the visualized paranasal sinuses and mastoid air cells are clear. Other: None IMPRESSION: 1. No acute intracranial pathology. 2. Mild age-related atrophy and chronic microvascular ischemic changes. Electronically Signed   By: Elgie Collard M.D.   On: 01/29/2022 20:29    Procedures Procedures    Medications Ordered in ED Medications  lactated ringers bolus 1,000 mL (1,000 mLs Intravenous New Bag/Given 01/29/22 2052)    ED Course/ Medical Decision Making/ A&P                           Medical Decision Making Amount and/or Complexity of Data Reviewed Labs: ordered. Radiology: ordered.  Risk Decision regarding hospitalization.   81 year old male with past medical history of CAD, prior smoker, COPD, emphysema presenting with  altered mental status.  At approximately 5 PM during dinner patient became confused and had difficulty recognizing objects and using his fork.  He returned to baseline over the following hour.  On my evaluation patient is at baseline per patient and wife at bedside.  He is not any focal deficits.  He denies any history of prior stroke.  Differential diagnosis includes TIA, CVA, infection, AKI, electrolyte abnormalities, hypoglycemia.  Labs reviewed and notable for creatinine mildly elevated to 1.8.  No leukocytosis, anemia, or significant electrolyte abnormalities.  Glucose 104.  CT head did not show any acute hemorrhage or large infarction, did show age-related atrophy and chronic changes.  Based on history and patient's current reassuring physical exam, concern for TIA.  ABCD2 low risk.   Neurology team consulted who agreed with concern for TIA and further work-up.  MRI and additional imaging ordered by neurology team.  Patient was admitted to the hospitalist service for TIA work-up and medical optimization.  Patient was admitted, however once the neurology team evaluated the patient they did not feel this was consistent with a TIA and did not think further work-up for this was warranted at this time.  This was discussed with the neurology attending and hospitalist attending after each of their evaluation of the patient, who are all in agreement that additional work-up would not be beneficial at this time as patient has remained stable and at his baseline.  This was discussed by all teams and with the patient and spouse at bedside who are in agreement and agreeable to this plan.  Patient will follow-up closely with his primary doctor.  Patient did have a mild elevation in his creatinine and was given fluids.  Suspect this could be due to his newly initiated losartan medication, so he was recommended to discontinue this and follow-up closely with the primary doctor in order for lab recheck and further  discussion on antihypertensive medication.  Strict return precautions were given, all questions answered.  Final Clinical Impression(s) / ED Diagnoses Final diagnoses:  Transient alteration of awareness    Rx / DC Orders ED Discharge Orders     None         Kela Millin, MD  01/29/22 2130    Kela Millin, MD 01/30/22 Marcie Bal    Eber Hong, MD 01/30/22 2040

## 2022-01-29 NOTE — ED Triage Notes (Signed)
Per [facility] pt coming from home- wife called ems stating patient started having AMS during dinner. States patient was not making sense when speaking. Wife states LKN around 4:40 this afternoon. Patient is A&0x4 on arrival but pt does not remember the incident. States patient had a shuffling gait in house which is abnormal for him.

## 2022-01-30 NOTE — Discharge Instructions (Signed)
You were evaluated in the Emergency Department and after careful evaluation, we did not find any emergent condition requiring admission or further testing in the hospital.  Your exam/testing today was overall reassuring.  You were evaluated by the neurology team and admission was considered but we do not feel that you had a stroke.  Recommend close monitoring of any similar symptoms at home, follow-up with your regular doctors.  Please return to the Emergency Department if you experience any worsening of your condition.  Thank you for allowing [place] to be a part of your care.

## 2022-01-31 DIAGNOSIS — I2510 Atherosclerotic heart disease of native coronary artery without angina pectoris: Secondary | ICD-10-CM | POA: Diagnosis not present

## 2022-01-31 DIAGNOSIS — N1832 Chronic kidney disease, stage 3b: Secondary | ICD-10-CM | POA: Diagnosis not present

## 2022-01-31 DIAGNOSIS — G459 Transient cerebral ischemic attack, unspecified: Secondary | ICD-10-CM | POA: Diagnosis not present

## 2022-01-31 DIAGNOSIS — H409 Unspecified glaucoma: Secondary | ICD-10-CM | POA: Diagnosis not present

## 2022-02-06 DIAGNOSIS — H353114 Nonexudative age-related macular degeneration, right eye, advanced atrophic with subfoveal involvement: Secondary | ICD-10-CM | POA: Diagnosis not present

## 2022-02-06 DIAGNOSIS — H43822 Vitreomacular adhesion, left eye: Secondary | ICD-10-CM | POA: Diagnosis not present

## 2022-02-06 DIAGNOSIS — H35371 Puckering of macula, right eye: Secondary | ICD-10-CM | POA: Diagnosis not present

## 2022-02-06 DIAGNOSIS — H353221 Exudative age-related macular degeneration, left eye, with active choroidal neovascularization: Secondary | ICD-10-CM | POA: Diagnosis not present

## 2022-02-08 ENCOUNTER — Other Ambulatory Visit (HOSPITAL_BASED_OUTPATIENT_CLINIC_OR_DEPARTMENT_OTHER): Payer: Self-pay | Admitting: Internal Medicine

## 2022-02-08 ENCOUNTER — Other Ambulatory Visit: Payer: Self-pay | Admitting: Internal Medicine

## 2022-02-08 ENCOUNTER — Other Ambulatory Visit: Payer: Self-pay | Admitting: *Deleted

## 2022-02-08 DIAGNOSIS — G459 Transient cerebral ischemic attack, unspecified: Secondary | ICD-10-CM

## 2022-02-08 MED ORDER — ALBUTEROL SULFATE HFA 108 (90 BASE) MCG/ACT IN AERS: 2.0000 | INHALATION_SPRAY | Freq: Four times a day (QID) | RESPIRATORY_TRACT | 11 refills | Status: DC | PRN

## 2022-02-16 DIAGNOSIS — G459 Transient cerebral ischemic attack, unspecified: Secondary | ICD-10-CM | POA: Diagnosis not present

## 2022-02-17 ENCOUNTER — Ambulatory Visit (HOSPITAL_BASED_OUTPATIENT_CLINIC_OR_DEPARTMENT_OTHER): Payer: Medicare Other

## 2022-02-24 ENCOUNTER — Ambulatory Visit (HOSPITAL_BASED_OUTPATIENT_CLINIC_OR_DEPARTMENT_OTHER)
Admission: RE | Admit: 2022-02-24 | Discharge: 2022-02-24 | Disposition: A | Discharge status: Home / Self Care | Payer: Medicare Other | Source: Ambulatory Visit | Attending: Internal Medicine | Admitting: Internal Medicine

## 2022-02-24 DIAGNOSIS — G319 Degenerative disease of nervous system, unspecified: Secondary | ICD-10-CM | POA: Diagnosis not present

## 2022-02-24 DIAGNOSIS — M2578 Osteophyte, vertebrae: Secondary | ICD-10-CM | POA: Insufficient documentation

## 2022-02-24 DIAGNOSIS — Z8673 Personal history of transient ischemic attack (TIA), and cerebral infarction without residual deficits: Secondary | ICD-10-CM | POA: Insufficient documentation

## 2022-02-24 DIAGNOSIS — J3489 Other specified disorders of nose and nasal sinuses: Secondary | ICD-10-CM | POA: Diagnosis not present

## 2022-02-24 DIAGNOSIS — M4802 Spinal stenosis, cervical region: Secondary | ICD-10-CM | POA: Insufficient documentation

## 2022-02-24 DIAGNOSIS — I6782 Cerebral ischemia: Secondary | ICD-10-CM | POA: Insufficient documentation

## 2022-02-24 DIAGNOSIS — M47812 Spondylosis without myelopathy or radiculopathy, cervical region: Secondary | ICD-10-CM | POA: Diagnosis not present

## 2022-02-24 DIAGNOSIS — G459 Transient cerebral ischemic attack, unspecified: Secondary | ICD-10-CM | POA: Diagnosis not present

## 2022-02-28 DIAGNOSIS — Z23 Encounter for immunization: Secondary | ICD-10-CM | POA: Diagnosis not present

## 2022-02-28 DIAGNOSIS — I10 Essential (primary) hypertension: Secondary | ICD-10-CM | POA: Diagnosis not present

## 2022-03-01 DIAGNOSIS — I10 Essential (primary) hypertension: Secondary | ICD-10-CM | POA: Diagnosis not present

## 2022-03-09 DIAGNOSIS — E039 Hypothyroidism, unspecified: Secondary | ICD-10-CM | POA: Diagnosis not present

## 2022-03-09 DIAGNOSIS — I10 Essential (primary) hypertension: Secondary | ICD-10-CM | POA: Diagnosis not present

## 2022-03-09 DIAGNOSIS — N1832 Chronic kidney disease, stage 3b: Secondary | ICD-10-CM | POA: Diagnosis not present

## 2022-03-13 DIAGNOSIS — Z0000 Encounter for general adult medical examination without abnormal findings: Secondary | ICD-10-CM | POA: Diagnosis not present

## 2022-03-13 DIAGNOSIS — M109 Gout, unspecified: Secondary | ICD-10-CM | POA: Diagnosis not present

## 2022-03-13 DIAGNOSIS — L719 Rosacea, unspecified: Secondary | ICD-10-CM | POA: Diagnosis not present

## 2022-03-13 DIAGNOSIS — J432 Centrilobular emphysema: Secondary | ICD-10-CM | POA: Diagnosis not present

## 2022-03-20 DIAGNOSIS — N400 Enlarged prostate without lower urinary tract symptoms: Secondary | ICD-10-CM | POA: Diagnosis not present

## 2022-03-20 DIAGNOSIS — E785 Hyperlipidemia, unspecified: Secondary | ICD-10-CM | POA: Diagnosis not present

## 2022-03-20 DIAGNOSIS — R0609 Other forms of dyspnea: Secondary | ICD-10-CM | POA: Diagnosis not present

## 2022-03-20 DIAGNOSIS — J432 Centrilobular emphysema: Secondary | ICD-10-CM | POA: Diagnosis not present

## 2022-03-20 DIAGNOSIS — R0602 Shortness of breath: Secondary | ICD-10-CM | POA: Diagnosis not present

## 2022-03-20 DIAGNOSIS — J984 Other disorders of lung: Secondary | ICD-10-CM | POA: Diagnosis not present

## 2022-03-20 DIAGNOSIS — E039 Hypothyroidism, unspecified: Secondary | ICD-10-CM | POA: Diagnosis not present

## 2022-03-20 DIAGNOSIS — Z87891 Personal history of nicotine dependence: Secondary | ICD-10-CM | POA: Diagnosis not present

## 2022-03-20 DIAGNOSIS — J986 Disorders of diaphragm: Secondary | ICD-10-CM | POA: Diagnosis not present

## 2022-03-20 DIAGNOSIS — I10 Essential (primary) hypertension: Secondary | ICD-10-CM | POA: Diagnosis not present

## 2022-03-22 DIAGNOSIS — N261 Atrophy of kidney (terminal): Secondary | ICD-10-CM | POA: Diagnosis not present

## 2022-03-22 DIAGNOSIS — N1832 Chronic kidney disease, stage 3b: Secondary | ICD-10-CM | POA: Diagnosis not present

## 2022-03-22 DIAGNOSIS — N189 Chronic kidney disease, unspecified: Secondary | ICD-10-CM | POA: Diagnosis not present

## 2022-03-22 DIAGNOSIS — N281 Cyst of kidney, acquired: Secondary | ICD-10-CM | POA: Diagnosis not present

## 2022-03-27 ENCOUNTER — Telehealth: Payer: Self-pay | Admitting: Pulmonary Disease

## 2022-03-27 NOTE — Telephone Encounter (Signed)
Hey Timothy Spencer,  I called this patient and he states he was wondering if you received any paperwork from [place] yet about his care. I told patient I would check with you and if you dont have anything we will requested what you need from [facility].   I made him a follow up for   04/12/2022 at 2:45pm  Thank you

## 2022-03-28 NOTE — Telephone Encounter (Signed)
I did not see any papers from [facility] on patient.  ATC patient.  No answer and no VM.  Will try again tomorrow.

## 2022-03-29 NOTE — Telephone Encounter (Signed)
Pt called the office and I let him know that we had not received any paperwork from [facility] and he verbalized understanding. Pt said he would try to reach out to [facility] to get reports sent over.

## 2022-03-30 NOTE — Progress Notes (Signed)
Cardiology Clinic Note   Patient Name: Timothy Spencer Date of Encounter: 03/31/2022  Primary Care Provider:  Merri Brunette, MD Primary Cardiologist:  Nicki Guadalajara, MD  Patient Profile    Timothy Spencer 81 year old male presents to the clinic today for follow-up evaluation of his coronary artery disease and first-degree AV block.  Past Medical History    Past Medical History:  Diagnosis Date   Anxiety and depression    BPH (benign prostatic hyperplasia)    CAD (coronary artery disease)    COPD (chronic obstructive pulmonary disease) (HCC)    DOE (dyspnea on exertion)    ED (erectile dysfunction)    Emphysema of lung (HCC)    First degree AV block    GERD (gastroesophageal reflux disease)    Glaucoma    Gout    Gynecomastia    H/O ETOH abuse    Hemochromatosis    Hyperlipidemia    Hypertension    Hypothyroidism    Macular degeneration    OSA (obstructive sleep apnea)    Osteopenia    Peripheral neuropathy    RLS (restless legs syndrome)    Thrombocytopenia (HCC)    Past Surgical History:  Procedure Laterality Date   CARDIAC CATHETERIZATION  03/27/2008   Medical therapy   CARDIAC CATHETERIZATION  06/03/2010   Medical theray and smoking cessation   CARDIAC CATHETERIZATION N/A 11/24/2014   Procedure: Left Heart Cath and Coronary Angiography;  Surgeon: Lennette Bihari, MD;  Location: [facility];  Service: Cardiovascular;  Laterality: N/A;   CARDIOPULMONARY EXERCISE TEST  02/12/2012   Peak VO2 63% of predicted   ORIF FEMUR FRACTURE Right    TRANSTHORACIC ECHOCARDIOGRAM  11/28/2011   EF >55%, normal    Allergies  No Known Allergies  History of Present Illness    Timothy Spencer has a PMH of first-degree AV block, CAD, HTN, COPD, hypothyroidism, AKI, HLD, fatigue, and exertional dyspnea.  He underwent cardiac catheterization 10/9 which showed vigorous left ventricular contractility with LVH and significant mid LAD bridging.  The vessel appeared normal  during diastole and narrowed to 95% in the intramyocardial segment during systole.  He was noted to have slight progression of his circumflex with 50-60% and 30-40% proximal stenosis.  He stopped smoking 5/12.  He underwent stress testing 6/16 showed unchanged dynamic systolic bridging in the mid LAD segment with an 80-90% narrowing during systole and 0% during diastole.  Circumflex had 50% stenosis.  He was seen in follow-up by Dr. Tresa Endo on 10/12/2021.  He continued to do well at that time.  He had been exercising 5 days/week, using treadmill for approximately 20 minutes.  He did note mild shortness of breath with stair climbing.  He was followed by pulmonology for his emphysema/COPD.  He was scheduled for pulmonary function testing.  He denied recurrent anginal symptoms.  His high-resolution chest CT 4/23 was unchanged from previous.  He was seen in the emergency department on 01/29/2022 and noted to have a transient alteration of awareness.  His EKG showed first-degree AV block with right bundle branch block and minimal voltage criteria for LVH 76 bpm.  During his evening meal he had a sudden change in his mental status.  His wife was with him.  He began to act confused.  He became weak.  EMS was called.  He gradually returned to baseline over the next hour.  He denied fever cough congestion and changes in his medications.  He denied recent falls.  He continued to use his walker for long distance.  Head CT did not show acute changes.  It did note age-related atrophy and chronic changes.  It was felt that he had a TIA.  Neurology was consulted and agreed.  MRI was ordered.  His creatinine was mildly elevated and he received IV fluids.  He presents to the clinic today for follow-up evaluation states he feels well.  He continues to exercise regularly.  He has been exercising for at least 20 minutes 4 days/week.  He does note some work of breathing with exercise but reports his breathing has been stable.  We  reviewed his recent trip to the emergency department.  He has had no further episodes of altered mental status.  We reviewed his last cardiac catheterization expressed understanding.  No changes with recent EKG.  His blood pressure today is 124/64.  I will give him the salty 6 diet sheet, have him maintain his physical activity, plan follow-up in 6 months with Dr. Tresa Endo.   Today he denies chest pain, shortness of breath, lower extremity edema, fatigue, palpitations, melena, hematuria, hemoptysis, diaphoresis, weakness, presyncope, syncope, orthopnea, and PND.     Home Medications    Prior to Admission medications   Medication Sig Start Date End Date Taking? Authorizing Provider  albuterol (VENTOLIN HFA) 108 (90 Base) MCG/ACT inhaler Inhale 2 puffs into the lungs every 6 (six) hours as needed for wheezing or shortness of breath. 02/08/22   Mannam, Colbert Coyer, MD  amLODipine (NORVASC) 10 MG tablet Take 10 mg by mouth daily. 10/10/21   [provider]  aspirin EC 81 MG tablet Take 81 mg by mouth daily.    [provider]  Budeson-Glycopyrrol-Formoterol (BREZTRI AEROSPHERE) 160-9-4.8 MCG/ACT AERO Inhale 2 puffs into the lungs in the morning and at bedtime. 11/22/21   Mannam, Colbert Coyer, MD  buPROPion (WELLBUTRIN XL) 150 MG 24 hr tablet Take 150 mg by mouth every morning. 07/17/21   [provider]  ipratropium-albuterol (DUONEB) 0.5-2.5 (3) MG/3ML SOLN Take 3 mLs by nebulization every 4 (four) hours as needed. 01/18/22   Mannam, Colbert Coyer, MD  isosorbide mononitrate (IMDUR) 60 MG 24 hr tablet TAKE 1 AND 1/2 TABLETS(90 MG) BY MOUTH DAILY 05/13/20   Lennette Bihari, MD  latanoprost (XALATAN) 0.005 % ophthalmic solution 1 drop at bedtime. 08/17/21   [provider]  levothyroxine (SYNTHROID) 112 MCG tablet Take 112 mcg by mouth daily. 07/29/19   [provider]  rOPINIRole (REQUIP) 1 MG tablet Take 1 mg by mouth 2 (two) times daily.  01/01/13   [provider]   rosuvastatin (CRESTOR) 5 MG tablet Take 5 mg by mouth every other day. 07/15/19   [provider]  sertraline (ZOLOFT) 100 MG tablet Take 100 mg by mouth daily. 01/25/13   [provider]  tamsulosin (FLOMAX) 0.4 MG CAPS capsule Take 0.4 mg by mouth daily.  01/31/13   [provider]    Family History    Family History  Adopted: Yes   is adopted.   Social History    Social History   Socioeconomic History   Marital status: Married    Spouse name: Not on file   Number of children: Not on file   Years of education: Not on file   Highest education level: Not on file  Occupational History   Not on file  Tobacco Use   Smoking status: Former    Packs/day: 2.00    Years: 50.00    Total  pack years: 100.00    Types: Cigarettes    Start date: 04/11/1960    Quit date: 03/19/2011    Years since quitting: 11.0   Smokeless tobacco: Never   Tobacco comments:    Quit for 1 year total   Substance and Sexual Activity   Alcohol use: No    Alcohol/week: 0.0 standard drinks of alcohol   Drug use: No   Sexual activity: Not on file  Other Topics Concern   Not on file  Social History Narrative   [facility] (03/06/17):   Originally from [place], [place]. Has always lived in [place]. Has traveled to [place], [place], [place], [place], [place], & [place]. Has a dog currently. No bird exposure. No mold or hot tub exposure. Previously enjoyed riding horses and playing golf. Currently has no hobbies. 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    Social Determinants of Health   Financial Resource Strain: Not on file  Food Insecurity: Not on file  Transportation Needs: Not on file  Physical Activity: Not on file  Stress: Not on file  Social Connections: Not on file  Intimate Partner Violence: Not on file     Review of Systems    General:  No chills, fever, night sweats or weight changes.  Cardiovascular:  No chest pain,  dyspnea on exertion, edema, orthopnea, palpitations, paroxysmal nocturnal dyspnea. Dermatological: No rash, lesions/masses Respiratory: No cough, dyspnea Urologic: No hematuria, dysuria Abdominal:   No nausea, vomiting, diarrhea, bright red blood per rectum, melena, or hematemesis Neurologic:  No visual changes, wkns, changes in mental status. All other systems reviewed and are otherwise negative except as noted above.  Physical Exam    VS:  BP 124/64   Pulse 91   Ht 5\' 11"  (1.803 m)   Wt 174 lb (78.9 kg)   SpO2 100%   BMI 24.27 kg/m  , BMI Body mass index is 24.27 kg/m. GEN: Well nourished, well developed, in no acute distress. HEENT: normal. Neck: Supple, no JVD, carotid bruits, or masses. Cardiac: RRR, no murmurs, rubs, or gallops. No clubbing, cyanosis, edema.  Radials/DP/PT 2+ and equal bilaterally.  Respiratory:  Respirations regular and unlabored, clear to auscultation bilaterally. GI: Soft, nontender, nondistended, BS + x 4. MS: no deformity or atrophy. Skin: warm and dry, no rash. Neuro:  Strength and sensation are intact. Psych: Normal affect.  Accessory Clinical Findings    Recent Labs: 01/29/2022: ALT 11; BUN 22; Creatinine, Ser 1.81; Hemoglobin 13.6; Platelets 132; Potassium 3.7; Sodium 145   Recent Lipid Panel No results found for: "CHOL", "TRIG", "HDL", "CHOLHDL", "VLDL", "LDLCALC", "LDLDIRECT"       ECG personally reviewed by me today-none today.  Echocardiogram 10/28/2021 IMPRESSIONS     1. Left ventricular ejection fraction, by estimation, is 60 to 65%. The  left ventricle has normal function. The left ventricle has no regional  wall motion abnormalities. Left ventricular diastolic parameters are  consistent with Grade I diastolic  dysfunction (impaired relaxation).   2. Right ventricular systolic function is normal. The right ventricular  size is normal.   3. The mitral valve is normal in structure. No evidence of mitral valve  regurgitation. No  evidence of mitral stenosis.   4. The aortic valve is normal in structure. Aortic valve regurgitation is  not visualized. No aortic stenosis is present.   5. The inferior vena cava is normal in size with greater than 50%  respiratory variability, suggesting  right atrial pressure of 3 mmHg.   FINDINGS   Left Ventricle: Left ventricular ejection fraction, by estimation, is 60  to 65%. The left ventricle has normal function. The left ventricle has no  regional wall motion abnormalities. The left ventricular internal cavity  size was normal in size. There is   no left ventricular hypertrophy. Left ventricular diastolic parameters  are consistent with Grade I diastolic dysfunction (impaired relaxation).   Right Ventricle: The right ventricular size is normal. No increase in  right ventricular wall thickness. Right ventricular systolic function is  normal.   Left Atrium: Left atrial size was normal in size.   Right Atrium: Right atrial size was normal in size.   Pericardium: There is no evidence of pericardial effusion.   Mitral Valve: The mitral valve is normal in structure. No evidence of  mitral valve regurgitation. No evidence of mitral valve stenosis.   Tricuspid Valve: The tricuspid valve is normal in structure. Tricuspid  valve regurgitation is not demonstrated. No evidence of tricuspid  stenosis.   Aortic Valve: The aortic valve is normal in structure. Aortic valve  regurgitation is not visualized. No aortic stenosis is present.   Pulmonic Valve: The pulmonic valve was normal in structure. Pulmonic valve  regurgitation is trivial. No evidence of pulmonic stenosis.   Aorta: The aortic root is normal in size and structure.   Venous: The inferior vena cava is normal in size with greater than 50%  respiratory variability, suggesting right atrial pressure of 3 mmHg.   IAS/Shunts: No atrial level shunt detected by color flow Doppler.   Nuclear stress test  10/28/2014  Myocardial perfusion is abnormal. There is a large, severe fixed inferior defect. Findings consistent with prior myocardial infarction, however, there does appear to be inferior wall motion. This is an intermediate risk study. Overall left ventricular systolic function was normal. LV cavity size is normal. The left ventricular ejection fraction is hyperdynamic (>65%). There is no prior study for comparison. Clinical correlation is advised    Assessment & Plan   1.  Coronary artery disease-no chest pain today.  Underwent nuclear stress testing 6/16 which showed two-vessel disease with 50% mid circumflex stenosis and 90% mid-distal LAD lesion during systole and 0% stenosis during diastole.  Medical management was recommended. Continue aspirin, amlodipine, Imdur, rosuvastatin Heart healthy low-sodium diet-salty 6 given Increase physical activity as tolerated   Hyperlipidemia-LDL 68,05/11/21 Continue aspirin, rosuvastatin Heart healthy low-sodium high-fiber diet Increase physical activity as tolerated   Essential hypertension-BP today 124/64 Continue amlodipine, Imdur Heart healthy low-sodium diet-salty 6 given Increase physical activity as tolerated   First-degree AV block-heart rate today91.  Denies episodes of lightheadedness, presyncope and syncope. Continue to monitor  DOE-stable.  Chronic.  Continues to be physically active walking on the treadmill several days per week. Continue current physical activity Maintain diet  COPD-continues to note dyspnea with increased physical activity.  Normal breathing with normal daily activities. Follow with pulmonary  Disposition: Follow-up with Dr. Tresa Endo or me in 6 months.   Thomasene Ripple. Cylas Falzone NP-C     03/31/2022, 8:35 AM [facility] [address] Office [phone] Fax [phone]    I spent 13 minutes examining this patient, reviewing medications, and using patient centered  shared decision making involving her cardiac care.  Prior to her visit I spent greater than 20 minutes reviewing her past medical history,  medications, and prior cardiac tests.

## 2022-03-31 ENCOUNTER — Ambulatory Visit: Payer: Medicare Other | Attending: Cardiovascular Disease | Admitting: General Practice

## 2022-03-31 ENCOUNTER — Encounter: Payer: Self-pay | Admitting: General Practice

## 2022-03-31 VITALS — BP 124/64 | HR 91 | Ht 71.0 in | Wt 174.0 lb

## 2022-03-31 DIAGNOSIS — R0609 Other forms of dyspnea: Secondary | ICD-10-CM

## 2022-03-31 DIAGNOSIS — I10 Essential (primary) hypertension: Secondary | ICD-10-CM

## 2022-03-31 DIAGNOSIS — I2510 Atherosclerotic heart disease of native coronary artery without angina pectoris: Secondary | ICD-10-CM

## 2022-03-31 DIAGNOSIS — E785 Hyperlipidemia, unspecified: Secondary | ICD-10-CM | POA: Diagnosis not present

## 2022-03-31 DIAGNOSIS — J449 Chronic obstructive pulmonary disease, unspecified: Secondary | ICD-10-CM

## 2022-03-31 NOTE — Patient Instructions (Signed)
Medication Instructions:  The current medical regimen is effective;  continue present plan and medications as directed. Please refer to the Current Medication list given to you today.  *If you need a refill on your cardiac medications before your next appointment, please call your pharmacy*  Lab Work: NONE If you have labs (blood work) drawn today and your tests are completely normal, you will receive your results only by:  [place] (if you have MyChart) OR  A paper copy in the mail  If you have any lab test that is abnormal or we need to change your treatment, we will call you to review the results.  Testing/Procedures: NONE  Follow-Up: At [facility], you and your health needs are our priority.  As part of our continuing mission to provide you with exceptional heart care, we have created designated Provider Care Teams.  These Care Teams include your primary Cardiologist (physician) and Advanced Practice Providers (APPs -  Physician Assistants and Nurse Practitioners) who all work together to provide you with the care you need, when you need it.  We recommend signing up for the patient portal called "MyChart".  Sign up information is provided on this After Visit Summary.  MyChart is used to connect with patients for Virtual Visits (Telemedicine).  Patients are able to view lab/test results, encounter notes, upcoming appointments, etc.  Non-urgent messages can be sent to your provider as well.   To learn more about what you can do with MyChart, go to [email or web address].    Your next appointment:   6 month(s)  The format for your next appointment:   In Person  Provider:   Shelva Majestic, MD     Other Instructions PLEASE READ AND FOLLOW ATTACHED  SALTY 6  [place]

## 2022-04-12 ENCOUNTER — Encounter: Payer: Self-pay | Admitting: Pulmonary Disease

## 2022-04-12 ENCOUNTER — Ambulatory Visit: Payer: Medicare Other | Admitting: Pulmonary Disease

## 2022-04-12 VITALS — BP 138/72 | HR 83 | Temp 97.7°F | Ht 71.0 in | Wt 172.2 lb

## 2022-04-12 DIAGNOSIS — J449 Chronic obstructive pulmonary disease, unspecified: Secondary | ICD-10-CM

## 2022-04-12 NOTE — Patient Instructions (Signed)
Glad you are stable with your breathing Continue the breztri inhaler and albuterol as needed We will try to obtain records from [facility] regarding your recent visit Follow-up in 6 months.

## 2022-04-12 NOTE — Progress Notes (Addendum)
Timothy Spencer    [id number]    03-21-1941  Primary Care Physician:Pharr, Zollie Beckers, MD  Referring Physician: Merri Brunette, MD [address] [place],  [place] [address]  Chief complaint: Follow-up for dyspnea, emphysema  HPI: 81 year old with moderate COPD, hypertension, hypothyroidism, restless leg syndrome, coronary artery disease.  Seen at [facility] pulmonary for evaluation of dyspnea in 2017.  He had a cardiopulmonary exercise test done there which showed deconditioning.  Also has history of right bundle branch block, coronary artery disease and follows with cardiology on a regular basis. He has enrolled in an exercise program and works out 4 times a week.  Reports marked improvement in symptoms of dyspnea. Completed rehab program in 2020  Over 100 pack year of smoking in Quit 2015  Interim history: Continues to be active exercising on treadmill and stationary cycle But notes increasing dyspnea over the past year  He had repeat CT scan, labs and PFTs in 2023 for worsening dyspnea did not show significant interstitial lung disease and PFTs actually showed an improvement in lung volumes  He has been referred to [facility] for evaluation of endobronchial lung volume reduction.  He had a visit last month but I do not see notes in Care Everywhere.  Apparently has been told he is not a candidate due to elevated right hemidiaphragm.  Outpatient Encounter Medications as of 04/12/2022  Medication Sig   albuterol (VENTOLIN HFA) 108 (90 Base) MCG/ACT inhaler Inhale 2 puffs into the lungs every 6 (six) hours as needed for wheezing or shortness of breath.   amLODipine (NORVASC) 10 MG tablet Take 10 mg by mouth daily.   aspirin EC 81 MG tablet Take 81 mg by mouth daily.   Budeson-Glycopyrrol-Formoterol (BREZTRI AEROSPHERE) 160-9-4.8 MCG/ACT AERO Inhale 2 puffs into the lungs in the morning and at bedtime.   buPROPion (WELLBUTRIN XL) 150 MG 24 hr tablet Take 150 mg by mouth  every morning.   ipratropium-albuterol (DUONEB) 0.5-2.5 (3) MG/3ML SOLN Take 3 mLs by nebulization every 4 (four) hours as needed.   isosorbide mononitrate (IMDUR) 60 MG 24 hr tablet TAKE 1 AND 1/2 TABLETS(90 MG) BY MOUTH DAILY   latanoprost (XALATAN) 0.005 % ophthalmic solution 1 drop at bedtime.   levothyroxine (SYNTHROID) 112 MCG tablet Take 112 mcg by mouth daily.   rOPINIRole (REQUIP) 1 MG tablet Take 1 mg by mouth 2 (two) times daily.    rosuvastatin (CRESTOR) 5 MG tablet Take 5 mg by mouth every other day.   sertraline (ZOLOFT) 100 MG tablet Take 100 mg by mouth daily.   tamsulosin (FLOMAX) 0.4 MG CAPS capsule Take 0.4 mg by mouth daily.    No facility-administered encounter medications on file as of 04/12/2022.   Physical Exam: Blood pressure 138/72, pulse 83, temperature 97.7 F (36.5 C), temperature source Oral, height 5\' 11"  (1.803 m), weight 172 lb 3.2 oz (78.1 kg), SpO2 99 %. Gen:      No acute distress HEENT:  EOMI, sclera anicteric Neck:     No masses; no thyromegaly Lungs:    Clear to auscultation bilaterally; normal respiratory effort CV:         Regular rate and rhythm; no murmurs Abd:      + bowel sounds; soft, non-tender; no palpable masses, no distension Ext:    No edema; adequate peripheral perfusion Skin:      Warm and dry; no rash Neuro: alert and oriented x 3 Psych: normal mood and  affect   Data Reviewed: PFTs  06/26/2018 FVC 2.93 [55%], FEV1 2.03 [60%], F/F 69, TLC 5.54 (10 4%), DLCO 16.26 (46%)  01/14/2021 FVC 2.77 (63%], FEV1 2.00 [63%], F/F 72, TLC 5.26 [90%], DLCO 14.78 [10%] Mild restriction, severe diffusion defect  11/22/2021 FVC 2.26 [52%], FEV1 1.67 [54%], F/F 74, TLC 5.39 [72%], DLCO 15.50 [60%] Mild restriction, moderate diffusion  04/11/17:  Walked 312 Meters / Baseline Sat 100% on RA / Nadir Sat 98% on RA   Imaging CT chest 10/11/16- 4 mm left lower lobe nodule.  Previously noted lower lobe opacities have resolved.  Centrilobular  emphysema.  Pleural thickening versus small effusion on the left CT chest 07/13/16-6 mm left lower lobe nodule.  9 mm left lower lobe nodule.  4 mm left lower lobe nodule.   Questionable pleural thickening versus pleural effusion CT 10/12/2017- Emphysema, stable pulmonary nodules.  Pleural-parenchymal scarring at the right hemithorax. Chest x-ray 06/26/18-chronic right hemidiaphragm elevation with mild atelectasis. High-res CT 09/07/2021-irregular peripheral opacities and septal thickening, right hemidiaphragm elevation, emphysema. I have reviewed the images personally.  ESOPHAGRAM/BARIUM SWALLOW 09/10/15:  Small hiatal hernia with mild stricture at the gastroesophageal junction. Negative for reflux.    LABS Alpha-1 antitrypsin 03/06/2017-159, MS phenotype Alpha-1 antitrypsin 06/06/2018-156 Alpha-1 antitrypsin 08/10/2021-148  CBC 06/26/2018-WBC 7.4, eos 2%, absolute eosinophil count 148 CBC 08/10/2021-WBC 7.6, hemoglobin 15.1, eos 2.2% IgE 149  01/29/09 ANA:  Negative RA:  79  Cardiac: Cardio pulmonary Exercise stress test 2017 from [facility] This cardiopulmonary exercise test demonstrated a decreased exercise capacity. The patient did not demonstrate a pulmonary limitation. Given the patient's anaerobic threshold > 40% with increased heart rate reserve, poor effort or deconditioning can account for the patient's symptoms. However, the patient did display a reduced oxygen pulse which is an indirect measure of stroke volume which may indicate early cardiovascular disease. Please note that the patient's baseline EKG displayed a RBBB limiting an evaluation of ST segment alterations during the procedure.  Echocardiogram 10/28/2021 LVEF 60 to 65%, grade 1 diastolic dysfunction.  Normal RV size and function  Assessment:  Emphysema No significant obstruction on PFTs Maintaining an exercise regimen at home but complains of increasing dyspnea with no desats on exertion and overnight Follow-up PFTs  reviewed with actually improvement in lung volumes and diffusion capacity and his CT scan does not show significant changes compared to prior and no evidence of interstitial lung disease.  Echocardiogram as noted above is unremarkable  He likes breztri better than trelegy and will continue the same.  Continue albuterol, DuoNebs Discussed pulmonary rehab but he has declined as he is trying to be active at home He is not a candidate for endobronchial valve per [facility] evaluation.  We will try to get records for our review.  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 alpha-1 antitrypsin levels have remained stable  Left lower lobe pulmonary nodule. Has remained stable on follow-up scans.  He is past the age cutoff to continue low-dose screening CT Reviewed chest x-ray  Health maintenance 03/02/2019-influenza Patient states that he is up-to-date with his pneumonia vaccine at his primary care. He is also vaccinated against Covid  Plan/Recommendations: - Continue breztri, albuterol, DuoNebs   Chilton Greathouse MD [facility] 04/12/2022, 2:45 PM  CC: Merri Brunette, MD  Addendum: Received note from a few interventional pulmonary clinic consult by Dr. Orson Slick dated 03/20/2022 They note that he has chronic right hemidiaphragm elevation causing some restrictive changes in his lungs  so he does not have hyperinflation and would not benefit from lung volume reduction.

## 2022-04-26 ENCOUNTER — Ambulatory Visit: Payer: Medicare Other | Admitting: Cardiovascular Disease

## 2022-04-26 DIAGNOSIS — L570 Actinic keratosis: Secondary | ICD-10-CM | POA: Diagnosis not present

## 2022-04-26 DIAGNOSIS — X32XXXA Exposure to sunlight, initial encounter: Secondary | ICD-10-CM | POA: Diagnosis not present

## 2022-04-26 DIAGNOSIS — L821 Other seborrheic keratosis: Secondary | ICD-10-CM | POA: Diagnosis not present

## 2022-04-27 ENCOUNTER — Telehealth: Payer: Self-pay | Admitting: Pulmonary Disease

## 2022-04-27 DIAGNOSIS — I10 Essential (primary) hypertension: Secondary | ICD-10-CM | POA: Diagnosis not present

## 2022-04-27 DIAGNOSIS — J432 Centrilobular emphysema: Secondary | ICD-10-CM | POA: Diagnosis not present

## 2022-04-27 DIAGNOSIS — J449 Chronic obstructive pulmonary disease, unspecified: Secondary | ICD-10-CM | POA: Diagnosis not present

## 2022-04-27 NOTE — Telephone Encounter (Signed)
Called and spoke with patient.  Patient requested OV notes faxed to Dr. Isaiah Serge from [facility] be faxed to Dr. Renne Crigler.  Requested notes faxed to Dr. Renne Crigler.  Nothing further at this time.

## 2022-05-01 ENCOUNTER — Encounter: Payer: Self-pay | Admitting: Pulmonary Disease

## 2022-05-08 DIAGNOSIS — H35371 Puckering of macula, right eye: Secondary | ICD-10-CM | POA: Diagnosis not present

## 2022-05-08 DIAGNOSIS — H353114 Nonexudative age-related macular degeneration, right eye, advanced atrophic with subfoveal involvement: Secondary | ICD-10-CM | POA: Diagnosis not present

## 2022-05-08 DIAGNOSIS — H43822 Vitreomacular adhesion, left eye: Secondary | ICD-10-CM | POA: Diagnosis not present

## 2022-05-08 DIAGNOSIS — H43813 Vitreous degeneration, bilateral: Secondary | ICD-10-CM | POA: Diagnosis not present

## 2022-05-09 ENCOUNTER — Inpatient Hospital Stay (HOSPITAL_COMMUNITY): Payer: Medicare Other

## 2022-05-09 ENCOUNTER — Emergency Department (HOSPITAL_COMMUNITY): Payer: Medicare Other

## 2022-05-09 ENCOUNTER — Other Ambulatory Visit: Payer: Self-pay

## 2022-05-09 ENCOUNTER — Inpatient Hospital Stay (HOSPITAL_COMMUNITY)
Admission: EM | Admit: 2022-05-09 | Discharge: 2022-05-13 | DRG: 480 | Disposition: A | Discharge status: Skilled Nursing Facility | Payer: Medicare Other | Attending: Internal Medicine | Admitting: Internal Medicine

## 2022-05-09 DIAGNOSIS — Z888 Allergy status to other drugs, medicaments and biological substances status: Secondary | ICD-10-CM | POA: Diagnosis not present

## 2022-05-09 DIAGNOSIS — S72122A Displaced fracture of lesser trochanter of left femur, initial encounter for closed fracture: Secondary | ICD-10-CM | POA: Diagnosis not present

## 2022-05-09 DIAGNOSIS — I129 Hypertensive chronic kidney disease with stage 1 through stage 4 chronic kidney disease, or unspecified chronic kidney disease: Secondary | ICD-10-CM | POA: Diagnosis not present

## 2022-05-09 DIAGNOSIS — Z6823 Body mass index (BMI) 23.0-23.9, adult: Secondary | ICD-10-CM

## 2022-05-09 DIAGNOSIS — E785 Hyperlipidemia, unspecified: Secondary | ICD-10-CM | POA: Diagnosis present

## 2022-05-09 DIAGNOSIS — F419 Anxiety disorder, unspecified: Secondary | ICD-10-CM | POA: Diagnosis present

## 2022-05-09 DIAGNOSIS — S72142D Displaced intertrochanteric fracture of left femur, subsequent encounter for closed fracture with routine healing: Secondary | ICD-10-CM | POA: Diagnosis not present

## 2022-05-09 DIAGNOSIS — S72142A Displaced intertrochanteric fracture of left femur, initial encounter for closed fracture: Secondary | ICD-10-CM | POA: Diagnosis not present

## 2022-05-09 DIAGNOSIS — Z7982 Long term (current) use of aspirin: Secondary | ICD-10-CM

## 2022-05-09 DIAGNOSIS — J449 Chronic obstructive pulmonary disease, unspecified: Secondary | ICD-10-CM | POA: Diagnosis present

## 2022-05-09 DIAGNOSIS — G2581 Restless legs syndrome: Secondary | ICD-10-CM | POA: Diagnosis present

## 2022-05-09 DIAGNOSIS — N1831 Chronic kidney disease, stage 3a: Secondary | ICD-10-CM | POA: Diagnosis present

## 2022-05-09 DIAGNOSIS — Z4789 Encounter for other orthopedic aftercare: Secondary | ICD-10-CM | POA: Diagnosis not present

## 2022-05-09 DIAGNOSIS — I10 Essential (primary) hypertension: Secondary | ICD-10-CM | POA: Diagnosis present

## 2022-05-09 DIAGNOSIS — M109 Gout, unspecified: Secondary | ICD-10-CM | POA: Diagnosis present

## 2022-05-09 DIAGNOSIS — Z79890 Hormone replacement therapy (postmenopausal): Secondary | ICD-10-CM

## 2022-05-09 DIAGNOSIS — R338 Other retention of urine: Secondary | ICD-10-CM | POA: Diagnosis present

## 2022-05-09 DIAGNOSIS — I4510 Unspecified right bundle-branch block: Secondary | ICD-10-CM | POA: Diagnosis present

## 2022-05-09 DIAGNOSIS — M1612 Unilateral primary osteoarthritis, left hip: Secondary | ICD-10-CM | POA: Diagnosis not present

## 2022-05-09 DIAGNOSIS — G4733 Obstructive sleep apnea (adult) (pediatric): Secondary | ICD-10-CM | POA: Diagnosis present

## 2022-05-09 DIAGNOSIS — N401 Enlarged prostate with lower urinary tract symptoms: Secondary | ICD-10-CM | POA: Diagnosis not present

## 2022-05-09 DIAGNOSIS — G9341 Metabolic encephalopathy: Secondary | ICD-10-CM | POA: Diagnosis not present

## 2022-05-09 DIAGNOSIS — I700 Atherosclerosis of aorta: Secondary | ICD-10-CM | POA: Diagnosis not present

## 2022-05-09 DIAGNOSIS — S72145D Nondisplaced intertrochanteric fracture of left femur, subsequent encounter for closed fracture with routine healing: Secondary | ICD-10-CM | POA: Diagnosis not present

## 2022-05-09 DIAGNOSIS — S299XXA Unspecified injury of thorax, initial encounter: Secondary | ICD-10-CM | POA: Diagnosis not present

## 2022-05-09 DIAGNOSIS — W109XXA Fall (on) (from) unspecified stairs and steps, initial encounter: Secondary | ICD-10-CM | POA: Diagnosis present

## 2022-05-09 DIAGNOSIS — I2510 Atherosclerotic heart disease of native coronary artery without angina pectoris: Secondary | ICD-10-CM | POA: Diagnosis present

## 2022-05-09 DIAGNOSIS — K219 Gastro-esophageal reflux disease without esophagitis: Secondary | ICD-10-CM | POA: Diagnosis present

## 2022-05-09 DIAGNOSIS — Z7951 Long term (current) use of inhaled steroids: Secondary | ICD-10-CM

## 2022-05-09 DIAGNOSIS — Z87891 Personal history of nicotine dependence: Secondary | ICD-10-CM

## 2022-05-09 DIAGNOSIS — E559 Vitamin D deficiency, unspecified: Secondary | ICD-10-CM | POA: Diagnosis present

## 2022-05-09 DIAGNOSIS — N189 Chronic kidney disease, unspecified: Secondary | ICD-10-CM | POA: Diagnosis not present

## 2022-05-09 DIAGNOSIS — N179 Acute kidney failure, unspecified: Secondary | ICD-10-CM | POA: Diagnosis not present

## 2022-05-09 DIAGNOSIS — R339 Retention of urine, unspecified: Secondary | ICD-10-CM | POA: Diagnosis not present

## 2022-05-09 DIAGNOSIS — E43 Unspecified severe protein-calorie malnutrition: Secondary | ICD-10-CM | POA: Diagnosis present

## 2022-05-09 DIAGNOSIS — Z7401 Bed confinement status: Secondary | ICD-10-CM | POA: Diagnosis not present

## 2022-05-09 DIAGNOSIS — J439 Emphysema, unspecified: Secondary | ICD-10-CM | POA: Diagnosis present

## 2022-05-09 DIAGNOSIS — R4182 Altered mental status, unspecified: Secondary | ICD-10-CM | POA: Diagnosis not present

## 2022-05-09 DIAGNOSIS — G8911 Acute pain due to trauma: Secondary | ICD-10-CM | POA: Diagnosis not present

## 2022-05-09 DIAGNOSIS — W19XXXA Unspecified fall, initial encounter: Secondary | ICD-10-CM | POA: Diagnosis not present

## 2022-05-09 DIAGNOSIS — D696 Thrombocytopenia, unspecified: Secondary | ICD-10-CM | POA: Diagnosis present

## 2022-05-09 DIAGNOSIS — E039 Hypothyroidism, unspecified: Secondary | ICD-10-CM | POA: Diagnosis not present

## 2022-05-09 DIAGNOSIS — G629 Polyneuropathy, unspecified: Secondary | ICD-10-CM | POA: Diagnosis present

## 2022-05-09 DIAGNOSIS — Z79899 Other long term (current) drug therapy: Secondary | ICD-10-CM | POA: Diagnosis not present

## 2022-05-09 DIAGNOSIS — H409 Unspecified glaucoma: Secondary | ICD-10-CM | POA: Diagnosis present

## 2022-05-09 DIAGNOSIS — Z8781 Personal history of (healed) traumatic fracture: Secondary | ICD-10-CM | POA: Diagnosis not present

## 2022-05-09 DIAGNOSIS — H3530 Unspecified macular degeneration: Secondary | ICD-10-CM | POA: Diagnosis present

## 2022-05-09 DIAGNOSIS — F32A Depression, unspecified: Secondary | ICD-10-CM | POA: Diagnosis present

## 2022-05-09 DIAGNOSIS — M25552 Pain in left hip: Secondary | ICD-10-CM | POA: Diagnosis not present

## 2022-05-09 DIAGNOSIS — S72002A Fracture of unspecified part of neck of left femur, initial encounter for closed fracture: Secondary | ICD-10-CM | POA: Diagnosis not present

## 2022-05-09 LAB — CBC WITH DIFFERENTIAL/PLATELET
Abs Immature Granulocytes: 0.05 10*3/uL (ref 0.00–0.07)
Basophils Absolute: 0 10*3/uL (ref 0.0–0.1)
Basophils Relative: 0 %
Eosinophils Absolute: 0.1 10*3/uL (ref 0.0–0.5)
Eosinophils Relative: 1 %
HCT: 39.5 % (ref 39.0–52.0)
Hemoglobin: 13.3 g/dL (ref 13.0–17.0)
Immature Granulocytes: 1 %
Lymphocytes Relative: 11 %
Lymphs Abs: 1 10*3/uL (ref 0.7–4.0)
MCH: 32 pg (ref 26.0–34.0)
MCHC: 33.7 g/dL (ref 30.0–36.0)
MCV: 95 fL (ref 80.0–100.0)
Monocytes Absolute: 0.6 10*3/uL (ref 0.1–1.0)
Monocytes Relative: 6 %
Neutro Abs: 7.5 10*3/uL (ref 1.7–7.7)
Neutrophils Relative %: 81 %
Platelets: 123 10*3/uL — ABNORMAL LOW (ref 150–400)
RBC: 4.16 MIL/uL — ABNORMAL LOW (ref 4.22–5.81)
RDW: 13.8 % (ref 11.5–15.5)
WBC: 9.3 10*3/uL (ref 4.0–10.5)
nRBC: 0 % (ref 0.0–0.2)

## 2022-05-09 LAB — BASIC METABOLIC PANEL
Anion gap: 7 (ref 5–15)
BUN: 20 mg/dL (ref 8–23)
CO2: 22 mmol/L (ref 22–32)
Calcium: 8.9 mg/dL (ref 8.9–10.3)
Chloride: 111 mmol/L (ref 98–111)
Creatinine, Ser: 1.41 mg/dL — ABNORMAL HIGH (ref 0.61–1.24)
GFR, Estimated: 50 mL/min — ABNORMAL LOW (ref >60)
Glucose, Bld: 128 mg/dL — ABNORMAL HIGH (ref 70–99)
Potassium: 4.3 mmol/L (ref 3.5–5.1)
Sodium: 140 mmol/L (ref 135–145)

## 2022-05-09 LAB — TYPE AND SCREEN
ABO/RH(D): O POS
Antibody Screen: NEGATIVE

## 2022-05-09 MED ORDER — TAMSULOSIN HCL 0.4 MG PO CAPS
0.4000 mg | ORAL_CAPSULE | Freq: Every day | ORAL | Status: DC
  Administered 2022-05-11 – 2022-05-13 (×3): 0.4 mg via ORAL
  Filled 2022-05-09 (×3): qty 1

## 2022-05-09 MED ORDER — ROSUVASTATIN CALCIUM 5 MG PO TABS
5.0000 mg | ORAL_TABLET | Freq: Every day | ORAL | Status: DC
  Administered 2022-05-11 – 2022-05-13 (×3): 5 mg via ORAL
  Filled 2022-05-09 (×3): qty 1

## 2022-05-09 MED ORDER — ONDANSETRON HCL 4 MG/2ML IJ SOLN
4.0000 mg | Freq: Four times a day (QID) | INTRAMUSCULAR | Status: DC | PRN
  Administered 2022-05-10: 4 mg via INTRAVENOUS
  Filled 2022-05-09: qty 2

## 2022-05-09 MED ORDER — ALBUTEROL SULFATE HFA 108 (90 BASE) MCG/ACT IN AERS: 2.0000 | INHALATION_SPRAY | Freq: Four times a day (QID) | RESPIRATORY_TRACT | Status: DC | PRN

## 2022-05-09 MED ORDER — SERTRALINE HCL 100 MG PO TABS
100.0000 mg | ORAL_TABLET | Freq: Every day | ORAL | Status: DC
  Administered 2022-05-11 – 2022-05-13 (×3): 100 mg via ORAL
  Filled 2022-05-09 (×3): qty 1

## 2022-05-09 MED ORDER — ROPINIROLE HCL 1 MG PO TABS
1.0000 mg | ORAL_TABLET | Freq: Three times a day (TID) | ORAL | Status: DC
  Administered 2022-05-09 – 2022-05-13 (×11): 1 mg via ORAL
  Filled 2022-05-09 (×11): qty 1

## 2022-05-09 MED ORDER — BUPROPION HCL ER (XL) 150 MG PO TB24
150.0000 mg | ORAL_TABLET | Freq: Every morning | ORAL | Status: DC
  Administered 2022-05-11 – 2022-05-13 (×3): 150 mg via ORAL
  Filled 2022-05-09 (×3): qty 1

## 2022-05-09 MED ORDER — IPRATROPIUM-ALBUTEROL 0.5-2.5 (3) MG/3ML IN SOLN: 3.0000 mL | RESPIRATORY_TRACT | Status: DC | PRN

## 2022-05-09 MED ORDER — LACTATED RINGERS IV SOLN: INTRAVENOUS | Status: DC

## 2022-05-09 MED ORDER — HYDROCODONE-ACETAMINOPHEN 5-325 MG PO TABS
1.0000 | ORAL_TABLET | Freq: Four times a day (QID) | ORAL | Status: DC | PRN
  Administered 2022-05-09: 2 via ORAL
  Filled 2022-05-09: qty 2

## 2022-05-09 MED ORDER — SENNOSIDES-DOCUSATE SODIUM 8.6-50 MG PO TABS
1.0000 | ORAL_TABLET | Freq: Every evening | ORAL | Status: DC | PRN
  Administered 2022-05-11: 1 via ORAL
  Filled 2022-05-09: qty 1

## 2022-05-09 MED ORDER — BUDESON-GLYCOPYRROL-FORMOTEROL 160-9-4.8 MCG/ACT IN AERO: 2.0000 | INHALATION_SPRAY | Freq: Two times a day (BID) | RESPIRATORY_TRACT | Status: DC

## 2022-05-09 MED ORDER — HYDROMORPHONE HCL 1 MG/ML IJ SOLN
0.5000 mg | Freq: Once | INTRAMUSCULAR | Status: AC
  Administered 2022-05-09: 0.5 mg via INTRAVENOUS
  Filled 2022-05-09: qty 1

## 2022-05-09 MED ORDER — MOMETASONE FURO-FORMOTEROL FUM 100-5 MCG/ACT IN AERO
2.0000 | INHALATION_SPRAY | Freq: Two times a day (BID) | RESPIRATORY_TRACT | Status: DC
  Administered 2022-05-10 – 2022-05-13 (×8): 2 via RESPIRATORY_TRACT
  Filled 2022-05-09: qty 8.8

## 2022-05-09 MED ORDER — LEVOTHYROXINE SODIUM 112 MCG PO TABS
112.0000 ug | ORAL_TABLET | Freq: Every day | ORAL | Status: DC
  Administered 2022-05-11 – 2022-05-13 (×3): 112 ug via ORAL
  Filled 2022-05-09 (×3): qty 1

## 2022-05-09 MED ORDER — ALBUTEROL SULFATE (2.5 MG/3ML) 0.083% IN NEBU
2.5000 mg | INHALATION_SOLUTION | Freq: Four times a day (QID) | RESPIRATORY_TRACT | Status: DC | PRN
  Filled 2022-05-09: qty 3

## 2022-05-09 MED ORDER — LOSARTAN POTASSIUM 50 MG PO TABS: 50.0000 mg | ORAL_TABLET | Freq: Every day | ORAL | Status: DC

## 2022-05-09 MED ORDER — AMLODIPINE BESYLATE 10 MG PO TABS
10.0000 mg | ORAL_TABLET | Freq: Every day | ORAL | Status: DC
  Administered 2022-05-11 – 2022-05-13 (×3): 10 mg via ORAL
  Filled 2022-05-09 (×3): qty 1

## 2022-05-09 MED ORDER — HYDROMORPHONE HCL 1 MG/ML IJ SOLN
0.5000 mg | INTRAMUSCULAR | Status: DC | PRN
  Administered 2022-05-10 – 2022-05-11 (×9): 0.5 mg via INTRAVENOUS
  Filled 2022-05-09 (×9): qty 0.5

## 2022-05-09 MED ORDER — UMECLIDINIUM BROMIDE 62.5 MCG/ACT IN AEPB
1.0000 | INHALATION_SPRAY | Freq: Every day | RESPIRATORY_TRACT | Status: DC
  Administered 2022-05-10 – 2022-05-13 (×4): 1 via RESPIRATORY_TRACT
  Filled 2022-05-09: qty 7

## 2022-05-09 MED ORDER — LATANOPROST 0.005 % OP SOLN
1.0000 [drp] | Freq: Every day | OPHTHALMIC | Status: DC
  Administered 2022-05-11 – 2022-05-12 (×2): 1 [drp] via OPHTHALMIC
  Filled 2022-05-09 (×2): qty 2.5

## 2022-05-09 MED ORDER — ACETAMINOPHEN 325 MG PO TABS
650.0000 mg | ORAL_TABLET | Freq: Four times a day (QID) | ORAL | Status: DC | PRN
  Administered 2022-05-11 – 2022-05-12 (×4): 650 mg via ORAL
  Filled 2022-05-09 (×5): qty 2

## 2022-05-09 NOTE — ED Provider Notes (Signed)
[facility]-EMERGENCY DEPT Provider Note   CSN: [id number] Arrival date & time: 05/09/22  1912     History  Chief Complaint  Patient presents with   Timothy Spencer is a 81 y.o. male.  81 year old male presents after mechanical fall prior to arrival.  States that he missed a step and fell onto his left hip.  Denies any head or neck trauma from this.  Only complains of sharp pain to his left hip that is worse with any movement.  Denies any numbness or tingling to his left foot.  Patient is not on blood thinners.  EMS was called and patient given fentanyl and placed into a c-collar and transported here       Home Medications Prior to Admission medications   Medication Sig Start Date End Date Taking? Authorizing Provider  albuterol (VENTOLIN HFA) 108 (90 Base) MCG/ACT inhaler Inhale 2 puffs into the lungs every 6 (six) hours as needed for wheezing or shortness of breath. 02/08/22   Mannam, Colbert Coyer, MD  amLODipine (NORVASC) 10 MG tablet Take 10 mg by mouth daily. 10/10/21   [provider]  aspirin EC 81 MG tablet Take 81 mg by mouth daily.    [provider]  Budeson-Glycopyrrol-Formoterol (BREZTRI AEROSPHERE) 160-9-4.8 MCG/ACT AERO Inhale 2 puffs into the lungs in the morning and at bedtime. 11/22/21   Mannam, Colbert Coyer, MD  buPROPion (WELLBUTRIN XL) 150 MG 24 hr tablet Take 150 mg by mouth every morning. 07/17/21   [provider]  ipratropium-albuterol (DUONEB) 0.5-2.5 (3) MG/3ML SOLN Take 3 mLs by nebulization every 4 (four) hours as needed. 01/18/22   Mannam, Colbert Coyer, MD  isosorbide mononitrate (IMDUR) 60 MG 24 hr tablet TAKE 1 AND 1/2 TABLETS(90 MG) BY MOUTH DAILY 05/13/20   Lennette Bihari, MD  latanoprost (XALATAN) 0.005 % ophthalmic solution 1 drop at bedtime. 08/17/21   [provider]  levothyroxine (SYNTHROID) 112 MCG tablet Take 112 mcg by mouth daily. 07/29/19   [provider]  rOPINIRole (REQUIP) 1 MG  tablet Take 1 mg by mouth 2 (two) times daily.  01/01/13   [provider]  rosuvastatin (CRESTOR) 5 MG tablet Take 5 mg by mouth every other day. 07/15/19   [provider]  sertraline (ZOLOFT) 100 MG tablet Take 100 mg by mouth daily. 01/25/13   [provider]  tamsulosin (FLOMAX) 0.4 MG CAPS capsule Take 0.4 mg by mouth daily.  01/31/13   [provider]      Allergies    Patient has no known allergies.    Review of Systems   Review of Systems  All other systems reviewed and are negative.   Physical Exam Updated Vital Signs BP (!) 168/76   Pulse 87   Temp 97.9 F (36.6 C) (Oral)   Resp (!) 21   SpO2 97%  Physical Exam Vitals and nursing note reviewed.  Constitutional:      General: He is not in acute distress.    Appearance: Normal appearance. He is well-developed. He is not toxic-appearing.  HENT:     Head: Normocephalic and atraumatic.  Eyes:     General: Lids are normal.     Conjunctiva/sclera: Conjunctivae normal.     Pupils: Pupils are equal, round, and reactive to light.  Neck:     Thyroid: No thyroid mass.     Trachea: No tracheal deviation.  Cardiovascular:     Rate and Rhythm: Normal rate and regular rhythm.  Heart sounds: Normal heart sounds. No murmur heard.    No gallop.  Pulmonary:     Effort: Pulmonary effort is normal. No respiratory distress.     Breath sounds: Normal breath sounds. No stridor. No decreased breath sounds, wheezing, rhonchi or rales.  Abdominal:     General: There is no distension.     Palpations: Abdomen is soft.     Tenderness: There is no abdominal tenderness. There is no rebound.  Musculoskeletal:     Cervical back: Normal range of motion and neck supple.     Left hip: Tenderness and bony tenderness present. Decreased range of motion.       Legs:     Comments: Neurovascular intact at left foot  Skin:    General: Skin is warm and dry.     Findings: No abrasion or rash.  Neurological:      Mental Status: He is alert and oriented to person, place, and time. Mental status is at baseline.     GCS: GCS eye subscore is 4. GCS verbal subscore is 5. GCS motor subscore is 6.     Cranial Nerves: No cranial nerve deficit.     Sensory: No sensory deficit.     Motor: Motor function is intact.  Psychiatric:        Attention and Perception: Attention normal.        Speech: Speech normal.        Behavior: Behavior normal.     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  Procedures Procedures    Medications Ordered in ED Medications  lactated ringers infusion (has no administration in time range)  HYDROmorphone (DILAUDID) injection 0.5 mg (has no administration in time range)    ED Course/ Medical Decision Making/ A&P                           Medical Decision Making Amount and/or Complexity of Data Reviewed Labs: ordered. Radiology: ordered. ECG/medicine tests: ordered.  Risk Prescription drug management.  Patient is EKG per interpretation is unchanged from prior studies.  He is in sinus rhythm. Patient medicated for pain here.  Patient's x-ray of his left hip shows a mildly impacted left intertrochanteric fracture.  Chest x-ray per interpretation negative.  Will consult orthopedics due to patient's left hip fracture and admit to the hospitalist service        Final Clinical Impression(s) / ED Diagnoses Final  diagnoses:  None    Rx / DC Orders ED Discharge Orders     None         Lacretia Leigh, MD 05/09/22 2028

## 2022-05-09 NOTE — Assessment & Plan Note (Signed)
Occurring after mechanical fall. Based on RCRI patient has a 6.0% 30-day perioperative risk of death, MI, or cardiac arrest (hx of CAD). -Orthopedics following, possible surgical fixation 12/13 -Keep n.p.o. after midnight -Continue analgesics as needed

## 2022-05-09 NOTE — Progress Notes (Addendum)
Discussed case with ED provider. Patient with left basicervical intertroch femur fracture after ground level level. Plan for ORIF with cephallomedullary nail tomorrow  pending medical clearance and OR availability. Please have patient NPO after midnight for OR 12/13. Full consult note to follow.

## 2022-05-09 NOTE — Assessment & Plan Note (Signed)
Stable, denies any chest pain.  Does not appear to be on antiplatelet.  Continue rosuvastatin.

## 2022-05-09 NOTE — ED Triage Notes (Signed)
Pt arrives from home via [place], lives with his wife. On the bottom step landed on left hip. Left hip pain, shortening and external rotation. No loc, no thinners. IV established in the left forearm, 150 Fentanyl given en route. C collar placed. Vitals 140/90, hr 80, nsr, 97%, rr 20. A/O.

## 2022-05-09 NOTE — Assessment & Plan Note (Signed)
Continue bupropion and sertraline 

## 2022-05-09 NOTE — H&P (Signed)
History and Physical    Timothy Spencer WCH:[id number] DOB: 03/07/1941 DOA: 05/09/2022  PCP: Timothy Brunette, MD  Patient coming from: Home  I have personally briefly reviewed patient's old medical records in [facility]  Chief Complaint: Left hip pain after fall  HPI: Timothy Spencer is a 81 y.o. male with medical history significant for COPD, CAD, CKD stage IIIa, RBBB, HTN, HLD, hypothyroidism, RLS, BPH who presented to the ED for evaluation of left hip pain after a fall.  Patient states he was walking downstairs earlier today when he missed a step and fell onto his left hip.  He had severe pain and was unable to stand up on his own.  He did not hit his head or lose consciousness.  Denies any recent chest pain, dyspnea, nausea, vomiting or abdominal pain. Does not take any blood thinners.  ED Course  Labs/Imaging on admission: I have personally reviewed following labs and imaging studies.  Initial vitals showed BP 168/76, pulse 86, RR 21, temp 97.9 F, SpO2 97% on room air.  Labs show WBC 9.3, hemoglobin 13.3, platelets 123,000, sodium 140, potassium 4.3, bicarb 22, BUN 20, creatinine 1.41, serum glucose 128.  Left hip x-ray shows a mildly impacted left intertrochanteric fracture.  Portable chest x-ray shows chronically elevated right hemidiaphragm.  No focal consolidation, edema, effusion.  Orthopedics, Dr. Blanchie Dessert, were consulted and recommended medical admission with plan for potential surgical fixation on 12/13 pending or availability.  The hospitalist service was consulted to admit for further evaluation and management.  Review of Systems: All systems reviewed and are negative except as documented in history of present illness above.   Past Medical History:  Diagnosis Date   Anxiety and depression    BPH (benign prostatic hyperplasia)    CAD (coronary artery disease)    COPD (chronic obstructive pulmonary disease) (HCC)    DOE (dyspnea on exertion)    ED  (erectile dysfunction)    Emphysema of lung (HCC)    First degree AV block    GERD (gastroesophageal reflux disease)    Glaucoma    Gout    Gynecomastia    H/O ETOH abuse    Hemochromatosis    Hyperlipidemia    Hypertension    Hypothyroidism    Macular degeneration    OSA (obstructive sleep apnea)    Osteopenia    Peripheral neuropathy    RLS (restless legs syndrome)    Thrombocytopenia (HCC)     Past Surgical History:  Procedure Laterality Date   CARDIAC CATHETERIZATION  03/27/2008   Medical therapy   CARDIAC CATHETERIZATION  06/03/2010   Medical theray and smoking cessation   CARDIAC CATHETERIZATION N/A 11/24/2014   Procedure: Left Heart Cath and Coronary Angiography;  Surgeon: Timothy Bihari, MD;  Location: [facility] INVASIVE CV LAB;  Service: Cardiovascular;  Laterality: N/A;   CARDIOPULMONARY EXERCISE TEST  02/12/2012   Peak VO2 63% of predicted   ORIF FEMUR FRACTURE Right    TRANSTHORACIC ECHOCARDIOGRAM  11/28/2011   EF >55%, normal    Social History:  reports that he quit smoking about 11 years ago. His smoking use included cigarettes. He started smoking about 62 years ago. He has a 100.00 pack-year smoking history. He has never used smokeless tobacco. He reports that he does not drink alcohol and does not use drugs.  Allergies  Allergen Reactions   Hydrochlorothiazide W-Spironolactone     Other Reaction(s): dizziness, fainting    Family History  Adopted: Yes  Prior to Admission medications   Medication Sig Start Date End Date Taking? Authorizing Provider  albuterol (VENTOLIN HFA) 108 (90 Base) MCG/ACT inhaler Inhale 2 puffs into the lungs every 6 (six) hours as needed for wheezing or shortness of breath. 02/08/22   Mannam, Hart Robinsons, MD  amLODipine (NORVASC) 10 MG tablet Take 10 mg by mouth daily. 10/10/21   [provider]  aspirin EC 81 MG tablet Take 81 mg by mouth daily.    [provider]  Budeson-Glycopyrrol-Formoterol (BREZTRI AEROSPHERE)  160-9-4.8 MCG/ACT AERO Inhale 2 puffs into the lungs in the morning and at bedtime. 11/22/21   Mannam, Hart Robinsons, MD  buPROPion (WELLBUTRIN XL) 150 MG 24 hr tablet Take 150 mg by mouth every morning. 07/17/21   [provider]  ipratropium-albuterol (DUONEB) 0.5-2.5 (3) MG/3ML SOLN Take 3 mLs by nebulization every 4 (four) hours as needed. 01/18/22   Mannam, Hart Robinsons, MD  isosorbide mononitrate (IMDUR) 60 MG 24 hr tablet TAKE 1 AND 1/2 TABLETS(90 MG) BY MOUTH DAILY 05/13/20   Troy Sine, MD  latanoprost (XALATAN) 0.005 % ophthalmic solution 1 drop at bedtime. 08/17/21   [provider]  levothyroxine (SYNTHROID) 112 MCG tablet Take 112 mcg by mouth daily. 07/29/19   [provider]  rOPINIRole (REQUIP) 1 MG tablet Take 1 mg by mouth 2 (two) times daily.  01/01/13   [provider]  rosuvastatin (CRESTOR) 5 MG tablet Take 5 mg by mouth every other day. 07/15/19   [provider]  sertraline (ZOLOFT) 100 MG tablet Take 100 mg by mouth daily. 01/25/13   [provider]  tamsulosin (FLOMAX) 0.4 MG CAPS capsule Take 0.4 mg by mouth daily.  01/31/13   [provider]    Physical Exam: Vitals:   05/09/22 2030 05/09/22 2100 05/09/22 2115 05/09/22 2120  BP: 123/74   (!) 144/69  Pulse: 91 97 (!) 101 99  Resp: (!) 24 17 19 20   Temp:    98.4 F (36.9 C)  TempSrc:      SpO2: 97% 94% 94% 95%   Constitutional: Resting supine in bed, NAD, calm, comfortable Eyes: EOMI, lids and conjunctivae normal ENMT: Mucous membranes are moist. Posterior pharynx clear of any exudate or lesions.Normal dentition.  Neck: normal, supple, no masses. Respiratory: clear to auscultation anteriorly. Normal respiratory effort. No accessory muscle use.  Cardiovascular: Regular rate and rhythm, no murmurs / rubs / gallops. No extremity edema. 2+ pedal pulses. Abdomen: no tenderness, no masses palpated.  Musculoskeletal: ROM left hip diminished due to fracture.  No clubbing /  cyanosis. No contractures. Normal muscle tone.  Skin: no rashes, lesions, ulcers. No induration Neurologic: Sensation intact. Strength diminished LLE due to hip fracture otherwise 5/5 in all other extremities. Psychiatric: Alert and oriented x 3. Normal mood.   EKG: Personally reviewed. Sinus rhythm, rate 99, first-degree AV block, RBBB and LAFB.  Artifact throughout.  Similar to prior.  Assessment/Plan Principal Problem:   Closed intertrochanteric fracture of left femur, initial encounter ([place]) Active Problems:   CAD (coronary artery disease)   Essential hypertension   COPD (chronic obstructive pulmonary disease) (HCC)   Chronic kidney disease, stage 3a (HCC)   Hypothyroid   Hyperlipidemia with target LDL less than 70   Anxiety and depression   Thrombocytopenia ([place])   Timothy Spencer is a 81 y.o. male with medical history significant for COPD, CAD, CKD stage IIIa, RBBB, HTN, HLD, hypothyroidism, RLS, BPH who is admitted with an acute left intratrochanteric fracture after  a mechanical fall.  Assessment and Plan: * Closed intertrochanteric fracture of left femur, initial encounter ([place]) Occurring after mechanical fall. Based on RCRI patient has a 6.0% 30-day perioperative risk of death, MI, or cardiac arrest (hx of CAD). -Orthopedics following, possible surgical fixation 12/13 -Keep n.p.o. after midnight -Continue analgesics as needed  CAD (coronary artery disease) Stable, denies any chest pain.  Does not appear to be on antiplatelet.  Continue rosuvastatin.  COPD (chronic obstructive pulmonary disease) (HCC) Stable, no wheezing on admission.   -Continue Breztri BID -DuoNeb and albuterol as needed  Essential hypertension Continue amlodipine and losartan.  Chronic kidney disease, stage 3a ([place]) Renal function stable.  Continue to monitor.  Hypothyroid Continue Synthroid.  Thrombocytopenia (HCC) Mild without obvious bleeding.  Continue to monitor.  Anxiety and  depression Continue bupropion and sertraline.  Hyperlipidemia with target LDL less than 70 Continue rosuvastatin.  DVT prophylaxis: SCDs Start: 05/09/22 2136 Code Status: Full code, confirmed with patient on admission Family Communication: Spouse at bedside Disposition Plan: From home, dispo pending clinical progress Consults called: Orthopedics Severity of Illness: The appropriate patient status for this patient is INPATIENT. Inpatient status is judged to be reasonable and necessary in order to provide the required intensity of service to ensure the patient's safety. The patient's presenting symptoms, physical exam findings, and initial radiographic and laboratory data in the context of their chronic comorbidities is felt to place them at high risk for further clinical deterioration. Furthermore, it is not anticipated that the patient will be medically stable for discharge from the hospital within 2 midnights of admission.   * I certify that at the point of admission it is my clinical judgment that the patient will require inpatient hospital care spanning beyond 2 midnights from the point of admission due to high intensity of service, high risk for further deterioration and high frequency of surveillance required.Zada Finders MD [facility]  If 7PM-7AM, please contact night-coverage [email or web address]  05/09/2022, 10:00 PM

## 2022-05-09 NOTE — Assessment & Plan Note (Signed)
Continue amlodipine and losartan 

## 2022-05-09 NOTE — Hospital Course (Signed)
Timothy Spencer is a 81 y.o. male with medical history significant for COPD, CAD, CKD stage IIIa, RBBB, HTN, HLD, hypothyroidism, RLS, BPH who is admitted with an acute left intratrochanteric fracture after a mechanical fall.

## 2022-05-09 NOTE — Assessment & Plan Note (Signed)
Stable, no wheezing on admission.   -Continue Breztri BID -DuoNeb and albuterol as needed

## 2022-05-09 NOTE — Assessment & Plan Note (Signed)
Renal function stable.  Continue to monitor. 

## 2022-05-09 NOTE — ED Notes (Signed)
admit Provider at bedside, will transport after completion of assessment

## 2022-05-09 NOTE — Assessment & Plan Note (Signed)
Continue rosuvastatin.  

## 2022-05-09 NOTE — Assessment & Plan Note (Signed)
Continue Synthroid °

## 2022-05-09 NOTE — Assessment & Plan Note (Signed)
Mild without obvious bleeding.  Continue to monitor. 

## 2022-05-10 ENCOUNTER — Other Ambulatory Visit: Payer: Self-pay

## 2022-05-10 ENCOUNTER — Inpatient Hospital Stay (HOSPITAL_COMMUNITY): Payer: Medicare Other | Admitting: Anesthesiology

## 2022-05-10 ENCOUNTER — Inpatient Hospital Stay (HOSPITAL_COMMUNITY): Payer: Medicare Other

## 2022-05-10 ENCOUNTER — Encounter (HOSPITAL_COMMUNITY)
Admission: EM | Disposition: A | Discharge status: Skilled Nursing Facility | Payer: Self-pay | Source: Home / Self Care | Attending: Internal Medicine

## 2022-05-10 ENCOUNTER — Encounter (HOSPITAL_COMMUNITY): Payer: Self-pay | Admitting: Internal Medicine

## 2022-05-10 DIAGNOSIS — J449 Chronic obstructive pulmonary disease, unspecified: Secondary | ICD-10-CM

## 2022-05-10 DIAGNOSIS — I10 Essential (primary) hypertension: Secondary | ICD-10-CM

## 2022-05-10 DIAGNOSIS — S72142A Displaced intertrochanteric fracture of left femur, initial encounter for closed fracture: Secondary | ICD-10-CM

## 2022-05-10 DIAGNOSIS — Z87891 Personal history of nicotine dependence: Secondary | ICD-10-CM

## 2022-05-10 DIAGNOSIS — I2510 Atherosclerotic heart disease of native coronary artery without angina pectoris: Secondary | ICD-10-CM

## 2022-05-10 DIAGNOSIS — E43 Unspecified severe protein-calorie malnutrition: Secondary | ICD-10-CM | POA: Insufficient documentation

## 2022-05-10 HISTORY — PX: FEMUR IM NAIL: SHX1597

## 2022-05-10 LAB — ABO/RH: ABO/RH(D): O POS

## 2022-05-10 LAB — CBC
HCT: 37.8 % — ABNORMAL LOW (ref 39.0–52.0)
Hemoglobin: 12.8 g/dL — ABNORMAL LOW (ref 13.0–17.0)
MCH: 32.2 pg (ref 26.0–34.0)
MCHC: 33.9 g/dL (ref 30.0–36.0)
MCV: 95.2 fL (ref 80.0–100.0)
Platelets: 129 10*3/uL — ABNORMAL LOW (ref 150–400)
RBC: 3.97 MIL/uL — ABNORMAL LOW (ref 4.22–5.81)
RDW: 14.1 % (ref 11.5–15.5)
WBC: 10.4 10*3/uL (ref 4.0–10.5)
nRBC: 0 % (ref 0.0–0.2)

## 2022-05-10 LAB — BASIC METABOLIC PANEL
Anion gap: 9 (ref 5–15)
BUN: 19 mg/dL (ref 8–23)
CO2: 21 mmol/L — ABNORMAL LOW (ref 22–32)
Calcium: 9.1 mg/dL (ref 8.9–10.3)
Chloride: 110 mmol/L (ref 98–111)
Creatinine, Ser: 1.6 mg/dL — ABNORMAL HIGH (ref 0.61–1.24)
GFR, Estimated: 43 mL/min — ABNORMAL LOW (ref >60)
Glucose, Bld: 124 mg/dL — ABNORMAL HIGH (ref 70–99)
Potassium: 4.5 mmol/L (ref 3.5–5.1)
Sodium: 140 mmol/L (ref 135–145)

## 2022-05-10 LAB — VITAMIN D 25 HYDROXY (VIT D DEFICIENCY, FRACTURES): Vit D, 25-Hydroxy: 9.8 ng/mL — ABNORMAL LOW (ref 30–100)

## 2022-05-10 LAB — SURGICAL PCR SCREEN
MRSA, PCR: NEGATIVE
Staphylococcus aureus: NEGATIVE

## 2022-05-10 SURGERY — INSERTION, INTRAMEDULLARY ROD, FEMUR
Anesthesia: General | Site: Hip | Laterality: Left

## 2022-05-10 MED ORDER — VITAMIN D (ERGOCALCIFEROL) 1.25 MG (50000 UNIT) PO CAPS
50000.0000 [IU] | ORAL_CAPSULE | ORAL | Status: DC
  Filled 2022-05-10: qty 1

## 2022-05-10 MED ORDER — ONDANSETRON HCL 4 MG/2ML IJ SOLN
INTRAMUSCULAR | Status: DC | PRN
  Administered 2022-05-10: 4 mg via INTRAVENOUS

## 2022-05-10 MED ORDER — PHENYLEPHRINE HCL-NACL 20-0.9 MG/250ML-% IV SOLN
INTRAVENOUS | Status: DC | PRN
  Administered 2022-05-10: 50 ug/min via INTRAVENOUS

## 2022-05-10 MED ORDER — VANCOMYCIN HCL 1000 MG IV SOLR
INTRAVENOUS | Status: DC | PRN
  Administered 2022-05-10: 1000 mg via TOPICAL

## 2022-05-10 MED ORDER — FENTANYL CITRATE PF 50 MCG/ML IJ SOSY
PREFILLED_SYRINGE | INTRAMUSCULAR | Status: AC
  Filled 2022-05-10: qty 1

## 2022-05-10 MED ORDER — 0.9 % SODIUM CHLORIDE (POUR BTL) OPTIME
TOPICAL | Status: DC | PRN
  Administered 2022-05-10: 1000 mL

## 2022-05-10 MED ORDER — METHOCARBAMOL 500 MG PO TABS
500.0000 mg | ORAL_TABLET | Freq: Once | ORAL | Status: AC
  Administered 2022-05-10: 500 mg via ORAL
  Filled 2022-05-10: qty 1

## 2022-05-10 MED ORDER — DEXAMETHASONE SODIUM PHOSPHATE 10 MG/ML IJ SOLN
INTRAMUSCULAR | Status: AC
  Filled 2022-05-10: qty 1

## 2022-05-10 MED ORDER — ONDANSETRON HCL 4 MG/2ML IJ SOLN
INTRAMUSCULAR | Status: AC
  Filled 2022-05-10: qty 2

## 2022-05-10 MED ORDER — PHENYLEPHRINE 80 MCG/ML (10ML) SYRINGE FOR IV PUSH (FOR BLOOD PRESSURE SUPPORT)
PREFILLED_SYRINGE | INTRAVENOUS | Status: DC | PRN
  Administered 2022-05-10 (×8): 160 ug via INTRAVENOUS

## 2022-05-10 MED ORDER — CHLORHEXIDINE GLUCONATE 4 % EX LIQD: 60.0000 mL | Freq: Once | CUTANEOUS | Status: DC

## 2022-05-10 MED ORDER — ACETAMINOPHEN 10 MG/ML IV SOLN
INTRAVENOUS | Status: DC | PRN
  Administered 2022-05-10: 1000 mg via INTRAVENOUS

## 2022-05-10 MED ORDER — FENTANYL CITRATE PF 50 MCG/ML IJ SOSY
50.0000 ug | PREFILLED_SYRINGE | Freq: Once | INTRAMUSCULAR | Status: AC
  Administered 2022-05-10: 50 ug via INTRAVENOUS
  Filled 2022-05-10: qty 2

## 2022-05-10 MED ORDER — EPHEDRINE 5 MG/ML INJ
INTRAVENOUS | Status: AC
  Filled 2022-05-10: qty 5

## 2022-05-10 MED ORDER — FENTANYL CITRATE PF 50 MCG/ML IJ SOSY
50.0000 ug | PREFILLED_SYRINGE | Freq: Once | INTRAMUSCULAR | Status: AC
  Administered 2022-05-10: 50 ug via INTRAVENOUS

## 2022-05-10 MED ORDER — PHENYLEPHRINE 80 MCG/ML (10ML) SYRINGE FOR IV PUSH (FOR BLOOD PRESSURE SUPPORT)
PREFILLED_SYRINGE | INTRAVENOUS | Status: AC
  Filled 2022-05-10: qty 20

## 2022-05-10 MED ORDER — ENOXAPARIN SODIUM 40 MG/0.4ML IJ SOSY
40.0000 mg | PREFILLED_SYRINGE | INTRAMUSCULAR | Status: DC
  Administered 2022-05-11: 40 mg via SUBCUTANEOUS
  Filled 2022-05-10: qty 0.4

## 2022-05-10 MED ORDER — ONDANSETRON HCL 4 MG/2ML IJ SOLN: 4.0000 mg | Freq: Once | INTRAMUSCULAR | Status: DC | PRN

## 2022-05-10 MED ORDER — VITAMIN D 25 MCG (1000 UNIT) PO TABS
1000.0000 [IU] | ORAL_TABLET | Freq: Every day | ORAL | Status: DC
  Administered 2022-05-11 – 2022-05-13 (×3): 1000 [IU] via ORAL
  Filled 2022-05-10 (×3): qty 1

## 2022-05-10 MED ORDER — DEXAMETHASONE SODIUM PHOSPHATE 10 MG/ML IJ SOLN
INTRAMUSCULAR | Status: DC | PRN
  Administered 2022-05-10: 10 mg via INTRAVENOUS

## 2022-05-10 MED ORDER — ROPIVACAINE HCL 5 MG/ML IJ SOLN
INTRAMUSCULAR | Status: DC | PRN
  Administered 2022-05-10: 30 mL via PERINEURAL

## 2022-05-10 MED ORDER — AMISULPRIDE (ANTIEMETIC) 5 MG/2ML IV SOLN
INTRAVENOUS | Status: AC
  Filled 2022-05-10: qty 4

## 2022-05-10 MED ORDER — CEFAZOLIN SODIUM-DEXTROSE 2-4 GM/100ML-% IV SOLN
2.0000 g | INTRAVENOUS | Status: AC
  Administered 2022-05-10: 2 g via INTRAVENOUS
  Filled 2022-05-10: qty 100

## 2022-05-10 MED ORDER — AMISULPRIDE (ANTIEMETIC) 5 MG/2ML IV SOLN
10.0000 mg | Freq: Once | INTRAVENOUS | Status: AC | PRN
  Administered 2022-05-10: 10 mg via INTRAVENOUS

## 2022-05-10 MED ORDER — STERILE WATER FOR IRRIGATION IR SOLN
Status: DC | PRN
  Administered 2022-05-10: 2000 mL

## 2022-05-10 MED ORDER — LACTATED RINGERS IV SOLN: INTRAVENOUS | Status: DC | PRN

## 2022-05-10 MED ORDER — ACETAMINOPHEN 10 MG/ML IV SOLN: 1000.0000 mg | Freq: Once | INTRAVENOUS | Status: DC | PRN

## 2022-05-10 MED ORDER — METHOCARBAMOL 1000 MG/10ML IJ SOLN
500.0000 mg | Freq: Three times a day (TID) | INTRAVENOUS | Status: DC | PRN
  Administered 2022-05-11: 500 mg via INTRAVENOUS
  Filled 2022-05-10: qty 500
  Filled 2022-05-10: qty 5

## 2022-05-10 MED ORDER — LIDOCAINE HCL (CARDIAC) PF 100 MG/5ML IV SOSY
PREFILLED_SYRINGE | INTRAVENOUS | Status: DC | PRN
  Administered 2022-05-10: 60 mg via INTRATRACHEAL

## 2022-05-10 MED ORDER — MELATONIN 5 MG PO TABS
5.0000 mg | ORAL_TABLET | Freq: Once | ORAL | Status: AC
  Administered 2022-05-10: 5 mg via ORAL
  Filled 2022-05-10: qty 1

## 2022-05-10 MED ORDER — ATROPINE SULFATE 1 MG/10ML IJ SOSY
PREFILLED_SYRINGE | INTRAMUSCULAR | Status: AC
  Filled 2022-05-10: qty 10

## 2022-05-10 MED ORDER — POVIDONE-IODINE 10 % EX SWAB: 2.0000 | Freq: Once | CUTANEOUS | Status: DC

## 2022-05-10 MED ORDER — PHENYLEPHRINE HCL (PRESSORS) 10 MG/ML IV SOLN
INTRAVENOUS | Status: AC
  Filled 2022-05-10: qty 1

## 2022-05-10 MED ORDER — METHOCARBAMOL 500 MG IVPB - SIMPLE MED
INTRAVENOUS | Status: AC
  Administered 2022-05-10: 500 mg
  Filled 2022-05-10: qty 55

## 2022-05-10 MED ORDER — MELATONIN 5 MG PO TABS
5.0000 mg | ORAL_TABLET | Freq: Every evening | ORAL | Status: DC | PRN
  Administered 2022-05-12: 5 mg via ORAL
  Filled 2022-05-10 (×2): qty 1

## 2022-05-10 MED ORDER — OXYCODONE HCL 5 MG PO TABS
2.5000 mg | ORAL_TABLET | ORAL | Status: DC | PRN
  Administered 2022-05-10 – 2022-05-13 (×8): 5 mg via ORAL
  Filled 2022-05-10 (×9): qty 1

## 2022-05-10 MED ORDER — SUGAMMADEX SODIUM 200 MG/2ML IV SOLN
INTRAVENOUS | Status: DC | PRN
  Administered 2022-05-10: 165 mg via INTRAVENOUS

## 2022-05-10 MED ORDER — LIDOCAINE HCL (PF) 2 % IJ SOLN
INTRAMUSCULAR | Status: AC
  Filled 2022-05-10: qty 5

## 2022-05-10 MED ORDER — VANCOMYCIN HCL 1000 MG IV SOLR
INTRAVENOUS | Status: AC
  Filled 2022-05-10: qty 20

## 2022-05-10 MED ORDER — FENTANYL CITRATE PF 50 MCG/ML IJ SOSY
25.0000 ug | PREFILLED_SYRINGE | INTRAMUSCULAR | Status: DC | PRN
  Administered 2022-05-10 (×3): 50 ug via INTRAVENOUS

## 2022-05-10 MED ORDER — ROCURONIUM BROMIDE 10 MG/ML (PF) SYRINGE
PREFILLED_SYRINGE | INTRAVENOUS | Status: DC | PRN
  Administered 2022-05-10: 40 mg via INTRAVENOUS

## 2022-05-10 MED ORDER — ACETAMINOPHEN 10 MG/ML IV SOLN
INTRAVENOUS | Status: AC
  Filled 2022-05-10: qty 100

## 2022-05-10 MED ORDER — FENTANYL CITRATE PF 50 MCG/ML IJ SOSY
PREFILLED_SYRINGE | INTRAMUSCULAR | Status: AC
  Filled 2022-05-10: qty 2

## 2022-05-10 MED ORDER — TRANEXAMIC ACID-NACL 1000-0.7 MG/100ML-% IV SOLN
INTRAVENOUS | Status: AC
  Filled 2022-05-10: qty 100

## 2022-05-10 MED ORDER — SUCCINYLCHOLINE CHLORIDE 200 MG/10ML IV SOSY
PREFILLED_SYRINGE | INTRAVENOUS | Status: DC | PRN
  Administered 2022-05-10: 80 mg via INTRAVENOUS

## 2022-05-10 MED ORDER — PROPOFOL 10 MG/ML IV BOLUS
INTRAVENOUS | Status: DC | PRN
  Administered 2022-05-10: 50 mg via INTRAVENOUS

## 2022-05-10 MED ORDER — PROPOFOL 500 MG/50ML IV EMUL
INTRAVENOUS | Status: AC
  Filled 2022-05-10: qty 50

## 2022-05-10 SURGICAL SUPPLY — 45 items
BAG COUNTER SPONGE SURGICOUNT (BAG) ×1 IMPLANT
BIT DRILL INTERTAN LAG SCREW (BIT) IMPLANT
BIT DRILL LONG 4.0 (BIT) IMPLANT
CHLORAPREP W/TINT 26 (MISCELLANEOUS) ×1 IMPLANT
COVER MAYO STAND STRL (DRAPES) IMPLANT
COVER PERINEAL POST (MISCELLANEOUS) ×1 IMPLANT
COVER SURGICAL LIGHT HANDLE (MISCELLANEOUS) ×1 IMPLANT
DERMABOND ADVANCED .7 DNX12 (GAUZE/BANDAGES/DRESSINGS) IMPLANT
DRAPE C-ARMOR (DRAPES) ×1 IMPLANT
DRAPE STERI IOBAN 125X83 (DRAPES) ×1 IMPLANT
DRAPE U-SHAPE 47X51 STRL (DRAPES) ×2 IMPLANT
DRESSING MEPILEX FLEX 4X4 (GAUZE/BANDAGES/DRESSINGS) IMPLANT
DRILL BIT LONG 4.0 (BIT) ×1
DRSG MEPILEX FLEX 4X4 (GAUZE/BANDAGES/DRESSINGS)
DRSG TEGADERM 4X10 (GAUZE/BANDAGES/DRESSINGS) IMPLANT
DRSG TEGADERM 4X4.75 (GAUZE/BANDAGES/DRESSINGS) ×3 IMPLANT
ELECT REM PT RETURN 15FT ADLT (MISCELLANEOUS) IMPLANT
GAUZE SPONGE 4X4 12PLY STRL (GAUZE/BANDAGES/DRESSINGS) ×1 IMPLANT
GLOVE BIO SURGEON STRL SZ7.5 (GLOVE) ×2 IMPLANT
GLOVE BIO SURGEON STRL SZ8 (GLOVE) ×1 IMPLANT
GLOVE BIOGEL PI IND STRL 7.5 (GLOVE) ×1 IMPLANT
GLOVE BIOGEL PI IND STRL 8 (GLOVE) ×1 IMPLANT
GLOVE SURG POLYISO LF SZ7.5 (GLOVE) ×1 IMPLANT
GOWN STRL REUS W/ TWL LRG LVL3 (GOWN DISPOSABLE) ×1 IMPLANT
GOWN STRL REUS W/ TWL XL LVL3 (GOWN DISPOSABLE) ×2 IMPLANT
GOWN STRL REUS W/TWL LRG LVL3 (GOWN DISPOSABLE) ×1
GOWN STRL REUS W/TWL XL LVL3 (GOWN DISPOSABLE) ×2
GUIDE PIN 3.2X343 (PIN) ×2
GUIDE PIN 3.2X343MM (PIN) ×2
KIT BASIN OR (CUSTOM PROCEDURE TRAY) ×1 IMPLANT
KIT TURNOVER KIT A (KITS) IMPLANT
MANIFOLD NEPTUNE II (INSTRUMENTS) ×1 IMPLANT
NAIL INTERTAN 10X18 130D 10S (Nail) IMPLANT
NS IRRIG 1000ML POUR BTL (IV SOLUTION) ×1 IMPLANT
PACK GENERAL/GYN (CUSTOM PROCEDURE TRAY) ×1 IMPLANT
PIN GUIDE 3.2X343MM (PIN) IMPLANT
SCREW LAG COMPR KIT 110/105 (Screw) IMPLANT
SCREW TRIGEN LOW PROF 5.0X32.5 (Screw) IMPLANT
STRIP CLOSURE SKIN 1/2X4 (GAUZE/BANDAGES/DRESSINGS) ×1 IMPLANT
SUT MNCRL AB 3-0 PS2 18 (SUTURE) ×1 IMPLANT
SUT VIC AB 0 CT1 36 (SUTURE) ×1 IMPLANT
SUT VIC AB 1 CT1 36 (SUTURE) ×1 IMPLANT
SUT VIC AB 2-0 CT2 27 (SUTURE) ×2 IMPLANT
TOWEL OR 17X26 10 PK STRL BLUE (TOWEL DISPOSABLE) ×1 IMPLANT
TOWEL OR NON WOVEN STRL DISP B (DISPOSABLE) ×1 IMPLANT

## 2022-05-10 NOTE — Interval H&P Note (Signed)
The patient has been re-examined, and the chart reviewed, and there have been no interval changes to the documented history and physical.    Plan for Left intertroch femur fx ORIF with cephallomedullary nail  The operative side was examined and the patient was confirmed to have sensation to DPN, SPN, TN intact, Motor EHL, ext, flex 5/5, and DP 2+, PT 2+, No significant edema.   The risks, benefits, and alternatives have been discussed at length with patient and his wife, and the patient is willing to proceed.  Left hip marked. Consent has been signed.

## 2022-05-10 NOTE — Discharge Instructions (Addendum)
Orthopedic Discharge Instructions  Diet: As you were doing prior to hospitalization   Shower:  May shower but keep the wounds dry, use an occlusive plastic wrap, NO SOAKING IN TUB.  If the bandage gets wet, change with a clean dry gauze. Dressing:  You may change your dressing 3-5 days after surgery, unless you have a splint.  If the dressing remains clean and dry it can also be left on until follow up. If you change the dressing replace with clean gauze and tape or ace wrap.  Activity:  Increase activity slowly as tolerated, but follow the weight bearing instructions below.  The rules on driving is that you can not be taking narcotics while you drive, and you must feel in control of the vehicle.    Weight Bearing:   weight bearing as tolerated left lower extremity.    Blood clot prevention (DVT Prophylaxis): After surgery you are at an increased risk for a blood clot. you were prescribed a blood thinner, lovenox 40mg , to be taken once daily for a total of 4 weeks from surgery to help reduce your risk of getting a blood clot. This will help prevent a blood clot. Signs of a pulmonary embolus (blood clot in the lungs) include sudden short of breath, feeling lightheaded or dizzy, chest pain with a deep breath, rapid pulse rapid breathing. Signs of a blood clot in your arms or legs include new unexplained swelling and cramping, warm, red or darkened skin around the painful area. Please call the office or 911 right away if these signs or symptoms develop. To prevent constipation: you may use a stool softener such as -  Colace (over the counter) 100 mg by mouth twice a day  Drink plenty of fluids (prune juice may be helpful) and high fiber foods Miralax (over the counter) for constipation as needed.    Itching:  If you experience itching with your medications, try taking only a single pain pill, or even half a pain pill at a time.  You may take up to 10 pain pills per day, and you can also use benadryl  over the counter for itching or also to help with sleep.   Precautions:  If you experience chest pain or shortness of breath - call 911 immediately for transfer to the hospital emergency department!!   Call office [phone]) for the following: Temperature greater than 101F Persistent nausea and vomiting Severe uncontrolled pain Redness, tenderness, or signs of infection (pain, swelling, redness, odor or green/yellow discharge around the site) Difficulty breathing, headache or visual disturbances Hives Persistent dizziness or light-headedness Extreme fatigue Any other questions or concerns you may have after discharge  In an emergency, call 911 or go to an Emergency Department at a nearby hospital  Follow- Up Appointment:  Please call for an appointment to be seen approximately 2-3 week after surgery in [facility] with your surgeon [facility] - [phone] Address: [address], [place], [place]  Call [facility] to establish follow-up for catheter removal at [phone].

## 2022-05-10 NOTE — Progress Notes (Signed)
Unsuccessful attempt with foley insertion.

## 2022-05-10 NOTE — Progress Notes (Signed)
PROGRESS NOTE  Timothy Spencer  DOB: November 10, 1940  PCP: Deland Pretty, MD [id number]  DOA: 05/09/2022  LOS: 1 day  Hospital Day: 2  Brief narrative: Timothy Spencer is a 81 y.o. male with PMH significant for HTN, HLD, COPD, CAD, CKD stage IIIa, RBBB, hypothyroidism, RLS, BPH. 12/12, patient was brought from home to the ED by EMS for evaluation of left hip pain after a fall. Patient states he was walking downstairs  when he missed a step and fell onto his left hip.  He had severe pain and was unable to stand up on his own.  He did not hit his head or lose consciousness.    In the ED, patient was afebrile, blood pressure elevated 168/76 Labs with WBC count 9.3, hemoglobin 13.3, platelets 123,000, sodium 140, potassium 4.3, bicarb 22, BUN 20, creatinine 1.41, serum glucose 128. Left hip x-ray showed a mildly impacted left intertrochanteric fracture. Portable chest x-ray showed chronically elevated right hemidiaphragm.  No focal consolidation, edema, effusion. Orthopedics, Dr. Zachery Dakins, was consulted from ED.  Recommended medical admission with plan for potential surgical fixation on 12/13. Admitted to [facility]  Subjective: Patient was seen and examined this morning.  Pleasant elderly Caucasian male.  Lying on bed.  Was in pain at the fracture site at time of my evaluation.  No other complaint.  Pending surgery today.  No family at bedside. Chart reviewed Afebrile, heart rate in 90s, blood pressure stable, breathing on room air Reviewed labs from this morning with creatinine trending up to 1.6  Assessment and plan: Acute impacted left intertrochanteric fracture Secondary to mechanical fall  Tentative plan of surgical fixation today  Remain n.p.o.  Pain control and DVT prophylaxis per orthopedics  Based on RCRI patient has a 6.0% 30-day perioperative risk of death, MI, or cardiac arrest (hx of CAD). Patient understands and agrees to proceed with surgery  Severe vitamin D  deficiency. Vitamin D level severely low at 9.8.  Will order for vitamin D replacement.   CAD (coronary artery disease) HLD Stable, denies any chest pain.  Does not appear to be on antiplatelet.  Continue rosuvastatin.   COPD (chronic obstructive pulmonary disease)  Stable, no wheezing on admission.   Continue Breztri BID DuoNeb and albuterol as needed   Essential hypertension Continue amlodipine.  I would keep losartan on hold perioperatively   Chronic kidney disease, stage 3a  Renal function stable.  Continue to monitor.   Hypothyroid Continue Synthroid.   Thrombocytopenia  Mild without obvious bleeding.  Continue to monitor. Recent Labs  Lab 05/09/22 1940 05/10/22 0112  PLT 123* 129*   Anxiety and depression Continue bupropion and sertraline.    Goals of care   Code Status: Full Code    Mobility: Needs PT eval postprocedure  Scheduled Meds:  amLODipine  10 mg Oral Daily   buPROPion  150 mg Oral q morning   latanoprost  1 drop Both Eyes QHS   levothyroxine  112 mcg Oral Daily   mometasone-formoterol  2 puff Inhalation BID   And   umeclidinium bromide  1 puff Inhalation Daily   rOPINIRole  1 mg Oral TID   rosuvastatin  5 mg Oral Daily   sertraline  100 mg Oral Daily   tamsulosin  0.4 mg Oral Daily   Vitamin D (Ergocalciferol)  50,000 Units Oral Q7 days    PRN meds: acetaminophen, albuterol, HYDROcodone-acetaminophen, HYDROmorphone (DILAUDID) injection, ipratropium-albuterol, ondansetron (ZOFRAN) IV, senna-docusate   Infusions:     Skin assessment:  Nutritional status:  Body mass index is 23.92 kg/m.          Diet:  Diet Order             Diet NPO time specified  Diet effective midnight                   DVT prophylaxis:  SCDs Start: 05/09/22 2136   Antimicrobials: None Fluid: LR at 100/h Consultants: Orthopedics Family Communication: No family at bedside  Status is: Inpatient  Continue in-hospital care because:  Pending surgery today Level of care: Med-Surg   Dispo: The patient is from: Home              Anticipated d/c is to: Pending clinical course              Patient currently is not medically stable to d/c.   Difficult to place patient No    Antimicrobials: Anti-infectives (From admission, onward)    None       Objective: Vitals:   05/10/22 0804 05/10/22 0859  BP:  (!) 94/57  Pulse:  96  Resp:  17  Temp:  97.8 F (36.6 C)  SpO2: 97% (!) 85%    Intake/Output Summary (Last 24 hours) at 05/10/2022 1105 Last data filed at 05/10/2022 0528 Gross per 24 hour  Intake 1057.04 ml  Output --  Net 1057.04 ml   Filed Weights   05/09/22 2211  Weight: 77.8 kg   Weight change:  Body mass index is 23.92 kg/m.   Physical Exam: General exam: Pleasant, elderly Caucasian male.  In mild to moderate distress from pain at the fracture site Skin: No rashes, lesions or ulcers. HEENT: Atraumatic, normocephalic, no obvious bleeding Lungs: Clear to auscultation bilaterally CVS: Regular rate and rhythm, no murmur GI/Abd soft, nontender, nondistended, bowel sounds present CNS: Alert, awake, oriented x 3 Psychiatry: Frustrated because of pain Extremities: No pedal edema, no calf tenderness  Data Review: I have personally reviewed the laboratory data and studies available.  F/u labs ordered Unresulted Labs (From admission, onward)     Start     Ordered   05/10/22 1024  Surgical pcr screen  Once,   R        05/10/22 1024            Signed, Lorin Glass, MD [facility] 05/10/2022

## 2022-05-10 NOTE — Anesthesia Preprocedure Evaluation (Addendum)
Anesthesia Evaluation  Patient identified by MRN, date of birth, ID band Patient awake    Reviewed: Allergy & Precautions, NPO status , Patient's Chart, lab work & pertinent test results  Airway Mallampati: I  TM Distance: >3 FB Neck ROM: Full    Dental no notable dental hx.    Pulmonary sleep apnea , COPD,  COPD inhaler, former smoker   Pulmonary exam normal        Cardiovascular hypertension, Pt. on medications + CAD  Normal cardiovascular exam     Neuro/Psych  PSYCHIATRIC DISORDERS Anxiety Depression    negative neurological ROS     GI/Hepatic negative GI ROS, Neg liver ROS,,,  Endo/Other  Hypothyroidism    Renal/GU Renal disease     Musculoskeletal   Abdominal   Peds  Hematology  (+) Blood dyscrasia, anemia   Anesthesia Other Findings Left intertrochtanertic femur fracture  Reproductive/Obstetrics                             Anesthesia Physical Anesthesia Plan  ASA: 3  Anesthesia Plan: General   Post-op Pain Management:    Induction: Intravenous  PONV Risk Score and Plan: 2 and Ondansetron, Dexamethasone and Treatment may vary due to age or medical condition  Airway Management Planned: Oral ETT  Additional Equipment:   Intra-op Plan:   Post-operative Plan: Extubation in OR  Informed Consent: I have reviewed the patients History and Physical, chart, labs and discussed the procedure including the risks, benefits and alternatives for the proposed anesthesia with the patient or authorized representative who has indicated his/her understanding and acceptance.     Dental advisory given  Plan Discussed with: CRNA  Anesthesia Plan Comments:        Anesthesia Quick Evaluation

## 2022-05-10 NOTE — Progress Notes (Signed)
Report given to Darrin Luis RN at [facility]

## 2022-05-10 NOTE — Anesthesia Postprocedure Evaluation (Signed)
Anesthesia Post Note  Patient: Timothy Spencer  Procedure(s) Performed: AN AD HOC NERVE BLOCK     Patient location during evaluation: Short Stay Anesthesia Type: Regional Level of consciousness: awake Pain management: pain level controlled Vital Signs Assessment: post-procedure vital signs reviewed and stable Respiratory status: spontaneous breathing, nonlabored ventilation and respiratory function stable Cardiovascular status: blood pressure returned to baseline and stable Postop Assessment: no apparent nausea or vomiting Anesthetic complications: no   No notable events documented.  Last Vitals:  Vitals:   05/10/22 1155 05/10/22 1200  BP: 114/64 113/60  Pulse: 97 98  Resp: 17 16  Temp:    SpO2: 96% 94%    Last Pain:  Vitals:   05/10/22 1200  TempSrc:   PainSc: 3                  [place]hower P Charmika Macdonnell

## 2022-05-10 NOTE — Plan of Care (Signed)

## 2022-05-10 NOTE — H&P (View-Only) (Signed)
ORTHOPAEDIC CONSULTATION  REQUESTING PHYSICIAN: Dahal, Binaya, MD  Chief Complaint: Fall with left hip fracture  HPI: Timothy Spencer is a 81 y.o. male who sustained a fall last night going down a step.  Was unable to ambulate after the fall and EMS was called.  He had pain in the left hip and groin area.  Denies pain in other joints or extremities.  Denies distal numbness and tingling.  Denies any pain in the hip prior to the fall.  Past Medical History:  Diagnosis Date   Anxiety and depression    BPH (benign prostatic hyperplasia)    CAD (coronary artery disease)    COPD (chronic obstructive pulmonary disease) (HCC)    DOE (dyspnea on exertion)    ED (erectile dysfunction)    Emphysema of lung (HCC)    First degree AV block    GERD (gastroesophageal reflux disease)    Glaucoma    Gout    Gynecomastia    H/O ETOH abuse    Hemochromatosis    Hyperlipidemia    Hypertension    Hypothyroidism    Macular degeneration    OSA (obstructive sleep apnea)    Osteopenia    Peripheral neuropathy    RLS (restless legs syndrome)    Thrombocytopenia (HCC)    Past Surgical History:  Procedure Laterality Date   CARDIAC CATHETERIZATION  03/27/2008   Medical therapy   CARDIAC CATHETERIZATION  06/03/2010   Medical theray and smoking cessation   CARDIAC CATHETERIZATION N/A 11/24/2014   Procedure: Left Heart Cath and Coronary Angiography;  Surgeon: Thomas A Kelly, MD;  Location: [facility];  Service: Cardiovascular;  Laterality: N/A;   CARDIOPULMONARY EXERCISE TEST  02/12/2012   Peak VO2 63% of predicted   ORIF FEMUR FRACTURE Right    TRANSTHORACIC ECHOCARDIOGRAM  11/28/2011   EF >55%, normal   Social History   Socioeconomic History   Marital status: Married    Spouse name: Not on file   Number of children: Not on file   Years of education: Not on file   Highest education level: Not on file  Occupational History   Not on file  Tobacco Use   Smoking status: Former     Packs/day: 2.00    Years: 50.00    Total pack years: 100.00    Types: Cigarettes    Start date: 04/11/1960    Quit date: 03/19/2011    Years since quitting: 11.1   Smokeless tobacco: Never   Tobacco comments:    Quit for 1 year total   Substance and Sexual Activity   Alcohol use: No    Alcohol/week: 0.0 standard drinks of alcohol   Drug use: No   Sexual activity: Not on file  Other Topics Concern   Not on file  Social History Narrative   ** Merged History Encounter **        Pulmonary (03/06/17): Originally from [place], [place]. Has always lived in [place]. Has traveled to [place], [place], [place], [place], [place], & [place]. Has a dog currently. No bird exposure. No mold or hot tub exposure. Previously enjoyed riding horse   s and playing golf. Currently has no hobbies. 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    Social Determinants of Health   Financial Resource Strain: Not on file  Food Insecurity: Not on file  Transportation Needs: Not on file  Physical Activity: Not on file  Stress: Not on   file  Social Connections: Not on file   Family History  Adopted: Yes   Allergies  Allergen Reactions   Hydrochlorothiazide W-Spironolactone     Other Reaction(s): dizziness, fainting     Positive ROS: All other systems have been reviewed and were otherwise negative with the exception of those mentioned in the HPI and as above.  Physical Exam: General: Alert, no acute distress Cardiovascular: No pedal edema Respiratory: No cyanosis, no use of accessory musculature Skin: No lesions in the area of chief complaint Neurologic: Sensation intact distally Psychiatric: Patient is competent for consent with normal mood and affect  MUSCULOSKELETAL:  LLE No traumatic wounds, ecchymosis, or rash  Tender about the left hip, mild swelling, skin intact  No knee or ankle effusion  Sens DPN, SPN, TN intact  Motor EHL, ext, flex 5/5  DP  2+, PT 2+, No significant edema   IMAGING: X-rays demonstrate minimally displaced basicervical intertrochanteric femur fracture  Assessment: Principal Problem:   Closed intertrochanteric fracture of left femur, initial encounter (HCC) Active Problems:   Hypothyroid   Hyperlipidemia with target LDL less than 70   CAD (coronary artery disease)   Essential hypertension   COPD (chronic obstructive pulmonary disease) (HCC)   Anxiety and depression   Thrombocytopenia (HCC)   Chronic kidney disease, stage 3a (HCC)  Left basicervical intertrochanteric femur fracture  Plan: Met with patient this morning.  Discussed x-ray findings showing intertrochanteric femur fracture.  To allow for pain control early mobilization would recommend surgical fixation with soft medullary nail.The risks benefits and alternatives were discussed with the patient including but not limited to the risks of nonoperative treatment, versus surgical intervention including infection, bleeding, nerve injury, malunion, nonunion, the need for revision surgery, hardware prominence, hardware failure, the need for hardware removal, blood clots, cardiopulmonary complications, morbidity, mortality, among others, and they were willing to proceed.      Ashima Shrake A Shelly Shoultz, MD  Contact information:   Weekdays 7am-5pm epic message Dr. Jie Stickels, or call office for patient follow up: [phone] After hours and holidays please check [email or web address] for group call information for [facility] 

## 2022-05-10 NOTE — Consult Note (Signed)
ORTHOPAEDIC CONSULTATION  REQUESTING PHYSICIAN: Terrilee Croak, MD  Chief Complaint: Fall with left hip fracture  HPI: Timothy Spencer is a 81 y.o. male who sustained a fall last night going down a step.  Was unable to ambulate after the fall and EMS was called.  He had pain in the left hip and groin area.  Denies pain in other joints or extremities.  Denies distal numbness and tingling.  Denies any pain in the hip prior to the fall.  Past Medical History:  Diagnosis Date   Anxiety and depression    BPH (benign prostatic hyperplasia)    CAD (coronary artery disease)    COPD (chronic obstructive pulmonary disease) (HCC)    DOE (dyspnea on exertion)    ED (erectile dysfunction)    Emphysema of lung (HCC)    First degree AV block    GERD (gastroesophageal reflux disease)    Glaucoma    Gout    Gynecomastia    H/O ETOH abuse    Hemochromatosis    Hyperlipidemia    Hypertension    Hypothyroidism    Macular degeneration    OSA (obstructive sleep apnea)    Osteopenia    Peripheral neuropathy    RLS (restless legs syndrome)    Thrombocytopenia ([place])    Past Surgical History:  Procedure Laterality Date   CARDIAC CATHETERIZATION  03/27/2008   Medical therapy   CARDIAC CATHETERIZATION  06/03/2010   Medical theray and smoking cessation   CARDIAC CATHETERIZATION N/A 11/24/2014   Procedure: Left Heart Cath and Coronary Angiography;  Surgeon: Troy Sine, MD;  Location: [facility];  Service: Cardiovascular;  Laterality: N/A;   CARDIOPULMONARY EXERCISE TEST  02/12/2012   Peak VO2 63% of predicted   ORIF FEMUR FRACTURE Right    TRANSTHORACIC ECHOCARDIOGRAM  11/28/2011   EF >55%, normal   Social History   Socioeconomic History   Marital status: Married    Spouse name: Not on file   Number of children: Not on file   Years of education: Not on file   Highest education level: Not on file  Occupational History   Not on file  Tobacco Use   Smoking status: Former     Packs/day: 2.00    Years: 50.00    Total pack years: 100.00    Types: Cigarettes    Start date: 04/11/1960    Quit date: 03/19/2011    Years since quitting: 11.1   Smokeless tobacco: Never   Tobacco comments:    Quit for 1 year total   Substance and Sexual Activity   Alcohol use: No    Alcohol/week: 0.0 standard drinks of alcohol   Drug use: No   Sexual activity: Not on file  Other Topics Concern   Not on file  Social History Narrative   ** Merged History Encounter **       [place] [facility] (03/06/17): Originally from [place], [place]. Has always lived in [place]. Has traveled to [place], [place], [place], [place], [place], & [place]. Has a dog currently. No bird exposure. No mold or hot tub exposure. Previously enjoyed riding horse   s and playing golf. Currently has no hobbies. 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    Social Determinants of Health   Financial Resource Strain: Not on file  Food Insecurity: Not on file  Transportation Needs: Not on file  Physical Activity: Not on file  Stress: Not on  file  Social Connections: Not on file   Family History  Adopted: Yes   Allergies  Allergen Reactions   Hydrochlorothiazide W-Spironolactone     Other Reaction(s): dizziness, fainting     Positive ROS: All other systems have been reviewed and were otherwise negative with the exception of those mentioned in the HPI and as above.  Physical Exam: General: Alert, no acute distress Cardiovascular: No pedal edema Respiratory: No cyanosis, no use of accessory musculature Skin: No lesions in the area of chief complaint Neurologic: Sensation intact distally Psychiatric: Patient is competent for consent with normal mood and affect  MUSCULOSKELETAL:  LLE No traumatic wounds, ecchymosis, or rash  Tender about the left hip, mild swelling, skin intact  No knee or ankle effusion  Sens DPN, SPN, TN intact  Motor EHL, ext, flex 5/5  DP  2+, PT 2+, No significant edema   IMAGING: X-rays demonstrate minimally displaced basicervical intertrochanteric femur fracture  Assessment: Principal Problem:   Closed intertrochanteric fracture of left femur, initial encounter (HCC) Active Problems:   Hypothyroid   Hyperlipidemia with target LDL less than 70   CAD (coronary artery disease)   Essential hypertension   COPD (chronic obstructive pulmonary disease) (HCC)   Anxiety and depression   Thrombocytopenia (HCC)   Chronic kidney disease, stage 3a (HCC)  Left basicervical intertrochanteric femur fracture  Plan: Met with patient this morning.  Discussed x-ray findings showing intertrochanteric femur fracture.  To allow for pain control early mobilization would recommend surgical fixation with soft medullary nail.The risks benefits and alternatives were discussed with the patient including but not limited to the risks of nonoperative treatment, versus surgical intervention including infection, bleeding, nerve injury, malunion, nonunion, the need for revision surgery, hardware prominence, hardware failure, the need for hardware removal, blood clots, cardiopulmonary complications, morbidity, mortality, among others, and they were willing to proceed.      Willaim Sheng, MD  Contact information:   HRVACQPE 7am-5pm epic message Dr. Zachery Dakins, or call office for patient follow up: [phone] After hours and holidays please check [email or web address] for group call information for [facility]

## 2022-05-10 NOTE — Op Note (Signed)
DATE OF SURGERY:  05/10/2022  TIME: 5:23 PM  PATIENT NAME:  Timothy Spencer  AGE: 81 y.o.  PRE-OPERATIVE DIAGNOSIS:  Left intertrochtanertic femur fracture  POST-OPERATIVE DIAGNOSIS:  SAME  PROCEDURE:  INTRAMEDULLARY (IM) NAIL FEMORAL  SURGEON:  Lori-Ann Lindfors A Elyn Krogh  ASSISTANT:  Izola Price, RNFA, was present and scrubbed throughout the case, critical for assistance with exposure, retraction, instrumentation, and closure.  OPERATIVE IMPLANTS:  Tamala [employer] Intertan Nail 10 x 180 mm , 110/105 lag compression screw, 32.5 distal interlock  Implant Name Type Inv. Item Serial No. Manufacturer Lot No. LRB No. Used Action  NAIL INTERTAN 10X18 130D 10S - [id number] Nail NAIL INTERTAN 10X18 130D 10S  [employer] [id number] Left 1 Implanted  SCREW LAG COMPR KIT 110/105 - [id number] Screw SCREW LAG COMPR KIT 110/105  [facility] [employer] [id number] Left 1 Implanted  SCREW TRIGEN LOW PROF 5.0X32.5 - [id number] Screw SCREW TRIGEN LOW PROF 5.0X32.5  [employer] [id number] Left 1 Implanted    ESTIMATED BLOOD LOSS: 100cc  PREOPERATIVE INDICATIONS:  Timothy Spencer is a 81 y.o. year old who fell and suffered an Left intertrochtanertic femur fracture. He was brought into the ER and then admitted and optimized and then elected for surgical intervention.    The risks benefits and alternatives were discussed with the patient including but not limited to the risks of nonoperative treatment, versus surgical intervention including infection, bleeding, nerve injury, malunion, nonunion, hardware prominence, hardware failure, need for hardware removal, blood clots, cardiopulmonary complications, morbidity, mortality, among others, and they were willing to proceed.    OPERATIVE PROCEDURE:  The patient was brought to the operating room and placed in the supine position. Anesthesia was administered. He was placed on the fracture table.  Closed reduction was performed  under C-arm guidance.  Time out was then performed after sterile prep and drape. He received preoperative antibiotics.  Small incision proximal to the greater trochanter was made and carried down through skin and subcutaneous tissue.  Threaded guidewire was directed at the tip of the greater trochanter and advanced into the proximal metaphysis.  Positioning was confirmed with fluoroscopy.  I then used an entry reamer to enter the medullary canal.  I then passed a 10 x 180 mm InterTAN down the center of the canal attached to the targeting arm.  I then used the targeting arm to make a percutaneous incision and directed a threaded guidewire up into the head/neck segment.  I confirmed adequate tip apex distance and measured the length.  I decided to place a 110 mm screw.  I then drilled the path for the compression screw and placed an antirotation bar.  I then placed the lag screw and then placed the compression screw and compressed approximately 5 mm.  The proximal portion of the nail was statically locked.   I used the targeting arm to place a distal interlocking screw.  The targeting arm was removed.  Final fluoroscopic imaging was obtained.    The wounds were irrigated copiously, and vancomycin powder was placed in the wounds.  The gluteal fascia was closed with 0 Vicryl, and skin was closed with 2-0 Vicryl and 3-0 Monocryl.  Sterile dressing was applied with Dermabond, 4 x 4 gauze, and Tegaderm.  The patient was awakened and returned to PACU in stable and satisfactory condition. There were no complications and the patient tolerated the procedure well.   Post op recs: WB: WBAT RLE Abx: ancef x23 hours post op  Imaging: PACU xrays Dressing: keep intact until follow up, change PRN if soiled or saturated. DVT prophylaxis: lovenox starting POD1 x4 weeks Follow up: 2 weeks after surgery for a wound check with Dr. Zachery Dakins at [facility].  Address: [address], [place], [place]  [address]  Office Phone: [phone]  Charlies Constable, MD Orthopaedic Surgery

## 2022-05-10 NOTE — Anesthesia Procedure Notes (Signed)
Anesthesia Regional Block: Femoral nerve block   Pre-Anesthetic Checklist: , timeout performed,  Correct Patient, Correct Site, Correct Laterality,  Correct Procedure,, site marked,  Risks and benefits discussed,  Surgical consent,  Pre-op evaluation,  At surgeon's request and post-op pain management  Laterality: Left  Prep: chloraprep       Needles:  Injection technique: Single-shot  Needle Type: Echogenic Stimulator Needle     Needle Length: 9cm  Needle Gauge: 21     Additional Needles:   Procedures:, nerve stimulator,,, ultrasound used (permanent image in chart),,     Nerve Stimulator or Paresthesia:  Response: Quadriceps muscle contraction, 0.6 mA  Additional Responses:   Narrative:  Start time: 05/10/2022 11:45 AM End time: 05/10/2022 11:55 AM Injection made incrementally with aspirations every 5 mL.  Performed by: Personally  Anesthesiologist: Leonides Grills, MD  Additional Notes: Functioning IV was confirmed and monitors were applied.  A 60mm 21ga Arrow echogenic stimulator needle was used. Sterile prep and drape,hand hygiene and sterile gloves were used.  Negative aspiration and negative test dose prior to incremental administration of local anesthetic. The patient tolerated the procedure well.

## 2022-05-10 NOTE — Progress Notes (Signed)
Initial Nutrition Assessment  DOCUMENTATION CODES:   Severe malnutrition in context of chronic illness  INTERVENTION: - Advance diet to Regular as medically appropriate after OR. - Recommend Ensure Enlive po BID once diet advanced. Each supplement provides 350 kcal and 20 grams of protein. - Monitor weight trends   NUTRITION DIAGNOSIS:   Severe Malnutrition related to chronic illness (COPD, CAD, CKD) as evidenced by severe fat depletion, severe muscle depletion.  GOAL:   Patient will meet greater than or equal to 90% of their needs  MONITOR:   PO intake, Weight trends, Supplement acceptance, Diet advancement  REASON FOR ASSESSMENT:   Consult Hip fracture protocol  ASSESSMENT:   81 y.o. male with medical history significant for COPD, CAD, CKD stage IIIa, RBBB, HTN, HLD, hypothyroidism, RLS, BPH who presented with left hip pain after a fall. Found to have mildly impacted left intertrochanteric fracture.   Patient reports a UBW of 174# and denied recent changes in weight. Per EMR, weight has slowly trended down since March but the loss is not significant for the time frame.  Patient reports eating well PTA, and endorses he "always" has 3 meals a day.   Food recall as below: Breakfast: fruit and another item patient could not remember Lunch: bologna sandwich with lettuce and tomato OR a cheeseburger Dinner: meat with beans and potatoes  Will go through periods of drinking Ensure and is not consistent. Patient shares he may go 12 weeks of drinking the supplement but then stop for awhile before starting again.   Patient admits his appetite is decreased at baseline but he eats well anyway. He is not feeling hungry due to pain at time of visit but is current NPO for OR today. Patient agreeable to receive Ensure during admission to support intake once diet advanced.    Medications reviewed and include: Synthroid, Zofran prn  Labs reviewed:  Creatinine 1.60   NUTRITION -  FOCUSED PHYSICAL EXAM:  Flowsheet Row Most Recent Value  Orbital Region Mild depletion  Upper Arm Region Severe depletion  Thoracic and Lumbar Region Severe depletion  Buccal Region Severe depletion  Temple Region Severe depletion  Clavicle Bone Region Moderate depletion  Clavicle and Acromion Bone Region Moderate depletion  Scapular Bone Region Unable to assess  Dorsal Hand Severe depletion  Patellar Region Severe depletion  Anterior Thigh Region Severe depletion  Posterior Calf Region Severe depletion  Edema (RD Assessment) None  Hair Reviewed  Eyes Reviewed  Mouth Reviewed  Skin Reviewed  Nails Reviewed       Diet Order:   Diet Order             Diet NPO time specified  Diet effective midnight                   EDUCATION NEEDS:  Education needs have been addressed  Skin:  Skin Assessment: Reviewed RN Assessment  Last BM:  12/12  Height:  Ht Readings from Last 1 Encounters:  05/10/22 5\' 11"  (1.803 m)   Weight:  Wt Readings from Last 1 Encounters:  05/10/22 77 kg   Ideal Body Weight:  78.18 kg  BMI:  Body mass index is 23.68 kg/m.  Estimated Nutritional Needs:  Kcal:  05/12/22 kcals Protein:  110-125 grams Fluid:  >/= 2.1L    [id number] RD, LDN For contact information, refer to [facility].

## 2022-05-10 NOTE — Anesthesia Preprocedure Evaluation (Signed)
Anesthesia Evaluation  Patient identified by MRN, date of birth, ID band Patient awake    Airway        Dental   Pulmonary sleep apnea , COPD,  COPD inhaler, former smoker          Cardiovascular hypertension, Pt. on medications + CAD       Neuro/Psych  PSYCHIATRIC DISORDERS Anxiety Depression       GI/Hepatic   Endo/Other  Hypothyroidism    Renal/GU Renal disease     Musculoskeletal   Abdominal   Peds  Hematology  (+) Blood dyscrasia, anemia   Anesthesia Other Findings Left intertrochtanertic femur fracture  Reproductive/Obstetrics                             Anesthesia Physical Anesthesia Plan  ASA:   Anesthesia Plan: Regional   Post-op Pain Management:    Induction:   PONV Risk Score and Plan:   Airway Management Planned:   Additional Equipment:   Intra-op Plan:   Post-operative Plan:   Informed Consent:   Plan Discussed with:   Anesthesia Plan Comments:        Anesthesia Quick Evaluation

## 2022-05-10 NOTE — Transfer of Care (Signed)
Immediate Anesthesia Transfer of Care Note  Patient: Timothy Spencer  Procedure(s) Performed: INTRAMEDULLARY (IM) NAIL FEMORAL (Left: Hip)  Patient Location: PACU  Anesthesia Type:General  Level of Consciousness: awake and patient cooperative  Airway & Oxygen Therapy: Patient Spontanous Breathing and Patient connected to face mask oxygen  Post-op Assessment: Report given to RN and Post -op Vital signs reviewed and stable  Post vital signs: Reviewed and stable  Last Vitals:  Vitals Value Taken Time  BP 140/54 05/10/22 1746  Temp    Pulse 82 05/10/22 1755  Resp 14 05/10/22 1755  SpO2 100 % 05/10/22 1755  Vitals shown include unvalidated device data.  Last Pain:  Vitals:   05/10/22 1542  TempSrc:   PainSc: 8       Patients Stated Pain Goal: 5 (05/10/22 1200)  Complications: No notable events documented.

## 2022-05-10 NOTE — Progress Notes (Signed)
Patient is having anxiety.Messaged Timothy Spencer to request medication.

## 2022-05-11 LAB — BASIC METABOLIC PANEL
Anion gap: 10 (ref 5–15)
BUN: 41 mg/dL — ABNORMAL HIGH (ref 8–23)
CO2: 18 mmol/L — ABNORMAL LOW (ref 22–32)
Calcium: 8.3 mg/dL — ABNORMAL LOW (ref 8.9–10.3)
Chloride: 107 mmol/L (ref 98–111)
Creatinine, Ser: 2.1 mg/dL — ABNORMAL HIGH (ref 0.61–1.24)
GFR, Estimated: 31 mL/min — ABNORMAL LOW (ref >60)
Glucose, Bld: 100 mg/dL — ABNORMAL HIGH (ref 70–99)
Potassium: 4.3 mmol/L (ref 3.5–5.1)
Sodium: 135 mmol/L (ref 135–145)

## 2022-05-11 LAB — CBC
HCT: 26.6 % — ABNORMAL LOW (ref 39.0–52.0)
Hemoglobin: 9 g/dL — ABNORMAL LOW (ref 13.0–17.0)
MCH: 32.8 pg (ref 26.0–34.0)
MCHC: 33.8 g/dL (ref 30.0–36.0)
MCV: 97.1 fL (ref 80.0–100.0)
Platelets: 116 10*3/uL — ABNORMAL LOW (ref 150–400)
RBC: 2.74 MIL/uL — ABNORMAL LOW (ref 4.22–5.81)
RDW: 14.5 % (ref 11.5–15.5)
WBC: 9.6 10*3/uL (ref 4.0–10.5)
nRBC: 0 % (ref 0.0–0.2)

## 2022-05-11 MED ORDER — ENOXAPARIN SODIUM 40 MG/0.4ML IJ SOSY
30.0000 mg | PREFILLED_SYRINGE | INTRAMUSCULAR | Status: DC
  Administered 2022-05-12: 30 mg via SUBCUTANEOUS
  Filled 2022-05-11: qty 0.4

## 2022-05-11 MED ORDER — ENSURE ENLIVE PO LIQD
237.0000 mL | Freq: Two times a day (BID) | ORAL | Status: DC
  Administered 2022-05-11 – 2022-05-13 (×6): 237 mL via ORAL

## 2022-05-11 MED ORDER — CEFAZOLIN SODIUM-DEXTROSE 2-4 GM/100ML-% IV SOLN
2.0000 g | Freq: Three times a day (TID) | INTRAVENOUS | Status: AC
  Administered 2022-05-11 (×2): 2 g via INTRAVENOUS
  Filled 2022-05-11 (×2): qty 100

## 2022-05-11 NOTE — Progress Notes (Signed)
PHARMACY NOTE:  RENAL DOSAGE ADJUSTMENT  Renal Function:  Estimated Creatinine Clearance: 28.9 mL/min (A) (by C-G formula based on SCr of 2.1 mg/dL (H)).   Plan: LMWH 40 qday decreased to LMWH 30 qday for CrCl < 30 ml/min  Herby Abraham, Pharm.D Use secure chat for questions 05/11/2022 1:58 PM

## 2022-05-11 NOTE — Progress Notes (Signed)
PROGRESS NOTE  Timothy Spencer  DOB: 1941-04-25  PCP: Merri Brunette, MD YSA:[id number]  DOA: 05/09/2022  LOS: 2 days  Hospital Day: 3  Brief narrative: Timothy Spencer is a 81 y.o. male with PMH significant for HTN, HLD, COPD, CAD, CKD stage IIIa, RBBB, hypothyroidism, RLS, BPH. 12/12, patient was brought from home to the ED by EMS for evaluation of left hip pain after a fall. Patient states he was walking downstairs  when he missed a step and fell onto his left hip.  He had severe pain and was unable to stand up on his own.  He did not hit his head or lose consciousness.    In the ED, patient was afebrile, blood pressure elevated 168/76 Labs with WBC count 9.3, hemoglobin 13.3, platelets 123,000, sodium 140, potassium 4.3, bicarb 22, BUN 20, creatinine 1.41, serum glucose 128. Left hip x-ray showed a mildly impacted left intertrochanteric fracture. Portable chest x-ray showed chronically elevated right hemidiaphragm.  No focal consolidation, edema, effusion. Orthopedics, Dr. Blanchie Dessert, was consulted from ED.  Recommended medical admission with plan for potential surgical fixation on 12/13. Admitted to [facility] 12/13, underwent left intramedullary nailing  Subjective: Patient was seen and examined this morning.  Propped up in bed.  Not in distress but confused.  Wife at bedside.  Pain controlled Seen at PT this morning.  SNF recommended  Assessment and plan: Acute impacted left intertrochanteric fracture Secondary to mechanical fall  12/13, underwent left intramedullary nailing Post op recs per orthopedics: WB: WBAT RLE Dressing: keep intact until follow up, change PRN if soiled or saturated. DVT prophylaxis: lovenox starting POD1 x4 weeks Follow up: 2 weeks after surgery for a wound check with Dr. Blanchie Dessert at [facility].   Severe vitamin D deficiency Vitamin D level severely low at 9.8.   Replacement ordered.  Acute urinary retention Patient was retaining  urine more than 600 mL yesterday preop.  Probably due to pain.  Foley catheter was inserted.  No history of prostate enlargement. Will add Flomax 0.4 mg daily to increase the chance of successful voiding trial.  Acute metabolic encephalopathy Patient is mildly delirious today.  Slow to respond and disoriented.  Probably from the effect of surgery, pain meds.  May have underlying subclinical vascular dementia as well..  Continue reorientation effort.  Continue to monitor mental status change   CAD (coronary artery disease) HLD Stable, denies any chest pain.  Does not appear to be on antiplatelet.  Continue rosuvastatin.   COPD (chronic obstructive pulmonary disease)  Stable, no wheezing on admission.   Continue Breztri BID DuoNeb and albuterol as needed   Essential hypertension Continue amlodipine and Flomax.  Losartan on hold. Continue to monitor blood pressure   Chronic kidney disease, stage 3a  Renal function stable.  Continue to monitor.   Hypothyroid Continue Synthroid.   Thrombocytopenia  Mild without obvious bleeding.  Continue to monitor. Recent Labs  Lab 05/09/22 1940 05/10/22 0112  PLT 123* 129*   Anxiety and depression Continue bupropion and sertraline.   Goals of care   Code Status: Full Code    Mobility: Needs PT eval postprocedure  Scheduled Meds:  amLODipine  10 mg Oral Daily   buPROPion  150 mg Oral q morning   cholecalciferol  1,000 Units Oral Daily   enoxaparin  40 mg Subcutaneous Q24H   feeding supplement  237 mL Oral BID BM   latanoprost  1 drop Both Eyes QHS   levothyroxine  112 mcg Oral  Daily   mometasone-formoterol  2 puff Inhalation BID   And   umeclidinium bromide  1 puff Inhalation Daily   rOPINIRole  1 mg Oral TID   rosuvastatin  5 mg Oral Daily   sertraline  100 mg Oral Daily   tamsulosin  0.4 mg Oral Daily   Vitamin D (Ergocalciferol)  50,000 Units Oral Q7 days    PRN meds: acetaminophen, albuterol, HYDROmorphone (DILAUDID)  injection, ipratropium-albuterol, melatonin, methocarbamol (ROBAXIN) IV, ondansetron (ZOFRAN) IV, oxyCODONE, senna-docusate   Infusions:    ceFAZolin (ANCEF) IV 2 g (05/11/22 [id number])   methocarbamol (ROBAXIN) IV 500 mg (05/11/22 0523)     Skin assessment:     Nutritional status:  Body mass index is 23.68 kg/m.  Nutrition Problem: Severe Malnutrition Etiology: chronic illness (COPD, CAD, CKD) Signs/Symptoms: severe fat depletion, severe muscle depletion     Diet:  Diet Order             Diet regular Room service appropriate? Yes; Fluid consistency: Thin  Diet effective now                   DVT prophylaxis:  enoxaparin (LOVENOX) injection 40 mg Start: 05/11/22 0900 SCDs Start: 05/09/22 2136   Antimicrobials: None Fluid: Not on IV fluid Consultants: Orthopedics Family Communication: No family at bedside  Status is: Inpatient  Continue in-hospital care because: POD 1.  PT eval obtained, SNF recommended Level of care: Med-Surg   Dispo: The patient is from: Home              Anticipated d/c is to: Pending clinical course              Patient currently is not medically stable to d/c.   Difficult to place patient No    Antimicrobials: Anti-infectives (From admission, onward)    Start     Dose/Rate Route Frequency Ordered Stop   05/11/22 0630  ceFAZolin (ANCEF) IVPB 2g/100 mL premix        2 g 200 mL/hr over 30 Minutes Intravenous Every 8 hours 05/11/22 0625 05/11/22 2229   05/10/22 1718  vancomycin (VANCOCIN) powder  Status:  Discontinued          As needed 05/10/22 1718 05/10/22 1935   05/10/22 1200  ceFAZolin (ANCEF) IVPB 2g/100 mL premix        2 g 200 mL/hr over 30 Minutes Intravenous On call to O.R. 05/10/22 1114 05/10/22 1653       Objective: Vitals:   05/11/22 0700 05/11/22 0924  BP:  (!) 166/49  Pulse:  100  Resp:  18  Temp:  98.4 F (36.9 C)  SpO2: 94% 96%    Intake/Output Summary (Last 24 hours) at 05/11/2022 1234 Last data filed at  05/11/2022 1000 Gross per 24 hour  Intake 1388.91 ml  Output 1600 ml  Net -211.09 ml   Filed Weights   05/09/22 2211 05/10/22 1133  Weight: 77.8 kg 77 kg   Weight change: -0.792 kg Body mass index is 23.68 kg/m.   Physical Exam: General exam: Pleasant, elderly Caucasian male.  Not in pain this morning.  Foley catheter in place Skin: No rashes, lesions or ulcers. HEENT: Atraumatic, normocephalic, no obvious bleeding Lungs: Clear to auscultation bilaterally. CVS: Regular rate and rhythm, no murmur. GI/Abd soft, nontender, nondistended, bowel sounds present. CNS: Alert, awake, oriented to place.  Slow to respond.   Psychiatry: Frustrated because of pain Extremities: No pedal edema, no calf tenderness  Data Review: I have  personally reviewed the laboratory data and studies available.  F/u labs ordered Unresulted Labs (From admission, onward)     Start     Ordered   05/11/22 0626  CBC  Once,   R       Question:  Specimen collection method  Answer:  Lab=Lab collect   05/11/22 [id number]            Signed, Terrilee Croak, MD [facility] 05/11/2022

## 2022-05-11 NOTE — NC FL2 (Signed)
[place] MEDICAID [facility] LEVEL OF CARE FORM     IDENTIFICATION  Patient Name: Timothy Spencer Birthdate: Jan 17, 1941 Sex: male Admission Date (Current Location): 05/09/2022  [facility] and [place] Number:  Producer, television/film/video and Address:  [facility],  [address] [place], [place] [address]      Provider Number: [id number]  Attending Physician Name and Address:  Lorin Glass, MD  Relative Name and Phone Number:  Tyr Franca (spouse) Ph: [phone]    Current Level of Care: Hospital Recommended Level of Care: Skilled Nursing Facility Prior Approval Number:    Date Approved/Denied:   PASRR Number: [id number]  Discharge Plan: SNF    Current Diagnoses: Patient Active Problem List   Diagnosis Date Noted   Protein-calorie malnutrition, severe 05/10/2022   Closed intertrochanteric fracture of left femur, initial encounter (HCC) 05/09/2022   Thrombocytopenia (HCC) 05/09/2022   Chronic kidney disease, stage 3a (HCC) 05/09/2022   Anxiety and depression    Alpha-1-antitrypsin 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 04/11/2017   Lung nodule < 6cm on CT 03/06/2017   COPD (chronic obstructive pulmonary disease) (HCC) 08/17/2015   Personal history of tobacco use, presenting hazards to health 06/28/2015   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   Essential hypertension 06/18/2015   CAD in native artery    Exertional dyspnea 11/21/2014   Right bundle branch block 07/17/2014   First degree AV block 07/17/2014   CAD (coronary artery disease) 07/17/2014   Fatigue 03/18/2013   Hypothyroid 03/18/2013   Hyperlipidemia with target LDL less than 70 03/18/2013    Orientation RESPIRATION BLADDER Height & Weight     Self, Time, Situation, Place  Normal Continent Weight: 169 lb 12.1 oz (77 kg) Height:  5\' 11"  (180.3 cm)  BEHAVIORAL SYMPTOMS/MOOD NEUROLOGICAL BOWEL NUTRITION STATUS     (N/A) Continent Diet (Regular diet)  AMBULATORY STATUS COMMUNICATION OF NEEDS Skin   Extensive Assist Verbally  Other (Comment), Surgical wounds (Ecchymosis: scattered)                       Personal Care Assistance Level of Assistance  Bathing, Feeding, Dressing Bathing Assistance: Maximum assistance Feeding assistance: Independent Dressing Assistance: Maximum assistance     Functional Limitations Info  Sight, Hearing, Speech Sight Info: Impaired Hearing Info: Impaired Speech Info: Adequate    SPECIAL CARE FACTORS FREQUENCY  PT (By licensed PT), OT (By licensed OT)     PT Frequency: 5x's/week OT Frequency: 5x's/week            Contractures Contractures Info: Not present    Additional Factors Info  Code Status, Allergies, Psychotropic Code Status Info: Full Allergies Info: Hydrochlorothiazide W-spironolactone Psychotropic Info: Wellbutrin, Zoloft         Current Medications (05/11/2022):  This is the current hospital active medication list Current Facility-Administered Medications  Medication Dose Route Frequency Provider Last Rate Last Admin   acetaminophen (TYLENOL) tablet 650 mg  650 mg Oral Q6H PRN 05/13/2022, MD   650 mg at 05/11/22 0921   albuterol (PROVENTIL) (2.5 MG/3ML) 0.083% nebulizer solution 2.5 mg  2.5 mg Nebulization Q6H PRN 05/13/22, MD       amLODipine (NORVASC) tablet 10 mg  10 mg Oral Daily Joen Laura, MD   10 mg at 05/11/22 0925   buPROPion (WELLBUTRIN XL) 24 hr tablet 150 mg  150 mg Oral q morning 05/13/22, MD   150 mg at 05/11/22 0924   ceFAZolin (ANCEF) IVPB 2g/100 mL premix  2  g Intravenous Q8H Willaim Sheng, MD 200 mL/hr at 05/11/22 0651 2 g at 05/11/22 0651   cholecalciferol (VITAMIN D3) 25 MCG (1000 UNIT) tablet 1,000 Units  1,000 Units Oral Daily Willaim Sheng, MD   1,000 Units at 05/11/22 0924   [START ON 05/12/2022] enoxaparin (LOVENOX) injection 30 mg  30 mg Subcutaneous Q24H Leodis Sias T, RPH       feeding supplement (ENSURE ENLIVE / ENSURE PLUS) liquid 237 mL  237 mL Oral BID BM  Dahal, Marlowe Aschoff, MD   237 mL at 05/11/22 0924   HYDROmorphone (DILAUDID) injection 0.5 mg  0.5 mg Intravenous Q2H PRN Willaim Sheng, MD   0.5 mg at 05/10/22 2201   ipratropium-albuterol (DUONEB) 0.5-2.5 (3) MG/3ML nebulizer solution 3 mL  3 mL Nebulization Q4H PRN Willaim Sheng, MD       latanoprost (XALATAN) 0.005 % ophthalmic solution 1 drop  1 drop Both Eyes QHS Willaim Sheng, MD       levothyroxine (SYNTHROID) tablet 112 mcg  112 mcg Oral Daily Willaim Sheng, MD   112 mcg at 05/11/22 0419   melatonin tablet 5 mg  5 mg Oral QHS PRN Raenette Rover, NP       methocarbamol (ROBAXIN) 500 mg in dextrose 5 % 50 mL IVPB  500 mg Intravenous Q8H PRN Terrilee Croak, MD   Stopped at 05/11/22 0553   mometasone-formoterol (DULERA) 100-5 MCG/ACT inhaler 2 puff  2 puff Inhalation BID Willaim Sheng, MD   2 puff at 05/11/22 0808   And   umeclidinium bromide (INCRUSE ELLIPTA) 62.5 MCG/ACT 1 puff  1 puff Inhalation Daily Willaim Sheng, MD   1 puff at 05/11/22 0808   ondansetron (ZOFRAN) injection 4 mg  4 mg Intravenous Q6H PRN Willaim Sheng, MD   4 mg at 05/10/22 0436   oxyCODONE (Oxy IR/ROXICODONE) immediate release tablet 2.5-5 mg  2.5-5 mg Oral Q4H PRN Willaim Sheng, MD   5 mg at 05/11/22 [id number]   rOPINIRole (REQUIP) tablet 1 mg  1 mg Oral TID Willaim Sheng, MD   1 mg at 05/11/22 [id number]   rosuvastatin (CRESTOR) tablet 5 mg  5 mg Oral Daily Willaim Sheng, MD   5 mg at 05/11/22 [id number]   senna-docusate (Senokot-S) tablet 1 tablet  1 tablet Oral QHS PRN Willaim Sheng, MD       sertraline (ZOLOFT) tablet 100 mg  100 mg Oral Daily Willaim Sheng, MD   100 mg at 05/11/22 [id number]   tamsulosin (FLOMAX) capsule 0.4 mg  0.4 mg Oral Daily Willaim Sheng, MD   0.4 mg at 05/11/22 [id number]   Vitamin D (Ergocalciferol) (DRISDOL) 1.25 MG (50000 UNIT) capsule 50,000 Units  50,000 Units Oral Q7 days Willaim Sheng, MD         Discharge  Medications: Please see discharge summary for a list of discharge medications.  Relevant Imaging Results:  Relevant Lab Results:   Additional Information SSN: [id number]  Sherie Don, LCSW

## 2022-05-11 NOTE — Progress Notes (Signed)
     Subjective: Patient reports pain as mild. Looks more comfortable this morning post op. Denies distal n/t.   Objective:   VITALS:   Vitals:   05/10/22 1951 05/10/22 2053 05/11/22 0026 05/11/22 0606  BP: (!) 148/55  (!) 132/57 (!) 132/48  Pulse: 97  (!) 104 100  Resp: 20  20 17   Temp: 98.2 F (36.8 C)  98.1 F (36.7 C) 98.3 F (36.8 C)  TempSrc: Oral     SpO2: 97% 93% 96% 94%  Weight:      Height:        Sensation intact distally Intact pulses distally Dorsiflexion/Plantar flexion intact Incision: dressing C/D/I Compartment soft   Lab Results  Component Value Date   WBC 10.4 05/10/2022   HGB 12.8 (L) 05/10/2022   HCT 37.8 (L) 05/10/2022   MCV 95.2 05/10/2022   PLT 129 (L) 05/10/2022   BMET    Component Value Date/Time   NA 140 05/10/2022 0112   K 4.5 05/10/2022 0112   CL 110 05/10/2022 0112   CO2 21 (L) 05/10/2022 0112   GLUCOSE 124 (H) 05/10/2022 0112   BUN 19 05/10/2022 0112   CREATININE 1.60 (H) 05/10/2022 0112   CREATININE 1.44 (H) 06/19/2016 1547   CALCIUM 9.1 05/10/2022 0112   GFRNONAA 43 (L) 05/10/2022 0112   Xray: post op xrays show intertroch fracture s/p ORIF with hardware in good position no adverse features  Assessment/Plan: 1 Day Post-Op   Principal Problem:   Closed intertrochanteric fracture of left femur, initial encounter (HCC) Active Problems:   Hypothyroid   Hyperlipidemia with target LDL less than 70   CAD (coronary artery disease)   Essential hypertension   COPD (chronic obstructive pulmonary disease) (HCC)   Anxiety and depression   Thrombocytopenia (HCC)   Chronic kidney disease, stage 3a (HCC)   Protein-calorie malnutrition, severe  S/p L intertroch femur fracture ORIF 12/13  Post op recs: WB: WBAT RLE Abx: ancef x23 hours post op Imaging: PACU xrays Dressing: keep intact until follow up, change PRN if soiled or saturated. DVT prophylaxis: lovenox starting POD1 x4 weeks Follow up: 2 weeks after surgery for a  wound check with Dr. 1/14 at [facility].  Address: [address], [place], [place]  Office Phone: [phone]   [phone] 05/11/2022, 6:43 AM   05/13/2022, MD  Contact information:   [phone] 7am-5pm epic message Dr. GLOVFIEP, or call office for patient follow up: [phone] After hours and holidays please check [email or web address] for group call information for [facility]

## 2022-05-11 NOTE — TOC Initial Note (Signed)
Transition of Care [facility]) - Initial/Assessment Note   Patient Details  Name: Timothy Spencer MRN: [id number] Date of Birth: [birth year]  Transition of Care [facility]) CM/SW Contact:    Sherie Don, LCSW Phone Number: 05/11/2022, 2:08 PM  Clinical Narrative: PT evaluation recommended SNF. CSW met with patient regarding recommendations. Patient reported he would prefer to discharge home with home health, but after speaking with patient's wife she cannot manage patient's current level of care at home so patient will need SNF. Wife is requesting [facility] as first choice.  FL2 done; PTAR scheduled. Initial referral faxed out. TOC awaiting bed offers.  Expected Discharge Plan: Skilled Nursing Facility Barriers to Discharge: Continued Medical Work up, Ship broker, SNF Pending bed offer  Patient Goals and CMS Choice Patient states their goals for this hospitalization and ongoing recovery are:: Get better CMS Medicare.gov Compare Post Acute Care list provided to:: Patient Represenative (must comment) Choice offered to / list presented to : Spouse, Patient  Expected Discharge Plan and Services Expected Discharge Plan: [place] In-house Referral: Clinical Social Work Post Acute Care Choice: [place] Living arrangements for the past 2 months: Apartment             DME Arranged: N/A DME Agency: NA  Prior Living Arrangements/Services Living arrangements for the past 2 months: Apartment Lives with:: Spouse Patient language and need for interpreter reviewed:: Yes Do you feel safe going back to the place where you live?: Yes      Need for Family Participation in Patient Care: Yes (Comment) Care giver support system in place?: Yes (comment) Criminal Activity/Legal Involvement Pertinent to Current Situation/Hospitalization: No - Comment as needed  Activities of Daily Living Home Assistive Devices/Equipment: None ADL Screening (condition at time of  admission) Patient's cognitive ability adequate to safely complete daily activities?: Yes Is the patient deaf or have difficulty hearing?: No Does the patient have difficulty seeing, even when wearing glasses/contacts?: No Does the patient have difficulty concentrating, remembering, or making decisions?: Yes Patient able to express need for assistance with ADLs?: Yes Does the patient have difficulty dressing or bathing?: Yes Independently performs ADLs?: No Communication: Independent Dressing (OT): Needs assistance Is this a change from baseline?: Pre-admission baseline Grooming: Needs assistance Is this a change from baseline?: Pre-admission baseline Feeding: Independent Bathing: Needs assistance Is this a change from baseline?: Pre-admission baseline Toileting: Needs assistance Is this a change from baseline?: Pre-admission baseline In/Out Bed: Needs assistance Is this a change from baseline?: Pre-admission baseline Walks in Home: Needs assistance Is this a change from baseline?: Pre-admission baseline Does the patient have difficulty walking or climbing stairs?: Yes Weakness of Legs: Left Weakness of Arms/Hands: None  Permission Sought/Granted Permission sought to share information with : Facility Art therapist granted to share information with : Yes, Verbal Permission Granted Permission granted to share info w AGENCY: SNFs  Emotional Assessment Appearance:: Appears stated age Attitude/Demeanor/Rapport: Engaged Affect (typically observed): Accepting Orientation: : Oriented to Self, Oriented to Place, Oriented to  Time, Oriented to Situation Alcohol / Substance Use: Not Applicable  Admission diagnosis:  Closed intertrochanteric fracture of left femur, initial encounter [facility]) [S72.142A] Patient Active Problem List   Diagnosis Date Noted   Protein-calorie malnutrition, severe 05/10/2022   Closed intertrochanteric fracture of left femur, initial encounter  ([place]) 05/09/2022   Thrombocytopenia ([place]) 05/09/2022   Chronic kidney disease, stage 3a ([place]) 05/09/2022   Anxiety and depression    Alpha-1-antitrypsin 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 04/11/2017   Lung nodule <  6cm on CT 03/06/2017   COPD (chronic obstructive pulmonary disease) ([place]) 08/17/2015   Personal history of tobacco use, presenting hazards to health 06/28/2015   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   Essential hypertension 06/18/2015   CAD in native artery    Exertional dyspnea 11/21/2014   Right bundle branch block 07/17/2014   First degree AV block 07/17/2014   CAD (coronary artery disease) 07/17/2014   Fatigue 03/18/2013   Hypothyroid 03/18/2013   Hyperlipidemia with target LDL less than 70 03/18/2013   PCP:  Deland Pretty, MD Pharmacy:   [facility] [place], [place] [address] Phone: [phone] Fax: [phone]  [facility] - [place], [place] - [address] [place] [address] Phone: [phone] Fax: [phone]  [facility] - [place], [place] - [address] [place] [address] Phone: [phone] Fax: [phone]  Social Determinants of Health (SDOH) Interventions    Readmission Risk Interventions     No data to display

## 2022-05-11 NOTE — Evaluation (Signed)
Physical Therapy Evaluation Patient Details Name: Timothy Spencer MRN: [id number] DOB: May 29, 1941 Today's Date: 05/11/2022  History of Present Illness  Pt s/p fall with L hip fx and now s/p IM nailing.  Pt with hx of COPD, CAD, macular degeneration, and peripheral neuropathy.  Clinical Impression  Pt admitted as above and presenting with functional mobility limitations 2* decreased L LE strength/ROM, post op pain, premorbid deconditioning with noted SOB with minimal exertion, and limited safety awareness.  Pt would benefit from follow up rehab at SNF level to maximize IND and safety prior to return home with limited assist.     Recommendations for follow up therapy are one component of a multi-disciplinary discharge planning process, led by the attending physician.  Recommendations may be updated based on patient status, additional functional criteria and insurance authorization.  Follow Up Recommendations Skilled nursing-short term rehab (<3 hours/day) Can patient physically be transported by private vehicle: No    Assistance Recommended at Discharge Frequent or constant Supervision/Assistance  Patient can return home with the following  Two people to help with walking and/or transfers;A lot of help with bathing/dressing/bathroom;Assistance with cooking/housework;Assist for transportation;Help with stairs or ramp for entrance    Equipment Recommendations None recommended by PT  Recommendations for Other Services       Functional Status Assessment Patient has had a recent decline in their functional status and demonstrates the ability to make significant improvements in function in a reasonable and predictable amount of time.     Precautions / Restrictions Precautions Precautions: Fall Restrictions Weight Bearing Restrictions: No Other Position/Activity Restrictions: WBAT      Mobility  Bed Mobility Overal bed mobility: Needs Assistance Bed Mobility: Supine to Sit, Sit to  Supine     Supine to sit: +2 for safety/equipment, +2 for physical assistance, Mod assist, Max assist Sit to supine: Mod assist, +2 for physical assistance, +2 for safety/equipment   General bed mobility comments: Increased time with cues for sequence and technique.  Physical assist to manage L LE and to control trunk    Transfers Overall transfer level: Needs assistance Equipment used: Rolling walker (2 wheels) Transfers: Sit to/from Stand Sit to Stand: Mod assist, +2 physical assistance, +2 safety/equipment, From elevated surface           General transfer comment: cues for LE management and use of UEs to self assist.  Physical assist to bring wt up and fwd and to balance in standing with RW.  Pt sit<>stand x 2    Ambulation/Gait Ambulation/Gait assistance: Min assist, Mod assist, +2 physical assistance, +2 safety/equipment Gait Distance (Feet): 3 Feet Assistive device: Rolling walker (2 wheels) Gait Pattern/deviations: Step-to pattern, Decreased step length - right, Decreased step length - left, Shuffle, Trunk flexed       General Gait Details: Pt unable to advance L foot in standing but on second attempt able to heel/tow up side of bed  Stairs            Wheelchair Mobility    Modified Rankin (Stroke Patients Only)       Balance Overall balance assessment: Needs assistance Sitting-balance support: Feet supported, Bilateral upper extremity supported Sitting balance-Leahy Scale: Poor     Standing balance support: Bilateral upper extremity supported Standing balance-Leahy Scale: Poor                               Pertinent Vitals/Pain Pain Assessment Pain Assessment: Faces Faces Pain  Scale: Hurts little more Pain Location: L hip Pain Descriptors / Indicators: Aching, Guarding, Sore Pain Intervention(s): Limited activity within patient's tolerance, Monitored during session, Premedicated before session    Home Living Family/patient expects  to be discharged to:: Skilled nursing facility Living Arrangements: Spouse/significant other Available Help at Discharge: Family Type of Home: House Home Access: Stairs to enter Entrance Stairs-Rails: Right Entrance Stairs-Number of Steps: 3            Prior Function Prior Level of Function : Independent/Modified Independent             Mobility Comments: Used RW out of home       Hand Dominance        Extremity/Trunk Assessment   Upper Extremity Assessment Upper Extremity Assessment: Generalized weakness    Lower Extremity Assessment Lower Extremity Assessment: LLE deficits/detail;Generalized weakness    Cervical / Trunk Assessment Cervical / Trunk Assessment: Normal  Communication   Communication: No difficulties  Cognition Arousal/Alertness: Awake/alert Behavior During Therapy: Impulsive, Anxious Overall Cognitive Status: Impaired/Different from baseline Area of Impairment: Attention, Safety/judgement, Following commands, Memory                     Memory: Decreased short-term memory Following Commands: Follows one step commands inconsistently Safety/Judgement: Decreased awareness of safety     General Comments: Pt perseverating on catheter and need for removal        General Comments      Exercises General Exercises - Lower Extremity Ankle Circles/Pumps: AROM, Both, 15 reps, Supine   Assessment/Plan    PT Assessment Patient needs continued PT services  PT Problem List Decreased strength;Decreased activity tolerance;Decreased range of motion;Decreased balance;Decreased mobility;Decreased knowledge of use of DME;Pain;Decreased safety awareness       PT Treatment Interventions DME instruction;Gait training;Functional mobility training;Therapeutic activities;Therapeutic exercise;Balance training;Patient/family education    PT Goals (Current goals can be found in the Care Plan section)  Acute Rehab PT Goals Patient Stated Goal: Regain  IND PT Goal Formulation: With patient Time For Goal Achievement: 05/25/22 Potential to Achieve Goals: Fair    Frequency Min 3X/week     Co-evaluation               AM-PAC PT "6 Clicks" Mobility  Outcome Measure Help needed turning from your back to your side while in a flat bed without using bedrails?: A Lot Help needed moving from lying on your back to sitting on the side of a flat bed without using bedrails?: A Lot Help needed moving to and from a bed to a chair (including a wheelchair)?: A Lot Help needed standing up from a chair using your arms (e.g., wheelchair or bedside chair)?: A Lot Help needed to walk in hospital room?: Total Help needed climbing 3-5 steps with a railing? : Total 6 Click Score: 10    End of Session Equipment Utilized During Treatment: Gait belt Activity Tolerance: Patient limited by fatigue;Other (comment) (SOB with exertion) Patient left: in bed;with call bell/phone within reach;with bed alarm set;with family/visitor present;with nursing/sitter in room Nurse Communication: Mobility status PT Visit Diagnosis: Difficulty in walking, not elsewhere classified (R26.2);Muscle weakness (generalized) (M62.81)    Time: 4010-2725 PT Time Calculation (min) (ACUTE ONLY): 22 min   Charges:   PT Evaluation $PT Eval Low Complexity: 1 Low          [name] PT Acute Rehabilitation Services Pager [phone] Office [phone]   Larya Charpentier 05/11/2022, 11:40 AM

## 2022-05-11 NOTE — Plan of Care (Signed)

## 2022-05-12 ENCOUNTER — Encounter (HOSPITAL_COMMUNITY): Payer: Self-pay | Admitting: Orthopedic Surgery

## 2022-05-12 DIAGNOSIS — S72142A Displaced intertrochanteric fracture of left femur, initial encounter for closed fracture: Secondary | ICD-10-CM | POA: Diagnosis not present

## 2022-05-12 LAB — CBC
HCT: 26.8 % — ABNORMAL LOW (ref 39.0–52.0)
Hemoglobin: 9.1 g/dL — ABNORMAL LOW (ref 13.0–17.0)
MCH: 32.7 pg (ref 26.0–34.0)
MCHC: 34 g/dL (ref 30.0–36.0)
MCV: 96.4 fL (ref 80.0–100.0)
Platelets: 122 10*3/uL — ABNORMAL LOW (ref 150–400)
RBC: 2.78 MIL/uL — ABNORMAL LOW (ref 4.22–5.81)
RDW: 14.2 % (ref 11.5–15.5)
WBC: 9.5 10*3/uL (ref 4.0–10.5)
nRBC: 0 % (ref 0.0–0.2)

## 2022-05-12 LAB — BASIC METABOLIC PANEL
Anion gap: 8 (ref 5–15)
BUN: 38 mg/dL — ABNORMAL HIGH (ref 8–23)
CO2: 20 mmol/L — ABNORMAL LOW (ref 22–32)
Calcium: 8.5 mg/dL — ABNORMAL LOW (ref 8.9–10.3)
Chloride: 107 mmol/L (ref 98–111)
Creatinine, Ser: 1.45 mg/dL — ABNORMAL HIGH (ref 0.61–1.24)
GFR, Estimated: 48 mL/min — ABNORMAL LOW (ref >60)
Glucose, Bld: 107 mg/dL — ABNORMAL HIGH (ref 70–99)
Potassium: 4.4 mmol/L (ref 3.5–5.1)
Sodium: 135 mmol/L (ref 135–145)

## 2022-05-12 MED ORDER — ENOXAPARIN SODIUM 40 MG/0.4ML IJ SOSY
40.0000 mg | PREFILLED_SYRINGE | INTRAMUSCULAR | Status: DC
  Administered 2022-05-13: 40 mg via SUBCUTANEOUS
  Filled 2022-05-12: qty 0.4

## 2022-05-12 NOTE — Progress Notes (Signed)
PROGRESS NOTE  Timothy Spencer  DOB: 02/06/1941  PCP: Merri Brunette, MD ZOX:[id number]  DOA: 05/09/2022  LOS: 3 days  Hospital Day: 4  Brief narrative: Timothy Spencer is a 81 y.o. male with PMH significant for HTN, HLD, COPD, CAD, CKD stage IIIa, RBBB, hypothyroidism, RLS, BPH. 12/12, patient was brought from home to the ED by EMS for evaluation of left hip pain after a fall. Patient states he was walking downstairs  when he missed a step and fell onto his left hip.  He had severe pain and was unable to stand up on his own.  He did not hit his head or lose consciousness.    In the ED, patient was afebrile, blood pressure elevated 168/76 Labs with WBC count 9.3, hemoglobin 13.3, platelets 123,000, sodium 140, potassium 4.3, bicarb 22, BUN 20, creatinine 1.41, serum glucose 128. Left hip x-ray showed a mildly impacted left intertrochanteric fracture. Portable chest x-ray showed chronically elevated right hemidiaphragm.  No focal consolidation, edema, effusion. Orthopedics, Dr. Blanchie Dessert, was consulted from ED.  Recommended medical admission with plan for potential surgical fixation on 12/13. Admitted to [facility] 12/13, underwent left intramedullary nailing  Subjective: Patient was seen and examined this morning. Sitting up in recliner.  Not in distress.  Pain improved. Labs this morning with stable hemoglobin, improving creatinine. Pending SNF  Assessment and plan: Acute impacted left intertrochanteric fracture Secondary to mechanical fall  12/13, underwent left intramedullary nailing Post op recs per orthopedics: WB: WBAT RLE Dressing: keep intact until follow up, change PRN if soiled or saturated. DVT prophylaxis: lovenox 4 weeks Follow up: 2 weeks after surgery for a wound check with Dr. Blanchie Dessert at [facility].   Severe vitamin D deficiency Vitamin D level severely low at 9.8.   Replacement ordered.  Acute urinary retention Patient was retaining urine more  than 600 mL preop.  Probably due to pain.  Foley catheter was inserted.  No history of prostate enlargement. Flomax 0.4 mg daily was added.  Remove Foley catheter for voiding trial today.  Acute metabolic encephalopathy Patient is mildly delirious today.  Slow to respond and disoriented.  Probably from the effect of surgery, pain meds.  May have underlying subclinical vascular dementia as well..  Continue reorientation effort.  Continue to monitor mental status change  AKI on CKD 3a Creatinine at baseline less than 1.5. Postoperative creatinine was elevated up to 2.1.  Improved with IV fluid.  1.45 this morning. Recent Labs    01/29/22 1844 05/09/22 1940 05/10/22 0112 05/11/22 1203 05/12/22 0922  BUN 22 20 19  41* 38*  CREATININE 1.81* 1.41* 1.60* 2.10* 1.45*    CAD (coronary artery disease) HLD Stable, denies any chest pain.  Does not appear to be on antiplatelet.  Continue rosuvastatin.   COPD (chronic obstructive pulmonary disease)  Stable, no wheezing on admission.   Continue Breztri BID DuoNeb and albuterol as needed   Essential hypertension Continue amlodipine and Flomax.  Losartan on hold. Continue to monitor blood pressure   Chronic kidney disease, stage 3a  Renal function stable.  Continue to monitor.   Hypothyroid Continue Synthroid.   Thrombocytopenia  Mild without obvious bleeding.  Continue to monitor. Recent Labs  Lab 05/09/22 1940 05/10/22 0112 05/11/22 1203 05/12/22 0922  PLT 123* 129* 116* 122*   Anxiety and depression Continue bupropion and sertraline.   Goals of care   Code Status: Full Code    Mobility: PT eval obtained.  SNF recommended  Scheduled Meds:  amLODipine  10 mg Oral Daily   buPROPion  150 mg Oral q morning   cholecalciferol  1,000 Units Oral Daily   [START ON 05/13/2022] enoxaparin  40 mg Subcutaneous Q24H   feeding supplement  237 mL Oral BID BM   latanoprost  1 drop Both Eyes QHS   levothyroxine  112 mcg Oral Daily    mometasone-formoterol  2 puff Inhalation BID   And   umeclidinium bromide  1 puff Inhalation Daily   rOPINIRole  1 mg Oral TID   rosuvastatin  5 mg Oral Daily   sertraline  100 mg Oral Daily   tamsulosin  0.4 mg Oral Daily   Vitamin D (Ergocalciferol)  50,000 Units Oral Q7 days    PRN meds: acetaminophen, albuterol, HYDROmorphone (DILAUDID) injection, ipratropium-albuterol, melatonin, methocarbamol (ROBAXIN) IV, ondansetron (ZOFRAN) IV, oxyCODONE, senna-docusate   Infusions:   methocarbamol (ROBAXIN) IV Stopped (05/11/22 0553)     Skin assessment:     Nutritional status:  Body mass index is 23.68 kg/m.  Nutrition Problem: Severe Malnutrition Etiology: chronic illness (COPD, CAD, CKD) Signs/Symptoms: severe fat depletion, severe muscle depletion     Diet:  Diet Order             Diet regular Room service appropriate? Yes; Fluid consistency: Thin  Diet effective now                   DVT prophylaxis:  enoxaparin (LOVENOX) injection 40 mg Start: 05/13/22 1000 SCDs Start: 05/09/22 2136   Antimicrobials: None Fluid: Not on IV fluid Consultants: Orthopedics Family Communication: No family at bedside  Status is: Inpatient  Continue in-hospital care because: SNF recommended Level of care: Med-Surg   Dispo: The patient is from: Home              Anticipated d/c is to: SNF              Patient currently is not medically stable to d/c.   Difficult to place patient No    Antimicrobials: Anti-infectives (From admission, onward)    Start     Dose/Rate Route Frequency Ordered Stop   05/11/22 0630  ceFAZolin (ANCEF) IVPB 2g/100 mL premix        2 g 200 mL/hr over 30 Minutes Intravenous Every 8 hours 05/11/22 0625 05/11/22 1520   05/10/22 1718  vancomycin (VANCOCIN) powder  Status:  Discontinued          As needed 05/10/22 1718 05/10/22 1935   05/10/22 1200  ceFAZolin (ANCEF) IVPB 2g/100 mL premix        2 g 200 mL/hr over 30 Minutes Intravenous On call to  O.R. 05/10/22 1114 05/10/22 1653       Objective: Vitals:   05/12/22 0600 05/12/22 1345  BP: 135/60 132/66  Pulse: 95 100  Resp: 17 17  Temp: 98.6 F (37 C) 97.8 F (36.6 C)  SpO2: 100% 93%    Intake/Output Summary (Last 24 hours) at 05/12/2022 1357 Last data filed at 05/12/2022 1100 Gross per 24 hour  Intake 640 ml  Output 1425 ml  Net -785 ml   Filed Weights   05/09/22 2211 05/10/22 1133  Weight: 77.8 kg 77 kg   Weight change:  Body mass index is 23.68 kg/m.   Physical Exam: General exam: Pleasant, elderly Caucasian male.  Not in pain this morning.  Foley catheter in place. Skin: No rashes, lesions or ulcers. HEENT: Atraumatic, normocephalic, no obvious bleeding Lungs: Clear to auscultation bilaterally. CVS:  Regular rate and rhythm, no murmur. GI/Abd soft, nontender, nondistended, bowel sounds present. CNS: Alert, awake, oriented to place.  Slow to respond.   Psychiatry: Mood appropriate Extremities: No pedal edema, no calf tenderness  Data Review: I have personally reviewed the laboratory data and studies available.  F/u labs ordered Unresulted Labs (From admission, onward)    None       Signed, Lorin Glass, MD [facility] 05/12/2022

## 2022-05-12 NOTE — Plan of Care (Signed)
  Problem: Education: Goal: Knowledge of General Education information will improve Description: Including pain rating scale, medication(s)/side effects and non-pharmacologic comfort measures Outcome: Progressing   Problem: Health Behavior/Discharge Planning: Goal: Ability to manage health-related needs will improve Outcome: Progressing   Problem: Activity: Goal: Risk for activity intolerance will decrease Outcome: Progressing   

## 2022-05-12 NOTE — Anesthesia Postprocedure Evaluation (Signed)
Anesthesia Post Note  Patient: Timothy Spencer  Procedure(s) Performed: INTRAMEDULLARY (IM) NAIL FEMORAL (Left: Hip)     Patient location during evaluation: PACU Anesthesia Type: General Level of consciousness: awake and alert Pain management: pain level controlled Vital Signs Assessment: post-procedure vital signs reviewed and stable Respiratory status: spontaneous breathing, nonlabored ventilation, respiratory function stable and patient connected to nasal cannula oxygen Cardiovascular status: blood pressure returned to baseline and stable Postop Assessment: no apparent nausea or vomiting Anesthetic complications: no   No notable events documented.  Last Vitals:  Vitals:   05/11/22 2146 05/12/22 0600  BP: (!) 138/56 135/60  Pulse: 89 95  Resp: 17 17  Temp: 36.6 C 37 C  SpO2: 97% 100%    Last Pain:  Vitals:   05/12/22 0625  TempSrc:   PainSc: 3                  Saveah Bahar S

## 2022-05-12 NOTE — TOC Progression Note (Addendum)
Transition of Care [facility]) - Progression Note   Patient Details  Name: Timothy Spencer MRN: [id number] Date of Birth: 1941-05-01  Transition of [facility]) CM/SW Contact  Ewing Schlein, LCSW Phone Number: 05/12/2022, 3:38 PM  Clinical Narrative: Patient has received multiple bed offers, which CSW reviewed with wife and provided the Medicare star ratings as well as the Medicare nursing home compare website. CSW made multiple calls to wife to discuss bed options, but wife was requesting [facility]. CSW explained that [facility] declined as the facility is full at this time and the patient will not be able to remain in the hospital until a bed becomes available as he will be medically stable before a bed becomes available. Wife repeatedly expressed concerns regarding the quality of the bed offers. CSW encouraged wife to review the Medicare website as the SNFs are assessed at regular intervals. Wife asked if the patient "will be thrown in the streets" if she did not choose a bed. CSW reiterated the patient has bed offers from highly rated facilities based on Medicare's criteria, but that these bed offers are not available indefinitely as beds are continuously being filled.  Wife again asked if the patient can remain in the hospital until next week. CSW explained that once SNFs have made bed offers and the patient is medically stable, wife would need to appeal the discharge but the patient would be financially responsible for the hospital stay if the discharge is supported by [facility].  Wife is now agreeable to accepting a bed offer from [facility]. CSW confirmed bed availability for tomorrow with [facility] in admissions. CSW completed insurance authorization on [employer] portal. Reference ID# is: [id number]. Patient has been approved for 05/13/2022-05/16/2022. CSW updated [facility].  Expected Discharge Plan: Skilled Nursing Facility Barriers to Discharge: Continued Medical Work up, English as a second language teacher, SNF Pending bed  offer  Expected Discharge Plan and Services Expected Discharge Plan: Skilled Nursing Facility In-house Referral: Clinical Social Work Post Acute Care Choice: Skilled Nursing Facility Living arrangements for the past 2 months: Apartment         DME Arranged: N/A DME Agency: NA  Social Determinants of Health (SDOH) Interventions    Readmission Risk Interventions     No data to display

## 2022-05-12 NOTE — Progress Notes (Signed)
PHARMACY NOTE:  RENAL DOSAGE ADJUSTMENT  Renal Function:  Estimated Creatinine Clearance: 41.8 mL/min (A) (by C-G formula based on SCr of 1.45 mg/dL (H)).   Plan: LMWH 30 qday increased to LMWH 40 qday for CrCl > 30 ml/min  Herby Abraham, Pharm.D Use secure chat for questions 05/12/2022 10:40 AM

## 2022-05-12 NOTE — Progress Notes (Signed)
Physical Therapy Treatment Patient Details Name: Timothy Spencer MRN: [id number] DOB: [birth year] Today's Date: 05/12/2022   History of Present Illness Pt s/p fall with L hip fx and now s/p IM nailing.  Pt with hx of COPD, CAD, macular degeneration, and peripheral neuropathy.    PT Comments    Pt fatigued and requesting return to bed.  Pt very cooperative and following cues but limited by anxiety and fear of falling.  Pt requiring significant assist of 2 and increased time for all mobility tasks and would benefit from follow up rehab at SNF level to maximize safety and IND.  Recommendations for follow up therapy are one component of a multi-disciplinary discharge planning process, led by the attending physician.  Recommendations may be updated based on patient status, additional functional criteria and insurance authorization.  Follow Up Recommendations  Skilled nursing-short term rehab (<3 hours/day) Can patient physically be transported by private vehicle: No   Assistance Recommended at Discharge Frequent or constant Supervision/Assistance  Patient can return home with the following Two people to help with walking and/or transfers;A lot of help with bathing/dressing/bathroom;Assistance with cooking/housework;Assist for transportation;Help with stairs or ramp for entrance   Equipment Recommendations  None recommended by PT    Recommendations for Other Services       Precautions / Restrictions Precautions Precautions: Fall Restrictions Weight Bearing Restrictions: No Other Position/Activity Restrictions: WBAT     Mobility  Bed Mobility Overal bed mobility: Needs Assistance Bed Mobility: Sit to Supine       Sit to supine: Mod assist, +2 for physical assistance, +2 for safety/equipment   General bed mobility comments: cues for sequence with physical assist to manage LEs and to control trunk    Transfers Overall transfer level: Needs assistance Equipment used: Rolling  walker (2 wheels) Transfers: Sit to/from Stand, Bed to chair/wheelchair/BSC Sit to Stand: Max assist, +2 physical assistance, +2 safety/equipment   Step pivot transfers: Mod assist, +2 physical assistance, +2 safety/equipment       General transfer comment: cues for LE management and use of UEs to self assist.  Physical assist to block R knee/foot, to bring weight up and fwd and to balance in standing with RW    Ambulation/Gait Ambulation/Gait assistance: Min assist, Mod assist, +2 physical assistance, +2 safety/equipment Gait Distance (Feet): 2 Feet Assistive device: Rolling walker (2 wheels) Gait Pattern/deviations: Step-to pattern, Decreased step length - right, Decreased step length - left, Shuffle, Trunk flexed Gait velocity: decr     General Gait Details: Pt stood with assist from recliner and performed step pvt with RW before stepping back 2 feet to bedside   Stairs             Wheelchair Mobility    Modified Rankin (Stroke Patients Only)       Balance Overall balance assessment: Needs assistance Sitting-balance support: Feet supported, Bilateral upper extremity supported Sitting balance-Leahy Scale: Fair     Standing balance support: Bilateral upper extremity supported Standing balance-Leahy Scale: Poor                              Cognition Arousal/Alertness: Awake/alert Behavior During Therapy: Anxious Overall Cognitive Status: Within Functional Limits for tasks assessed                                 General Comments: AxO x 2 pleasant retired [name]  who lives home with spouse and amb on a walker.  Per chart review, pt was going down some steps when her "missed the last one" and fell on his left side.  Pt following all commands.  Present with increased anxiety/fear of falling.        Exercises      General Comments        Pertinent Vitals/Pain Pain Assessment Pain Assessment: Faces Faces Pain Scale: Hurts  even more Pain Location: L hip Pain Descriptors / Indicators: Aching, Guarding, Sore, Operative site guarding, Grimacing Pain Intervention(s): Limited activity within patient's tolerance, Monitored during session, Premedicated before session    Home Living                          Prior Function            PT Goals (current goals can now be found in the care plan section) Acute Rehab PT Goals Patient Stated Goal: Regain IND PT Goal Formulation: With patient Time For Goal Achievement: 05/25/22 Potential to Achieve Goals: Fair Progress towards PT goals: Progressing toward goals    Frequency    Min 3X/week      PT Plan Current plan remains appropriate    Co-evaluation              AM-PAC PT "6 Clicks" Mobility   Outcome Measure  Help needed turning from your back to your side while in a flat bed without using bedrails?: A Lot Help needed moving from lying on your back to sitting on the side of a flat bed without using bedrails?: A Lot Help needed moving to and from a bed to a chair (including a wheelchair)?: A Lot Help needed standing up from a chair using your arms (e.g., wheelchair or bedside chair)?: A Lot Help needed to walk in hospital room?: Total Help needed climbing 3-5 steps with a railing? : Total 6 Click Score: 10    End of Session Equipment Utilized During Treatment: Gait belt Activity Tolerance: Patient limited by fatigue Patient left: in bed;with call bell/phone within reach;with bed alarm set;with nursing/sitter in room Nurse Communication: Mobility status PT Visit Diagnosis: Difficulty in walking, not elsewhere classified (R26.2);Muscle weakness (generalized) (M62.81)     Time: 6948-5462 PT Time Calculation (min) (ACUTE ONLY): 13 min  Charges:  $Gait Training: 8-22 mins $Therapeutic Activity: 8-22 mins                     Timothy Spencer PT Acute Rehabilitation Services Pager [phone] Office  [phone]    Timothy Spencer 05/12/2022, 5:34 PM

## 2022-05-12 NOTE — Progress Notes (Signed)
Physical Therapy Treatment Patient Details Name: Timothy Spencer MRN: [id number] DOB: January 06, 1941 Today's Date: 05/12/2022   History of Present Illness Pt s/p fall with L hip fx and now s/p IM nailing.  Pt with hx of COPD, CAD, macular degeneration, and peripheral neuropathy.    PT Comments    POD # 2 Pt called to use toilet for a BM.  General transfer comment: Pt stated he felt like he needed to "sit on the toilet" for a BM.  Used STEDY to asisst from recliner to [facility] + 2 asisst with great difficulty rising due to pain/anxiety.  Allowed pt to sit on BSC x 15 min was successful.  Required + 2 asisst to rise from [facility], perform peri care then transfered back to recliner with STEDY flaps down. Positioned in recliner to comfort and applied ICE. Pt will need ST Rehab at SNF to address mobility and functional decline prior to safely returning home.   Recommendations for follow up therapy are one component of a multi-disciplinary discharge planning process, led by the attending physician.  Recommendations may be updated based on patient status, additional functional criteria and insurance authorization.  Follow Up Recommendations  Skilled nursing-short term rehab (<3 hours/day) Can patient physically be transported by private vehicle: No   Assistance Recommended at Discharge Frequent or constant Supervision/Assistance  Patient can return home with the following Two people to help with walking and/or transfers;A lot of help with bathing/dressing/bathroom;Assistance with cooking/housework;Assist for transportation;Help with stairs or ramp for entrance   Equipment Recommendations  None recommended by PT    Recommendations for Other Services       Precautions / Restrictions Precautions Precautions: Fall Restrictions Weight Bearing Restrictions: No Other Position/Activity Restrictions: WBAT     Mobility  Bed Mobility General bed mobility comments: OOB in recliner    Transfers Overall  transfer level: Needs assistance Equipment used: STEDY Transfers: Sit to/from Stand, Bed to chair/wheelchair/BSC Sit to Stand: Max assist, +2 physical assistance, +2 safety/equipment Stand pivot transfers: +2 physical assistance, +2 safety/equipment         General transfer comment: Pt stated he felt like he needed to "sit on the toilet" for a BM.  Used STEDY to asisst from recliner to [facility] + 2 asisst with great difficulty rising due to pain/anxiety.  Allowed pt to sit on BSC x 15 min was successful.  Required + 2 asisst to rise from [facility], perform peri care then transfered back to recliner with STEDY flaps down. Transfer via Lift Equipment: Stedy  Ambulation/Gait               General Gait Details: Transfer only this session due to effort, increased pain and anxiety.   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  Per chart review, pt was going down some steps when her "missed the last one" and fell on his left side.  Pt following all commands.  Present with increased anxiety/fear of falling.  Expressing MAX pain.  Was pre medicated prior to session.        Exercises      General Comments        Pertinent Vitals/Pain Pain Assessment Pain Assessment: Faces Faces Pain Scale: Hurts whole lot Pain Location: L hip Pain Descriptors / Indicators: Aching, Guarding, Sore, Operative site guarding, Grimacing Pain Intervention(s): Monitored during session, Premedicated before session, Repositioned, Ice applied    Home Living                           Prior Function            PT Goals (current goals can now be found in the care plan section) Progress towards PT goals: Progressing toward goals    Frequency    Min 3X/week      PT Plan Current plan remains appropriate    Co-evaluation              AM-PAC PT "6 Clicks" Mobility   Outcome Measure  Help needed turning from your back to your side while in a flat bed without using bedrails?: A Lot Help needed moving from lying on your back to sitting on the side of a flat bed without using bedrails?: A Lot Help needed moving to and from a bed to a chair (including a wheelchair)?: A Lot Help needed standing up from a chair using your arms (e.g., wheelchair or bedside chair)?: A Lot Help needed to walk in hospital room?: Total Help needed climbing 3-5 steps with a railing? : Total 6 Click Score: 10    End of Session Equipment Utilized During Treatment: Gait belt Activity Tolerance: No increased pain;Other (comment) (anxiety) Patient left: in chair;with call bell/phone within reach Nurse Communication: Mobility status PT Visit Diagnosis: Difficulty in walking, not elsewhere classified (R26.2);Muscle weakness (generalized) (M62.81)     Time: 1000-1025 PT Time Calculation (min) (ACUTE ONLY): 25 min  Charges:  $Gait Training: 8-22 mins $Therapeutic Activity: 23-37 mins                     {Zacari Radick  PTA Acute  Rehabilitation Services Office M-F          [phone] Weekend pager [phone]

## 2022-05-12 NOTE — Progress Notes (Addendum)
Physical Therapy Treatment Patient Details Name: Timothy Spencer MRN: [id number] DOB: November 17, 1940 Today's Date: 05/12/2022   History of Present Illness Pt s/p fall with L hip fx and now s/p IM nailing.  Pt with hx of COPD, CAD, macular degeneration, and peripheral neuropathy.    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  Per chart review, pt was going down some steps when her "missed the last one" and fell on his left side.  Pt following all commands.  Present with increased anxiety/fear of falling.  Expressing MAX pain.  Was pre medicated prior to session. Assisted OOB was difficult.  General transfer comment: + 2 side by side asisst and 75% VC's on proper tech to rise from elevated bed onto EVA walker.  Pt present with increased anxiety/fear of falling.  Took 3 attempts to achieve partial upright posture/stance using B platform EVA walker.  Pt was able to take a few side/picot steps to complete 1/4 turn to recliner but was unable to tolerate no more that 25% WBing thru L LE.  Pt required asisst to "slowly" lower to recliner with increased pain during activity. Positioned in recliner to comfort.  ICE applied.   Pt will need ST Rehab at SNF to address mobility and functional decline prior to safely returning home.   Recommendations for follow up therapy are one component of a multi-disciplinary discharge planning process, led by the attending physician.  Recommendations may be updated based on patient status, additional functional criteria and insurance authorization.  Follow Up Recommendations  Skilled nursing-short term rehab (<3 hours/day) Can patient physically be transported by private vehicle: No   Assistance Recommended at Discharge Frequent or constant Supervision/Assistance  Patient can return home with the following Two people to help with walking and/or transfers;A lot of help with  bathing/dressing/bathroom;Assistance with cooking/housework;Assist for transportation;Help with stairs or ramp for entrance   Equipment Recommendations  None recommended by PT    Recommendations for Other Services       Precautions / Restrictions Precautions Precautions: Fall Restrictions Weight Bearing Restrictions: No Other Position/Activity Restrictions: WBAT     Mobility  Bed Mobility Overal bed mobility: Needs Assistance Bed Mobility: Supine to Sit     Supine to sit: +2 for safety/equipment, +2 for physical assistance, Mod assist, Max assist     General bed mobility comments: Increased time with cues for sequence and technique.  Physical assist to manage L LE and bed pad used to complete scooting to EOB.  Increased pain with seated hip flexion and knee flexion.  Allowed increased time sitting EOB for anxiety to decrease.    Transfers Overall transfer level: Needs assistance Equipment used: Bilateral platform walker (EVA walker) Transfers: Sit to/from Stand Sit to Stand: Max assist, +2 physical assistance, +2 safety/equipment           General transfer comment: + 2 side by side asisst and 75% VC's on proper tech to rise from elevated bed onto EVA walker.  Pt present with increased anxiety/fear of falling.  Took 3 attempts to achieve partial upright posture/stance using B platform EVA walker.  Pt was able to take a few side/picot steps to complete 1/4 turn to recliner but was unable to tolerate no more that 25% WBing thru L LE.  Pt required asisst to "slowly" lower to recliner with increased pain during activity.    Ambulation/Gait  General Gait Details: Transfer only this session due to effort, increased pain and anxiety.   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  Per chart review, pt was going down some steps when her "missed the last one" and fell on his left side.  Pt following all commands.  Present with increased anxiety/fear of falling.  Expressing MAX pain.  Was pre medicated prior to session.        Exercises      General Comments        Pertinent Vitals/Pain Pain Assessment Pain Assessment: Faces Faces Pain Scale: Hurts whole lot Pain Location: L hip Pain Descriptors / Indicators: Aching, Guarding, Sore, Operative site guarding, Grimacing Pain Intervention(s): Monitored during session, Premedicated before session, Repositioned, Ice applied    Home Living                          Prior Function            PT Goals (current goals can now be found in the care plan section) Progress towards PT goals: Progressing toward goals    Frequency    Min 3X/week      PT Plan Current plan remains appropriate    Co-evaluation              AM-PAC PT "6 Clicks" Mobility   Outcome Measure  Help needed turning from your back to your side while in a flat bed without using bedrails?: A Lot Help needed moving from lying on your back to sitting on the side of a flat bed without using bedrails?: A Lot Help needed moving to and from a bed to a chair (including a wheelchair)?: A Lot Help needed standing up from a chair using your arms (e.g., wheelchair or bedside chair)?: A Lot Help needed to walk in hospital room?: Total Help needed climbing 3-5 steps with a railing? : Total 6 Click Score: 10    End of Session Equipment Utilized During Treatment: Gait belt Activity Tolerance: No increased pain;Other (comment) (anxiety) Patient left: in chair;with call bell/phone within  reach Nurse Communication: Mobility status PT Visit Diagnosis: Difficulty in walking, not elsewhere classified (R26.2);Muscle weakness (generalized) (M62.81)     Time: 2774-1287 PT Time Calculation (min) (ACUTE ONLY): 27 min  Charges:  $Gait Training: 8-22 mins $Therapeutic Activity: 8-22 mins                     Felecia Shelling  PTA Acute  Rehabilitation Services Office M-F          [phone] Weekend pager [phone]

## 2022-05-13 DIAGNOSIS — G9341 Metabolic encephalopathy: Secondary | ICD-10-CM | POA: Diagnosis not present

## 2022-05-13 DIAGNOSIS — N189 Chronic kidney disease, unspecified: Secondary | ICD-10-CM | POA: Diagnosis not present

## 2022-05-13 DIAGNOSIS — Z4789 Encounter for other orthopedic aftercare: Secondary | ICD-10-CM | POA: Diagnosis not present

## 2022-05-13 DIAGNOSIS — R4182 Altered mental status, unspecified: Secondary | ICD-10-CM | POA: Diagnosis not present

## 2022-05-13 DIAGNOSIS — I129 Hypertensive chronic kidney disease with stage 1 through stage 4 chronic kidney disease, or unspecified chronic kidney disease: Secondary | ICD-10-CM | POA: Diagnosis not present

## 2022-05-13 DIAGNOSIS — S72142D Displaced intertrochanteric fracture of left femur, subsequent encounter for closed fracture with routine healing: Secondary | ICD-10-CM | POA: Diagnosis not present

## 2022-05-13 DIAGNOSIS — E559 Vitamin D deficiency, unspecified: Secondary | ICD-10-CM | POA: Diagnosis not present

## 2022-05-13 DIAGNOSIS — I2510 Atherosclerotic heart disease of native coronary artery without angina pectoris: Secondary | ICD-10-CM | POA: Diagnosis not present

## 2022-05-13 DIAGNOSIS — J449 Chronic obstructive pulmonary disease, unspecified: Secondary | ICD-10-CM | POA: Diagnosis not present

## 2022-05-13 DIAGNOSIS — S72145D Nondisplaced intertrochanteric fracture of left femur, subsequent encounter for closed fracture with routine healing: Secondary | ICD-10-CM | POA: Diagnosis not present

## 2022-05-13 DIAGNOSIS — E039 Hypothyroidism, unspecified: Secondary | ICD-10-CM | POA: Diagnosis not present

## 2022-05-13 DIAGNOSIS — Z7401 Bed confinement status: Secondary | ICD-10-CM | POA: Diagnosis not present

## 2022-05-13 DIAGNOSIS — R338 Other retention of urine: Secondary | ICD-10-CM | POA: Diagnosis not present

## 2022-05-13 DIAGNOSIS — N401 Enlarged prostate with lower urinary tract symptoms: Secondary | ICD-10-CM | POA: Diagnosis not present

## 2022-05-13 DIAGNOSIS — R339 Retention of urine, unspecified: Secondary | ICD-10-CM | POA: Diagnosis not present

## 2022-05-13 DIAGNOSIS — S72142A Displaced intertrochanteric fracture of left femur, initial encounter for closed fracture: Secondary | ICD-10-CM | POA: Diagnosis not present

## 2022-05-13 DIAGNOSIS — E43 Unspecified severe protein-calorie malnutrition: Secondary | ICD-10-CM | POA: Diagnosis not present

## 2022-05-13 DIAGNOSIS — N179 Acute kidney failure, unspecified: Secondary | ICD-10-CM | POA: Diagnosis not present

## 2022-05-13 MED ORDER — ENOXAPARIN SODIUM 40 MG/0.4ML IJ SOSY: 40.0000 mg | PREFILLED_SYRINGE | INTRAMUSCULAR | 0 refills | Status: DC

## 2022-05-13 MED ORDER — ENSURE ENLIVE PO LIQD: 237.0000 mL | Freq: Two times a day (BID) | ORAL | 12 refills | Status: AC

## 2022-05-13 MED ORDER — OXYCODONE HCL 5 MG PO TABS: 2.5000 mg | ORAL_TABLET | ORAL | 0 refills | Status: AC | PRN

## 2022-05-13 MED ORDER — VITAMIN D (ERGOCALCIFEROL) 1.25 MG (50000 UNIT) PO CAPS: 50000.0000 [IU] | ORAL_CAPSULE | ORAL | Status: DC

## 2022-05-13 MED ORDER — VITAMIN D3 25 MCG PO TABS: 1000.0000 [IU] | ORAL_TABLET | Freq: Every day | ORAL | Status: DC

## 2022-05-13 MED ORDER — SENNOSIDES-DOCUSATE SODIUM 8.6-50 MG PO TABS: 1.0000 | ORAL_TABLET | Freq: Every evening | ORAL | Status: AC | PRN

## 2022-05-13 NOTE — Plan of Care (Signed)
Plan of care reviewed and discussed. °

## 2022-05-13 NOTE — Progress Notes (Signed)
Physical Therapy Treatment Patient Details Name: Timothy Spencer MRN: [id number] DOB: [birth year] Today's Date: 05/13/2022   History of Present Illness Pt s/p fall with L hip fx and now s/p IM nailing.  Pt with hx of COPD, CAD, macular degeneration, and peripheral neuropathy.    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  Per chart review, pt was going down some steps when her "missed the last one" and fell on his left side.  Pt following all commands.  Present with increased anxiety/fear of falling. Assisted OOB was difficult.  General transfer comment: 75% VC's on proper hand placement as well as instruction to decrease pt's anxiety.  Assisted from elevated bed to [facility] then back to bed with + 2 assist.  Limited WBing tolerance L LE due to pain/fear.  Increased anxiety with turns. Allowed increased time seated on BSC with NO void results.  Urology arrived to insert Cath, so assisted back to bed.  Pt required + 2 Max Assist.  Positioned to comfort.   Pt will need ST Rehab at SNF to address mobility and functional decline prior to safely returning home.   Recommendations for follow up therapy are one component of a multi-disciplinary discharge planning process, led by the attending physician.  Recommendations may be updated based on patient status, additional functional criteria and insurance authorization.  Follow Up Recommendations  Skilled nursing-short term rehab (<3 hours/day) Can patient physically be transported by private vehicle: No   Assistance Recommended at Discharge Frequent or constant Supervision/Assistance  Patient can return home with the following Two people to help with walking and/or transfers;A lot of help with bathing/dressing/bathroom;Assistance with cooking/housework;Assist for transportation;Help with stairs or ramp for entrance   Equipment Recommendations  None recommended by PT     Recommendations for Other Services       Precautions / Restrictions Precautions Precautions: Fall Restrictions Weight Bearing Restrictions: No Other Position/Activity Restrictions: WBAT     Mobility  Bed Mobility Overal bed mobility: Needs Assistance Bed Mobility: Supine to Sit, Sit to Supine     Supine to sit: +2 for safety/equipment, +2 for physical assistance, Mod assist, Max assist Sit to supine: +2 for physical assistance, Max assist   General bed mobility comments: cues for sequence with physical assist to manage LEs and to control trunk    Transfers Overall transfer level: Needs assistance Equipment used: Rolling walker (2 wheels) Transfers: Sit to/from Stand Sit to Stand: Max assist, +2 physical assistance, +2 safety/equipment Stand pivot transfers: +2 physical assistance, +2 safety/equipment         General transfer comment: 75% VC's on proper hand placement as well as instruction to decrease pt's anxiety.  Assisted from elevated bed to [facility] then back to bed with + 2 assist.  Limited WBing tolerance L LE due to pain/fear.  Increased anxiety with turns.    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  Per chart review, pt was going down some steps when her "missed the last one" and fell on his left side.  Pt following all commands.  Present with increased anxiety/fear of falling.        Exercises      General Comments        Pertinent Vitals/Pain Pain  Assessment Pain Assessment: 0-10 Pain Score: 7  Pain Location: L hip Pain Descriptors / Indicators: Aching, Guarding, Sore, Operative site guarding, Grimacing Pain Intervention(s): Monitored during session, Premedicated before session, Repositioned, Ice applied    Home Living                          Prior Function            PT Goals (current goals can now be found in the care plan section) Progress towards PT goals: Progressing toward goals    Frequency    Min 3X/week      PT Plan Current plan remains appropriate    Co-evaluation              AM-PAC PT "6 Clicks" Mobility   Outcome Measure  Help needed turning from your back to your side while in a flat bed without using bedrails?: A Lot Help needed moving from lying on your back to sitting on the side of a flat bed without using bedrails?: A Lot Help needed moving to and from a bed to a chair (including a wheelchair)?: A Lot Help needed standing up from a chair using your arms (e.g., wheelchair or bedside chair)?: Total Help needed to walk in hospital room?: Total Help needed climbing 3-5 steps with a railing? : Total 6 Click Score: 9    End of Session Equipment Utilized During Treatment: Gait belt Activity Tolerance: Patient limited by fatigue Patient left: in bed;with call bell/phone within reach;with bed alarm set;with nursing/sitter in room Nurse Communication: Mobility status PT Visit Diagnosis: Difficulty in walking, not elsewhere classified (R26.2);Muscle weakness (generalized) (M62.81)     Time: 1607-3710 PT Time Calculation (min) (ACUTE ONLY): 26 min  Charges:  $Therapeutic Activity: 23-37 mins                     Felecia Shelling  PTA Acute  Rehabilitation Services Office M-F          [phone] Weekend pager [phone]

## 2022-05-13 NOTE — Consult Note (Signed)
Urology Consult   Physician requesting consult: Binaya Dahal, MD  Reason for consult: urinary retention, dLorin Glassifficult foley placement  History of Present Illness: Timothy Spencer is a 81 y.o. male with a PMH of HTN, HLD, COPD, CAD, CKD, hypothyroidism, RLS, and BPH who developed post-operative acute urinary retention following left intramedullary femoral nail. A foley catheter was placed pre-operatively for high bladder scan (~600cc) and discontinued yesterday. Since then, patient has been unable to urinate with PVRs >500cc. Attempts by nursing to place catheter were unsuccessful, with noted resistance in the region of the prostate.  Patient denies a history of retention or a known history of BPH. He denies issues with urination at baseline. He also denies having seen a Insurance underwriterUrologist, though it looks like he saw [facility] back in 2009 for BPH for which he was on flomax.   Past Medical History:  Diagnosis Date   Anxiety and depression    BPH (benign prostatic hyperplasia)    CAD (coronary artery disease)    COPD (chronic obstructive pulmonary disease) (HCC)    DOE (dyspnea on exertion)    ED (erectile dysfunction)    Emphysema of lung (HCC)    First degree AV block    GERD (gastroesophageal reflux disease)    Glaucoma    Gout    Gynecomastia    H/O ETOH abuse    Hemochromatosis    Hyperlipidemia    Hypertension    Hypothyroidism    Macular degeneration    OSA (obstructive sleep apnea)    Osteopenia    Peripheral neuropathy    RLS (restless legs syndrome)    Thrombocytopenia (HCC)     Past Surgical History:  Procedure Laterality Date   CARDIAC CATHETERIZATION  03/27/2008   Medical therapy   CARDIAC CATHETERIZATION  06/03/2010   Medical theray and smoking cessation   CARDIAC CATHETERIZATION N/A 11/24/2014   Procedure: Left Heart Cath and Coronary Angiography;  Surgeon: Lennette Biharihomas A Kelly, MD;  Location: [facility];  Service: Cardiovascular;  Laterality: N/A;    CARDIOPULMONARY EXERCISE TEST  02/12/2012   Peak VO2 63% of predicted   FEMUR IM NAIL Left 05/10/2022   Procedure: INTRAMEDULLARY (IM) NAIL FEMORAL;  Surgeon: Joen LauraMarchwiany, Daniel A, MD;  Location: [facility];  Service: Orthopedics;  Laterality: Left;   ORIF FEMUR FRACTURE Right    TRANSTHORACIC ECHOCARDIOGRAM  11/28/2011   EF >55%, normal    Current Hospital Medications:  Home Meds:  No current facility-administered medications on file prior to encounter.   Current Outpatient Medications on File Prior to Encounter  Medication Sig Dispense Refill   acetaminophen (TYLENOL) 500 MG tablet Take 1,000 mg by mouth every 6 (six) hours as needed for moderate pain.     albuterol (VENTOLIN HFA) 108 (90 Base) MCG/ACT inhaler Inhale 2 puffs into the lungs every 6 (six) hours as needed for wheezing or shortness of breath. 6.7 g 11   amLODipine (NORVASC) 10 MG tablet Take 10 mg by mouth daily.     Budeson-Glycopyrrol-Formoterol (BREZTRI AEROSPHERE) 160-9-4.8 MCG/ACT AERO Inhale 2 puffs into the lungs in the morning and at bedtime. 10.7 g 5   buPROPion (WELLBUTRIN XL) 150 MG 24 hr tablet Take 150 mg by mouth every morning.     ipratropium-albuterol (DUONEB) 0.5-2.5 (3) MG/3ML SOLN Take 3 mLs by nebulization every 4 (four) hours as needed. (Patient taking differently: Take 3 mLs by nebulization every 4 (four) hours as needed (wheezing, SOB).) 360 mL 11   latanoprost (XALATAN) 0.005 %  ophthalmic solution Place 1 drop into both eyes at bedtime.     levothyroxine (SYNTHROID) 112 MCG tablet Take 112 mcg by mouth daily.     losartan (COZAAR) 50 MG tablet Take 50 mg by mouth daily.     rOPINIRole (REQUIP) 1 MG tablet Take 1 mg by mouth 3 (three) times daily.     rosuvastatin (CRESTOR) 5 MG tablet Take 5 mg by mouth daily.     sertraline (ZOLOFT) 100 MG tablet Take 100 mg by mouth daily.     tamsulosin (FLOMAX) 0.4 MG CAPS capsule Take 0.4 mg by mouth daily.        Scheduled Meds:  amLODipine  10 mg Oral Daily    buPROPion  150 mg Oral q morning   cholecalciferol  1,000 Units Oral Daily   enoxaparin  40 mg Subcutaneous Q24H   feeding supplement  237 mL Oral BID BM   latanoprost  1 drop Both Eyes QHS   levothyroxine  112 mcg Oral Daily   mometasone-formoterol  2 puff Inhalation BID   And   umeclidinium bromide  1 puff Inhalation Daily   rOPINIRole  1 mg Oral TID   rosuvastatin  5 mg Oral Daily   sertraline  100 mg Oral Daily   tamsulosin  0.4 mg Oral Daily   Vitamin D (Ergocalciferol)  50,000 Units Oral Q7 days   Continuous Infusions:  methocarbamol (ROBAXIN) IV Stopped (05/11/22 0553)   PRN Meds:.acetaminophen, albuterol, HYDROmorphone (DILAUDID) injection, ipratropium-albuterol, melatonin, methocarbamol (ROBAXIN) IV, ondansetron (ZOFRAN) IV, oxyCODONE, senna-docusate  Allergies:  Allergies  Allergen Reactions   Hydrochlorothiazide W-Spironolactone     Other Reaction(s): dizziness, fainting    Family History  Adopted: Yes    Social History:  reports that he quit smoking about 11 years ago. His smoking use included cigarettes. He started smoking about 62 years ago. He has a 100.00 pack-year smoking history. He has never used smokeless tobacco. He reports that he does not drink alcohol and does not use drugs.  ROS: A complete review of systems was performed.  All systems are negative except for pertinent findings as noted.  Physical Exam:  Vital signs in last 24 hours: Temp:  [97.8 F (36.6 C)-98.6 F (37 C)] 98.6 F (37 C) (12/16 0542) Pulse Rate:  [81-100] 81 (12/16 0542) Resp:  [16-17] 16 (12/16 0542) BP: (116-140)/(54-66) 140/54 (12/16 0542) SpO2:  [93 %-98 %] 95 % (12/16 0837) Constitutional:  Alert and oriented, No acute distress Cardiovascular: Regular rate and rhythm, No JVD Respiratory: Normal respiratory effort, Lungs clear bilaterally GI: Abdomen is soft, nontender, nondistended, no abdominal masses GU: foley catheter in place draining clear yellow urine Lymphatic:  No lymphadenopathy Neurologic: Grossly intact, no focal deficits Psychiatric: Normal mood and affect  Laboratory Data:  Recent Labs    05/11/22 1203 05/12/22 0922  WBC 9.6 9.5  HGB 9.0* 9.1*  HCT 26.6* 26.8*  PLT 116* 122*    Recent Labs    05/11/22 1203 05/12/22 0922  NA 135 135  K 4.3 4.4  CL 107 107  GLUCOSE 100* 107*  BUN 41* 38*  CALCIUM 8.3* 8.5*  CREATININE 2.10* 1.45*     No results found for this or any previous visit (from the past 24 hour(s)). Recent Results (from the past 240 hour(s))  Surgical pcr screen     Status: None   Collection Time: 05/10/22 10:24 AM   Specimen: Nasal Mucosa; Nasal Swab  Result Value Ref Range Status   MRSA, PCR  NEGATIVE NEGATIVE Final   Staphylococcus aureus NEGATIVE NEGATIVE Final    Comment: (NOTE) The Xpert SA Assay (FDA approved for NASAL specimens in patients 15 years of age and older), is one component of a comprehensive surveillance program. It is not intended to diagnose infection nor to guide or monitor treatment. Performed at [facility], [address], [place], [place] [address]     Renal Function: Recent Labs    05/09/22 1940 05/10/22 0112 05/11/22 1203 05/12/22 0922  CREATININE 1.41* 1.60* 2.10* 1.45*   Estimated Creatinine Clearance: 41.8 mL/min (A) (by C-G formula based on SCr of 1.45 mg/dL (H)).  Radiologic Imaging: No results found.  I independently reviewed the above imaging studies.   Foley Catheter Placement Note  Indications: 81 y.o. male with BPH and post-operative acute urinary retention. Difficulty with catheter insertion by RN, so Urology consulted for placement  Pre-operative Diagnosis: Urinary retention  Post-operative Diagnosis: Same  Surgeon: Carlus Pavlov, MD  Assistants: None  Procedure Details  Patient was placed in the supine position, prepped with Betadine and draped in the usual sterile fashion.  We injected lidocaine jelly per urethra prior to  the procedure.  We then inserted a 18 [place] coude catheter per urethra which easily passed into the bladder without any resistance at the prostatic urethra.  We achieved return of clear yellow urine and then proceeded to insert 10 mL of sterile water into the Foley balloon.  The catheter was attached to a drainage bag and secured with a StatLock.  Placement of the catheter had return of greater than 500 mL of clear yellow urine.               Complications: None; patient tolerated the procedure well.    Impression/Recommendation BPH complicated by acute urinary retention  Foley catheter placed by Urology at bedside without difficulty. Given retention episode and elevated bladder scans prior to insertion, recommend continuing catheter x10-14d for a period of bladder rest. He can then proceed with TOV as an inpatient or outpatient, depending on his disposition. Would recommend continuing flomax on discharge.   Casimiro Needle Ravin Bendall 05/13/2022, 11:47 AM

## 2022-05-13 NOTE — Progress Notes (Signed)
Called Eligha Bridegroom RN for report. Pt alert and oriented, surgical dressing clean dry and intact. Immediate needs addressed. Waiting for [facility] to transport pt.

## 2022-05-13 NOTE — Progress Notes (Signed)
     Subjective: 3 Days Post-Op s/p Procedure(s): INTRAMEDULLARY (IM) NAIL FEMORAL   Patient is alert, oriented.  Patient reports pain as severe with movement.  Has had a bowel movement. Struggled to void yesterday and was catheterized.  Denies chest pain, SOB, Calf pain. No nausea/vomiting. No other complaints.    Objective:  PE: VITALS:   Vitals:   05/12/22 1345 05/12/22 2136 05/13/22 0542 05/13/22 0837  BP: 132/66 (!) 116/56 (!) 140/54   Pulse: 100 88 81   Resp: 17 16 16    Temp: 97.8 F (36.6 C) 98.3 F (36.8 C) 98.6 F (37 C)   TempSrc: Oral Oral    SpO2: 93% 98% 94% 95%  Weight:      Height:       Sensation intact distally Intact pulses distally Dorsiflexion/Plantar flexion intact Incision: dressing C/D/I Compartment soft    LABS  No results found for this or any previous visit (from the past 24 hour(s)).  No results found.  Assessment/Plan: Principal Problem:   Closed intertrochanteric fracture of left femur, initial encounter (HCC) Active Problems:   Hypothyroid   Hyperlipidemia with target LDL less than 70   CAD (coronary artery disease)   Essential hypertension   COPD (chronic obstructive pulmonary disease) (HCC)   Anxiety and depression   Thrombocytopenia (HCC)   Chronic kidney disease, stage 3a (HCC)   Protein-calorie malnutrition, severe   S/p L intertroch femur fracture ORIF 12/13   Post op recs: WB: WBAT RLE Abx: ancef x23 hours post op Imaging: PACU xrays Dressing: keep intact until follow up, change PRN if soiled or saturated. DVT prophylaxis: lovenox starting POD1 x4 weeks Follow up: 2 weeks after surgery for a wound check with Dr. 1/14 at [facility].  Address: [address], [place], [place]  Office Phone: [phone]     [phone] 05/13/2022, 9:29 AM

## 2022-05-13 NOTE — Plan of Care (Signed)
  Problem: Education: Goal: Knowledge of General Education information will improve Description Including pain rating scale, medication(s)/side effects and non-pharmacologic comfort measures Outcome: Progressing   Problem: Clinical Measurements: Goal: Ability to maintain clinical measurements within normal limits will improve Outcome: Progressing   Problem: Activity: Goal: Risk for activity intolerance will decrease Outcome: Progressing   

## 2022-05-13 NOTE — TOC Progression Note (Signed)
Transition of Care [facility]) - Progression Note    Patient Details  Name: Timothy Spencer MRN: [id number] Date of Birth: 1940/07/24  [facility] CM/SW Contact  Larrie Kass, LCSW Phone Number: 05/13/2022, 2:51 PM  Clinical Narrative:    Sharin Mons called pt has been added to list. Pt's room 601 call reported to [phone]. CSW called pt's spouse to provide update , no response left VM.  No additional TOC needs.    Expected Discharge Plan: Skilled Nursing Facility Barriers to Discharge: Continued Medical Work up, English as a second language teacher, SNF Pending bed offer  Expected Discharge Plan and Services Expected Discharge Plan: Skilled Nursing Facility In-house Referral: Clinical Social Work   Post Acute Care Choice: Skilled Nursing Facility Living arrangements for the past 2 months: Apartment Expected Discharge Date: 05/13/22               DME Arranged: N/A DME Agency: NA                   Social Determinants of Health (SDOH) Interventions    Readmission Risk Interventions     No data to display

## 2022-05-13 NOTE — Progress Notes (Signed)
Patient has greater than 500 ml of urine in bladder per assigned nurse. Unable to void. Called Urology nurse to insert 14 fr  in and out cath to drain bladder s/p unsuccessful attempt with a 16 fr. Attempt made and was unsuccessful also. During insertion felt coiling of cath. And scant amount of blood tinged secretion from penis. Will make assigned nurse aware.

## 2022-05-13 NOTE — Progress Notes (Signed)
After Urology CN Rinaldo Cloud unsuccessful attempt to I&O cath Mr. Levesque, she suggested a Urology consult.  Anthoney Harada, NP notified of the urinary retention and the failed I&O cath attempts. See MD orders.

## 2022-05-13 NOTE — Discharge Summary (Signed)
Physician Discharge Summary  Timothy Spencer PTW:[id number] DOB: 07-14-1940 DOA: 05/09/2022  PCP: Merri Brunette, MD  Admit date: 05/09/2022 Discharge date: 05/13/2022  Admitted From: Home Discharge disposition: SNF  Recommendations at discharge:  Post op recs per orthopedics: WB: WBAT RLE Dressing: keep intact until follow up, change PRN if soiled or saturated. DVT prophylaxis: lovenox 4 weeks Follow up: 2 weeks after surgery for a wound check with Dr. Blanchie Dessert at [facility].     Brief narrative: Timothy Spencer is a 81 y.o. male with PMH significant for HTN, HLD, COPD, CAD, CKD stage IIIa, RBBB, hypothyroidism, RLS, BPH. 12/12, patient was brought from home to the ED by EMS for evaluation of left hip pain after a fall. Patient states he was walking downstairs  when he missed a step and fell onto his left hip.  He had severe pain and was unable to stand up on his own.  He did not hit his head or lose consciousness.    In the ED, patient was afebrile, blood pressure elevated 168/76 Labs with WBC count 9.3, hemoglobin 13.3, platelets 123,000, sodium 140, potassium 4.3, bicarb 22, BUN 20, creatinine 1.41, serum glucose 128. Left hip x-ray showed a mildly impacted left intertrochanteric fracture. Portable chest x-ray showed chronically elevated right hemidiaphragm.  No focal consolidation, edema, effusion. Orthopedics, Dr. Blanchie Dessert, was consulted from ED.  Recommended medical admission with plan for potential surgical fixation on 12/13. Admitted to [facility] 12/13, underwent left intramedullary nailing  Subjective: Patient was seen and examined this morning. Propped up in bed.  Not in distress. Foley catheter was removed yesterday.  Failed voiding trial.  Nursing unable to reinsert catheter.  Urology was consulted this morning.  New Foley catheter in place.  Assessment and plan: Acute impacted left intertrochanteric fracture Secondary to mechanical fall  12/13,  underwent left intramedullary nailing Post op recs per orthopedics: WB: WBAT RLE Dressing: keep intact until follow up, change PRN if soiled or saturated. DVT prophylaxis: lovenox 4 weeks Follow up: 2 weeks after surgery for a wound check with Dr. Blanchie Dessert at [facility].   Severe vitamin D deficiency Vitamin D level severely low at 9.8.   Replacement ordered.  To continue post discharge.  Acute urinary retention Patient was retaining urine more than 600 mL preop.  Probably due to pain.  Foley catheter was inserted.  No history of prostate enlargement. Flomax 0.4 mg daily was added.   12/15, failed voiding trial.  Nursing unable to reinsert catheter.  Urology was consulted this morning.  New Foley catheter in place.  Acute metabolic encephalopathy Mild delirium, post op. Multifactorial: pain meds, hospitalization. 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  Mental status improved.   AKI on CKD 3a Creatinine at baseline less than 1.5. Postoperative creatinine was elevated up to 2.1.  Improved with IV fluid.  1.45 on last check on 12/15. Recent Labs    01/29/22 1844 05/09/22 1940 05/10/22 0112 05/11/22 1203 05/12/22 0922  BUN 22 20 19  41* 38*  CREATININE 1.81* 1.41* 1.60* 2.10* 1.45*    CAD (coronary artery disease) HLD Stable, denies any chest pain.  Does not appear to be on antiplatelet.  Continue rosuvastatin.   COPD (chronic obstructive pulmonary disease)  Stable, no wheezing on admission.   Continue Breztri BID DuoNeb and albuterol as needed   Essential hypertension Continue amlodipine and Flomax.  Losartan on hold.  Diastolic blood pressure in the lower normal range.  Continue to hold losartan  post discharge.    Hypothyroid Continue Synthroid.   Thrombocytopenia  Mild without obvious bleeding.  Continue to monitor. Recent Labs  Lab 05/09/22 1940 05/10/22 0112 05/11/22 1203 05/12/22 0922  PLT 123* 129* 116* 122*   Anxiety and  depression Continue bupropion and sertraline.   Wounds:  - Incision (Closed) 05/10/22 Thigh Anterior;Left;Upper (Active)  Date First Assessed/Time First Assessed: 05/10/22 1700   Location: Thigh  Location Orientation: Anterior;Left;Upper  Present on Admission: No    Assessments 05/10/2022  5:20 PM 05/13/2022  9:00 AM  Dressing Type Liquid skin adhesive;Gauze (Comment);Transparent dressing Gauze (Comment);Transparent dressing  Dressing -- Clean, Dry, Intact  Site / Wound Assessment -- Dressing in place / Unable to assess  Drainage Amount -- None  Treatment -- Ice applied     No associated orders.     Incision (Closed) 05/10/22 Thigh Anterior;Left;Lower (Active)  Date First Assessed/Time First Assessed: 05/10/22 1700   Location: Thigh  Location Orientation: Anterior;Left;Lower    Assessments 05/10/2022  5:20 PM 05/13/2022  9:00 AM  Dressing Type Liquid skin adhesive;Gauze (Comment);Transparent dressing Gauze (Comment);Transparent dressing  Dressing -- Clean, Dry, Intact  Site / Wound Assessment -- Dressing in place / Unable to assess  Drainage Amount -- None  Treatment -- Ice applied     No associated orders.     Incision (Closed) 05/10/22 Hip Left (Active)  Date First Assessed/Time First Assessed: 05/10/22 1716   Location: Hip  Location Orientation: Left    No assessment data to display     No associated orders.    Discharge Exam:   Vitals:   05/12/22 1345 05/12/22 2136 05/13/22 0542 05/13/22 0837  BP: 132/66 (!) 116/56 (!) 140/54   Pulse: 100 88 81   Resp: 17 16 16    Temp: 97.8 F (36.6 C) 98.3 F (36.8 C) 98.6 F (37 C)   TempSrc: Oral Oral    SpO2: 93% 98% 94% 95%  Weight:      Height:        Body mass index is 23.68 kg/m.  General exam: Pleasant, elderly Caucasian male.  Not in pain this morning.  Foley catheter in place. Skin: No rashes, lesions or ulcers. HEENT: Atraumatic, normocephalic, no obvious bleeding Lungs: Clear to auscultation  bilaterally CVS: Regular rate and rhythm, no murmur. GI/Abd soft, nontender, nondistended, bowel sounds present. CNS: Alert, awake, oriented to place.  Slow to respond.   Psychiatry: Mood appropriate Extremities: No pedal edema, no calf tenderness  Follow ups:    Contact information for follow-up providers     , MD. Schedule an appointment as soon as possible for a visit in 2 week(s).   Specialty: Orthopedic Surgery Contact information: [address] [place] [phone]         [phone], MD. Call.   Specialty: Urology Contact information: [address] [place] [phone]         [phone], MD Follow up.   Specialty: Internal Medicine Contact information: [address] [place] [phone]              Contact information for after-discharge care     Destination     [facility] .   Service: Skilled Nursing Contact information: [address] [phone] [place] [phone]                     Discharge Instructions:   Discharge Instructions  Call MD for:  difficulty breathing, headache or visual disturbances   Complete by: As directed    Call MD for:  extreme fatigue   Complete by: As directed    Call MD for:  hives   Complete by: As directed    Call MD for:  persistant dizziness or light-headedness   Complete by: As directed    Call MD for:  persistant nausea and vomiting   Complete by: As directed    Call MD for:  severe uncontrolled pain   Complete by: As directed    Call MD for:  temperature >100.4   Complete by: As directed    Diet general   Complete by: As directed    Discharge instructions   Complete by: As directed    Recommendations at discharge:  Post op recs per orthopedics:  WB: WBAT RLE  Dressing: keep intact until follow up, change PRN if soiled or saturated.  DVT  prophylaxis: lovenox 4 weeks  Follow up: 2 weeks after surgery for a wound check with Dr. Blanchie Dessert at [facility].   General discharge instructions: Follow with Primary MD Merri Brunette, MD in 7 days  Please request your PCP  to go over your hospital tests, procedures, radiology results at the follow up. Please get your medicines reviewed and adjusted.  Your PCP may decide to repeat certain labs or tests as needed. Do not drive, operate heavy machinery, perform activities at heights, swimming or participation in water activities or provide baby sitting services if your were admitted for syncope or siezures until you have seen by Primary MD or a Neurologist and advised to do so again. [place] Controlled Substance Reporting System database was reviewed. Do not drive, operate heavy machinery, perform activities at heights, swim, participate in water activities or provide baby-sitting services while on medications for pain, sleep and mood until your outpatient physician has reevaluated you and advised to do so again.  You are strongly recommended to comply with the dose, frequency and duration of prescribed medications. Activity: As tolerated with Full fall precautions use walker/cane & assistance as needed Avoid using any recreational substances like cigarette, tobacco, alcohol, or non-prescribed drug. If you experience worsening of your admission symptoms, develop shortness of breath, life threatening emergency, suicidal or homicidal thoughts you must seek medical attention immediately by calling 911 or calling your MD immediately  if symptoms less severe. You must read complete instructions/literature along with all the possible adverse reactions/side effects for all the medicines you take and that have been prescribed to you. Take any new medicine only after you have completely understood and accepted all the possible adverse reactions/side effects.  Wear Seat belts while  driving. You were cared for by a hospitalist during your hospital stay. If you have any questions about your discharge medications or the care you received while you were in the hospital after you are discharged, you can call the unit and ask to speak with the hospitalist or the covering physician. Once you are discharged, your primary care physician will handle any further medical issues. Please note that NO REFILLS for any discharge medications will be authorized once you are discharged, as it is imperative that you return to your primary care physician (or establish a relationship with a primary care physician if you do not have one).   Discharge wound care:   Complete by: As directed    Increase activity slowly   Complete by: As directed  Discharge Medications:   Allergies as of 05/13/2022       Reactions   Hydrochlorothiazide W-spironolactone    Other Reaction(s): dizziness, fainting        Medication List     STOP taking these medications    losartan 50 MG tablet Commonly known as: COZAAR       TAKE these medications    acetaminophen 500 MG tablet Commonly known as: TYLENOL Take 1,000 mg by mouth every 6 (six) hours as needed for moderate pain.   albuterol 108 (90 Base) MCG/ACT inhaler Commonly known as: VENTOLIN HFA Inhale 2 puffs into the lungs every 6 (six) hours as needed for wheezing or shortness of breath.   amLODipine 10 MG tablet Commonly known as: NORVASC Take 10 mg by mouth daily.   Breztri Aerosphere 160-9-4.8 MCG/ACT Aero Generic drug: Budeson-Glycopyrrol-Formoterol Inhale 2 puffs into the lungs in the morning and at bedtime.   buPROPion 150 MG 24 hr tablet Commonly known as: WELLBUTRIN XL Take 150 mg by mouth every morning.   enoxaparin 40 MG/0.4ML injection Commonly known as: LOVENOX Inject 0.4 mLs (40 mg total) into the skin daily for 28 days.   feeding supplement Liqd Take 237 mLs by mouth 2 (two) times daily between meals.    ipratropium-albuterol 0.5-2.5 (3) MG/3ML Soln Commonly known as: DUONEB Take 3 mLs by nebulization every 4 (four) hours as needed. What changed: reasons to take this   latanoprost 0.005 % ophthalmic solution Commonly known as: XALATAN Place 1 drop into both eyes at bedtime.   levothyroxine 112 MCG tablet Commonly known as: SYNTHROID Take 112 mcg by mouth daily.   oxyCODONE 5 MG immediate release tablet Commonly known as: Roxicodone Take 0.5-1 tablets (2.5-5 mg total) by mouth every 4 (four) hours as needed for up to 7 days for severe pain or moderate pain.   rOPINIRole 1 MG tablet Commonly known as: REQUIP Take 1 mg by mouth 3 (three) times daily.   rosuvastatin 5 MG tablet Commonly known as: CRESTOR Take 5 mg by mouth daily.   senna-docusate 8.6-50 MG tablet Commonly known as: Senokot-S Take 1 tablet by mouth at bedtime as needed for mild constipation.   sertraline 100 MG tablet Commonly known as: ZOLOFT Take 100 mg by mouth daily.   tamsulosin 0.4 MG Caps capsule Commonly known as: FLOMAX Take 0.4 mg by mouth daily.   Vitamin D (Ergocalciferol) 1.25 MG (50000 UNIT) Caps capsule Commonly known as: DRISDOL Take 1 capsule (50,000 Units total) by mouth every 7 (seven) days.   vitamin D3 25 MCG tablet Commonly known as: CHOLECALCIFEROL Take 1 tablet (1,000 Units total) by mouth daily. Start taking on: May 14, 2022               Discharge Care Instructions  (From admission, onward)           Start     Ordered   05/13/22 0000  Discharge wound care:        05/13/22 1405             The results of significant diagnostics from this hospitalization (including imaging, microbiology, ancillary and laboratory) are listed below for reference.    Procedures and Diagnostic Studies:   DG HIP UNILAT W OR W/O PELVIS 2-3 VIEWS LEFT  Result Date: 05/10/2022 CLINICAL DATA:  Status post ORIF of left intratrochanteric fracture EXAM: DG HIP (WITH OR  WITHOUT PELVIS) 2-3V LEFT COMPARISON:  Intraoperative films from earlier in the same day. FINDINGS: Proximal  left femoral medullary rod is noted with fixation screws traversing the femoral neck. Fracture fragments are in near anatomic alignment. IMPRESSION: Status post ORIF of left femoral fracture. Electronically Signed   By: Alcide Clever M.D.   On: 05/10/2022 19:57   DG HIP UNILAT WITH PELVIS 2-3 VIEWS LEFT  Result Date: 05/10/2022 CLINICAL DATA:  Fluoroscopic assistance for internal fixation of left femur EXAM: DG HIP (WITH OR WITHOUT PELVIS) 2-3V LEFT COMPARISON:  05/09/2022 FINDINGS: Fluoroscopic images show internal fixation of fracture of left femur with intramedullary rod. Fluoroscopic time 1 minute and 14 seconds. Radiation dose 19.84 mGy. IMPRESSION: Fluoroscopic assistance was provided for internal fixation of fracture of neck of left femur. Electronically Signed   By: Ernie Avena M.D.   On: 05/10/2022 17:34   DG C-Arm 1-60 Min-No Report  Result Date: 05/10/2022 Fluoroscopy was utilized by the requesting physician.  No radiographic interpretation.   DG FEMUR PORT MIN 2 VIEWS LEFT  Result Date: 05/09/2022 CLINICAL DATA:  Left hip fracture EXAM: LEFT FEMUR PORTABLE 2 VIEWS COMPARISON:  None Available. FINDINGS: Impacted left hip intratrochanteric fractures better assessed on prior dedicated radiographs of the left hip. The distal femur is intact. Left femoral head is still seated within the left acetabulum. Mild superimposed left hip degenerative arthritis. Soft tissues are unremarkable. IMPRESSION: 1. Impacted left hip intratrochanteric fracture. Electronically Signed   By: Helyn Numbers M.D.   On: 05/09/2022 21:15   DG Chest Port 1 View  Result Date: 05/09/2022 CLINICAL DATA:  Recent fall with known left femoral fracture, initial encounter EXAM: PORTABLE CHEST 1 VIEW COMPARISON:  08/10/2021 FINDINGS: Right hemidiaphragm is again elevated. Cardiac shadow is within normal  limits. Aortic calcifications are seen. No focal infiltrate is noted. No bony abnormality is seen. IMPRESSION: No change from the prior study. Electronically Signed   By: Alcide Clever M.D.   On: 05/09/2022 20:19   DG Hip Unilat With Pelvis 2-3 Views Left  Result Date: 05/09/2022 CLINICAL DATA:  Left-sided hip pain following fall, initial encounter EXAM: DG HIP (WITH OR WITHOUT PELVIS) 3V LEFT COMPARISON:  None Available. FINDINGS: Minimally displaced intratrochanteric fracture is noted. Some impaction at the fracture site is noted as well. Pelvic ring is intact. Postsurgical changes in the right femur are noted. No other focal abnormality is seen. IMPRESSION: Mildly impacted left intratrochanteric fracture. Electronically Signed   By: Alcide Clever M.D.   On: 05/09/2022 20:19     Labs:   Basic Metabolic Panel: Recent Labs  Lab 05/09/22 1940 05/10/22 0112 05/11/22 1203 05/12/22 0922  NA 140 140 135 135  K 4.3 4.5 4.3 4.4  CL 111 110 107 107  CO2 22 21* 18* 20*  GLUCOSE 128* 124* 100* 107*  BUN 20 19 41* 38*  CREATININE 1.41* 1.60* 2.10* 1.45*  CALCIUM 8.9 9.1 8.3* 8.5*   GFR Estimated Creatinine Clearance: 41.8 mL/min (A) (by C-G formula based on SCr of 1.45 mg/dL (H)). Liver Function Tests: No results for input(s): "AST", "ALT", "ALKPHOS", "BILITOT", "PROT", "ALBUMIN" in the last 168 hours. No results for input(s): "LIPASE", "AMYLASE" in the last 168 hours. No results for input(s): "AMMONIA" in the last 168 hours. Coagulation profile No results for input(s): "INR", "PROTIME" in the last 168 hours.  CBC: Recent Labs  Lab 05/09/22 1940 05/10/22 0112 05/11/22 1203 05/12/22 0922  WBC 9.3 10.4 9.6 9.5  NEUTROABS 7.5  --   --   --   HGB 13.3 12.8* 9.0* 9.1*  HCT 39.5 37.8*  26.6* 26.8*  MCV 95.0 95.2 97.1 96.4  PLT 123* 129* 116* 122*   Cardiac Enzymes: No results for input(s): "CKTOTAL", "CKMB", "CKMBINDEX", "TROPONINI" in the last 168 hours. BNP: Invalid input(s):  "POCBNP" CBG: No results for input(s): "GLUCAP" in the last 168 hours. D-Dimer No results for input(s): "DDIMER" in the last 72 hours. Hgb A1c No results for input(s): "HGBA1C" in the last 72 hours. Lipid Profile No results for input(s): "CHOL", "HDL", "LDLCALC", "TRIG", "CHOLHDL", "LDLDIRECT" in the last 72 hours. Thyroid function studies No results for input(s): "TSH", "T4TOTAL", "T3FREE", "THYROIDAB" in the last 72 hours.  Invalid input(s): "FREET3" Anemia work up No results for input(s): "VITAMINB12", "FOLATE", "FERRITIN", "TIBC", "IRON", "RETICCTPCT" in the last 72 hours. Microbiology Recent Results (from the past 240 hour(s))  Surgical pcr screen     Status: None   Collection Time: 05/10/22 10:24 AM   Specimen: Nasal Mucosa; Nasal Swab  Result Value Ref Range Status   MRSA, PCR NEGATIVE NEGATIVE Final   Staphylococcus aureus NEGATIVE NEGATIVE Final    Comment: (NOTE) The Xpert SA Assay (FDA approved for NASAL specimens in patients 23 years of age and older), is one component of a comprehensive surveillance program. It is not intended to diagnose infection nor to guide or monitor treatment. Performed at [facility], [address], [place], [place] [address]     Time coordinating discharge: 35 minutes  Signed: Melina Schools Mariana Wiederholt  [facility] 05/13/2022, 2:05 PM

## 2022-05-13 NOTE — Progress Notes (Signed)
Patient has not voided since foley removal. Bladder scan shows 585 cc, the patient denies any urge to void.  Attempted I & O cath w/ 16 fr. Catheter.  Unable to successfully pass cath with reasonable effort.  Pt tolerated attempt fair. Urology CN, Hayden Pedro, RN notified for assistance.  See Pamela's note.

## 2022-05-15 DIAGNOSIS — S72145D Nondisplaced intertrochanteric fracture of left femur, subsequent encounter for closed fracture with routine healing: Secondary | ICD-10-CM | POA: Diagnosis not present

## 2022-05-15 DIAGNOSIS — N179 Acute kidney failure, unspecified: Secondary | ICD-10-CM | POA: Diagnosis not present

## 2022-05-15 DIAGNOSIS — N189 Chronic kidney disease, unspecified: Secondary | ICD-10-CM | POA: Diagnosis not present

## 2022-05-15 DIAGNOSIS — R338 Other retention of urine: Secondary | ICD-10-CM | POA: Diagnosis not present

## 2022-05-17 DIAGNOSIS — E039 Hypothyroidism, unspecified: Secondary | ICD-10-CM | POA: Diagnosis not present

## 2022-05-17 DIAGNOSIS — R338 Other retention of urine: Secondary | ICD-10-CM | POA: Diagnosis not present

## 2022-05-17 DIAGNOSIS — S72142D Displaced intertrochanteric fracture of left femur, subsequent encounter for closed fracture with routine healing: Secondary | ICD-10-CM | POA: Diagnosis not present

## 2022-05-17 DIAGNOSIS — J449 Chronic obstructive pulmonary disease, unspecified: Secondary | ICD-10-CM | POA: Diagnosis not present

## 2022-05-17 DIAGNOSIS — I129 Hypertensive chronic kidney disease with stage 1 through stage 4 chronic kidney disease, or unspecified chronic kidney disease: Secondary | ICD-10-CM | POA: Diagnosis not present

## 2022-05-17 DIAGNOSIS — N179 Acute kidney failure, unspecified: Secondary | ICD-10-CM | POA: Diagnosis not present

## 2022-05-17 DIAGNOSIS — I2510 Atherosclerotic heart disease of native coronary artery without angina pectoris: Secondary | ICD-10-CM | POA: Diagnosis not present

## 2022-05-17 DIAGNOSIS — E43 Unspecified severe protein-calorie malnutrition: Secondary | ICD-10-CM | POA: Diagnosis not present

## 2022-05-17 DIAGNOSIS — E559 Vitamin D deficiency, unspecified: Secondary | ICD-10-CM | POA: Diagnosis not present

## 2022-05-17 DIAGNOSIS — N400 Enlarged prostate without lower urinary tract symptoms: Secondary | ICD-10-CM | POA: Diagnosis not present

## 2022-05-17 DIAGNOSIS — R2689 Other abnormalities of gait and mobility: Secondary | ICD-10-CM | POA: Diagnosis not present

## 2022-05-17 DIAGNOSIS — E785 Hyperlipidemia, unspecified: Secondary | ICD-10-CM | POA: Diagnosis not present

## 2022-05-17 DIAGNOSIS — I4510 Unspecified right bundle-branch block: Secondary | ICD-10-CM | POA: Diagnosis not present

## 2022-05-17 DIAGNOSIS — N189 Chronic kidney disease, unspecified: Secondary | ICD-10-CM | POA: Diagnosis not present

## 2022-05-17 DIAGNOSIS — F419 Anxiety disorder, unspecified: Secondary | ICD-10-CM | POA: Diagnosis not present

## 2022-05-17 DIAGNOSIS — R339 Retention of urine, unspecified: Secondary | ICD-10-CM | POA: Diagnosis not present

## 2022-05-17 DIAGNOSIS — F32A Depression, unspecified: Secondary | ICD-10-CM | POA: Diagnosis not present

## 2022-05-17 DIAGNOSIS — R410 Disorientation, unspecified: Secondary | ICD-10-CM | POA: Diagnosis not present

## 2022-05-17 DIAGNOSIS — D696 Thrombocytopenia, unspecified: Secondary | ICD-10-CM | POA: Diagnosis not present

## 2022-05-17 DIAGNOSIS — N1831 Chronic kidney disease, stage 3a: Secondary | ICD-10-CM | POA: Diagnosis not present

## 2022-05-17 DIAGNOSIS — G2581 Restless legs syndrome: Secondary | ICD-10-CM | POA: Diagnosis not present

## 2022-05-17 DIAGNOSIS — W19XXXD Unspecified fall, subsequent encounter: Secondary | ICD-10-CM | POA: Diagnosis not present

## 2022-05-25 DIAGNOSIS — I2510 Atherosclerotic heart disease of native coronary artery without angina pectoris: Secondary | ICD-10-CM | POA: Diagnosis not present

## 2022-05-25 DIAGNOSIS — S72142D Displaced intertrochanteric fracture of left femur, subsequent encounter for closed fracture with routine healing: Secondary | ICD-10-CM | POA: Diagnosis not present

## 2022-05-25 DIAGNOSIS — R410 Disorientation, unspecified: Secondary | ICD-10-CM | POA: Diagnosis not present

## 2022-05-25 DIAGNOSIS — R2689 Other abnormalities of gait and mobility: Secondary | ICD-10-CM | POA: Diagnosis not present

## 2022-05-25 DIAGNOSIS — R339 Retention of urine, unspecified: Secondary | ICD-10-CM | POA: Diagnosis not present

## 2022-05-31 DIAGNOSIS — R338 Other retention of urine: Secondary | ICD-10-CM | POA: Diagnosis not present

## 2022-06-05 DIAGNOSIS — J449 Chronic obstructive pulmonary disease, unspecified: Secondary | ICD-10-CM | POA: Diagnosis not present

## 2022-06-05 DIAGNOSIS — E039 Hypothyroidism, unspecified: Secondary | ICD-10-CM | POA: Diagnosis not present

## 2022-06-05 DIAGNOSIS — N529 Male erectile dysfunction, unspecified: Secondary | ICD-10-CM | POA: Diagnosis not present

## 2022-06-05 DIAGNOSIS — H409 Unspecified glaucoma: Secondary | ICD-10-CM | POA: Diagnosis not present

## 2022-06-05 DIAGNOSIS — D696 Thrombocytopenia, unspecified: Secondary | ICD-10-CM | POA: Diagnosis not present

## 2022-06-05 DIAGNOSIS — H3530 Unspecified macular degeneration: Secondary | ICD-10-CM | POA: Diagnosis not present

## 2022-06-05 DIAGNOSIS — K219 Gastro-esophageal reflux disease without esophagitis: Secondary | ICD-10-CM | POA: Diagnosis not present

## 2022-06-05 DIAGNOSIS — M109 Gout, unspecified: Secondary | ICD-10-CM | POA: Diagnosis not present

## 2022-06-05 DIAGNOSIS — M8580 Other specified disorders of bone density and structure, unspecified site: Secondary | ICD-10-CM | POA: Diagnosis not present

## 2022-06-05 DIAGNOSIS — I440 Atrioventricular block, first degree: Secondary | ICD-10-CM | POA: Diagnosis not present

## 2022-06-05 DIAGNOSIS — N401 Enlarged prostate with lower urinary tract symptoms: Secondary | ICD-10-CM | POA: Diagnosis not present

## 2022-06-05 DIAGNOSIS — N1832 Chronic kidney disease, stage 3b: Secondary | ICD-10-CM | POA: Diagnosis not present

## 2022-06-05 DIAGNOSIS — I129 Hypertensive chronic kidney disease with stage 1 through stage 4 chronic kidney disease, or unspecified chronic kidney disease: Secondary | ICD-10-CM | POA: Diagnosis not present

## 2022-06-05 DIAGNOSIS — G629 Polyneuropathy, unspecified: Secondary | ICD-10-CM | POA: Diagnosis not present

## 2022-06-05 DIAGNOSIS — F1011 Alcohol abuse, in remission: Secondary | ICD-10-CM | POA: Diagnosis not present

## 2022-06-05 DIAGNOSIS — R338 Other retention of urine: Secondary | ICD-10-CM | POA: Diagnosis not present

## 2022-06-05 DIAGNOSIS — I2510 Atherosclerotic heart disease of native coronary artery without angina pectoris: Secondary | ICD-10-CM | POA: Diagnosis not present

## 2022-06-05 DIAGNOSIS — E785 Hyperlipidemia, unspecified: Secondary | ICD-10-CM | POA: Diagnosis not present

## 2022-06-05 DIAGNOSIS — N62 Hypertrophy of breast: Secondary | ICD-10-CM | POA: Diagnosis not present

## 2022-06-05 DIAGNOSIS — F32A Depression, unspecified: Secondary | ICD-10-CM | POA: Diagnosis not present

## 2022-06-05 DIAGNOSIS — J439 Emphysema, unspecified: Secondary | ICD-10-CM | POA: Diagnosis not present

## 2022-06-05 DIAGNOSIS — G4733 Obstructive sleep apnea (adult) (pediatric): Secondary | ICD-10-CM | POA: Diagnosis not present

## 2022-06-05 DIAGNOSIS — S72142D Displaced intertrochanteric fracture of left femur, subsequent encounter for closed fracture with routine healing: Secondary | ICD-10-CM | POA: Diagnosis not present

## 2022-06-05 DIAGNOSIS — Z466 Encounter for fitting and adjustment of urinary device: Secondary | ICD-10-CM | POA: Diagnosis not present

## 2022-06-06 DIAGNOSIS — D696 Thrombocytopenia, unspecified: Secondary | ICD-10-CM | POA: Diagnosis not present

## 2022-06-06 DIAGNOSIS — S72142D Displaced intertrochanteric fracture of left femur, subsequent encounter for closed fracture with routine healing: Secondary | ICD-10-CM | POA: Diagnosis not present

## 2022-06-06 DIAGNOSIS — N401 Enlarged prostate with lower urinary tract symptoms: Secondary | ICD-10-CM | POA: Diagnosis not present

## 2022-06-06 DIAGNOSIS — I129 Hypertensive chronic kidney disease with stage 1 through stage 4 chronic kidney disease, or unspecified chronic kidney disease: Secondary | ICD-10-CM | POA: Diagnosis not present

## 2022-06-06 DIAGNOSIS — I440 Atrioventricular block, first degree: Secondary | ICD-10-CM | POA: Diagnosis not present

## 2022-06-06 DIAGNOSIS — K219 Gastro-esophageal reflux disease without esophagitis: Secondary | ICD-10-CM | POA: Diagnosis not present

## 2022-06-06 DIAGNOSIS — J449 Chronic obstructive pulmonary disease, unspecified: Secondary | ICD-10-CM | POA: Diagnosis not present

## 2022-06-06 DIAGNOSIS — H409 Unspecified glaucoma: Secondary | ICD-10-CM | POA: Diagnosis not present

## 2022-06-06 DIAGNOSIS — E785 Hyperlipidemia, unspecified: Secondary | ICD-10-CM | POA: Diagnosis not present

## 2022-06-06 DIAGNOSIS — F32A Depression, unspecified: Secondary | ICD-10-CM | POA: Diagnosis not present

## 2022-06-06 DIAGNOSIS — I2510 Atherosclerotic heart disease of native coronary artery without angina pectoris: Secondary | ICD-10-CM | POA: Diagnosis not present

## 2022-06-06 DIAGNOSIS — R338 Other retention of urine: Secondary | ICD-10-CM | POA: Diagnosis not present

## 2022-06-06 DIAGNOSIS — N1832 Chronic kidney disease, stage 3b: Secondary | ICD-10-CM | POA: Diagnosis not present

## 2022-06-06 DIAGNOSIS — N529 Male erectile dysfunction, unspecified: Secondary | ICD-10-CM | POA: Diagnosis not present

## 2022-06-06 DIAGNOSIS — J439 Emphysema, unspecified: Secondary | ICD-10-CM | POA: Diagnosis not present

## 2022-06-06 DIAGNOSIS — Z466 Encounter for fitting and adjustment of urinary device: Secondary | ICD-10-CM | POA: Diagnosis not present

## 2022-06-08 DIAGNOSIS — R338 Other retention of urine: Secondary | ICD-10-CM | POA: Diagnosis not present

## 2022-06-08 DIAGNOSIS — I2510 Atherosclerotic heart disease of native coronary artery without angina pectoris: Secondary | ICD-10-CM | POA: Diagnosis not present

## 2022-06-08 DIAGNOSIS — J449 Chronic obstructive pulmonary disease, unspecified: Secondary | ICD-10-CM | POA: Diagnosis not present

## 2022-06-08 DIAGNOSIS — I129 Hypertensive chronic kidney disease with stage 1 through stage 4 chronic kidney disease, or unspecified chronic kidney disease: Secondary | ICD-10-CM | POA: Diagnosis not present

## 2022-06-08 DIAGNOSIS — D696 Thrombocytopenia, unspecified: Secondary | ICD-10-CM | POA: Diagnosis not present

## 2022-06-08 DIAGNOSIS — K219 Gastro-esophageal reflux disease without esophagitis: Secondary | ICD-10-CM | POA: Diagnosis not present

## 2022-06-08 DIAGNOSIS — Z466 Encounter for fitting and adjustment of urinary device: Secondary | ICD-10-CM | POA: Diagnosis not present

## 2022-06-08 DIAGNOSIS — H409 Unspecified glaucoma: Secondary | ICD-10-CM | POA: Diagnosis not present

## 2022-06-08 DIAGNOSIS — S72142D Displaced intertrochanteric fracture of left femur, subsequent encounter for closed fracture with routine healing: Secondary | ICD-10-CM | POA: Diagnosis not present

## 2022-06-08 DIAGNOSIS — J439 Emphysema, unspecified: Secondary | ICD-10-CM | POA: Diagnosis not present

## 2022-06-08 DIAGNOSIS — I440 Atrioventricular block, first degree: Secondary | ICD-10-CM | POA: Diagnosis not present

## 2022-06-08 DIAGNOSIS — F32A Depression, unspecified: Secondary | ICD-10-CM | POA: Diagnosis not present

## 2022-06-08 DIAGNOSIS — E785 Hyperlipidemia, unspecified: Secondary | ICD-10-CM | POA: Diagnosis not present

## 2022-06-08 DIAGNOSIS — N1832 Chronic kidney disease, stage 3b: Secondary | ICD-10-CM | POA: Diagnosis not present

## 2022-06-08 DIAGNOSIS — N401 Enlarged prostate with lower urinary tract symptoms: Secondary | ICD-10-CM | POA: Diagnosis not present

## 2022-06-08 DIAGNOSIS — N529 Male erectile dysfunction, unspecified: Secondary | ICD-10-CM | POA: Diagnosis not present

## 2022-06-09 DIAGNOSIS — J449 Chronic obstructive pulmonary disease, unspecified: Secondary | ICD-10-CM | POA: Diagnosis not present

## 2022-06-09 DIAGNOSIS — K219 Gastro-esophageal reflux disease without esophagitis: Secondary | ICD-10-CM | POA: Diagnosis not present

## 2022-06-09 DIAGNOSIS — F32A Depression, unspecified: Secondary | ICD-10-CM | POA: Diagnosis not present

## 2022-06-09 DIAGNOSIS — I2510 Atherosclerotic heart disease of native coronary artery without angina pectoris: Secondary | ICD-10-CM | POA: Diagnosis not present

## 2022-06-09 DIAGNOSIS — J439 Emphysema, unspecified: Secondary | ICD-10-CM | POA: Diagnosis not present

## 2022-06-09 DIAGNOSIS — D696 Thrombocytopenia, unspecified: Secondary | ICD-10-CM | POA: Diagnosis not present

## 2022-06-09 DIAGNOSIS — H409 Unspecified glaucoma: Secondary | ICD-10-CM | POA: Diagnosis not present

## 2022-06-09 DIAGNOSIS — N1832 Chronic kidney disease, stage 3b: Secondary | ICD-10-CM | POA: Diagnosis not present

## 2022-06-09 DIAGNOSIS — R338 Other retention of urine: Secondary | ICD-10-CM | POA: Diagnosis not present

## 2022-06-09 DIAGNOSIS — N529 Male erectile dysfunction, unspecified: Secondary | ICD-10-CM | POA: Diagnosis not present

## 2022-06-09 DIAGNOSIS — N401 Enlarged prostate with lower urinary tract symptoms: Secondary | ICD-10-CM | POA: Diagnosis not present

## 2022-06-09 DIAGNOSIS — Z466 Encounter for fitting and adjustment of urinary device: Secondary | ICD-10-CM | POA: Diagnosis not present

## 2022-06-09 DIAGNOSIS — I129 Hypertensive chronic kidney disease with stage 1 through stage 4 chronic kidney disease, or unspecified chronic kidney disease: Secondary | ICD-10-CM | POA: Diagnosis not present

## 2022-06-09 DIAGNOSIS — S72142D Displaced intertrochanteric fracture of left femur, subsequent encounter for closed fracture with routine healing: Secondary | ICD-10-CM | POA: Diagnosis not present

## 2022-06-09 DIAGNOSIS — E785 Hyperlipidemia, unspecified: Secondary | ICD-10-CM | POA: Diagnosis not present

## 2022-06-09 DIAGNOSIS — I440 Atrioventricular block, first degree: Secondary | ICD-10-CM | POA: Diagnosis not present

## 2022-06-12 DIAGNOSIS — E785 Hyperlipidemia, unspecified: Secondary | ICD-10-CM | POA: Diagnosis not present

## 2022-06-12 DIAGNOSIS — N401 Enlarged prostate with lower urinary tract symptoms: Secondary | ICD-10-CM | POA: Diagnosis not present

## 2022-06-12 DIAGNOSIS — S72142D Displaced intertrochanteric fracture of left femur, subsequent encounter for closed fracture with routine healing: Secondary | ICD-10-CM | POA: Diagnosis not present

## 2022-06-12 DIAGNOSIS — I129 Hypertensive chronic kidney disease with stage 1 through stage 4 chronic kidney disease, or unspecified chronic kidney disease: Secondary | ICD-10-CM | POA: Diagnosis not present

## 2022-06-12 DIAGNOSIS — J439 Emphysema, unspecified: Secondary | ICD-10-CM | POA: Diagnosis not present

## 2022-06-12 DIAGNOSIS — D696 Thrombocytopenia, unspecified: Secondary | ICD-10-CM | POA: Diagnosis not present

## 2022-06-12 DIAGNOSIS — F32A Depression, unspecified: Secondary | ICD-10-CM | POA: Diagnosis not present

## 2022-06-12 DIAGNOSIS — I440 Atrioventricular block, first degree: Secondary | ICD-10-CM | POA: Diagnosis not present

## 2022-06-12 DIAGNOSIS — N1832 Chronic kidney disease, stage 3b: Secondary | ICD-10-CM | POA: Diagnosis not present

## 2022-06-12 DIAGNOSIS — H409 Unspecified glaucoma: Secondary | ICD-10-CM | POA: Diagnosis not present

## 2022-06-12 DIAGNOSIS — N529 Male erectile dysfunction, unspecified: Secondary | ICD-10-CM | POA: Diagnosis not present

## 2022-06-12 DIAGNOSIS — I2510 Atherosclerotic heart disease of native coronary artery without angina pectoris: Secondary | ICD-10-CM | POA: Diagnosis not present

## 2022-06-12 DIAGNOSIS — Z466 Encounter for fitting and adjustment of urinary device: Secondary | ICD-10-CM | POA: Diagnosis not present

## 2022-06-12 DIAGNOSIS — J449 Chronic obstructive pulmonary disease, unspecified: Secondary | ICD-10-CM | POA: Diagnosis not present

## 2022-06-12 DIAGNOSIS — K219 Gastro-esophageal reflux disease without esophagitis: Secondary | ICD-10-CM | POA: Diagnosis not present

## 2022-06-12 DIAGNOSIS — R338 Other retention of urine: Secondary | ICD-10-CM | POA: Diagnosis not present

## 2022-06-13 DIAGNOSIS — I129 Hypertensive chronic kidney disease with stage 1 through stage 4 chronic kidney disease, or unspecified chronic kidney disease: Secondary | ICD-10-CM | POA: Diagnosis not present

## 2022-06-13 DIAGNOSIS — N529 Male erectile dysfunction, unspecified: Secondary | ICD-10-CM | POA: Diagnosis not present

## 2022-06-13 DIAGNOSIS — H409 Unspecified glaucoma: Secondary | ICD-10-CM | POA: Diagnosis not present

## 2022-06-13 DIAGNOSIS — J449 Chronic obstructive pulmonary disease, unspecified: Secondary | ICD-10-CM | POA: Diagnosis not present

## 2022-06-13 DIAGNOSIS — I440 Atrioventricular block, first degree: Secondary | ICD-10-CM | POA: Diagnosis not present

## 2022-06-13 DIAGNOSIS — R338 Other retention of urine: Secondary | ICD-10-CM | POA: Diagnosis not present

## 2022-06-13 DIAGNOSIS — J439 Emphysema, unspecified: Secondary | ICD-10-CM | POA: Diagnosis not present

## 2022-06-13 DIAGNOSIS — N401 Enlarged prostate with lower urinary tract symptoms: Secondary | ICD-10-CM | POA: Diagnosis not present

## 2022-06-13 DIAGNOSIS — I2510 Atherosclerotic heart disease of native coronary artery without angina pectoris: Secondary | ICD-10-CM | POA: Diagnosis not present

## 2022-06-13 DIAGNOSIS — S72142D Displaced intertrochanteric fracture of left femur, subsequent encounter for closed fracture with routine healing: Secondary | ICD-10-CM | POA: Diagnosis not present

## 2022-06-13 DIAGNOSIS — E785 Hyperlipidemia, unspecified: Secondary | ICD-10-CM | POA: Diagnosis not present

## 2022-06-13 DIAGNOSIS — Z466 Encounter for fitting and adjustment of urinary device: Secondary | ICD-10-CM | POA: Diagnosis not present

## 2022-06-13 DIAGNOSIS — K219 Gastro-esophageal reflux disease without esophagitis: Secondary | ICD-10-CM | POA: Diagnosis not present

## 2022-06-13 DIAGNOSIS — N1832 Chronic kidney disease, stage 3b: Secondary | ICD-10-CM | POA: Diagnosis not present

## 2022-06-13 DIAGNOSIS — D696 Thrombocytopenia, unspecified: Secondary | ICD-10-CM | POA: Diagnosis not present

## 2022-06-13 DIAGNOSIS — F32A Depression, unspecified: Secondary | ICD-10-CM | POA: Diagnosis not present

## 2022-06-14 DIAGNOSIS — I129 Hypertensive chronic kidney disease with stage 1 through stage 4 chronic kidney disease, or unspecified chronic kidney disease: Secondary | ICD-10-CM | POA: Diagnosis not present

## 2022-06-14 DIAGNOSIS — N401 Enlarged prostate with lower urinary tract symptoms: Secondary | ICD-10-CM | POA: Diagnosis not present

## 2022-06-14 DIAGNOSIS — I2510 Atherosclerotic heart disease of native coronary artery without angina pectoris: Secondary | ICD-10-CM | POA: Diagnosis not present

## 2022-06-14 DIAGNOSIS — K219 Gastro-esophageal reflux disease without esophagitis: Secondary | ICD-10-CM | POA: Diagnosis not present

## 2022-06-14 DIAGNOSIS — E785 Hyperlipidemia, unspecified: Secondary | ICD-10-CM | POA: Diagnosis not present

## 2022-06-14 DIAGNOSIS — H409 Unspecified glaucoma: Secondary | ICD-10-CM | POA: Diagnosis not present

## 2022-06-14 DIAGNOSIS — I440 Atrioventricular block, first degree: Secondary | ICD-10-CM | POA: Diagnosis not present

## 2022-06-14 DIAGNOSIS — N1832 Chronic kidney disease, stage 3b: Secondary | ICD-10-CM | POA: Diagnosis not present

## 2022-06-14 DIAGNOSIS — N529 Male erectile dysfunction, unspecified: Secondary | ICD-10-CM | POA: Diagnosis not present

## 2022-06-14 DIAGNOSIS — J449 Chronic obstructive pulmonary disease, unspecified: Secondary | ICD-10-CM | POA: Diagnosis not present

## 2022-06-14 DIAGNOSIS — Z466 Encounter for fitting and adjustment of urinary device: Secondary | ICD-10-CM | POA: Diagnosis not present

## 2022-06-14 DIAGNOSIS — S72142D Displaced intertrochanteric fracture of left femur, subsequent encounter for closed fracture with routine healing: Secondary | ICD-10-CM | POA: Diagnosis not present

## 2022-06-14 DIAGNOSIS — F32A Depression, unspecified: Secondary | ICD-10-CM | POA: Diagnosis not present

## 2022-06-14 DIAGNOSIS — R338 Other retention of urine: Secondary | ICD-10-CM | POA: Diagnosis not present

## 2022-06-14 DIAGNOSIS — D696 Thrombocytopenia, unspecified: Secondary | ICD-10-CM | POA: Diagnosis not present

## 2022-06-14 DIAGNOSIS — J439 Emphysema, unspecified: Secondary | ICD-10-CM | POA: Diagnosis not present

## 2022-06-16 DIAGNOSIS — I2510 Atherosclerotic heart disease of native coronary artery without angina pectoris: Secondary | ICD-10-CM | POA: Diagnosis not present

## 2022-06-16 DIAGNOSIS — N401 Enlarged prostate with lower urinary tract symptoms: Secondary | ICD-10-CM | POA: Diagnosis not present

## 2022-06-16 DIAGNOSIS — N1832 Chronic kidney disease, stage 3b: Secondary | ICD-10-CM | POA: Diagnosis not present

## 2022-06-16 DIAGNOSIS — Z466 Encounter for fitting and adjustment of urinary device: Secondary | ICD-10-CM | POA: Diagnosis not present

## 2022-06-16 DIAGNOSIS — I440 Atrioventricular block, first degree: Secondary | ICD-10-CM | POA: Diagnosis not present

## 2022-06-16 DIAGNOSIS — H409 Unspecified glaucoma: Secondary | ICD-10-CM | POA: Diagnosis not present

## 2022-06-16 DIAGNOSIS — K219 Gastro-esophageal reflux disease without esophagitis: Secondary | ICD-10-CM | POA: Diagnosis not present

## 2022-06-16 DIAGNOSIS — E785 Hyperlipidemia, unspecified: Secondary | ICD-10-CM | POA: Diagnosis not present

## 2022-06-16 DIAGNOSIS — J449 Chronic obstructive pulmonary disease, unspecified: Secondary | ICD-10-CM | POA: Diagnosis not present

## 2022-06-16 DIAGNOSIS — R338 Other retention of urine: Secondary | ICD-10-CM | POA: Diagnosis not present

## 2022-06-16 DIAGNOSIS — J439 Emphysema, unspecified: Secondary | ICD-10-CM | POA: Diagnosis not present

## 2022-06-16 DIAGNOSIS — I129 Hypertensive chronic kidney disease with stage 1 through stage 4 chronic kidney disease, or unspecified chronic kidney disease: Secondary | ICD-10-CM | POA: Diagnosis not present

## 2022-06-16 DIAGNOSIS — S72142D Displaced intertrochanteric fracture of left femur, subsequent encounter for closed fracture with routine healing: Secondary | ICD-10-CM | POA: Diagnosis not present

## 2022-06-16 DIAGNOSIS — F32A Depression, unspecified: Secondary | ICD-10-CM | POA: Diagnosis not present

## 2022-06-16 DIAGNOSIS — D696 Thrombocytopenia, unspecified: Secondary | ICD-10-CM | POA: Diagnosis not present

## 2022-06-16 DIAGNOSIS — N529 Male erectile dysfunction, unspecified: Secondary | ICD-10-CM | POA: Diagnosis not present

## 2022-06-19 DIAGNOSIS — N1832 Chronic kidney disease, stage 3b: Secondary | ICD-10-CM | POA: Diagnosis not present

## 2022-06-19 DIAGNOSIS — K219 Gastro-esophageal reflux disease without esophagitis: Secondary | ICD-10-CM | POA: Diagnosis not present

## 2022-06-19 DIAGNOSIS — S72142D Displaced intertrochanteric fracture of left femur, subsequent encounter for closed fracture with routine healing: Secondary | ICD-10-CM | POA: Diagnosis not present

## 2022-06-19 DIAGNOSIS — R338 Other retention of urine: Secondary | ICD-10-CM | POA: Diagnosis not present

## 2022-06-19 DIAGNOSIS — I2510 Atherosclerotic heart disease of native coronary artery without angina pectoris: Secondary | ICD-10-CM | POA: Diagnosis not present

## 2022-06-19 DIAGNOSIS — N401 Enlarged prostate with lower urinary tract symptoms: Secondary | ICD-10-CM | POA: Diagnosis not present

## 2022-06-19 DIAGNOSIS — J439 Emphysema, unspecified: Secondary | ICD-10-CM | POA: Diagnosis not present

## 2022-06-19 DIAGNOSIS — N529 Male erectile dysfunction, unspecified: Secondary | ICD-10-CM | POA: Diagnosis not present

## 2022-06-19 DIAGNOSIS — H409 Unspecified glaucoma: Secondary | ICD-10-CM | POA: Diagnosis not present

## 2022-06-19 DIAGNOSIS — I129 Hypertensive chronic kidney disease with stage 1 through stage 4 chronic kidney disease, or unspecified chronic kidney disease: Secondary | ICD-10-CM | POA: Diagnosis not present

## 2022-06-19 DIAGNOSIS — I440 Atrioventricular block, first degree: Secondary | ICD-10-CM | POA: Diagnosis not present

## 2022-06-19 DIAGNOSIS — J449 Chronic obstructive pulmonary disease, unspecified: Secondary | ICD-10-CM | POA: Diagnosis not present

## 2022-06-19 DIAGNOSIS — Z466 Encounter for fitting and adjustment of urinary device: Secondary | ICD-10-CM | POA: Diagnosis not present

## 2022-06-19 DIAGNOSIS — E785 Hyperlipidemia, unspecified: Secondary | ICD-10-CM | POA: Diagnosis not present

## 2022-06-19 DIAGNOSIS — D696 Thrombocytopenia, unspecified: Secondary | ICD-10-CM | POA: Diagnosis not present

## 2022-06-19 DIAGNOSIS — F32A Depression, unspecified: Secondary | ICD-10-CM | POA: Diagnosis not present

## 2022-06-21 DIAGNOSIS — J449 Chronic obstructive pulmonary disease, unspecified: Secondary | ICD-10-CM | POA: Diagnosis not present

## 2022-06-21 DIAGNOSIS — J439 Emphysema, unspecified: Secondary | ICD-10-CM | POA: Diagnosis not present

## 2022-06-21 DIAGNOSIS — D696 Thrombocytopenia, unspecified: Secondary | ICD-10-CM | POA: Diagnosis not present

## 2022-06-21 DIAGNOSIS — S72142D Displaced intertrochanteric fracture of left femur, subsequent encounter for closed fracture with routine healing: Secondary | ICD-10-CM | POA: Diagnosis not present

## 2022-06-22 DIAGNOSIS — Z466 Encounter for fitting and adjustment of urinary device: Secondary | ICD-10-CM | POA: Diagnosis not present

## 2022-06-22 DIAGNOSIS — J449 Chronic obstructive pulmonary disease, unspecified: Secondary | ICD-10-CM | POA: Diagnosis not present

## 2022-06-22 DIAGNOSIS — I2510 Atherosclerotic heart disease of native coronary artery without angina pectoris: Secondary | ICD-10-CM | POA: Diagnosis not present

## 2022-06-22 DIAGNOSIS — J439 Emphysema, unspecified: Secondary | ICD-10-CM | POA: Diagnosis not present

## 2022-06-22 DIAGNOSIS — H409 Unspecified glaucoma: Secondary | ICD-10-CM | POA: Diagnosis not present

## 2022-06-22 DIAGNOSIS — D696 Thrombocytopenia, unspecified: Secondary | ICD-10-CM | POA: Diagnosis not present

## 2022-06-22 DIAGNOSIS — N401 Enlarged prostate with lower urinary tract symptoms: Secondary | ICD-10-CM | POA: Diagnosis not present

## 2022-06-22 DIAGNOSIS — N1832 Chronic kidney disease, stage 3b: Secondary | ICD-10-CM | POA: Diagnosis not present

## 2022-06-22 DIAGNOSIS — R338 Other retention of urine: Secondary | ICD-10-CM | POA: Diagnosis not present

## 2022-06-22 DIAGNOSIS — K219 Gastro-esophageal reflux disease without esophagitis: Secondary | ICD-10-CM | POA: Diagnosis not present

## 2022-06-22 DIAGNOSIS — E785 Hyperlipidemia, unspecified: Secondary | ICD-10-CM | POA: Diagnosis not present

## 2022-06-22 DIAGNOSIS — N529 Male erectile dysfunction, unspecified: Secondary | ICD-10-CM | POA: Diagnosis not present

## 2022-06-22 DIAGNOSIS — I129 Hypertensive chronic kidney disease with stage 1 through stage 4 chronic kidney disease, or unspecified chronic kidney disease: Secondary | ICD-10-CM | POA: Diagnosis not present

## 2022-06-22 DIAGNOSIS — S72142D Displaced intertrochanteric fracture of left femur, subsequent encounter for closed fracture with routine healing: Secondary | ICD-10-CM | POA: Diagnosis not present

## 2022-06-22 DIAGNOSIS — F32A Depression, unspecified: Secondary | ICD-10-CM | POA: Diagnosis not present

## 2022-06-22 DIAGNOSIS — I440 Atrioventricular block, first degree: Secondary | ICD-10-CM | POA: Diagnosis not present

## 2022-06-23 DIAGNOSIS — F32A Depression, unspecified: Secondary | ICD-10-CM | POA: Diagnosis not present

## 2022-06-23 DIAGNOSIS — J449 Chronic obstructive pulmonary disease, unspecified: Secondary | ICD-10-CM | POA: Diagnosis not present

## 2022-06-23 DIAGNOSIS — D696 Thrombocytopenia, unspecified: Secondary | ICD-10-CM | POA: Diagnosis not present

## 2022-06-23 DIAGNOSIS — I2510 Atherosclerotic heart disease of native coronary artery without angina pectoris: Secondary | ICD-10-CM | POA: Diagnosis not present

## 2022-06-23 DIAGNOSIS — N401 Enlarged prostate with lower urinary tract symptoms: Secondary | ICD-10-CM | POA: Diagnosis not present

## 2022-06-23 DIAGNOSIS — N529 Male erectile dysfunction, unspecified: Secondary | ICD-10-CM | POA: Diagnosis not present

## 2022-06-23 DIAGNOSIS — S72142D Displaced intertrochanteric fracture of left femur, subsequent encounter for closed fracture with routine healing: Secondary | ICD-10-CM | POA: Diagnosis not present

## 2022-06-23 DIAGNOSIS — E785 Hyperlipidemia, unspecified: Secondary | ICD-10-CM | POA: Diagnosis not present

## 2022-06-23 DIAGNOSIS — N1832 Chronic kidney disease, stage 3b: Secondary | ICD-10-CM | POA: Diagnosis not present

## 2022-06-23 DIAGNOSIS — Z466 Encounter for fitting and adjustment of urinary device: Secondary | ICD-10-CM | POA: Diagnosis not present

## 2022-06-23 DIAGNOSIS — I129 Hypertensive chronic kidney disease with stage 1 through stage 4 chronic kidney disease, or unspecified chronic kidney disease: Secondary | ICD-10-CM | POA: Diagnosis not present

## 2022-06-23 DIAGNOSIS — K219 Gastro-esophageal reflux disease without esophagitis: Secondary | ICD-10-CM | POA: Diagnosis not present

## 2022-06-23 DIAGNOSIS — H409 Unspecified glaucoma: Secondary | ICD-10-CM | POA: Diagnosis not present

## 2022-06-23 DIAGNOSIS — R338 Other retention of urine: Secondary | ICD-10-CM | POA: Diagnosis not present

## 2022-06-23 DIAGNOSIS — I440 Atrioventricular block, first degree: Secondary | ICD-10-CM | POA: Diagnosis not present

## 2022-06-23 DIAGNOSIS — J439 Emphysema, unspecified: Secondary | ICD-10-CM | POA: Diagnosis not present

## 2022-06-26 DIAGNOSIS — Z466 Encounter for fitting and adjustment of urinary device: Secondary | ICD-10-CM | POA: Diagnosis not present

## 2022-06-26 DIAGNOSIS — I129 Hypertensive chronic kidney disease with stage 1 through stage 4 chronic kidney disease, or unspecified chronic kidney disease: Secondary | ICD-10-CM | POA: Diagnosis not present

## 2022-06-26 DIAGNOSIS — I2510 Atherosclerotic heart disease of native coronary artery without angina pectoris: Secondary | ICD-10-CM | POA: Diagnosis not present

## 2022-06-26 DIAGNOSIS — F32A Depression, unspecified: Secondary | ICD-10-CM | POA: Diagnosis not present

## 2022-06-26 DIAGNOSIS — D696 Thrombocytopenia, unspecified: Secondary | ICD-10-CM | POA: Diagnosis not present

## 2022-06-26 DIAGNOSIS — S72142D Displaced intertrochanteric fracture of left femur, subsequent encounter for closed fracture with routine healing: Secondary | ICD-10-CM | POA: Diagnosis not present

## 2022-06-26 DIAGNOSIS — J449 Chronic obstructive pulmonary disease, unspecified: Secondary | ICD-10-CM | POA: Diagnosis not present

## 2022-06-26 DIAGNOSIS — H409 Unspecified glaucoma: Secondary | ICD-10-CM | POA: Diagnosis not present

## 2022-06-26 DIAGNOSIS — E785 Hyperlipidemia, unspecified: Secondary | ICD-10-CM | POA: Diagnosis not present

## 2022-06-26 DIAGNOSIS — I440 Atrioventricular block, first degree: Secondary | ICD-10-CM | POA: Diagnosis not present

## 2022-06-26 DIAGNOSIS — K219 Gastro-esophageal reflux disease without esophagitis: Secondary | ICD-10-CM | POA: Diagnosis not present

## 2022-06-26 DIAGNOSIS — R338 Other retention of urine: Secondary | ICD-10-CM | POA: Diagnosis not present

## 2022-06-26 DIAGNOSIS — J439 Emphysema, unspecified: Secondary | ICD-10-CM | POA: Diagnosis not present

## 2022-06-26 DIAGNOSIS — N401 Enlarged prostate with lower urinary tract symptoms: Secondary | ICD-10-CM | POA: Diagnosis not present

## 2022-06-26 DIAGNOSIS — N529 Male erectile dysfunction, unspecified: Secondary | ICD-10-CM | POA: Diagnosis not present

## 2022-06-26 DIAGNOSIS — N1832 Chronic kidney disease, stage 3b: Secondary | ICD-10-CM | POA: Diagnosis not present

## 2022-06-27 DIAGNOSIS — J449 Chronic obstructive pulmonary disease, unspecified: Secondary | ICD-10-CM | POA: Diagnosis not present

## 2022-06-27 DIAGNOSIS — N1832 Chronic kidney disease, stage 3b: Secondary | ICD-10-CM | POA: Diagnosis not present

## 2022-06-27 DIAGNOSIS — F32A Depression, unspecified: Secondary | ICD-10-CM | POA: Diagnosis not present

## 2022-06-27 DIAGNOSIS — I2510 Atherosclerotic heart disease of native coronary artery without angina pectoris: Secondary | ICD-10-CM | POA: Diagnosis not present

## 2022-06-27 DIAGNOSIS — Z466 Encounter for fitting and adjustment of urinary device: Secondary | ICD-10-CM | POA: Diagnosis not present

## 2022-06-27 DIAGNOSIS — R338 Other retention of urine: Secondary | ICD-10-CM | POA: Diagnosis not present

## 2022-06-27 DIAGNOSIS — D696 Thrombocytopenia, unspecified: Secondary | ICD-10-CM | POA: Diagnosis not present

## 2022-06-27 DIAGNOSIS — S72142D Displaced intertrochanteric fracture of left femur, subsequent encounter for closed fracture with routine healing: Secondary | ICD-10-CM | POA: Diagnosis not present

## 2022-06-27 DIAGNOSIS — I440 Atrioventricular block, first degree: Secondary | ICD-10-CM | POA: Diagnosis not present

## 2022-06-27 DIAGNOSIS — N529 Male erectile dysfunction, unspecified: Secondary | ICD-10-CM | POA: Diagnosis not present

## 2022-06-27 DIAGNOSIS — K219 Gastro-esophageal reflux disease without esophagitis: Secondary | ICD-10-CM | POA: Diagnosis not present

## 2022-06-27 DIAGNOSIS — I129 Hypertensive chronic kidney disease with stage 1 through stage 4 chronic kidney disease, or unspecified chronic kidney disease: Secondary | ICD-10-CM | POA: Diagnosis not present

## 2022-06-27 DIAGNOSIS — E785 Hyperlipidemia, unspecified: Secondary | ICD-10-CM | POA: Diagnosis not present

## 2022-06-27 DIAGNOSIS — H409 Unspecified glaucoma: Secondary | ICD-10-CM | POA: Diagnosis not present

## 2022-06-27 DIAGNOSIS — J439 Emphysema, unspecified: Secondary | ICD-10-CM | POA: Diagnosis not present

## 2022-06-27 DIAGNOSIS — N401 Enlarged prostate with lower urinary tract symptoms: Secondary | ICD-10-CM | POA: Diagnosis not present

## 2022-06-29 DIAGNOSIS — R338 Other retention of urine: Secondary | ICD-10-CM | POA: Diagnosis not present

## 2022-07-03 DIAGNOSIS — Z466 Encounter for fitting and adjustment of urinary device: Secondary | ICD-10-CM | POA: Diagnosis not present

## 2022-07-03 DIAGNOSIS — D696 Thrombocytopenia, unspecified: Secondary | ICD-10-CM | POA: Diagnosis not present

## 2022-07-03 DIAGNOSIS — N529 Male erectile dysfunction, unspecified: Secondary | ICD-10-CM | POA: Diagnosis not present

## 2022-07-03 DIAGNOSIS — I129 Hypertensive chronic kidney disease with stage 1 through stage 4 chronic kidney disease, or unspecified chronic kidney disease: Secondary | ICD-10-CM | POA: Diagnosis not present

## 2022-07-03 DIAGNOSIS — F32A Depression, unspecified: Secondary | ICD-10-CM | POA: Diagnosis not present

## 2022-07-03 DIAGNOSIS — E785 Hyperlipidemia, unspecified: Secondary | ICD-10-CM | POA: Diagnosis not present

## 2022-07-03 DIAGNOSIS — H409 Unspecified glaucoma: Secondary | ICD-10-CM | POA: Diagnosis not present

## 2022-07-03 DIAGNOSIS — N1832 Chronic kidney disease, stage 3b: Secondary | ICD-10-CM | POA: Diagnosis not present

## 2022-07-03 DIAGNOSIS — S72142D Displaced intertrochanteric fracture of left femur, subsequent encounter for closed fracture with routine healing: Secondary | ICD-10-CM | POA: Diagnosis not present

## 2022-07-03 DIAGNOSIS — I440 Atrioventricular block, first degree: Secondary | ICD-10-CM | POA: Diagnosis not present

## 2022-07-03 DIAGNOSIS — K219 Gastro-esophageal reflux disease without esophagitis: Secondary | ICD-10-CM | POA: Diagnosis not present

## 2022-07-03 DIAGNOSIS — J439 Emphysema, unspecified: Secondary | ICD-10-CM | POA: Diagnosis not present

## 2022-07-03 DIAGNOSIS — N401 Enlarged prostate with lower urinary tract symptoms: Secondary | ICD-10-CM | POA: Diagnosis not present

## 2022-07-03 DIAGNOSIS — R338 Other retention of urine: Secondary | ICD-10-CM | POA: Diagnosis not present

## 2022-07-03 DIAGNOSIS — I2510 Atherosclerotic heart disease of native coronary artery without angina pectoris: Secondary | ICD-10-CM | POA: Diagnosis not present

## 2022-07-03 DIAGNOSIS — J449 Chronic obstructive pulmonary disease, unspecified: Secondary | ICD-10-CM | POA: Diagnosis not present

## 2022-07-05 DIAGNOSIS — M8580 Other specified disorders of bone density and structure, unspecified site: Secondary | ICD-10-CM | POA: Diagnosis not present

## 2022-07-05 DIAGNOSIS — K219 Gastro-esophageal reflux disease without esophagitis: Secondary | ICD-10-CM | POA: Diagnosis not present

## 2022-07-05 DIAGNOSIS — M109 Gout, unspecified: Secondary | ICD-10-CM | POA: Diagnosis not present

## 2022-07-05 DIAGNOSIS — Z466 Encounter for fitting and adjustment of urinary device: Secondary | ICD-10-CM | POA: Diagnosis not present

## 2022-07-05 DIAGNOSIS — F1011 Alcohol abuse, in remission: Secondary | ICD-10-CM | POA: Diagnosis not present

## 2022-07-05 DIAGNOSIS — E039 Hypothyroidism, unspecified: Secondary | ICD-10-CM | POA: Diagnosis not present

## 2022-07-05 DIAGNOSIS — I2510 Atherosclerotic heart disease of native coronary artery without angina pectoris: Secondary | ICD-10-CM | POA: Diagnosis not present

## 2022-07-05 DIAGNOSIS — E785 Hyperlipidemia, unspecified: Secondary | ICD-10-CM | POA: Diagnosis not present

## 2022-07-05 DIAGNOSIS — H3530 Unspecified macular degeneration: Secondary | ICD-10-CM | POA: Diagnosis not present

## 2022-07-05 DIAGNOSIS — I129 Hypertensive chronic kidney disease with stage 1 through stage 4 chronic kidney disease, or unspecified chronic kidney disease: Secondary | ICD-10-CM | POA: Diagnosis not present

## 2022-07-05 DIAGNOSIS — J439 Emphysema, unspecified: Secondary | ICD-10-CM | POA: Diagnosis not present

## 2022-07-05 DIAGNOSIS — G4733 Obstructive sleep apnea (adult) (pediatric): Secondary | ICD-10-CM | POA: Diagnosis not present

## 2022-07-05 DIAGNOSIS — N401 Enlarged prostate with lower urinary tract symptoms: Secondary | ICD-10-CM | POA: Diagnosis not present

## 2022-07-05 DIAGNOSIS — G629 Polyneuropathy, unspecified: Secondary | ICD-10-CM | POA: Diagnosis not present

## 2022-07-05 DIAGNOSIS — H409 Unspecified glaucoma: Secondary | ICD-10-CM | POA: Diagnosis not present

## 2022-07-05 DIAGNOSIS — J449 Chronic obstructive pulmonary disease, unspecified: Secondary | ICD-10-CM | POA: Diagnosis not present

## 2022-07-05 DIAGNOSIS — N62 Hypertrophy of breast: Secondary | ICD-10-CM | POA: Diagnosis not present

## 2022-07-05 DIAGNOSIS — F32A Depression, unspecified: Secondary | ICD-10-CM | POA: Diagnosis not present

## 2022-07-05 DIAGNOSIS — I440 Atrioventricular block, first degree: Secondary | ICD-10-CM | POA: Diagnosis not present

## 2022-07-05 DIAGNOSIS — N1832 Chronic kidney disease, stage 3b: Secondary | ICD-10-CM | POA: Diagnosis not present

## 2022-07-05 DIAGNOSIS — R338 Other retention of urine: Secondary | ICD-10-CM | POA: Diagnosis not present

## 2022-07-05 DIAGNOSIS — N529 Male erectile dysfunction, unspecified: Secondary | ICD-10-CM | POA: Diagnosis not present

## 2022-07-05 DIAGNOSIS — S72142D Displaced intertrochanteric fracture of left femur, subsequent encounter for closed fracture with routine healing: Secondary | ICD-10-CM | POA: Diagnosis not present

## 2022-07-05 DIAGNOSIS — D696 Thrombocytopenia, unspecified: Secondary | ICD-10-CM | POA: Diagnosis not present

## 2022-07-10 DIAGNOSIS — K219 Gastro-esophageal reflux disease without esophagitis: Secondary | ICD-10-CM | POA: Diagnosis not present

## 2022-07-10 DIAGNOSIS — F32A Depression, unspecified: Secondary | ICD-10-CM | POA: Diagnosis not present

## 2022-07-10 DIAGNOSIS — I440 Atrioventricular block, first degree: Secondary | ICD-10-CM | POA: Diagnosis not present

## 2022-07-10 DIAGNOSIS — R338 Other retention of urine: Secondary | ICD-10-CM | POA: Diagnosis not present

## 2022-07-10 DIAGNOSIS — E785 Hyperlipidemia, unspecified: Secondary | ICD-10-CM | POA: Diagnosis not present

## 2022-07-10 DIAGNOSIS — N401 Enlarged prostate with lower urinary tract symptoms: Secondary | ICD-10-CM | POA: Diagnosis not present

## 2022-07-10 DIAGNOSIS — D696 Thrombocytopenia, unspecified: Secondary | ICD-10-CM | POA: Diagnosis not present

## 2022-07-10 DIAGNOSIS — S72142D Displaced intertrochanteric fracture of left femur, subsequent encounter for closed fracture with routine healing: Secondary | ICD-10-CM | POA: Diagnosis not present

## 2022-07-10 DIAGNOSIS — I129 Hypertensive chronic kidney disease with stage 1 through stage 4 chronic kidney disease, or unspecified chronic kidney disease: Secondary | ICD-10-CM | POA: Diagnosis not present

## 2022-07-10 DIAGNOSIS — J439 Emphysema, unspecified: Secondary | ICD-10-CM | POA: Diagnosis not present

## 2022-07-10 DIAGNOSIS — H409 Unspecified glaucoma: Secondary | ICD-10-CM | POA: Diagnosis not present

## 2022-07-10 DIAGNOSIS — I2510 Atherosclerotic heart disease of native coronary artery without angina pectoris: Secondary | ICD-10-CM | POA: Diagnosis not present

## 2022-07-10 DIAGNOSIS — N1832 Chronic kidney disease, stage 3b: Secondary | ICD-10-CM | POA: Diagnosis not present

## 2022-07-10 DIAGNOSIS — Z466 Encounter for fitting and adjustment of urinary device: Secondary | ICD-10-CM | POA: Diagnosis not present

## 2022-07-10 DIAGNOSIS — J449 Chronic obstructive pulmonary disease, unspecified: Secondary | ICD-10-CM | POA: Diagnosis not present

## 2022-07-10 DIAGNOSIS — N529 Male erectile dysfunction, unspecified: Secondary | ICD-10-CM | POA: Diagnosis not present

## 2022-07-12 DIAGNOSIS — R338 Other retention of urine: Secondary | ICD-10-CM | POA: Diagnosis not present

## 2022-07-12 DIAGNOSIS — N529 Male erectile dysfunction, unspecified: Secondary | ICD-10-CM | POA: Diagnosis not present

## 2022-07-12 DIAGNOSIS — F32A Depression, unspecified: Secondary | ICD-10-CM | POA: Diagnosis not present

## 2022-07-12 DIAGNOSIS — J449 Chronic obstructive pulmonary disease, unspecified: Secondary | ICD-10-CM | POA: Diagnosis not present

## 2022-07-12 DIAGNOSIS — Z466 Encounter for fitting and adjustment of urinary device: Secondary | ICD-10-CM | POA: Diagnosis not present

## 2022-07-12 DIAGNOSIS — N1832 Chronic kidney disease, stage 3b: Secondary | ICD-10-CM | POA: Diagnosis not present

## 2022-07-12 DIAGNOSIS — N401 Enlarged prostate with lower urinary tract symptoms: Secondary | ICD-10-CM | POA: Diagnosis not present

## 2022-07-12 DIAGNOSIS — I129 Hypertensive chronic kidney disease with stage 1 through stage 4 chronic kidney disease, or unspecified chronic kidney disease: Secondary | ICD-10-CM | POA: Diagnosis not present

## 2022-07-12 DIAGNOSIS — I440 Atrioventricular block, first degree: Secondary | ICD-10-CM | POA: Diagnosis not present

## 2022-07-12 DIAGNOSIS — K219 Gastro-esophageal reflux disease without esophagitis: Secondary | ICD-10-CM | POA: Diagnosis not present

## 2022-07-12 DIAGNOSIS — J439 Emphysema, unspecified: Secondary | ICD-10-CM | POA: Diagnosis not present

## 2022-07-12 DIAGNOSIS — I2510 Atherosclerotic heart disease of native coronary artery without angina pectoris: Secondary | ICD-10-CM | POA: Diagnosis not present

## 2022-07-12 DIAGNOSIS — D696 Thrombocytopenia, unspecified: Secondary | ICD-10-CM | POA: Diagnosis not present

## 2022-07-12 DIAGNOSIS — H409 Unspecified glaucoma: Secondary | ICD-10-CM | POA: Diagnosis not present

## 2022-07-12 DIAGNOSIS — E785 Hyperlipidemia, unspecified: Secondary | ICD-10-CM | POA: Diagnosis not present

## 2022-07-12 DIAGNOSIS — S72142D Displaced intertrochanteric fracture of left femur, subsequent encounter for closed fracture with routine healing: Secondary | ICD-10-CM | POA: Diagnosis not present

## 2022-07-13 DIAGNOSIS — K219 Gastro-esophageal reflux disease without esophagitis: Secondary | ICD-10-CM | POA: Diagnosis not present

## 2022-07-13 DIAGNOSIS — I440 Atrioventricular block, first degree: Secondary | ICD-10-CM | POA: Diagnosis not present

## 2022-07-13 DIAGNOSIS — F32A Depression, unspecified: Secondary | ICD-10-CM | POA: Diagnosis not present

## 2022-07-13 DIAGNOSIS — N401 Enlarged prostate with lower urinary tract symptoms: Secondary | ICD-10-CM | POA: Diagnosis not present

## 2022-07-13 DIAGNOSIS — I2510 Atherosclerotic heart disease of native coronary artery without angina pectoris: Secondary | ICD-10-CM | POA: Diagnosis not present

## 2022-07-13 DIAGNOSIS — E785 Hyperlipidemia, unspecified: Secondary | ICD-10-CM | POA: Diagnosis not present

## 2022-07-13 DIAGNOSIS — N1832 Chronic kidney disease, stage 3b: Secondary | ICD-10-CM | POA: Diagnosis not present

## 2022-07-13 DIAGNOSIS — R338 Other retention of urine: Secondary | ICD-10-CM | POA: Diagnosis not present

## 2022-07-13 DIAGNOSIS — N529 Male erectile dysfunction, unspecified: Secondary | ICD-10-CM | POA: Diagnosis not present

## 2022-07-13 DIAGNOSIS — J449 Chronic obstructive pulmonary disease, unspecified: Secondary | ICD-10-CM | POA: Diagnosis not present

## 2022-07-13 DIAGNOSIS — H409 Unspecified glaucoma: Secondary | ICD-10-CM | POA: Diagnosis not present

## 2022-07-13 DIAGNOSIS — I129 Hypertensive chronic kidney disease with stage 1 through stage 4 chronic kidney disease, or unspecified chronic kidney disease: Secondary | ICD-10-CM | POA: Diagnosis not present

## 2022-07-13 DIAGNOSIS — Z466 Encounter for fitting and adjustment of urinary device: Secondary | ICD-10-CM | POA: Diagnosis not present

## 2022-07-13 DIAGNOSIS — D696 Thrombocytopenia, unspecified: Secondary | ICD-10-CM | POA: Diagnosis not present

## 2022-07-13 DIAGNOSIS — S72142D Displaced intertrochanteric fracture of left femur, subsequent encounter for closed fracture with routine healing: Secondary | ICD-10-CM | POA: Diagnosis not present

## 2022-07-13 DIAGNOSIS — J439 Emphysema, unspecified: Secondary | ICD-10-CM | POA: Diagnosis not present

## 2022-07-14 DIAGNOSIS — R338 Other retention of urine: Secondary | ICD-10-CM | POA: Diagnosis not present

## 2022-07-14 DIAGNOSIS — T83091A Other mechanical complication of indwelling urethral catheter, initial encounter: Secondary | ICD-10-CM | POA: Diagnosis not present

## 2022-07-17 DIAGNOSIS — H409 Unspecified glaucoma: Secondary | ICD-10-CM | POA: Diagnosis not present

## 2022-07-17 DIAGNOSIS — J439 Emphysema, unspecified: Secondary | ICD-10-CM | POA: Diagnosis not present

## 2022-07-17 DIAGNOSIS — J449 Chronic obstructive pulmonary disease, unspecified: Secondary | ICD-10-CM | POA: Diagnosis not present

## 2022-07-17 DIAGNOSIS — I2510 Atherosclerotic heart disease of native coronary artery without angina pectoris: Secondary | ICD-10-CM | POA: Diagnosis not present

## 2022-07-17 DIAGNOSIS — N529 Male erectile dysfunction, unspecified: Secondary | ICD-10-CM | POA: Diagnosis not present

## 2022-07-17 DIAGNOSIS — F32A Depression, unspecified: Secondary | ICD-10-CM | POA: Diagnosis not present

## 2022-07-17 DIAGNOSIS — Z466 Encounter for fitting and adjustment of urinary device: Secondary | ICD-10-CM | POA: Diagnosis not present

## 2022-07-17 DIAGNOSIS — I440 Atrioventricular block, first degree: Secondary | ICD-10-CM | POA: Diagnosis not present

## 2022-07-17 DIAGNOSIS — S72142D Displaced intertrochanteric fracture of left femur, subsequent encounter for closed fracture with routine healing: Secondary | ICD-10-CM | POA: Diagnosis not present

## 2022-07-17 DIAGNOSIS — I129 Hypertensive chronic kidney disease with stage 1 through stage 4 chronic kidney disease, or unspecified chronic kidney disease: Secondary | ICD-10-CM | POA: Diagnosis not present

## 2022-07-17 DIAGNOSIS — K219 Gastro-esophageal reflux disease without esophagitis: Secondary | ICD-10-CM | POA: Diagnosis not present

## 2022-07-17 DIAGNOSIS — R338 Other retention of urine: Secondary | ICD-10-CM | POA: Diagnosis not present

## 2022-07-17 DIAGNOSIS — N401 Enlarged prostate with lower urinary tract symptoms: Secondary | ICD-10-CM | POA: Diagnosis not present

## 2022-07-17 DIAGNOSIS — E785 Hyperlipidemia, unspecified: Secondary | ICD-10-CM | POA: Diagnosis not present

## 2022-07-17 DIAGNOSIS — D696 Thrombocytopenia, unspecified: Secondary | ICD-10-CM | POA: Diagnosis not present

## 2022-07-17 DIAGNOSIS — N1832 Chronic kidney disease, stage 3b: Secondary | ICD-10-CM | POA: Diagnosis not present

## 2022-07-18 ENCOUNTER — Telehealth: Payer: Self-pay | Admitting: *Deleted

## 2022-07-18 NOTE — Patient Outreach (Signed)
  Care Coordination   07/18/2022 Name: Timothy Spencer MRN: [id number] DOB: [birth year]   Care Coordination Outreach Attempts:  An unsuccessful telephone outreach was attempted today to offer the patient information about available care coordination services as a benefit of their health plan.   Follow Up Plan:  Additional outreach attempts will be made to offer the patient care coordination information and services.   Encounter Outcome:  No Answer   Care Coordination Interventions:  No, not indicated    Eduard Clos, MSW, [place] Worker [facility] [phone]

## 2022-07-19 DIAGNOSIS — Z466 Encounter for fitting and adjustment of urinary device: Secondary | ICD-10-CM | POA: Diagnosis not present

## 2022-07-19 DIAGNOSIS — K219 Gastro-esophageal reflux disease without esophagitis: Secondary | ICD-10-CM | POA: Diagnosis not present

## 2022-07-19 DIAGNOSIS — J449 Chronic obstructive pulmonary disease, unspecified: Secondary | ICD-10-CM | POA: Diagnosis not present

## 2022-07-19 DIAGNOSIS — S72142D Displaced intertrochanteric fracture of left femur, subsequent encounter for closed fracture with routine healing: Secondary | ICD-10-CM | POA: Diagnosis not present

## 2022-07-19 DIAGNOSIS — R338 Other retention of urine: Secondary | ICD-10-CM | POA: Diagnosis not present

## 2022-07-19 DIAGNOSIS — E785 Hyperlipidemia, unspecified: Secondary | ICD-10-CM | POA: Diagnosis not present

## 2022-07-19 DIAGNOSIS — J439 Emphysema, unspecified: Secondary | ICD-10-CM | POA: Diagnosis not present

## 2022-07-19 DIAGNOSIS — F32A Depression, unspecified: Secondary | ICD-10-CM | POA: Diagnosis not present

## 2022-07-19 DIAGNOSIS — D696 Thrombocytopenia, unspecified: Secondary | ICD-10-CM | POA: Diagnosis not present

## 2022-07-19 DIAGNOSIS — I440 Atrioventricular block, first degree: Secondary | ICD-10-CM | POA: Diagnosis not present

## 2022-07-19 DIAGNOSIS — I129 Hypertensive chronic kidney disease with stage 1 through stage 4 chronic kidney disease, or unspecified chronic kidney disease: Secondary | ICD-10-CM | POA: Diagnosis not present

## 2022-07-19 DIAGNOSIS — N1832 Chronic kidney disease, stage 3b: Secondary | ICD-10-CM | POA: Diagnosis not present

## 2022-07-19 DIAGNOSIS — I2510 Atherosclerotic heart disease of native coronary artery without angina pectoris: Secondary | ICD-10-CM | POA: Diagnosis not present

## 2022-07-19 DIAGNOSIS — N529 Male erectile dysfunction, unspecified: Secondary | ICD-10-CM | POA: Diagnosis not present

## 2022-07-19 DIAGNOSIS — N401 Enlarged prostate with lower urinary tract symptoms: Secondary | ICD-10-CM | POA: Diagnosis not present

## 2022-07-19 DIAGNOSIS — H409 Unspecified glaucoma: Secondary | ICD-10-CM | POA: Diagnosis not present

## 2022-07-20 DIAGNOSIS — I2510 Atherosclerotic heart disease of native coronary artery without angina pectoris: Secondary | ICD-10-CM | POA: Diagnosis not present

## 2022-07-20 DIAGNOSIS — I129 Hypertensive chronic kidney disease with stage 1 through stage 4 chronic kidney disease, or unspecified chronic kidney disease: Secondary | ICD-10-CM | POA: Diagnosis not present

## 2022-07-20 DIAGNOSIS — R338 Other retention of urine: Secondary | ICD-10-CM | POA: Diagnosis not present

## 2022-07-20 DIAGNOSIS — E785 Hyperlipidemia, unspecified: Secondary | ICD-10-CM | POA: Diagnosis not present

## 2022-07-20 DIAGNOSIS — F32A Depression, unspecified: Secondary | ICD-10-CM | POA: Diagnosis not present

## 2022-07-20 DIAGNOSIS — H409 Unspecified glaucoma: Secondary | ICD-10-CM | POA: Diagnosis not present

## 2022-07-20 DIAGNOSIS — S72142D Displaced intertrochanteric fracture of left femur, subsequent encounter for closed fracture with routine healing: Secondary | ICD-10-CM | POA: Diagnosis not present

## 2022-07-20 DIAGNOSIS — I440 Atrioventricular block, first degree: Secondary | ICD-10-CM | POA: Diagnosis not present

## 2022-07-20 DIAGNOSIS — J449 Chronic obstructive pulmonary disease, unspecified: Secondary | ICD-10-CM | POA: Diagnosis not present

## 2022-07-20 DIAGNOSIS — N529 Male erectile dysfunction, unspecified: Secondary | ICD-10-CM | POA: Diagnosis not present

## 2022-07-20 DIAGNOSIS — N1832 Chronic kidney disease, stage 3b: Secondary | ICD-10-CM | POA: Diagnosis not present

## 2022-07-20 DIAGNOSIS — D696 Thrombocytopenia, unspecified: Secondary | ICD-10-CM | POA: Diagnosis not present

## 2022-07-20 DIAGNOSIS — J439 Emphysema, unspecified: Secondary | ICD-10-CM | POA: Diagnosis not present

## 2022-07-20 DIAGNOSIS — N401 Enlarged prostate with lower urinary tract symptoms: Secondary | ICD-10-CM | POA: Diagnosis not present

## 2022-07-20 DIAGNOSIS — K219 Gastro-esophageal reflux disease without esophagitis: Secondary | ICD-10-CM | POA: Diagnosis not present

## 2022-07-20 DIAGNOSIS — Z466 Encounter for fitting and adjustment of urinary device: Secondary | ICD-10-CM | POA: Diagnosis not present

## 2022-07-21 DIAGNOSIS — H524 Presbyopia: Secondary | ICD-10-CM | POA: Diagnosis not present

## 2022-07-24 DIAGNOSIS — J449 Chronic obstructive pulmonary disease, unspecified: Secondary | ICD-10-CM | POA: Diagnosis not present

## 2022-07-24 DIAGNOSIS — K219 Gastro-esophageal reflux disease without esophagitis: Secondary | ICD-10-CM | POA: Diagnosis not present

## 2022-07-24 DIAGNOSIS — N1832 Chronic kidney disease, stage 3b: Secondary | ICD-10-CM | POA: Diagnosis not present

## 2022-07-24 DIAGNOSIS — S72142D Displaced intertrochanteric fracture of left femur, subsequent encounter for closed fracture with routine healing: Secondary | ICD-10-CM | POA: Diagnosis not present

## 2022-07-24 DIAGNOSIS — D696 Thrombocytopenia, unspecified: Secondary | ICD-10-CM | POA: Diagnosis not present

## 2022-07-24 DIAGNOSIS — N529 Male erectile dysfunction, unspecified: Secondary | ICD-10-CM | POA: Diagnosis not present

## 2022-07-24 DIAGNOSIS — E785 Hyperlipidemia, unspecified: Secondary | ICD-10-CM | POA: Diagnosis not present

## 2022-07-24 DIAGNOSIS — F32A Depression, unspecified: Secondary | ICD-10-CM | POA: Diagnosis not present

## 2022-07-24 DIAGNOSIS — I2510 Atherosclerotic heart disease of native coronary artery without angina pectoris: Secondary | ICD-10-CM | POA: Diagnosis not present

## 2022-07-24 DIAGNOSIS — H409 Unspecified glaucoma: Secondary | ICD-10-CM | POA: Diagnosis not present

## 2022-07-24 DIAGNOSIS — I129 Hypertensive chronic kidney disease with stage 1 through stage 4 chronic kidney disease, or unspecified chronic kidney disease: Secondary | ICD-10-CM | POA: Diagnosis not present

## 2022-07-24 DIAGNOSIS — R338 Other retention of urine: Secondary | ICD-10-CM | POA: Diagnosis not present

## 2022-07-24 DIAGNOSIS — N401 Enlarged prostate with lower urinary tract symptoms: Secondary | ICD-10-CM | POA: Diagnosis not present

## 2022-07-24 DIAGNOSIS — J439 Emphysema, unspecified: Secondary | ICD-10-CM | POA: Diagnosis not present

## 2022-07-24 DIAGNOSIS — Z466 Encounter for fitting and adjustment of urinary device: Secondary | ICD-10-CM | POA: Diagnosis not present

## 2022-07-24 DIAGNOSIS — I440 Atrioventricular block, first degree: Secondary | ICD-10-CM | POA: Diagnosis not present

## 2022-07-26 DIAGNOSIS — R338 Other retention of urine: Secondary | ICD-10-CM | POA: Diagnosis not present

## 2022-07-26 DIAGNOSIS — N401 Enlarged prostate with lower urinary tract symptoms: Secondary | ICD-10-CM | POA: Diagnosis not present

## 2022-07-27 DIAGNOSIS — Z466 Encounter for fitting and adjustment of urinary device: Secondary | ICD-10-CM | POA: Diagnosis not present

## 2022-07-27 DIAGNOSIS — J439 Emphysema, unspecified: Secondary | ICD-10-CM | POA: Diagnosis not present

## 2022-07-27 DIAGNOSIS — I2510 Atherosclerotic heart disease of native coronary artery without angina pectoris: Secondary | ICD-10-CM | POA: Diagnosis not present

## 2022-07-27 DIAGNOSIS — N1832 Chronic kidney disease, stage 3b: Secondary | ICD-10-CM | POA: Diagnosis not present

## 2022-07-27 DIAGNOSIS — I129 Hypertensive chronic kidney disease with stage 1 through stage 4 chronic kidney disease, or unspecified chronic kidney disease: Secondary | ICD-10-CM | POA: Diagnosis not present

## 2022-07-27 DIAGNOSIS — R338 Other retention of urine: Secondary | ICD-10-CM | POA: Diagnosis not present

## 2022-07-27 DIAGNOSIS — F32A Depression, unspecified: Secondary | ICD-10-CM | POA: Diagnosis not present

## 2022-07-27 DIAGNOSIS — S72142D Displaced intertrochanteric fracture of left femur, subsequent encounter for closed fracture with routine healing: Secondary | ICD-10-CM | POA: Diagnosis not present

## 2022-07-27 DIAGNOSIS — N401 Enlarged prostate with lower urinary tract symptoms: Secondary | ICD-10-CM | POA: Diagnosis not present

## 2022-07-27 DIAGNOSIS — J449 Chronic obstructive pulmonary disease, unspecified: Secondary | ICD-10-CM | POA: Diagnosis not present

## 2022-07-27 DIAGNOSIS — E785 Hyperlipidemia, unspecified: Secondary | ICD-10-CM | POA: Diagnosis not present

## 2022-07-27 DIAGNOSIS — J432 Centrilobular emphysema: Secondary | ICD-10-CM | POA: Diagnosis not present

## 2022-07-27 DIAGNOSIS — N529 Male erectile dysfunction, unspecified: Secondary | ICD-10-CM | POA: Diagnosis not present

## 2022-07-27 DIAGNOSIS — I10 Essential (primary) hypertension: Secondary | ICD-10-CM | POA: Diagnosis not present

## 2022-07-27 DIAGNOSIS — H409 Unspecified glaucoma: Secondary | ICD-10-CM | POA: Diagnosis not present

## 2022-07-27 DIAGNOSIS — I440 Atrioventricular block, first degree: Secondary | ICD-10-CM | POA: Diagnosis not present

## 2022-07-27 DIAGNOSIS — K219 Gastro-esophageal reflux disease without esophagitis: Secondary | ICD-10-CM | POA: Diagnosis not present

## 2022-07-27 DIAGNOSIS — D696 Thrombocytopenia, unspecified: Secondary | ICD-10-CM | POA: Diagnosis not present

## 2022-07-31 DIAGNOSIS — I440 Atrioventricular block, first degree: Secondary | ICD-10-CM | POA: Diagnosis not present

## 2022-07-31 DIAGNOSIS — N401 Enlarged prostate with lower urinary tract symptoms: Secondary | ICD-10-CM | POA: Diagnosis not present

## 2022-07-31 DIAGNOSIS — H409 Unspecified glaucoma: Secondary | ICD-10-CM | POA: Diagnosis not present

## 2022-07-31 DIAGNOSIS — F32A Depression, unspecified: Secondary | ICD-10-CM | POA: Diagnosis not present

## 2022-07-31 DIAGNOSIS — J449 Chronic obstructive pulmonary disease, unspecified: Secondary | ICD-10-CM | POA: Diagnosis not present

## 2022-07-31 DIAGNOSIS — E785 Hyperlipidemia, unspecified: Secondary | ICD-10-CM | POA: Diagnosis not present

## 2022-07-31 DIAGNOSIS — S72142D Displaced intertrochanteric fracture of left femur, subsequent encounter for closed fracture with routine healing: Secondary | ICD-10-CM | POA: Diagnosis not present

## 2022-07-31 DIAGNOSIS — J439 Emphysema, unspecified: Secondary | ICD-10-CM | POA: Diagnosis not present

## 2022-07-31 DIAGNOSIS — I2510 Atherosclerotic heart disease of native coronary artery without angina pectoris: Secondary | ICD-10-CM | POA: Diagnosis not present

## 2022-07-31 DIAGNOSIS — K219 Gastro-esophageal reflux disease without esophagitis: Secondary | ICD-10-CM | POA: Diagnosis not present

## 2022-07-31 DIAGNOSIS — N1832 Chronic kidney disease, stage 3b: Secondary | ICD-10-CM | POA: Diagnosis not present

## 2022-07-31 DIAGNOSIS — D696 Thrombocytopenia, unspecified: Secondary | ICD-10-CM | POA: Diagnosis not present

## 2022-07-31 DIAGNOSIS — N529 Male erectile dysfunction, unspecified: Secondary | ICD-10-CM | POA: Diagnosis not present

## 2022-07-31 DIAGNOSIS — I129 Hypertensive chronic kidney disease with stage 1 through stage 4 chronic kidney disease, or unspecified chronic kidney disease: Secondary | ICD-10-CM | POA: Diagnosis not present

## 2022-07-31 DIAGNOSIS — Z466 Encounter for fitting and adjustment of urinary device: Secondary | ICD-10-CM | POA: Diagnosis not present

## 2022-07-31 DIAGNOSIS — R338 Other retention of urine: Secondary | ICD-10-CM | POA: Diagnosis not present

## 2022-08-01 DIAGNOSIS — S72142D Displaced intertrochanteric fracture of left femur, subsequent encounter for closed fracture with routine healing: Secondary | ICD-10-CM | POA: Diagnosis not present

## 2022-08-02 ENCOUNTER — Telehealth: Payer: Self-pay | Admitting: *Deleted

## 2022-08-02 DIAGNOSIS — H409 Unspecified glaucoma: Secondary | ICD-10-CM | POA: Diagnosis not present

## 2022-08-02 DIAGNOSIS — S72142D Displaced intertrochanteric fracture of left femur, subsequent encounter for closed fracture with routine healing: Secondary | ICD-10-CM | POA: Diagnosis not present

## 2022-08-02 DIAGNOSIS — I2510 Atherosclerotic heart disease of native coronary artery without angina pectoris: Secondary | ICD-10-CM | POA: Diagnosis not present

## 2022-08-02 DIAGNOSIS — N529 Male erectile dysfunction, unspecified: Secondary | ICD-10-CM | POA: Diagnosis not present

## 2022-08-02 DIAGNOSIS — Z466 Encounter for fitting and adjustment of urinary device: Secondary | ICD-10-CM | POA: Diagnosis not present

## 2022-08-02 DIAGNOSIS — J449 Chronic obstructive pulmonary disease, unspecified: Secondary | ICD-10-CM | POA: Diagnosis not present

## 2022-08-02 DIAGNOSIS — I440 Atrioventricular block, first degree: Secondary | ICD-10-CM | POA: Diagnosis not present

## 2022-08-02 DIAGNOSIS — N401 Enlarged prostate with lower urinary tract symptoms: Secondary | ICD-10-CM | POA: Diagnosis not present

## 2022-08-02 DIAGNOSIS — J439 Emphysema, unspecified: Secondary | ICD-10-CM | POA: Diagnosis not present

## 2022-08-02 DIAGNOSIS — E785 Hyperlipidemia, unspecified: Secondary | ICD-10-CM | POA: Diagnosis not present

## 2022-08-02 DIAGNOSIS — I129 Hypertensive chronic kidney disease with stage 1 through stage 4 chronic kidney disease, or unspecified chronic kidney disease: Secondary | ICD-10-CM | POA: Diagnosis not present

## 2022-08-02 DIAGNOSIS — D696 Thrombocytopenia, unspecified: Secondary | ICD-10-CM | POA: Diagnosis not present

## 2022-08-02 DIAGNOSIS — K219 Gastro-esophageal reflux disease without esophagitis: Secondary | ICD-10-CM | POA: Diagnosis not present

## 2022-08-02 DIAGNOSIS — R338 Other retention of urine: Secondary | ICD-10-CM | POA: Diagnosis not present

## 2022-08-02 DIAGNOSIS — N1832 Chronic kidney disease, stage 3b: Secondary | ICD-10-CM | POA: Diagnosis not present

## 2022-08-02 DIAGNOSIS — F32A Depression, unspecified: Secondary | ICD-10-CM | POA: Diagnosis not present

## 2022-08-02 NOTE — Telephone Encounter (Signed)
I s/w the pt's wife  and she scheduled a tele pre op appt for the pt 08/09/22 @ 2 pm. Med rec and consent are done.

## 2022-08-02 NOTE — Telephone Encounter (Signed)
Pt wife returning call. 

## 2022-08-02 NOTE — Telephone Encounter (Signed)
   Name: ADRIN Spencer  DOB: 18-May-1941  MRN: [id number]  Primary Cardiologist: Shelva Majestic, MD   Preoperative team, please contact this patient and set up a phone call appointment for further preoperative risk assessment. Please obtain consent and complete medication review. Thank you for your help.  I confirm that guidance regarding antiplatelet and oral anticoagulation therapy has been completed and, if necessary, noted below.  None requested   Deberah Pelton, NP 08/02/2022, 11:47 AM [place]

## 2022-08-02 NOTE — Telephone Encounter (Signed)
   Pre-operative Risk Assessment    Patient Name: Timothy Spencer  DOB: 06-22-1940 MRN: [id number]      Request for Surgical Clearance    Procedure:   Left hip conversion IM nail to total hip arthroplasty (non union)  Date of Surgery:  Clearance 08/30/22                                 Surgeon:  Dr. Charlies Constable Surgeon's Group or Practice Name:  [facility] Phone number:  [phone] Fax number:  [phone]   Type of Clearance Requested:   - Medical    Type of Anesthesia:   Choice   Additional requests/questions:    Signed, Greer Ee   08/02/2022, 8:58 AM

## 2022-08-02 NOTE — Telephone Encounter (Signed)
Left a message to call back to schedule a tele pre op appt.

## 2022-08-02 NOTE — Telephone Encounter (Signed)
I s/w the pt's wife  and she scheduled a tele pre op appt for the pt 08/09/22 @ 2 pm. Med rec and consent are done.     Patient Consent for Virtual Visit        Timothy Spencer has provided verbal consent on 08/02/2022 for a virtual visit (video or telephone).   CONSENT FOR VIRTUAL VISIT FOR:  Timothy Spencer  By participating in this virtual visit I agree to the following:  I hereby voluntarily request, consent and authorize [facility] and its employed or contracted physicians, physician assistants, nurse practitioners or other licensed health care professionals (the Practitioner), to provide me with telemedicine health care services (the "Services") as deemed necessary by the treating Practitioner. I acknowledge and consent to receive the Services by the Practitioner via telemedicine. I understand that the telemedicine visit will involve communicating with the Practitioner through live audiovisual communication technology and the disclosure of certain medical information by electronic transmission. I acknowledge that I have been given the opportunity to request an in-person assessment or other available alternative prior to the telemedicine visit and am voluntarily participating in the telemedicine visit.  I understand that I have the right to withhold or withdraw my consent to the use of telemedicine in the course of my care at any time, without affecting my right to future care or treatment, and that the Practitioner or I may terminate the telemedicine visit at any time. I understand that I have the right to inspect all information obtained and/or recorded in the course of the telemedicine visit and may receive copies of available information for a reasonable fee.  I understand that some of the potential risks of receiving the Services via telemedicine include:  Delay or interruption in medical evaluation due to technological equipment failure or disruption; Information transmitted may  not be sufficient (e.g. poor resolution of images) to allow for appropriate medical decision making by the Practitioner; and/or  In rare instances, security protocols could fail, causing a breach of personal health information.  Furthermore, I acknowledge that it is my responsibility to provide information about my medical history, conditions and care that is complete and accurate to the best of my ability. I acknowledge that Practitioner's advice, recommendations, and/or decision may be based on factors not within their control, such as incomplete or inaccurate data provided by me or distortions of diagnostic images or specimens that may result from electronic transmissions. I understand that the practice of medicine is not an exact science and that Practitioner makes no warranties or guarantees regarding treatment outcomes. I acknowledge that a copy of this consent can be made available to me via my patient portal ([facility]), or I can request a printed copy by calling the office of [facility].    I understand that my insurance will be billed for this visit.   I have read or had this consent read to me. I understand the contents of this consent, which adequately explains the benefits and risks of the Services being provided via telemedicine.  I have been provided ample opportunity to ask questions regarding this consent and the Services and have had my questions answered to my satisfaction. I give my informed consent for the services to be provided through the use of telemedicine in my medical care

## 2022-08-03 ENCOUNTER — Observation Stay (HOSPITAL_COMMUNITY): Payer: Medicare Other

## 2022-08-03 ENCOUNTER — Inpatient Hospital Stay (HOSPITAL_COMMUNITY)
Admission: RE | Admit: 2022-08-03 | Discharge: 2022-08-09 | DRG: 982 | Disposition: A | Discharge status: Skilled Nursing Facility | Payer: Medicare Other | Source: Intra-hospital | Attending: Family Medicine | Admitting: Family Medicine

## 2022-08-03 ENCOUNTER — Encounter (HOSPITAL_COMMUNITY): Payer: Self-pay

## 2022-08-03 DIAGNOSIS — N390 Urinary tract infection, site not specified: Secondary | ICD-10-CM | POA: Diagnosis present

## 2022-08-03 DIAGNOSIS — G4733 Obstructive sleep apnea (adult) (pediatric): Secondary | ICD-10-CM | POA: Diagnosis not present

## 2022-08-03 DIAGNOSIS — R278 Other lack of coordination: Secondary | ICD-10-CM | POA: Diagnosis not present

## 2022-08-03 DIAGNOSIS — Z79899 Other long term (current) drug therapy: Secondary | ICD-10-CM

## 2022-08-03 DIAGNOSIS — R410 Disorientation, unspecified: Secondary | ICD-10-CM | POA: Diagnosis present

## 2022-08-03 DIAGNOSIS — N401 Enlarged prostate with lower urinary tract symptoms: Secondary | ICD-10-CM | POA: Diagnosis present

## 2022-08-03 DIAGNOSIS — G2581 Restless legs syndrome: Secondary | ICD-10-CM | POA: Diagnosis not present

## 2022-08-03 DIAGNOSIS — T84195D Other mechanical complication of internal fixation device of left femur, subsequent encounter: Secondary | ICD-10-CM | POA: Diagnosis not present

## 2022-08-03 DIAGNOSIS — W19XXXD Unspecified fall, subsequent encounter: Secondary | ICD-10-CM | POA: Diagnosis present

## 2022-08-03 DIAGNOSIS — F419 Anxiety disorder, unspecified: Secondary | ICD-10-CM | POA: Diagnosis not present

## 2022-08-03 DIAGNOSIS — Z471 Aftercare following joint replacement surgery: Secondary | ICD-10-CM | POA: Diagnosis not present

## 2022-08-03 DIAGNOSIS — G928 Other toxic encephalopathy: Principal | ICD-10-CM

## 2022-08-03 DIAGNOSIS — I129 Hypertensive chronic kidney disease with stage 1 through stage 4 chronic kidney disease, or unspecified chronic kidney disease: Secondary | ICD-10-CM | POA: Diagnosis not present

## 2022-08-03 DIAGNOSIS — Z148 Genetic carrier of other disease: Secondary | ICD-10-CM | POA: Diagnosis not present

## 2022-08-03 DIAGNOSIS — D696 Thrombocytopenia, unspecified: Secondary | ICD-10-CM | POA: Diagnosis not present

## 2022-08-03 DIAGNOSIS — H409 Unspecified glaucoma: Secondary | ICD-10-CM | POA: Diagnosis not present

## 2022-08-03 DIAGNOSIS — R2689 Other abnormalities of gait and mobility: Secondary | ICD-10-CM | POA: Diagnosis not present

## 2022-08-03 DIAGNOSIS — E785 Hyperlipidemia, unspecified: Secondary | ICD-10-CM | POA: Diagnosis present

## 2022-08-03 DIAGNOSIS — N1831 Chronic kidney disease, stage 3a: Secondary | ICD-10-CM | POA: Diagnosis present

## 2022-08-03 DIAGNOSIS — J441 Chronic obstructive pulmonary disease with (acute) exacerbation: Secondary | ICD-10-CM | POA: Diagnosis not present

## 2022-08-03 DIAGNOSIS — J439 Emphysema, unspecified: Secondary | ICD-10-CM | POA: Diagnosis present

## 2022-08-03 DIAGNOSIS — D631 Anemia in chronic kidney disease: Secondary | ICD-10-CM | POA: Diagnosis present

## 2022-08-03 DIAGNOSIS — G9341 Metabolic encephalopathy: Secondary | ICD-10-CM | POA: Diagnosis not present

## 2022-08-03 DIAGNOSIS — S72142K Displaced intertrochanteric fracture of left femur, subsequent encounter for closed fracture with nonunion: Secondary | ICD-10-CM | POA: Diagnosis not present

## 2022-08-03 DIAGNOSIS — E039 Hypothyroidism, unspecified: Secondary | ICD-10-CM | POA: Diagnosis present

## 2022-08-03 DIAGNOSIS — F32A Depression, unspecified: Secondary | ICD-10-CM | POA: Diagnosis present

## 2022-08-03 DIAGNOSIS — Z87891 Personal history of nicotine dependence: Secondary | ICD-10-CM | POA: Diagnosis not present

## 2022-08-03 DIAGNOSIS — Y846 Urinary catheterization as the cause of abnormal reaction of the patient, or of later complication, without mention of misadventure at the time of the procedure: Secondary | ICD-10-CM | POA: Diagnosis not present

## 2022-08-03 DIAGNOSIS — J9811 Atelectasis: Secondary | ICD-10-CM | POA: Diagnosis not present

## 2022-08-03 DIAGNOSIS — I4510 Unspecified right bundle-branch block: Secondary | ICD-10-CM | POA: Diagnosis not present

## 2022-08-03 DIAGNOSIS — Z743 Need for continuous supervision: Secondary | ICD-10-CM | POA: Diagnosis not present

## 2022-08-03 DIAGNOSIS — T83511S Infection and inflammatory reaction due to indwelling urethral catheter, sequela: Secondary | ICD-10-CM | POA: Diagnosis not present

## 2022-08-03 DIAGNOSIS — Z888 Allergy status to other drugs, medicaments and biological substances status: Secondary | ICD-10-CM

## 2022-08-03 DIAGNOSIS — M6281 Muscle weakness (generalized): Secondary | ICD-10-CM | POA: Diagnosis not present

## 2022-08-03 DIAGNOSIS — T83518A Infection and inflammatory reaction due to other urinary catheter, initial encounter: Secondary | ICD-10-CM | POA: Diagnosis not present

## 2022-08-03 DIAGNOSIS — K219 Gastro-esophageal reflux disease without esophagitis: Secondary | ICD-10-CM | POA: Diagnosis not present

## 2022-08-03 DIAGNOSIS — F339 Major depressive disorder, recurrent, unspecified: Secondary | ICD-10-CM | POA: Diagnosis not present

## 2022-08-03 DIAGNOSIS — I2510 Atherosclerotic heart disease of native coronary artery without angina pectoris: Secondary | ICD-10-CM | POA: Diagnosis present

## 2022-08-03 DIAGNOSIS — R531 Weakness: Secondary | ICD-10-CM | POA: Diagnosis not present

## 2022-08-03 DIAGNOSIS — J449 Chronic obstructive pulmonary disease, unspecified: Secondary | ICD-10-CM | POA: Diagnosis not present

## 2022-08-03 DIAGNOSIS — R2681 Unsteadiness on feet: Secondary | ICD-10-CM | POA: Diagnosis not present

## 2022-08-03 DIAGNOSIS — Z96642 Presence of left artificial hip joint: Secondary | ICD-10-CM | POA: Diagnosis not present

## 2022-08-03 DIAGNOSIS — E43 Unspecified severe protein-calorie malnutrition: Secondary | ICD-10-CM | POA: Diagnosis not present

## 2022-08-03 DIAGNOSIS — G9340 Encephalopathy, unspecified: Secondary | ICD-10-CM | POA: Diagnosis not present

## 2022-08-03 DIAGNOSIS — I10 Essential (primary) hypertension: Secondary | ICD-10-CM | POA: Diagnosis not present

## 2022-08-03 LAB — CBC WITH DIFFERENTIAL/PLATELET
Abs Immature Granulocytes: 0.01 10*3/uL (ref 0.00–0.07)
Basophils Absolute: 0 10*3/uL (ref 0.0–0.1)
Basophils Relative: 0 %
Eosinophils Absolute: 0.1 10*3/uL (ref 0.0–0.5)
Eosinophils Relative: 1 %
HCT: 39.2 % (ref 39.0–52.0)
Hemoglobin: 13.1 g/dL (ref 13.0–17.0)
Immature Granulocytes: 0 %
Lymphocytes Relative: 12 %
Lymphs Abs: 0.6 10*3/uL — ABNORMAL LOW (ref 0.7–4.0)
MCH: 30 pg (ref 26.0–34.0)
MCHC: 33.4 g/dL (ref 30.0–36.0)
MCV: 89.9 fL (ref 80.0–100.0)
Monocytes Absolute: 0.4 10*3/uL (ref 0.1–1.0)
Monocytes Relative: 8 %
Neutro Abs: 3.6 10*3/uL (ref 1.7–7.7)
Neutrophils Relative %: 79 %
Platelets: 117 10*3/uL — ABNORMAL LOW (ref 150–400)
RBC: 4.36 MIL/uL (ref 4.22–5.81)
RDW: 14.8 % (ref 11.5–15.5)
WBC: 4.7 10*3/uL (ref 4.0–10.5)
nRBC: 0 % (ref 0.0–0.2)

## 2022-08-03 LAB — COMPREHENSIVE METABOLIC PANEL
ALT: 9 U/L (ref 0–44)
AST: 16 U/L (ref 15–41)
Albumin: 2.9 g/dL — ABNORMAL LOW (ref 3.5–5.0)
Alkaline Phosphatase: 109 U/L (ref 38–126)
Anion gap: 15 (ref 5–15)
BUN: 25 mg/dL — ABNORMAL HIGH (ref 8–23)
CO2: 20 mmol/L — ABNORMAL LOW (ref 22–32)
Calcium: 8.7 mg/dL — ABNORMAL LOW (ref 8.9–10.3)
Chloride: 104 mmol/L (ref 98–111)
Creatinine, Ser: 1.67 mg/dL — ABNORMAL HIGH (ref 0.61–1.24)
GFR, Estimated: 41 mL/min — ABNORMAL LOW (ref >60)
Glucose, Bld: 107 mg/dL — ABNORMAL HIGH (ref 70–99)
Potassium: 4 mmol/L (ref 3.5–5.1)
Sodium: 139 mmol/L (ref 135–145)
Total Bilirubin: 1.1 mg/dL (ref 0.3–1.2)
Total Protein: 5.9 g/dL — ABNORMAL LOW (ref 6.5–8.1)

## 2022-08-03 LAB — URINALYSIS, ROUTINE W REFLEX MICROSCOPIC
Bilirubin Urine: NEGATIVE
Glucose, UA: NEGATIVE mg/dL
Ketones, ur: NEGATIVE mg/dL
Nitrite: POSITIVE — AB
Protein, ur: >=300 mg/dL — AB
Specific Gravity, Urine: 1.023 (ref 1.005–1.030)
WBC, UA: >50 WBC/hpf (ref 0–5)
pH: 7 (ref 5.0–8.0)

## 2022-08-03 LAB — C-REACTIVE PROTEIN: CRP: 8.6 mg/dL — ABNORMAL HIGH (ref <1.0)

## 2022-08-03 LAB — SEDIMENTATION RATE: Sed Rate: 49 mm/hr — ABNORMAL HIGH (ref 0–16)

## 2022-08-03 MED ORDER — TAMSULOSIN HCL 0.4 MG PO CAPS
0.4000 mg | ORAL_CAPSULE | Freq: Every day | ORAL | Status: DC
  Administered 2022-08-04 – 2022-08-09 (×5): 0.4 mg via ORAL
  Filled 2022-08-03 (×5): qty 1

## 2022-08-03 MED ORDER — ROSUVASTATIN CALCIUM 5 MG PO TABS
5.0000 mg | ORAL_TABLET | Freq: Every day | ORAL | Status: DC
  Administered 2022-08-04 – 2022-08-09 (×5): 5 mg via ORAL
  Filled 2022-08-03 (×5): qty 1

## 2022-08-03 MED ORDER — HYDRALAZINE HCL 20 MG/ML IJ SOLN: 5.0000 mg | INTRAMUSCULAR | Status: DC | PRN

## 2022-08-03 MED ORDER — ALBUTEROL SULFATE (2.5 MG/3ML) 0.083% IN NEBU: 2.5000 mg | INHALATION_SOLUTION | RESPIRATORY_TRACT | Status: DC | PRN

## 2022-08-03 MED ORDER — ROPINIROLE HCL 0.5 MG PO TABS
1.0000 mg | ORAL_TABLET | Freq: Three times a day (TID) | ORAL | Status: DC
  Administered 2022-08-03 – 2022-08-09 (×16): 1 mg via ORAL
  Filled 2022-08-03 (×16): qty 2

## 2022-08-03 MED ORDER — LATANOPROST 0.005 % OP SOLN
1.0000 [drp] | Freq: Every day | OPHTHALMIC | Status: DC
  Administered 2022-08-03 – 2022-08-08 (×6): 1 [drp] via OPHTHALMIC
  Filled 2022-08-03: qty 2.5

## 2022-08-03 MED ORDER — MORPHINE SULFATE (PF) 2 MG/ML IV SOLN
2.0000 mg | INTRAVENOUS | Status: DC | PRN
  Administered 2022-08-05: 2 mg via INTRAVENOUS
  Filled 2022-08-03: qty 1

## 2022-08-03 MED ORDER — ACETAMINOPHEN 325 MG PO TABS
650.0000 mg | ORAL_TABLET | Freq: Four times a day (QID) | ORAL | Status: DC | PRN
  Administered 2022-08-08: 650 mg via ORAL
  Filled 2022-08-03 (×3): qty 2

## 2022-08-03 MED ORDER — HEPARIN SODIUM (PORCINE) 5000 UNIT/ML IJ SOLN
5000.0000 [IU] | Freq: Three times a day (TID) | INTRAMUSCULAR | Status: DC
  Administered 2022-08-03 – 2022-08-04 (×4): 5000 [IU] via SUBCUTANEOUS
  Filled 2022-08-03 (×4): qty 1

## 2022-08-03 MED ORDER — LEVOTHYROXINE SODIUM 112 MCG PO TABS
112.0000 ug | ORAL_TABLET | Freq: Every day | ORAL | Status: DC
  Administered 2022-08-04 – 2022-08-09 (×6): 112 ug via ORAL
  Filled 2022-08-03 (×6): qty 1

## 2022-08-03 MED ORDER — ACETAMINOPHEN 650 MG RE SUPP: 650.0000 mg | Freq: Four times a day (QID) | RECTAL | Status: DC | PRN

## 2022-08-03 NOTE — Progress Notes (Signed)
Resent urine culture to lab '@2026'$ 

## 2022-08-03 NOTE — Plan of Care (Signed)
Direct admission requested by Dr. Zachery Dakins [name] is a 82 year old male with pmh COPD, CAD, CKD stage IIIa, RBBB, HTN, HLD, hypothyroidism, RLS, and BPH who is seen for follow-up 2 days ago his left intramedullary nailing after suffering an acute impacted left intertrochanteric fracture in 04/2022.  Patient was noted to have nonunion for which plans had been for a conversion operation.  However, wife noted patient has been more delirious and bedridden since that time for which admission was requested.  Will need workup for delirium.  Preop labs had not been obtained yet.  If medically stable possibly can do conversion while here.

## 2022-08-03 NOTE — H&P (View-Only) (Signed)
ORTHOPAEDIC CONSULTATION  REQUESTING PHYSICIAN: Chiu, Stephen K, MD  Chief Complaint: Left hip pain with acutely worsening pain and delirium  HPI: Timothy Spencer is a 82 y.o. male who underwent ORIF with intramedullary nail for left obstructive femur fracture back in December 2023.  He was discharged to [facility] after surgery.  He had difficult time with mobility but was initially able to get around somewhat with a walker.  Over the past few months symptoms have been getting progressively worse with increasing difficulty with mobility.  He presented to clinic on Tuesday with at this point very debilitating pain in the left hip joint.  X-rays demonstrated nonunion with varus collapse of his intertrochanteric femur fracture.  Recommended conversion surgery.  Patient's wife came in today saying that even since Tuesday he is getting acutely worse from a cognitive standpoint as well as pain control standpoint.  Recommended admission to the hospital for medical workup given significant medical comorbidities.  Past Medical History:  Diagnosis Date   Anxiety and depression    BPH (benign prostatic hyperplasia)    CAD (coronary artery disease)    COPD (chronic obstructive pulmonary disease) (HCC)    DOE (dyspnea on exertion)    ED (erectile dysfunction)    Emphysema of lung (HCC)    First degree AV block    GERD (gastroesophageal reflux disease)    Glaucoma    Gout    Gynecomastia    H/O ETOH abuse    Hemochromatosis    Hyperlipidemia    Hypertension    Hypothyroidism    Macular degeneration    OSA (obstructive sleep apnea)    Osteopenia    Peripheral neuropathy    RLS (restless legs syndrome)    Thrombocytopenia (HCC)    Past Surgical History:  Procedure Laterality Date   CARDIAC CATHETERIZATION  03/27/2008   Medical therapy   CARDIAC CATHETERIZATION  06/03/2010   Medical theray and smoking cessation   CARDIAC CATHETERIZATION N/A 11/24/2014   Procedure: Left Heart Cath and Coronary  Angiography;  Surgeon: Thomas A Kelly, MD;  Location: [facility];  Service: Cardiovascular;  Laterality: N/A;   CARDIOPULMONARY EXERCISE TEST  02/12/2012   Peak VO2 63% of predicted   FEMUR IM NAIL Left 05/10/2022   Procedure: INTRAMEDULLARY (IM) NAIL FEMORAL;  Surgeon: Brigitte Soderberg A, MD;  Location: [facility];  Service: Orthopedics;  Laterality: Left;   ORIF FEMUR FRACTURE Right    TRANSTHORACIC ECHOCARDIOGRAM  11/28/2011   EF >55%, normal   Social History   Socioeconomic History   Marital status: Married    Spouse name: Not on file   Number of children: Not on file   Years of education: Not on file   Highest education level: Not on file  Occupational History   Not on file  Tobacco Use   Smoking status: Former    Packs/day: 2.00    Years: 50.00    Total pack years: 100.00    Types: Cigarettes    Start date: 04/11/1960    Quit date: 03/19/2011    Years since quitting: 11.3   Smokeless tobacco: Never   Tobacco comments:    Quit for 1 year total   Substance and Sexual Activity   Alcohol use: No    Alcohol/week: 0.0 standard drinks of alcohol   Drug use: No   Sexual activity: Not on file  Other Topics Concern   Not on file  Social History Narrative   ** Merged History Encounter **         [facility] (03/06/17): Originally from [place], [place]. Has always lived in [place]. Has traveled to [place], [place], [place], [place], [place], & [place]. Has a dog currently. No bird exposure. No mold or hot tub exposure. Previously enjoyed riding horse   s and playing golf. Currently has no hobbies. 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    Social Determinants of Health   Financial Resource Strain: Not on file  Food Insecurity: No Food Insecurity (08/03/2022)   Hunger Vital Sign    Worried About Running Out of Food in the Last Year: Never true    Ran Out of Food in the Last Year: Never true  Transportation Needs: No  Transportation Needs (08/03/2022)   PRAPARE - Transportation    Lack of Transportation (Medical): No    Lack of Transportation (Non-Medical): No  Physical Activity: Not on file  Stress: Not on file  Social Connections: Not on file   Family History  Adopted: Yes   Allergies  Allergen Reactions   Hydrochlorothiazide W-Spironolactone     Other Reaction(s): dizziness, fainting     Positive ROS: All other systems have been reviewed and were otherwise negative with the exception of those mentioned in the HPI and as above.  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  Left hip incisions appear well-healed with no signs of infection.  No significant swelling or tenderness of the left lower extremity reassuring against DVT.  Concern for possible urinary tract infection.  Ultimately the patient is having worsening pain from the left hip given nonunion and fixation failure.  Would benefit from conversion to left total hip arthroplasty.  If medically clear and optimized can proceed with surgery during this admission.   Will follow-up lab work and medical workup.  Possible surgery in the next 2 to 3 days   Emelia Sandoval A Breauna Mazzeo, MD  Contact information:   Weekdays 7am-5pm epic message Dr. Errika Narvaiz, or call office for patient follow up: [phone] After hours and holidays please check [email or web address] for group call information for [facility] 

## 2022-08-03 NOTE — H&P (Signed)
History and Physical    Patient: Timothy Spencer [id number] DOB: [birth year] DOA: 08/03/2022 DOS: the patient was seen and examined on 08/03/2022 PCP: Deland Pretty, MD  Patient coming from: Home  Chief Complaint:  Chief Complaint  Patient presents with   Altered Mental Status   Hip Injury   HPI: Timothy Spencer is a 82 y.o. male with medical history significant of copd, CAD, CKD3a, RBBB , HTN, hypothyroid, BPH with chronic indwelling cath presents as a direct admit from home with delirium that started on the AM of presentation. Pt had surgery for impated L intertrochanteric fracture on 12/23. Noted to have gradually worsening pain at site. 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 On the AM of presentation, pt was noted to be more confused and lethargic. Pt's wife left pt home alone w/ neighbor to tell orthopod in the office. Orthopedic Surgery called hospitalist for consideration for direct admit. Hospitalist agreed to direct admit from home. Of note, pt has been with indwelling cath since 12/23, followed by Urology for failed voiding trials. Family reports very cloudy urine leading up to visit  Review of Systems: As mentioned in the history of present illness. All other systems reviewed and are negative. Past Medical History:  Diagnosis Date   Anxiety and depression    BPH (benign prostatic hyperplasia)    CAD (coronary artery disease)    COPD (chronic obstructive pulmonary disease) (HCC)    DOE (dyspnea on exertion)    ED (erectile dysfunction)    Emphysema of lung (HCC)    First degree AV block    GERD (gastroesophageal reflux disease)    Glaucoma    Gout    Gynecomastia    H/O ETOH abuse    Hemochromatosis    Hyperlipidemia    Hypertension    Hypothyroidism    Macular degeneration    OSA (obstructive sleep apnea)    Osteopenia    Peripheral neuropathy    RLS (restless legs syndrome)    Thrombocytopenia ([place] [facility])    Past  Surgical History:  Procedure Laterality Date   CARDIAC CATHETERIZATION  03/27/2008   Medical therapy   CARDIAC CATHETERIZATION  06/03/2010   Medical theray and smoking cessation   CARDIAC CATHETERIZATION N/A 11/24/2014   Procedure: Left Heart Cath and Coronary Angiography;  Surgeon: Troy Sine, MD;  Location: [facility];  Service: Cardiovascular;  Laterality: N/A;   CARDIOPULMONARY EXERCISE TEST  02/12/2012   Peak VO2 63% of predicted   FEMUR IM NAIL Left 05/10/2022   Procedure: INTRAMEDULLARY (IM) NAIL FEMORAL;  Surgeon: Willaim Sheng, MD;  Location: [facility];  Service: Orthopedics;  Laterality: Left;   ORIF FEMUR FRACTURE Right    TRANSTHORACIC ECHOCARDIOGRAM  11/28/2011   EF >55%, normal   Social History:  reports that he quit smoking about 11 years ago. His smoking use included cigarettes. He started smoking about 62 years ago. He has a 100.00 pack-year smoking history. He has never used smokeless tobacco. He reports that he does not drink alcohol and does not use drugs.  Allergies  Allergen Reactions   Hydrochlorothiazide W-Spironolactone     Other Reaction(s): dizziness, fainting    Family History  Adopted: Yes    Prior to Admission medications   Medication Sig Start Date End Date Taking? Authorizing Provider  acetaminophen (TYLENOL) 500 MG tablet Take 1,000 mg by mouth every 6 (six) hours as needed for moderate pain.    [provider]  albuterol (VENTOLIN HFA) 108 (90 Base) MCG/ACT inhaler Inhale 2 puffs into the lungs every 6 (six) hours as needed for wheezing or shortness of breath. 02/08/22   Mannam, Hart Robinsons, MD  amLODipine (NORVASC) 10 MG tablet Take 10 mg by mouth daily. 10/10/21   [provider]  Budeson-Glycopyrrol-Formoterol (BREZTRI AEROSPHERE) 160-9-4.8 MCG/ACT AERO Inhale 2 puffs into the lungs in the morning and at bedtime. 11/22/21   Mannam, Hart Robinsons, MD  buPROPion (WELLBUTRIN XL) 150 MG 24 hr tablet Take 150 mg by mouth every morning.  07/17/21   [provider]  cholecalciferol (CHOLECALCIFEROL) 25 MCG tablet Take 1 tablet (1,000 Units total) by mouth daily. 05/14/22   Dahal, Marlowe Aschoff, MD  enoxaparin (LOVENOX) 40 MG/0.4ML injection Inject 0.4 mLs (40 mg total) into the skin daily for 28 days. 05/13/22 06/10/22  Ventura Bruns, PA-C  feeding supplement (ENSURE ENLIVE / ENSURE PLUS) LIQD Take 237 mLs by mouth 2 (two) times daily between meals. 05/13/22   Dahal, Marlowe Aschoff, MD  ipratropium-albuterol (DUONEB) 0.5-2.5 (3) MG/3ML SOLN Take 3 mLs by nebulization every 4 (four) hours as needed. Patient taking differently: Take 3 mLs by nebulization every 4 (four) hours as needed (wheezing, SOB). 01/18/22   Mannam, Hart Robinsons, MD  latanoprost (XALATAN) 0.005 % ophthalmic solution Place 1 drop into both eyes at bedtime. 08/17/21   [provider]  levothyroxine (SYNTHROID) 112 MCG tablet Take 112 mcg by mouth daily. 07/29/19   [provider]  rOPINIRole (REQUIP) 1 MG tablet Take 1 mg by mouth 3 (three) times daily. 01/01/13   [provider]  rosuvastatin (CRESTOR) 5 MG tablet Take 5 mg by mouth daily. 07/15/19   [provider]  senna-docusate (SENOKOT-S) 8.6-50 MG tablet Take 1 tablet by mouth at bedtime as needed for mild constipation. 05/13/22   Terrilee Croak, MD  sertraline (ZOLOFT) 100 MG tablet Take 100 mg by mouth daily. 01/25/13   [provider]  tamsulosin (FLOMAX) 0.4 MG CAPS capsule Take 0.4 mg by mouth daily.  01/31/13   [provider]  Vitamin D, Ergocalciferol, (DRISDOL) 1.25 MG (50000 UNIT) CAPS capsule Take 1 capsule (50,000 Units total) by mouth every 7 (seven) days. 05/13/22   Terrilee Croak, MD    Physical Exam: Vitals:   08/03/22 1548  BP: (!) 100/50  Pulse: 97  Resp: 15  Temp: 97.7 F (36.5 C)  TempSrc: Oral  SpO2: 98%   General exam: Awake, laying in bed, in nad Respiratory system: Normal respiratory effort, no wheezing Cardiovascular system: regular rate, s1,  s2 Gastrointestinal system: Soft, nondistended, positive BS Central nervous system: CN2-12 grossly intact, strength intact Extremities: Perfused, no clubbing Skin: Normal skin turgor, no notable skin lesions seen Psychiatry: Mood normal // no visual hallucinations   Data Reviewed:  Labs from 12/15: Na 135, K 4.4, Cr 1.45  Assessment and Plan: Delirium -Mentation seems much improved on arrival from home -No labs, will order -Currently afebrile -Check CXR and UA/urine culture  Hx L femoral fracture -Followed by Orthopedic Surgery and is s/p surgery in 12/23 -Pt is planned for surgical correction of nonunion -Orthopedic Surgery consulted and is now following  CAD -Stable at present -Without chest pain -plan to resume home meds once confirmed by pharmacy  CKD 3a -Last reported Cr of 1.46 from 12/23 -Ordered labs on arrival as direct admit, will follow  COPD -no wheezing on exam -on minimal O2 support  HTN -BP stable at this time, albeit soft with sbp of 100 -  awaiting confirmation of home meds, however would hold off on bp meds for the moment given soft bp  HLD -continue statin per home regimen once confirmed by pharmacy  BPH -Followed by Urology  -Family reports pt failed multiple voiding trials -Cont foley for now, will need to change if evidence of UTI   Advance Care Planning:   Code Status: Full Code Full Code confirmed with pt and family  Consults: Orthopedic Surgery  Family Communication: Pt in room, pt's wife at bedside  Severity of Illness: The appropriate patient status for this patient is OBSERVATION. Observation status is judged to be reasonable and necessary in order to provide the required intensity of service to ensure the patient's safety. The patient's presenting symptoms, physical exam findings, and initial radiographic and laboratory data in the context of their medical condition is felt to place them at decreased risk for further clinical  deterioration. Furthermore, it is anticipated that the patient will be medically stable for discharge from the hospital within 2 midnights of admission.   Author: Marylu Lund, MD 08/03/2022 4:55 PM  For on call review [email or web address].

## 2022-08-03 NOTE — Consult Note (Signed)
ORTHOPAEDIC CONSULTATION  REQUESTING PHYSICIAN: Donne Hazel, MD  Chief Complaint: Left hip pain with acutely worsening pain and delirium  HPI: Timothy Spencer is a 82 y.o. male who underwent ORIF with intramedullary nail for left obstructive femur fracture back in December 2023.  He was discharged to [facility] burn after surgery.  He had difficult time with mobility but was initially able to get around somewhat with a walker.  Over the past few months symptoms have been getting progressively worse with increasing difficulty with mobility.  He presented to clinic on Tuesday with at this point very debilitating pain in the left hip joint.  X-rays demonstrated nonunion with varus collapse of his intertrochanteric femur fracture.  Recommended conversion surgery.  Patient's wife came in today saying that even since Tuesday he is getting acutely worse from a cognitive standpoint as well as pain control standpoint.  Recommended admission to the hospital for medical workup given significant medical comorbidities.  Past Medical History:  Diagnosis Date   Anxiety and depression    BPH (benign prostatic hyperplasia)    CAD (coronary artery disease)    COPD (chronic obstructive pulmonary disease) (HCC)    DOE (dyspnea on exertion)    ED (erectile dysfunction)    Emphysema of lung (HCC)    First degree AV block    GERD (gastroesophageal reflux disease)    Glaucoma    Gout    Gynecomastia    H/O ETOH abuse    Hemochromatosis    Hyperlipidemia    Hypertension    Hypothyroidism    Macular degeneration    OSA (obstructive sleep apnea)    Osteopenia    Peripheral neuropathy    RLS (restless legs syndrome)    Thrombocytopenia ([place])    Past Surgical History:  Procedure Laterality Date   CARDIAC CATHETERIZATION  03/27/2008   Medical therapy   CARDIAC CATHETERIZATION  06/03/2010   Medical theray and smoking cessation   CARDIAC CATHETERIZATION N/A 11/24/2014   Procedure: Left Heart Cath and Coronary  Angiography;  Surgeon: Troy Sine, MD;  Location: [facility];  Service: Cardiovascular;  Laterality: N/A;   CARDIOPULMONARY EXERCISE TEST  02/12/2012   Peak VO2 63% of predicted   FEMUR IM NAIL Left 05/10/2022   Procedure: INTRAMEDULLARY (IM) NAIL FEMORAL;  Surgeon: Willaim Sheng, MD;  Location: [facility];  Service: Orthopedics;  Laterality: Left;   ORIF FEMUR FRACTURE Right    TRANSTHORACIC ECHOCARDIOGRAM  11/28/2011   EF >55%, normal   Social History   Socioeconomic History   Marital status: Married    Spouse name: Not on file   Number of children: Not on file   Years of education: Not on file   Highest education level: Not on file  Occupational History   Not on file  Tobacco Use   Smoking status: Former    Packs/day: 2.00    Years: 50.00    Total pack years: 100.00    Types: Cigarettes    Start date: 04/11/1960    Quit date: 03/19/2011    Years since quitting: 11.3   Smokeless tobacco: Never   Tobacco comments:    Quit for 1 year total   Substance and Sexual Activity   Alcohol use: No    Alcohol/week: 0.0 standard drinks of alcohol   Drug use: No   Sexual activity: Not on file  Other Topics Concern   Not on file  Social History Narrative   ** Merged History Encounter **  [facility] (03/06/17): Originally from [place], [place]. Has always lived in [place]. Has traveled to [place], [place], [place], [place], [place], & [place]. Has a dog currently. No bird exposure. No mold or hot tub exposure. Previously enjoyed riding horse   s and playing golf. Currently has no hobbies. 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    Social Determinants of Health   Financial Resource Strain: Not on file  Food Insecurity: No Food Insecurity (08/03/2022)   Hunger Vital Sign    Worried About Running Out of Food in the Last Year: Never true    Ran Out of Food in the Last Year: Never true  Transportation Needs: No  Transportation Needs (08/03/2022)   PRAPARE - Hydrologist (Medical): No    Lack of Transportation (Non-Medical): No  Physical Activity: Not on file  Stress: Not on file  Social Connections: Not on file   Family History  Adopted: Yes   Allergies  Allergen Reactions   Hydrochlorothiazide W-Spironolactone     Other Reaction(s): dizziness, fainting     Positive ROS: All other systems have been reviewed and were otherwise negative with the exception of those mentioned in the HPI and as above.  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  Left hip incisions appear well-healed with no signs of infection.  No significant swelling or tenderness of the left lower extremity reassuring against DVT.  Concern for possible urinary tract infection.  Ultimately the patient is having worsening pain from the left hip given nonunion and fixation failure.  Would benefit from conversion to left total hip arthroplasty.  If medically clear and optimized can proceed with surgery during this admission.   Will follow-up lab work and medical workup.  Possible surgery in the next 2 to 3 days   Willaim Sheng, MD  Contact information:   Weekdays 7am-5pm epic message Dr. Zachery Dakins, or call office for patient follow up: [phone] After hours and holidays please check [email or web address] for group call information for [facility]

## 2022-08-04 DIAGNOSIS — G928 Other toxic encephalopathy: Secondary | ICD-10-CM | POA: Diagnosis not present

## 2022-08-04 LAB — COMPREHENSIVE METABOLIC PANEL
ALT: 9 U/L (ref 0–44)
AST: 18 U/L (ref 15–41)
Albumin: 3 g/dL — ABNORMAL LOW (ref 3.5–5.0)
Alkaline Phosphatase: 113 U/L (ref 38–126)
Anion gap: 4 — ABNORMAL LOW (ref 5–15)
BUN: 22 mg/dL (ref 8–23)
CO2: 27 mmol/L (ref 22–32)
Calcium: 8.7 mg/dL — ABNORMAL LOW (ref 8.9–10.3)
Chloride: 104 mmol/L (ref 98–111)
Creatinine, Ser: 1.47 mg/dL — ABNORMAL HIGH (ref 0.61–1.24)
GFR, Estimated: 47 mL/min — ABNORMAL LOW (ref >60)
Glucose, Bld: 104 mg/dL — ABNORMAL HIGH (ref 70–99)
Potassium: 4 mmol/L (ref 3.5–5.1)
Sodium: 135 mmol/L (ref 135–145)
Total Bilirubin: 1.2 mg/dL (ref 0.3–1.2)
Total Protein: 6.1 g/dL — ABNORMAL LOW (ref 6.5–8.1)

## 2022-08-04 LAB — SURGICAL PCR SCREEN
MRSA, PCR: NEGATIVE
Staphylococcus aureus: NEGATIVE

## 2022-08-04 LAB — CBC
HCT: 36.3 % — ABNORMAL LOW (ref 39.0–52.0)
Hemoglobin: 12.3 g/dL — ABNORMAL LOW (ref 13.0–17.0)
MCH: 29.9 pg (ref 26.0–34.0)
MCHC: 33.9 g/dL (ref 30.0–36.0)
MCV: 88.3 fL (ref 80.0–100.0)
Platelets: 131 10*3/uL — ABNORMAL LOW (ref 150–400)
RBC: 4.11 MIL/uL — ABNORMAL LOW (ref 4.22–5.81)
RDW: 14.6 % (ref 11.5–15.5)
WBC: 5.4 10*3/uL (ref 4.0–10.5)
nRBC: 0 % (ref 0.0–0.2)

## 2022-08-04 MED ORDER — BUPROPION HCL ER (XL) 150 MG PO TB24
150.0000 mg | ORAL_TABLET | Freq: Every morning | ORAL | Status: DC
  Administered 2022-08-04 – 2022-08-09 (×5): 150 mg via ORAL
  Filled 2022-08-04 (×5): qty 1

## 2022-08-04 MED ORDER — SERTRALINE HCL 100 MG PO TABS
100.0000 mg | ORAL_TABLET | Freq: Every day | ORAL | Status: DC
  Administered 2022-08-04 – 2022-08-09 (×5): 100 mg via ORAL
  Filled 2022-08-04 (×5): qty 1

## 2022-08-04 MED ORDER — SODIUM CHLORIDE 0.9 % IV SOLN
1.0000 g | INTRAVENOUS | Status: AC
  Administered 2022-08-04 – 2022-08-08 (×4): 1 g via INTRAVENOUS
  Filled 2022-08-04 (×4): qty 10

## 2022-08-04 NOTE — Hospital Course (Signed)
82 y.o. male with medical history significant of copd, CAD, CKD3a, RBBB , HTN, hypothyroid, BPH with chronic indwelling cath presents as a direct admit from home with delirium that started on the AM of presentation. Pt had surgery for impated L intertrochanteric fracture on 12/23. Noted to have gradually worsening pain at site. 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 On the AM of presentation, pt was noted to be more confused and lethargic. Pt's wife left pt home alone w/ neighbor to tell orthopod in the office. Orthopedic Surgery called hospitalist for consideration for direct admit. Hospitalist agreed to direct admit from home. Of note, pt has been with indwelling cath since 12/23, followed by Urology for failed voiding trials. Family reports very cloudy urine leading up to visit

## 2022-08-04 NOTE — TOC Initial Note (Addendum)
Transition of Care [facility]) - Initial/Assessment Note    Patient Details  Name: Timothy Spencer MRN: [id number] Date of Birth: [birth year]  Transition of Care [facility]) CM/SW Contact:    Verdell Carmine, RN Phone Number: 08/04/2022, 8:26 AM  Clinical Narrative:                 82 year old patient presents with confusion. Recent history of indwelling foley ( since 12/23) due to failed voiding trials. History of THR in December that has separated, needs surgical intervention. Lives at home with wife, but did go to [place] after last surgery. Direct admitted from home with increased confusion encephalopathy.  Plan to clear up UTI and possible have repeat surgery in a few days per orthopedic note  Patient will likely need SNF post surgically.  1600 Had long conversation with wife. She is thinking he will likely have to go to SNF post surgically as he did last time. He went to [facility] previously and she does NOT want him to return there, she cites many problems with staffing and care. She had [facility] after SNF and she was also not impressed with them, she would like a different company. Explained that we must wait for PT and their recommendations, but we will get her a list of choices from [email or web address].  Messaged CSW about placing choices in patients room as well as MOON letter due to this RNCM is remote.   TOC will follow for needs, recommendations, and transitions of care  Expected Discharge Plan: Skilled Nursing Facility Barriers to Discharge: Continued Medical Work up   Patient Goals and CMS Choice            Expected Discharge Plan and Services In-house Referral: Clinical Social Work Discharge Planning Services: CM Consult   Living arrangements for the past 2 months: Apartment                                      Prior Living Arrangements/Services Living arrangements for the past 2 months: Apartment Lives with:: Spouse Patient language and need for  interpreter reviewed:: Yes        Need for Family Participation in Patient Care: Yes (Comment) Care giver support system in place?: Yes (comment) Current home services: DME (walker) Criminal Activity/Legal Involvement Pertinent to Current Situation/Hospitalization: No - Comment as needed  Activities of Daily Living Home Assistive Devices/Equipment: None ADL Screening (condition at time of admission) Patient's cognitive ability adequate to safely complete daily activities?: Yes Is the patient deaf or have difficulty hearing?: No Does the patient have difficulty seeing, even when wearing glasses/contacts?: No Does the patient have difficulty concentrating, remembering, or making decisions?: No Patient able to express need for assistance with ADLs?: Yes Does the patient have difficulty dressing or bathing?: No Independently performs ADLs?: No Communication: Needs assistance Is this a change from baseline?: Pre-admission baseline Dressing (OT): Needs assistance Is this a change from baseline?: Pre-admission baseline Grooming: Appropriate for developmental age Feeding: Appropriate for developmental age Bathing: Appropriate for developmental age 72: Appropriate for developmental age In/Out Bed: Appropriate for developmental age 26 in Home: Appropriate for developmental age Does the patient have difficulty walking or climbing stairs?: No Weakness of Legs: Both  Permission Sought/Granted                  Emotional Assessment       Orientation: : Fluctuating Orientation (  Suspected and/or reported Sundowners) Alcohol / Substance Use: Not Applicable Psych Involvement: No (comment)  Admission diagnosis:  Toxic metabolic encephalopathy Q000111Q Patient Active Problem List   Diagnosis Date Noted   Toxic metabolic encephalopathy Q000111Q   Protein-calorie malnutrition, severe 05/10/2022   Closed intertrochanteric fracture of left femur, initial encounter ([place])  05/09/2022   Thrombocytopenia ([place]) 05/09/2022   Chronic kidney disease, stage 3a ([place]) 05/09/2022   Anxiety and depression    Alpha-1-antitrypsin 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 04/11/2017   Lung nodule < 6cm on CT 03/06/2017   COPD (chronic obstructive pulmonary disease) ([place]) 08/17/2015   Personal history of tobacco use, presenting hazards to health 06/28/2015   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   Essential hypertension 06/18/2015   CAD in native artery    Exertional dyspnea 11/21/2014   Right bundle branch block 07/17/2014   First degree AV block 07/17/2014   CAD (coronary artery disease) 07/17/2014   Fatigue 03/18/2013   Hypothyroid 03/18/2013   Hyperlipidemia with target LDL less than 70 03/18/2013   PCP:  Deland Pretty, MD Pharmacy:   [facility] [place], [place] [address] Phone: [phone] Fax: [phone]  [facility] - [place], [place] - [address] [place] [address] Phone: [phone] Fax: [phone]  [facility] - [place], [place] - [address] [place] [address] Phone: [phone] Fax: [phone]     Social Determinants of Health ([place]) Social History: [place]: No Food Insecurity (08/03/2022)  Housing: Low Risk  (08/03/2022)  Transportation Needs: No Transportation Needs (08/03/2022)  Utilities: Not At Risk (08/03/2022)  Depression (PHQ2-9): Low Risk  (05/06/2019)  Tobacco Use: Medium Risk (05/12/2022)   SDOH Interventions: Food Insecurity Interventions: Patient Refused Housing Interventions: Patient Refused Utilities Interventions: Intervention Not Indicated   Readmission Risk Interventions     No data to display

## 2022-08-04 NOTE — Anesthesia Preprocedure Evaluation (Addendum)
Anesthesia Evaluation  Patient identified by MRN, date of birth, ID band Patient awake    Reviewed: Allergy & Precautions, NPO status , Patient's Chart, lab work & pertinent test results  Airway Mallampati: II  TM Distance: >3 FB Neck ROM: Full    Dental no notable dental hx.    Pulmonary sleep apnea , COPD,  COPD inhaler, former smoker   Pulmonary exam normal        Cardiovascular hypertension, Pt. on medications + CAD  + dysrhythmias  Rhythm:Regular Rate:Normal  ECHO 06/23:  1. Left ventricular ejection fraction, by estimation, is 60 to 65%. The  left ventricle has normal function. The left ventricle has no regional  wall motion abnormalities. Left ventricular diastolic parameters are  consistent with Grade I diastolic  dysfunction (impaired relaxation).   2. Right ventricular systolic function is normal. The right ventricular  size is normal.   3. The mitral valve is normal in structure. No evidence of mitral valve  regurgitation. No evidence of mitral stenosis.   4. The aortic valve is normal in structure. Aortic valve regurgitation is  not visualized. No aortic stenosis is present.   5. The inferior vena cava is normal in size with greater than 50%  respiratory variability, suggesting right atrial pressure of 3 mmHg.      Neuro/Psych   Anxiety Depression       GI/Hepatic Neg liver ROS,GERD  ,,  Endo/Other  Hypothyroidism    Renal/GU   negative genitourinary   Musculoskeletal Left hip fx   Abdominal Normal abdominal exam  (+)   Peds  Hematology negative hematology ROS (+)   Anesthesia Other Findings   Reproductive/Obstetrics                             Anesthesia Physical Anesthesia Plan  ASA: 3  Anesthesia Plan: Spinal and MAC   Post-op Pain Management:    Induction: Intravenous  PONV Risk Score and Plan: 1 and Ondansetron, Dexamethasone, Treatment may vary due to age  or medical condition and Propofol infusion  Airway Management Planned: Natural Airway, Nasal Cannula and Simple Face Mask  Additional Equipment: None  Intra-op Plan:   Post-operative Plan:   Informed Consent: I have reviewed the patients History and Physical, chart, labs and discussed the procedure including the risks, benefits and alternatives for the proposed anesthesia with the patient or authorized representative who has indicated his/her understanding and acceptance.     Dental advisory given  Plan Discussed with: CRNA  Anesthesia Plan Comments: (Lab Results      Component                Value               Date                      WBC                      5.4                 08/04/2022                HGB                      12.3 (L)            08/04/2022  HCT                      36.3 (L)            08/04/2022                MCV                      88.3                08/04/2022                PLT                      131 (L)             08/04/2022             Lab Results      Component                Value               Date                      NA                       135                 08/04/2022                K                        4.0                 08/04/2022                CO2                      27                  08/04/2022                GLUCOSE                  104 (H)             08/04/2022                BUN                      22                  08/04/2022                CREATININE               1.47 (H)            08/04/2022                CALCIUM                  8.7 (L)             08/04/2022                GFRNONAA                 47 (L)  08/04/2022           )       Anesthesia Quick Evaluation

## 2022-08-04 NOTE — Progress Notes (Signed)
Progress Note   Patient: Timothy Spencer [id number] DOB: 1940/12/28 DOA: 08/03/2022     1 DOS: the patient was seen and examined on 08/04/2022   Brief hospital course: 82 y.o. male with medical history significant of copd, CAD, CKD3a, RBBB , HTN, hypothyroid, BPH with chronic indwelling cath presents as a direct admit from home with delirium that started on the AM of presentation. Pt had surgery for impated L intertrochanteric fracture on 12/23. Noted to have gradually worsening pain at site. 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 On the AM of presentation, pt was noted to be more confused and lethargic. Pt's wife left pt home alone w/ neighbor to tell orthopod in the office. Orthopedic Surgery called hospitalist for consideration for direct admit. Hospitalist agreed to direct admit from home. Of note, pt has been with indwelling cath since 12/23, followed by Urology for failed voiding trials. Family reports very cloudy urine leading up to visit   Assessment and Plan:  Delirium -Mentation seems much improved on arrival from home -UA is strongly suggestive of UTI, see below -Continue abx and follow cx  UTI related to chronic foley cath -Pt has hx of BPH and chronic bladder outlet obstruction, requiring foley and followed by Urology as outpatient -UA is highly suggestive of UTI with leuks and nitrates -Urine cx is pending -Continue on empiric rocephin for now -Have changed out foley cath 3/8   Hx L femoral fracture -Followed by Orthopedic Surgery and is s/p surgery in 12/23 -Pt is planned for surgical correction of nonunion -Discussed with orthopedic Surgery. Vitals are stable, CXR clear. Ordered and reviewed EKG which has RBBB that is unchanged from baseline. At this time, benefits to surgery would outweigh perioperative risk   CAD -Stable at present -Remains without chest pain -BP meds were held secondary to soft bp at presentation   CKD  3a -Last reported Cr of 1.46 from 12/23 -Cr stable at 1.47   COPD -no wheezing on exam -on minimal O2 support   HTN -BP remains stable at this time, albeit soft with sbp of 100 -remains stable off bp meds for now. BP is very well controlled   HLD -continue statin per home regimen    BPH -Followed by Urology  -Family reports pt failed multiple voiding trials -changed foley 3/8 given UTI    Subjective: Reports feeling better today  Physical Exam: Vitals:   08/03/22 2013 08/04/22 0343 08/04/22 0822 08/04/22 1422  BP: (!) 114/51 131/63 117/61 (!) 114/52  Pulse: 63 92 85 72  Resp: '18  18 16  '$ Temp: 98 F (36.7 C) 98.6 F (37 C) 98 F (36.7 C) 98.8 F (37.1 C)  TempSrc: Oral Oral Oral Oral  SpO2: 97% 94% 98% 98%   General exam: Awake, laying in bed, in nad Respiratory system: Normal respiratory effort, no wheezing Cardiovascular system: regular rate, s1, s2 Gastrointestinal system: Soft, nondistended, positive BS Central nervous system: CN2-12 grossly intact, strength intact Extremities: Perfused, no clubbing Skin: Normal skin turgor, no notable skin lesions seen Psychiatry: Mood normal // no visual hallucinations   Data Reviewed:  Labs reviewed: Na 135, K 4.0, Cr 1.47, WBC 5.4  Family Communication: Pt in room, family not at bedside  Disposition: Status is: Observation The patient remains OBS appropriate and will d/c before 2 midnights.  Planned Discharge Destination:  Unclear at this time    Author: Marylu Lund, MD 08/04/2022 2:34 PM  For on call review  [email or web address]

## 2022-08-04 NOTE — Progress Notes (Signed)
     Subjective:  Pt was direct admit yesterday to observation due delirium and pain.  Currently being treated for UTI.  Pain described as severe with ROM and absent at rest.  Reports improved mentation since admission.  No new injury/trauma.  Accompanied by spouse.    Objective:   VITALS:   Vitals:   08/03/22 2013 08/04/22 0343 08/04/22 0822 08/04/22 1422  BP: (!) 114/51 131/63 117/61 (!) 114/52  Pulse: 63 92 85 72  Resp: 18  18 16   Temp: 98 F (36.7 C) 98.6 F (37 C) 98 F (36.7 C) 98.8 F (37.1 C)  TempSrc: Oral Oral Oral Oral  SpO2: 97% 94% 98% 98%   LLE Neurovascularly intact Dorsiflexion/Plantar flexion intact No cellulitis present Compartment soft Left hip non-TTP, without leg shortening or malrotation Severe pain with hip ROM or log roll  Lab Results  Component Value Date   WBC 5.4 08/04/2022   HGB 12.3 (L) 08/04/2022   HCT 36.3 (L) 08/04/2022   MCV 88.3 08/04/2022   PLT 131 (L) 08/04/2022   BMET    Component Value Date/Time   NA 135 08/04/2022 0417   K 4.0 08/04/2022 0417   CL 104 08/04/2022 0417   CO2 27 08/04/2022 0417   GLUCOSE 104 (H) 08/04/2022 0417   BUN 22 08/04/2022 0417   CREATININE 1.47 (H) 08/04/2022 0417   CREATININE 1.44 (H) 06/19/2016 1547   CALCIUM 8.7 (L) 08/04/2022 0417   GFRNONAA 47 (L) 08/04/2022 0417     Imaging: X-rays obtained in clinic 08/01/2022 demonstrate varus nonunion of left intertrochanteric femur fracture with perforation of the soft medullary screw through the femoral head.   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 Left hip incisions appear well-healed and without signs of infection. No significant swelling or tenderness of the left lower extremity, reassuring against DVT.    Labs reviewed and hospitalist note  reviewed.  Creatinine 1.44-1.47.  Mild anemia Hgb 12.3 and Hct 36.3.  No elevated WBC. Being treated for UTI empirically with Rocephin.   Ultimately the patient is having worsening pain from the left hip given nonunion and fixation failure. Would benefit from conversion to left total hip arthroplasty. If medically cleared and optimized can proceed with surgery during this admission. Plan for likely surgery tomorrow (Saturday) or Sunday.   Prince Rome 5/0/0938, 4:51 PM   Charlies Constable, MD  Contact information:   [phone] 7am-5pm epic message Dr. Zachery Dakins, or call office for patient follow up: [phone] After hours and holidays please check [email or web address] for group call information for [facility]

## 2022-08-04 NOTE — Care Management Obs Status (Signed)
[facility] NOTIFICATION   Patient Details  Name: JAMIYL FLIGHT MRN: [id number] Date of Birth: Oct 21, 1940   Medicare Observation Status Notification Given:  Yes    Verdell Carmine, RN 08/04/2022, 4:11 PM

## 2022-08-05 ENCOUNTER — Observation Stay (HOSPITAL_BASED_OUTPATIENT_CLINIC_OR_DEPARTMENT_OTHER): Payer: Medicare Other | Admitting: Anesthesiology

## 2022-08-05 ENCOUNTER — Encounter (HOSPITAL_COMMUNITY): Payer: Self-pay | Admitting: Internal Medicine

## 2022-08-05 ENCOUNTER — Observation Stay (HOSPITAL_COMMUNITY): Payer: Medicare Other

## 2022-08-05 ENCOUNTER — Encounter (HOSPITAL_COMMUNITY)
Admission: RE | Disposition: A | Discharge status: Skilled Nursing Facility | Payer: Self-pay | Source: Intra-hospital | Attending: Internal Medicine

## 2022-08-05 ENCOUNTER — Observation Stay (HOSPITAL_COMMUNITY): Payer: Medicare Other | Admitting: Anesthesiology

## 2022-08-05 ENCOUNTER — Other Ambulatory Visit: Payer: Self-pay

## 2022-08-05 DIAGNOSIS — Z87891 Personal history of nicotine dependence: Secondary | ICD-10-CM

## 2022-08-05 DIAGNOSIS — I10 Essential (primary) hypertension: Secondary | ICD-10-CM

## 2022-08-05 DIAGNOSIS — I2510 Atherosclerotic heart disease of native coronary artery without angina pectoris: Secondary | ICD-10-CM

## 2022-08-05 DIAGNOSIS — E039 Hypothyroidism, unspecified: Secondary | ICD-10-CM

## 2022-08-05 DIAGNOSIS — S72142K Displaced intertrochanteric fracture of left femur, subsequent encounter for closed fracture with nonunion: Secondary | ICD-10-CM

## 2022-08-05 DIAGNOSIS — G928 Other toxic encephalopathy: Secondary | ICD-10-CM | POA: Diagnosis not present

## 2022-08-05 DIAGNOSIS — Z96642 Presence of left artificial hip joint: Secondary | ICD-10-CM | POA: Diagnosis not present

## 2022-08-05 DIAGNOSIS — Z471 Aftercare following joint replacement surgery: Secondary | ICD-10-CM | POA: Diagnosis not present

## 2022-08-05 HISTORY — PX: TOTAL HIP ARTHROPLASTY: SHX124

## 2022-08-05 LAB — TYPE AND SCREEN
ABO/RH(D): O POS
Antibody Screen: NEGATIVE

## 2022-08-05 LAB — COMPREHENSIVE METABOLIC PANEL
ALT: 11 U/L (ref 0–44)
AST: 19 U/L (ref 15–41)
Albumin: 2.7 g/dL — ABNORMAL LOW (ref 3.5–5.0)
Alkaline Phosphatase: 101 U/L (ref 38–126)
Anion gap: 8 (ref 5–15)
BUN: 20 mg/dL (ref 8–23)
CO2: 24 mmol/L (ref 22–32)
Calcium: 8.7 mg/dL — ABNORMAL LOW (ref 8.9–10.3)
Chloride: 104 mmol/L (ref 98–111)
Creatinine, Ser: 1.15 mg/dL (ref 0.61–1.24)
GFR, Estimated: >60 mL/min (ref >60)
Glucose, Bld: 99 mg/dL (ref 70–99)
Potassium: 3.9 mmol/L (ref 3.5–5.1)
Sodium: 136 mmol/L (ref 135–145)
Total Bilirubin: 0.9 mg/dL (ref 0.3–1.2)
Total Protein: 5.7 g/dL — ABNORMAL LOW (ref 6.5–8.1)

## 2022-08-05 LAB — SURGICAL PCR SCREEN
MRSA, PCR: NEGATIVE
Staphylococcus aureus: NEGATIVE

## 2022-08-05 LAB — URINE CULTURE

## 2022-08-05 SURGERY — ARTHROPLASTY, HIP, TOTAL,POSTERIOR APPROACH
Anesthesia: General | Site: Hip | Laterality: Left

## 2022-08-05 MED ORDER — FENTANYL CITRATE (PF) 100 MCG/2ML IJ SOLN
25.0000 ug | INTRAMUSCULAR | Status: DC | PRN
  Administered 2022-08-05 (×2): 50 ug via INTRAVENOUS

## 2022-08-05 MED ORDER — CHLORHEXIDINE GLUCONATE 4 % EX LIQD
60.0000 mL | Freq: Once | CUTANEOUS | Status: AC
  Administered 2022-08-05: 4 via TOPICAL

## 2022-08-05 MED ORDER — CEFAZOLIN SODIUM-DEXTROSE 2-4 GM/100ML-% IV SOLN
INTRAVENOUS | Status: AC
  Filled 2022-08-05: qty 100

## 2022-08-05 MED ORDER — PROPOFOL 500 MG/50ML IV EMUL
INTRAVENOUS | Status: DC | PRN
  Administered 2022-08-05: 50 ug/kg/min via INTRAVENOUS

## 2022-08-05 MED ORDER — ENOXAPARIN SODIUM 40 MG/0.4ML IJ SOSY
40.0000 mg | PREFILLED_SYRINGE | INTRAMUSCULAR | Status: DC
  Administered 2022-08-06 – 2022-08-09 (×4): 40 mg via SUBCUTANEOUS
  Filled 2022-08-05 (×4): qty 0.4

## 2022-08-05 MED ORDER — BUPIVACAINE LIPOSOME 1.3 % IJ SUSP
INTRAMUSCULAR | Status: DC | PRN
  Administered 2022-08-05: 20 mL

## 2022-08-05 MED ORDER — SODIUM CHLORIDE 0.9 % IR SOLN
Status: DC | PRN
  Administered 2022-08-05: 250 mL

## 2022-08-05 MED ORDER — ENSURE ENLIVE PO LIQD
237.0000 mL | Freq: Two times a day (BID) | ORAL | Status: DC
  Administered 2022-08-06 – 2022-08-09 (×8): 237 mL via ORAL

## 2022-08-05 MED ORDER — BUPIVACAINE LIPOSOME 1.3 % IJ SUSP
INTRAMUSCULAR | Status: AC
  Filled 2022-08-05: qty 20

## 2022-08-05 MED ORDER — 0.9 % SODIUM CHLORIDE (POUR BTL) OPTIME
TOPICAL | Status: DC | PRN
  Administered 2022-08-05: 1000 mL

## 2022-08-05 MED ORDER — PHENYLEPHRINE HCL-NACL 20-0.9 MG/250ML-% IV SOLN
INTRAVENOUS | Status: DC | PRN
  Administered 2022-08-05: 50 ug/min via INTRAVENOUS

## 2022-08-05 MED ORDER — LACTATED RINGERS IV SOLN: INTRAVENOUS | Status: DC | PRN

## 2022-08-05 MED ORDER — TRANEXAMIC ACID-NACL 1000-0.7 MG/100ML-% IV SOLN
INTRAVENOUS | Status: AC
  Filled 2022-08-05: qty 100

## 2022-08-05 MED ORDER — CEFAZOLIN SODIUM-DEXTROSE 2-4 GM/100ML-% IV SOLN
2.0000 g | INTRAVENOUS | Status: AC
  Administered 2022-08-05: 2 g via INTRAVENOUS

## 2022-08-05 MED ORDER — FENTANYL CITRATE (PF) 100 MCG/2ML IJ SOLN
INTRAMUSCULAR | Status: AC
  Filled 2022-08-05: qty 2

## 2022-08-05 MED ORDER — BUPIVACAINE IN DEXTROSE 0.75-8.25 % IT SOLN
INTRATHECAL | Status: DC | PRN
  Administered 2022-08-05: 1.8 mL via INTRATHECAL

## 2022-08-05 MED ORDER — TRANEXAMIC ACID-NACL 1000-0.7 MG/100ML-% IV SOLN
1000.0000 mg | INTRAVENOUS | Status: AC
  Administered 2022-08-05: 1000 mg via INTRAVENOUS

## 2022-08-05 MED ORDER — EPHEDRINE SULFATE-NACL 50-0.9 MG/10ML-% IV SOSY
PREFILLED_SYRINGE | INTRAVENOUS | Status: DC | PRN
  Administered 2022-08-05: 10 mg via INTRAVENOUS

## 2022-08-05 MED ORDER — CHLORHEXIDINE GLUCONATE 0.12 % MT SOLN
OROMUCOSAL | Status: AC
  Administered 2022-08-05: 15 mL
  Filled 2022-08-05: qty 15

## 2022-08-05 MED ORDER — ACETAMINOPHEN 10 MG/ML IV SOLN
1000.0000 mg | Freq: Once | INTRAVENOUS | Status: DC | PRN
  Administered 2022-08-05: 1000 mg via INTRAVENOUS

## 2022-08-05 MED ORDER — FENTANYL CITRATE (PF) 250 MCG/5ML IJ SOLN
INTRAMUSCULAR | Status: AC
  Filled 2022-08-05: qty 5

## 2022-08-05 MED ORDER — ONDANSETRON HCL 4 MG/2ML IJ SOLN
INTRAMUSCULAR | Status: DC | PRN
  Administered 2022-08-05: 4 mg via INTRAVENOUS

## 2022-08-05 MED ORDER — SODIUM CHLORIDE (PF) 0.9 % IJ SOLN
INTRAMUSCULAR | Status: DC | PRN
  Administered 2022-08-05: 40 mL via INTRAVENOUS

## 2022-08-05 MED ORDER — POVIDONE-IODINE 10 % EX SWAB
2.0000 | Freq: Once | CUTANEOUS | Status: AC
  Administered 2022-08-05: 2 via TOPICAL

## 2022-08-05 MED ORDER — METHOCARBAMOL 500 MG PO TABS
500.0000 mg | ORAL_TABLET | Freq: Three times a day (TID) | ORAL | Status: AC | PRN
  Administered 2022-08-05 – 2022-08-06 (×2): 500 mg via ORAL
  Filled 2022-08-05 (×2): qty 1

## 2022-08-05 MED ORDER — ACETAMINOPHEN 10 MG/ML IV SOLN
INTRAVENOUS | Status: AC
  Filled 2022-08-05: qty 100

## 2022-08-05 MED ORDER — DEXAMETHASONE SODIUM PHOSPHATE 10 MG/ML IJ SOLN
INTRAMUSCULAR | Status: DC | PRN
  Administered 2022-08-05: 10 mg via INTRAVENOUS

## 2022-08-05 MED ORDER — FENTANYL CITRATE (PF) 250 MCG/5ML IJ SOLN
INTRAMUSCULAR | Status: DC | PRN
  Administered 2022-08-05 (×3): 50 ug via INTRAVENOUS

## 2022-08-05 MED ORDER — OXYCODONE HCL 5 MG PO TABS: 2.5000 mg | ORAL_TABLET | ORAL | Status: DC | PRN

## 2022-08-05 MED ORDER — PROPOFOL 10 MG/ML IV BOLUS
INTRAVENOUS | Status: DC | PRN
  Administered 2022-08-05: 30 mg via INTRAVENOUS
  Administered 2022-08-05: 20 mg via INTRAVENOUS

## 2022-08-05 MED ORDER — WATER FOR IRRIGATION, STERILE IR SOLN
Status: DC | PRN
  Administered 2022-08-05: 1000 mL
  Administered 2022-08-05: 1

## 2022-08-05 MED ORDER — SODIUM CHLORIDE 0.9 % IR SOLN
Status: DC | PRN
  Administered 2022-08-05: 1000 mL

## 2022-08-05 MED ORDER — GLYCOPYRROLATE PF 0.2 MG/ML IJ SOSY
PREFILLED_SYRINGE | INTRAMUSCULAR | Status: DC | PRN
  Administered 2022-08-05: .2 mg via INTRAVENOUS

## 2022-08-05 MED ORDER — OXYCODONE HCL 5 MG PO TABS
5.0000 mg | ORAL_TABLET | ORAL | Status: DC | PRN
  Administered 2022-08-05: 5 mg via ORAL
  Administered 2022-08-05 – 2022-08-06 (×5): 10 mg via ORAL
  Administered 2022-08-07 – 2022-08-09 (×6): 5 mg via ORAL
  Administered 2022-08-09: 10 mg via ORAL
  Filled 2022-08-05: qty 2
  Filled 2022-08-05: qty 1
  Filled 2022-08-05 (×2): qty 2
  Filled 2022-08-05 (×3): qty 1
  Filled 2022-08-05 (×2): qty 2
  Filled 2022-08-05 (×2): qty 1
  Filled 2022-08-05 (×2): qty 2
  Filled 2022-08-05: qty 1

## 2022-08-05 SURGICAL SUPPLY — 63 items
BLADE SAGITTAL 25.0X1.27X90 (BLADE) ×1 IMPLANT
BRUSH FEMORAL CANAL (MISCELLANEOUS) IMPLANT
CHLORAPREP W/TINT 26 (MISCELLANEOUS) ×2 IMPLANT
COVER SURGICAL LIGHT HANDLE (MISCELLANEOUS) ×1 IMPLANT
DERMABOND ADVANCED .7 DNX12 (GAUZE/BANDAGES/DRESSINGS) IMPLANT
DRAPE C-ARM 42X120 X-RAY (DRAPES) IMPLANT
DRAPE HALF SHEET 40X57 (DRAPES) ×1 IMPLANT
DRAPE HIP W/POCKET STRL (MISCELLANEOUS) ×1 IMPLANT
DRAPE INCISE IOBAN 85X60 (DRAPES) ×1 IMPLANT
DRAPE POUCH INSTRU U-SHP 10X18 (DRAPES) ×1 IMPLANT
DRAPE U-SHAPE 47X51 STRL (DRAPES) ×2 IMPLANT
DRSG AQUACEL AG ADV 3.5X10 (GAUZE/BANDAGES/DRESSINGS) ×1 IMPLANT
DRSG AQUACEL AG ADV 3.5X14 (GAUZE/BANDAGES/DRESSINGS) IMPLANT
ELECT BLADE 4.0 EZ CLEAN MEGAD (MISCELLANEOUS) ×1
ELECTRODE BLDE 4.0 EZ CLN MEGD (MISCELLANEOUS) ×1 IMPLANT
GLOVE BIOGEL PI IND STRL 8 (GLOVE) ×1 IMPLANT
GLOVE SRG 8 PF TXTR STRL LF DI (GLOVE) ×1 IMPLANT
GLOVE SURG ORTHO 8.0 STRL STRW (GLOVE) ×2 IMPLANT
GLOVE SURG UNDER POLY LF SZ8 (GLOVE) ×1
GOWN STRL REUS W/ TWL LRG LVL3 (GOWN DISPOSABLE) ×2 IMPLANT
GOWN STRL REUS W/ TWL XL LVL3 (GOWN DISPOSABLE) ×1 IMPLANT
GOWN STRL REUS W/TWL LRG LVL3 (GOWN DISPOSABLE) ×2
GOWN STRL REUS W/TWL XL LVL3 (GOWN DISPOSABLE) ×1
HANDPIECE INTERPULSE COAX TIP (DISPOSABLE) ×1
HEAD BIOLOX HIP 36/+2.5 (Joint) IMPLANT
HIP BIOLOX HD 36/+2.5 (Joint) ×1 IMPLANT
HIP MODULAR RESTORE 155X20MM (Hips) ×1 IMPLANT
HOOD PEEL AWAY T7 (MISCELLANEOUS) ×3 IMPLANT
INSERT TRIDENT G 36 0D (Insert) IMPLANT
KIT BASIN OR (CUSTOM PROCEDURE TRAY) ×1 IMPLANT
KIT TURNOVER KIT A (KITS) ×1 IMPLANT
MANIFOLD NEPTUNE II (INSTRUMENTS) ×1 IMPLANT
MARKER SKIN DUAL TIP RULER LAB (MISCELLANEOUS) ×1 IMPLANT
NAIL EXTRACTOR DISP (INSTRUMENTS) IMPLANT
NDL 18GX1X1/2 (RX/OR ONLY) (NEEDLE) ×1 IMPLANT
NEEDLE 18GX1X1/2 (RX/OR ONLY) (NEEDLE) ×1 IMPLANT
NS IRRIG 1000ML POUR BTL (IV SOLUTION) ×1 IMPLANT
PACK TOTAL JOINT (CUSTOM PROCEDURE TRAY) ×1 IMPLANT
REST MOD HIP SYS SZ23 +0 V40 (Hips) ×1 IMPLANT
RETRIEVER SUT HEWSON (MISCELLANEOUS) ×1 IMPLANT
SCREW HEX LP 6.5X20 (Screw) IMPLANT
SCREW HEX LP 6.5X30 (Screw) IMPLANT
SEALER BIPOLAR AQUA 6.0 (INSTRUMENTS) ×1 IMPLANT
SET HNDPC FAN SPRY TIP SCT (DISPOSABLE) ×1 IMPLANT
SHELL TRIDENT II CLUST 60G ACE (Shell) IMPLANT
SLEEVE CABLE 2MM VT (Orthopedic Implant) IMPLANT
SOLUTION IRRIG SURGIPHOR (IV SOLUTION) ×1 IMPLANT
SUCTION FRAZIER HANDLE 10FR (MISCELLANEOUS) ×1
SUCTION TUBE FRAZIER 10FR DISP (MISCELLANEOUS) ×1 IMPLANT
SUT BONE WAX W31G (SUTURE) ×1 IMPLANT
SUT ETHIBOND 2 OS 4 DA (SUTURE) IMPLANT
SUT MNCRL AB 3-0 PS2 18 (SUTURE) ×1 IMPLANT
SUT STRATAFIX 1PDS 45CM VIOLET (SUTURE) ×2 IMPLANT
SUT VIC AB 0 CT1 27 (SUTURE) ×3
SUT VIC AB 0 CT1 27XBRD ANBCTR (SUTURE) ×1 IMPLANT
SUT VIC AB 2-0 CT2 27 (SUTURE) ×2 IMPLANT
SYR 20ML LL LF (SYRINGE) ×1 IMPLANT
SYR 50ML LL SCALE MARK (SYRINGE) ×1 IMPLANT
SYSTEM HIP MDLR RESTR 155X20MM (Hips) IMPLANT
SYSTEM REST MOD HIP SZ23+0 V40 (Hips) IMPLANT
TOWEL GREEN STERILE (TOWEL DISPOSABLE) ×1 IMPLANT
TUBE SUCT ARGYLE STRL (TUBING) ×1 IMPLANT
WATER STERILE IRR 1000ML POUR (IV SOLUTION) ×1 IMPLANT

## 2022-08-05 NOTE — Anesthesia Postprocedure Evaluation (Signed)
Anesthesia Post Note  Patient: TARIF MCMANAWAY  Procedure(s) Performed: TOTAL HIP ARTHROPLASTY (Left: Hip)     Patient location during evaluation: PACU Anesthesia Type: MAC and Spinal Level of consciousness: awake and alert Pain management: pain level controlled Vital Signs Assessment: post-procedure vital signs reviewed and stable Respiratory status: spontaneous breathing, nonlabored ventilation, respiratory function stable and patient connected to nasal cannula oxygen Cardiovascular status: stable and blood pressure returned to baseline Postop Assessment: no apparent nausea or vomiting Anesthetic complications: no   No notable events documented.  Last Vitals:  Vitals:   08/05/22 1155 08/05/22 1221  BP: (!) 108/56 116/61  Pulse: 70 72  Resp: 17 14  Temp: 36.4 C   SpO2: 91% 98%    Last Pain:  Vitals:   08/05/22 1732  TempSrc:   PainSc: 4                  Keylin Podolsky P Erek Kowal

## 2022-08-05 NOTE — Op Note (Addendum)
08/05/2022  11:22 AM  PATIENT:  Timothy Spencer   MRN: [id number]  PRE-OPERATIVE DIAGNOSIS: Left intertrochanteric fracture nonunion  POST-OPERATIVE DIAGNOSIS:  same  PROCEDURE:  Procedure(s): Removal of Smith & Nephew cephalomedullary nail conversion to total hip arthroplasty with revision femoral component  PREOPERATIVE INDICATIONS:    Timothy Spencer is an 82 y.o. male who stained an intertrochanteric fracture in early December underwent ORIF with cephalomedullary nail.  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  He also was developing increasing pain and confusion so was brought into the hospital.  Workup demonstrated urinary tract infection.  Given the severe pain and limited mobility he has at this point advised for conversion total hip arthroplasty during this hospitalization.  The risks benefits and alternatives were discussed with the patient including but not limited to the risks of nonoperative treatment, versus surgical intervention including infection, bleeding, nerve injury, periprosthetic fracture, the need for revision surgery, dislocation, leg length discrepancy, blood clots, cardiopulmonary complications, morbidity, mortality, among others, and they were willing to proceed.     OPERATIVE REPORT     SURGEON:  Charlies Constable, MD    ASSISTANT: Dorise Bullion, PA-C, (Present throughout the entire procedure,  necessary for completion of procedure in a timely manner, assisting with retraction, instrumentation, and closure)     ANESTHESIA: Spinal  ESTIMATED BLOOD LOSS: 0000000    COMPLICATIONS:  None.   COMPONENTS:   Stryker Trident 260 mm acetabular cluster hole shell, neutral Trident X.3 polyethylene liner, restoration modular 155 x 20 mm distal stem, 23 Implant Name Type Inv. Item Serial No. Manufacturer Lot No. LRB No. Used Action  SLEEVE CABLE 2MM VT - [id number] Orthopedic Implant SLEEVE  CABLE 2MM VT  [employer] [id number] Left 1 Implanted  SHELL TRIDENT II CLUST 60G ACE - [id number] Shell SHELL TRIDENT II CLUST 60G ACE  [employer] [id number] A Left 1 Implanted  SCREW HEX LP 6.5X30 - [id number] Screw SCREW HEX LP 6.5X30  [employer] [id number] Left 1 Implanted  SCREW HEX LP 6.5X20 - [id number] Screw SCREW HEX LP 6.5X20  [employer] [id number] Left 1 Implanted  INSERT TRIDENT G 36 0D - [id number] Insert INSERT TRIDENT G 36 0D  [employer] [id number] Left 1 Implanted  HIP MODULAR RESTORE 155X20MM - [id number] Hips HIP MODULAR RESTORE 155X20MM  [employer] [id number] A Left 1 Implanted  REST MOD HIP SYS SZ23 +0 V40 - [id number] Hips REST MOD HIP SYS SZ23 +0 V40  [employer] [id number] Left 1 Implanted  HIP BIOLOX HD 36/+2.5 - [id number] Joint HIP BIOLOX HD 36/+2.5  [employer] [id number] Left 1 Implanted      The patient was met in the holding area and  identified.  The appropriate hip was identified and marked at the operative site.  The patient was then transported to the OR  and  placed under anesthesia.  At that point, the patient was  placed in the lateral decubitus position with the operative side up and  secured to the operating room table  and all bony prominences padded. A subaxillary role was also placed.    The operative lower extremity was prepped from the iliac crest to the distal leg.  Sterile draping was performed.  Preoperative antibiotics, 2 gm of ancef,1 gm of Tranexamic Acid, and 8 mg of Decadron administered. Time out was performed prior to incision.      A routine posterolateral approach was utilized via sharp dissection  carried down  to the subcutaneous tissue.  The old incisions from this left was incorporated into the new posterior lateral incision.   Gross bleeders were Bovie coagulated.  The iliotibial band was identified and incised along the length of the skin incision through the glute max fascia.  Charnley retractor was  placed with care to protect the sciatic nerve posteriorly.  With the hip internally rotated, the piriformis tendon was identified and released from the femoral insertion and tagged with a #2 Ethibond.  A capsulotomy was then performed off the femoral insertion and also tagged with a #2 Ethibond.   The hip was carefully atraumatically dislocated and then reduced.  A 2.0 Dall-Miles cable was then placed circumferentially just below the lesser trochanter to avoid risk of possible iatrogenic fracture propagation. The Tamala Julian and nephew nail was then removed.  The old scar distally for the distal interlocks was incised and dissected down to the screws.  Removed without difficulty.  Next we turned our attention to removing the cephallomedullary screw and the nail.  The cephalomedullary screw was removed first and the nail came out without any difficulty. The hip was then dislocated.  The remaining neck was significantly shortened.  A neck cut was made just above the lesser trochanter.   I then exposed the deep acetabulum, cleared out any tissue including the ligamentum teres.  After adequate visualization, I excised the labrum.  I then started reaming with a 54 mm reamer, first medializing to the floor of the cotyloid fossa, and then in the position of the cup aiming towards the greater sciatic notch, matching the version of the transverse acetabular ligament and tucked under the anterior wall. I reamed up to 60 mm reamer with good bony bed preparation and a 60 mm cup was chosen.  The real cup was then impacted into place.  Appropriate version and inclination was confirmed clinically matching their bony anatomy, and also with the use of the jig.  I placed 2 screws in the posterior superior quadrant to augment fixation.   A neutral liner was placed and impacted. It was confirmed to be appropriately seated and the acetabular retractors were removed.    I then prepared the proximal femur using the box cutter, Charnley  awl, there was greater trochanter was significantly overhanging so had to use multiple lateralizing reamers to get a straight shot down the femoral canal.  Fluoroscopy was used intermittently to confirm appropriate position of the reamers down the canal.  Been significant lateralization into the trochanter the posterior aspect of the abductors had to be mostly released off the trochanter.   We then reamed sequentially up to a 20 mm reamer which had good cortical fit and excellent position on fluoroscopy in the AP and lateral position.  We then opened the 20 mm x 155 mm distal stem.  This was impacted.  Excellent axial and rotational stability.  Then reamed the proximal body up to a 23 mm proximal body based on preoperative templating.  We initially anteverted the femoral component about 20 degrees and sequentially trialed up to a +2.5 femoral head which had good restoration of leg length.  The hip was stable at the position of sleep and with 90 degrees flexion and 80 degrees of internal rotation.  Leg lengths were also clinically assessed in the lateral position and felt to be equal. Intra-Op flatplate was obtained and confirmed appropriate component positions.  Good fill of the femur, and restoration of leg length and offset. No evidence or concern for  fracture.   The real proximal body was then opened and impacted.  Secured with the screw.  A +2.5 mm femoral head was again trialed. The hip was then reduced and taken through a range of motion. There was no impingement with full extension and 90 degrees external rotation.  The hip was stable at the position of sleep and with 90 degrees flexion and 80 degrees of internal rotation. Leg lengths were  again assessed and felt to be restored.  We then opened, and I impacted the real head ball into place. The posterior capsule was then closed with #2 Ethibond.   I then irrigated the hip copiously with dilute Betadine and with normal saline pulse lavage. Periarticular  injection was then performed with Exparel.   We repaired the fascia #1 barbed suture, followed by 0 barbed suture for the subcutaneous fat.  Skin was closed with 2-0 Vicryl and 3-0 Monocryl.  Dermabond and Aquacel dressing were applied. The patient was then awakened and returned to PACU in stable and satisfactory condition.  Leg lengths in the supine position were assessed and felt to be clinically equal. There were no complications.   Post op recs: WB: 50% PWB LLE, posterior hip precautions x6 weeks Abx: ancef while in house, currently on scheduled ceftriaxone for UTI  Imaging: PACU pelvis Xray Dressing: Aquacell, keep intact until follow up DVT prophylaxis: Lovenox 40 daily starting postop day 1  Follow up: 2 weeks after surgery for a wound check with Dr. Zachery Dakins at [facility].  Address: [address], [place], [place] [address]  Office Phone: [phone]     Charlies Constable, MD Orthopedic Surgeon

## 2022-08-05 NOTE — Transfer of Care (Signed)
Immediate Anesthesia Transfer of Care Note  Patient: Timothy Spencer  Procedure(s) Performed: TOTAL HIP ARTHROPLASTY (Left: Hip)  Patient Location: PACU  Anesthesia Type:MAC combined with regional for post-op pain  Level of Consciousness: awake  Airway & Oxygen Therapy: Patient Spontanous Breathing  Post-op Assessment: Report given to RN and Post -op Vital signs reviewed and stable  Post vital signs: Reviewed and stable  Last Vitals:  Vitals Value Taken Time  BP 108/56 08/05/22 1155  Temp 36.4 C 08/05/22 1155  Pulse 69 08/05/22 1158  Resp 13 08/05/22 1158  SpO2 92 % 08/05/22 1158  Vitals shown include unvalidated device data.  Last Pain:  Vitals:   08/05/22 1215  TempSrc:   PainSc: Asleep         Complications: No notable events documented.

## 2022-08-05 NOTE — Progress Notes (Signed)
  Progress Note   Patient: Timothy Spencer OYD:[id number] DOB: July 04, 1940 DOA: 08/03/2022     1 DOS: the patient was seen and examined on 08/05/2022   Brief hospital course: 82 y.o. male with medical history significant of copd, CAD, CKD3a, RBBB , HTN, hypothyroid, BPH with chronic indwelling cath presents as a direct admit from home with delirium that started on the AM of presentation. Pt had surgery for impated L intertrochanteric fracture on 12/23. Noted to have gradually worsening pain at site. 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 On the AM of presentation, pt was noted to be more confused and lethargic. Pt's wife left pt home alone w/ neighbor to tell orthopod in the office. Orthopedic Surgery called hospitalist for consideration for direct admit. Hospitalist agreed to direct admit from home. Of note, pt has been with indwelling cath since 12/23, followed by Urology for failed voiding trials. Family reports very cloudy urine leading up to visit   Assessment and Plan:  Delirium -Mentation seems much improved on arrival from home -UA is strongly suggestive of UTI, see below -pt is continued on below antibiotic  UTI related to chronic foley cath -Pt has hx of BPH and chronic bladder outlet obstruction, requiring foley and followed by Urology as outpatient -UA is highly suggestive of UTI with leuks and nitrates -Urine cx shows multiple species present -Would complete empiric abx x 5 days -Have changed out foley cath 3/8   Hx L femoral fracture -Followed by Orthopedic Surgery and is s/p surgery in 12/23 -pt underwent surgery to correct L intertrochanteric fracture nonunion 3/9 -f/u with therapy recs   CAD -remains stable at present -BP meds were held secondary to soft bp at presentation -BP remains very stable   CKD 3a -Last reported Cr of 1.46 from 12/23 -Cr improved to 1.15   COPD -no wheezing on exam -on minimal O2 support    HTN -BP remains stable at this time -remains stable off bp meds for now. BP is very well controlled   HLD -continue statin per home regimen    BPH -Followed by Urology  -Family reports pt failed multiple voiding trials -changed foley 3/8 given UTI    Subjective: Without complaints this AM  Physical Exam: Vitals:   08/05/22 1125 08/05/22 1140 08/05/22 1155 08/05/22 1221  BP: 109/60 (!) 109/58 (!) 108/56 116/61  Pulse: (!) 53 63 70 72  Resp: 13 (!) 25 17 14   Temp: (!) 97.2 F (36.2 C)  97.6 F (36.4 C)   TempSrc:      SpO2: 98% 96% 91% 98%   General exam: Conversant, in no acute distress Respiratory system: normal chest rise, clear, no audible wheezing Cardiovascular system: regular rhythm, s1-s2 Gastrointestinal system: Nondistended, nontender, pos BS Central nervous system: No seizures, no tremors Extremities: No cyanosis, no joint deformities Skin: No rashes, no pallor Psychiatry: Affect normal // no auditory hallucinations   Data Reviewed:  Labs reviewed: Na 136, K 3.9, Cr 1.15  Family Communication: Pt in room, family not at bedside  Disposition: Status is: Observation The patient remains OBS appropriate and will d/c before 2 midnights.  Planned Discharge Destination:  Unclear at this time    Author: Marylu Lund, MD 08/05/2022 4:22 PM  For on call review [email or web address].

## 2022-08-05 NOTE — Interval H&P Note (Signed)
The patient has been re-examined, and the chart reviewed, and there have been no interval changes to the documented history and physical.    Patient admitted to the hospital found to have UTI.  Also with nonunion of intertrochanteric fracture and doing very poorly at home.  He has responded well to antibiotics for the urinary tract infection.  Given severe pain and limited mobility at home we will plan for conversion to total hip arthroplasty today.  Patient otherwise medically optimized.  The operative side was examined and the patient was confirmed to have sensation to DPN, SPN, TN intact, Motor EHL, ext, flex 5/5, and DP 2+, PT 2+, No significant edema.  Well-healed incisions from previous surgery.  The risks, benefits, and alternatives have been discussed at length with patient, and the patient is willing to proceed.  Left hip marked. Consent has been signed.

## 2022-08-05 NOTE — Anesthesia Procedure Notes (Signed)
Spinal  Patient location during procedure: OR Start time: 08/05/2022 8:10 AM End time: 08/05/2022 8:13 AM Staffing Performed: anesthesiologist  Anesthesiologist: Darral Dash, DO Performed by: Darral Dash, DO Authorized by: Darral Dash, DO   Preanesthetic Checklist Completed: patient identified, IV checked, site marked, risks and benefits discussed, surgical consent, monitors and equipment checked, pre-op evaluation and timeout performed Spinal Block Patient position: right lateral decubitus Prep: DuraPrep Patient monitoring: heart rate, cardiac monitor, continuous pulse ox and blood pressure Approach: midline Location: L4-5 Injection technique: single-shot Needle Needle type: Pencan  Needle gauge: 24 G Needle length: 10 cm Assessment Events: CSF return Additional Notes Patient identified. Risks/Benefits/Options discussed with patient including but not limited to bleeding, infection, nerve damage, paralysis, failed block, incomplete pain control, headache, blood pressure changes, nausea, vomiting, reactions to medications, itching and postpartum back pain. Confirmed with bedside nurse the patient's most recent platelet count. Confirmed with patient that they are not currently taking any anticoagulation, have any bleeding history or any family history of bleeding disorders. Patient expressed understanding and wished to proceed. All questions were answered. Sterile technique was used throughout the entire procedure. Please see nursing notes for vital signs. Warning signs of high block given to the patient including shortness of breath, tingling/numbness in hands, complete motor block, or any concerning symptoms with instructions to call for help. Patient was given instructions on fall risk and not to get out of bed. All questions and concerns addressed with instructions to call with any issues or inadequate analgesia.

## 2022-08-05 NOTE — Discharge Instructions (Addendum)
INSTRUCTIONS AFTER JOINT REPLACEMENT   Remove items at home which could result in a fall. This includes throw rugs or furniture in walking pathways ICE to the affected joint every three hours while awake for 30 minutes at a time, for at least the first 3-5 days, and then as needed for pain and swelling.  Continue to use ice for pain and swelling. You may notice swelling that will progress down to the foot and ankle.  This is normal after surgery.  Elevate your leg when you are not up walking on it.   Continue to use the breathing machine you got in the hospital (incentive spirometer) which will help keep your temperature down.  It is common for your temperature to cycle up and down following surgery, especially at night when you are not up moving around and exerting yourself.  The breathing machine keeps your lungs expanded and your temperature down.   DIET:  As you were doing prior to hospitalization, we recommend a well-balanced diet.  DRESSING / WOUND CARE / SHOWERING  Keep the surgical dressing until follow up.  The dressing is water proof, so you can shower without any extra covering.  IF THE DRESSING FALLS OFF or the wound gets wet inside, change the dressing with sterile gauze.  Please use good hand washing techniques before changing the dressing.  Do not use any lotions or creams on the incision until instructed by your surgeon.    ACTIVITY  Increase activity slowly as tolerated, but follow the weight bearing instructions below.   No driving for 6 weeks or until further direction given by your physician.  You cannot drive while taking narcotics.  No lifting or carrying greater than 10 lbs. until further directed by your surgeon. Avoid periods of inactivity such as sitting longer than an hour when not asleep. This helps prevent blood clots.  You may return to work once you are authorized by your doctor.     WEIGHT BEARING   Weight bearing as tolerated with assist device (walker, cane,  etc) as directed, use it as long as suggested by your surgeon or therapist, typically at least 4-6 weeks.   EXERCISES  Results after joint replacement surgery are often greatly improved when you follow the exercise, range of motion and muscle strengthening exercises prescribed by your doctor. Safety measures are also important to protect the joint from further injury. Any time any of these exercises cause you to have increased pain or swelling, decrease what you are doing until you are comfortable again and then slowly increase them. If you have problems or questions, call your caregiver or physical therapist for advice.   Rehabilitation is important following a joint replacement. After just a few days of immobilization, the muscles of the leg can become weakened and shrink (atrophy).  These exercises are designed to build up the tone and strength of the thigh and leg muscles and to improve motion. Often times heat used for twenty to thirty minutes before working out will loosen up your tissues and help with improving the range of motion but do not use heat for the first two weeks following surgery (sometimes heat can increase post-operative swelling).   These exercises can be done on a training (exercise) mat, on the floor, on a table or on a bed. Use whatever works the best and is most comfortable for you.    Use music or television while you are exercising so that the exercises are a pleasant break in your   day. This will make your life better with the exercises acting as a break in your routine that you can look forward to.   Perform all exercises about fifteen times, three times per day or as directed.  You should exercise both the operative leg and the other leg as well.  Exercises include:   Quad Sets - Tighten up the muscle on the front of the thigh (Quad) and hold for 5-10 seconds.   Straight Leg Raises - With your knee straight (if you were given a brace, keep it on), lift the leg to 60  degrees, hold for 3 seconds, and slowly lower the leg.  Perform this exercise against resistance later as your leg gets stronger.  Leg Slides: Lying on your back, slowly slide your foot toward your buttocks, bending your knee up off the floor (only go as far as is comfortable). Then slowly slide your foot back down until your leg is flat on the floor again.  Angel Wings: Lying on your back spread your legs to the side as far apart as you can without causing discomfort.  Hamstring Strength:  Lying on your back, push your heel against the floor with your leg straight by tightening up the muscles of your buttocks.  Repeat, but this time bend your knee to a comfortable angle, and push your heel against the floor.  You may put a pillow under the heel to make it more comfortable if necessary.   A rehabilitation program following joint replacement surgery can speed recovery and prevent re-injury in the future due to weakened muscles. Contact your doctor or a physical therapist for more information on knee rehabilitation.    CONSTIPATION  Constipation is defined medically as fewer than three stools per week and severe constipation as less than one stool per week.  Even if you have a regular bowel pattern at home, your normal regimen is likely to be disrupted due to multiple reasons following surgery.  Combination of anesthesia, postoperative narcotics, change in appetite and fluid intake all can affect your bowels.   YOU MUST use at least one of the following options; they are listed in order of increasing strength to get the job done.  They are all available over the counter, and you may need to use some, POSSIBLY even all of these options:    Drink plenty of fluids (prune juice may be helpful) and high fiber foods Colace 100 mg by mouth twice a day  Senokot for constipation as directed and as needed Dulcolax (bisacodyl), take with full glass of water  Miralax (polyethylene glycol) once or twice a day as  needed.  If you have tried all these things and are unable to have a bowel movement in the first 3-4 days after surgery call either your surgeon or your primary doctor.    If you experience loose stools or diarrhea, hold the medications until you stool forms back up.  If your symptoms do not get better within 1 week or if they get worse, check with your doctor.  If you experience "the worst abdominal pain ever" or develop nausea or vomiting, please contact the office immediately for further recommendations for treatment.   ITCHING:  If you experience itching with your medications, try taking only a single pain pill, or even half a pain pill at a time.  You can also use Benadryl over the counter for itching or also to help with sleep.   TED HOSE STOCKINGS:  Use stockings on both   legs until for at least 2 weeks or as directed by physician office. They may be removed at night for sleeping.  MEDICATIONS:  See your medication summary on the "After Visit Summary" that nursing will review with you.  You may have some home medications which will be placed on hold until you complete the course of blood thinner medication.  It is important for you to complete the blood thinner medication as prescribed.   Blood clot prevention (DVT Prophylaxis): After surgery you are at an increased risk for a blood clot. you were prescribed a blood thinner, Lovenox '40mg'$ , to be taken daily for a total of 4 weeks from surgery to help reduce your risk of getting a blood clot. This will help prevent a blood clot. Signs of a pulmonary embolus (blood clot in the lungs) include sudden short of breath, feeling lightheaded or dizzy, chest pain with a deep breath, rapid pulse rapid breathing. Signs of a blood clot in your arms or legs include new unexplained swelling and cramping, warm, red or darkened skin around the painful area. Please call the office or 911 right away if these signs or symptoms develop.  PRECAUTIONS:  If you  experience chest pain or shortness of breath - call 911 immediately for transfer to the hospital emergency department.   If you develop a fever greater that 101 F, purulent drainage from wound, increased redness or drainage from wound, foul odor from the wound/dressing, or calf pain - CONTACT YOUR SURGEON.                                                   FOLLOW-UP APPOINTMENTS:  If you do not already have a post-op appointment, please call the office for an appointment to be seen by your surgeon.  Guidelines for how soon to be seen are listed in your "After Visit Summary", but are typically between 2-3 weeks after surgery.   POST-OPERATIVE OPIOID TAPER INSTRUCTIONS: It is important to wean off of your opioid medication as soon as possible. If you do not need pain medication after your surgery it is ok to stop day one. Opioids include: Codeine, Hydrocodone(Norco, Vicodin), Oxycodone(Percocet, oxycontin) and hydromorphone amongst others.  Long term and even short term use of opiods can cause: Increased pain response Dependence Constipation Depression Respiratory depression And more.  Withdrawal symptoms can include Flu like symptoms Nausea, vomiting And more Techniques to manage these symptoms Hydrate well Eat regular healthy meals Stay active Use relaxation techniques(deep breathing, meditating, yoga) Do Not substitute Alcohol to help with tapering If you have been on opioids for less than two weeks and do not have pain than it is ok to stop all together.  Plan to wean off of opioids This plan should start within one week post op of your joint replacement. Maintain the same interval or time between taking each dose and first decrease the dose.  Cut the total daily intake of opioids by one tablet each day Next start to increase the time between doses. The last dose that should be eliminated is the evening dose.   MAKE SURE YOU:  Understand these instructions.  Get help right away  if you are not doing well or get worse.    Thank you for letting us be a part of your medical care team.  It is a privilege we  we respect greatly.  We hope these instructions will help you stay on track for a fast and full recovery!         

## 2022-08-05 NOTE — Plan of Care (Signed)
  Problem: Activity: Goal: Risk for activity intolerance will decrease Outcome: Progressing   Problem: Nutrition: Goal: Adequate nutrition will be maintained Outcome: Progressing   Problem: Pain Managment: Goal: General experience of comfort will improve Outcome: Progressing   

## 2022-08-06 DIAGNOSIS — J439 Emphysema, unspecified: Secondary | ICD-10-CM | POA: Diagnosis not present

## 2022-08-06 DIAGNOSIS — E039 Hypothyroidism, unspecified: Secondary | ICD-10-CM | POA: Diagnosis not present

## 2022-08-06 DIAGNOSIS — Z87891 Personal history of nicotine dependence: Secondary | ICD-10-CM | POA: Diagnosis not present

## 2022-08-06 DIAGNOSIS — N390 Urinary tract infection, site not specified: Secondary | ICD-10-CM | POA: Diagnosis not present

## 2022-08-06 DIAGNOSIS — G4733 Obstructive sleep apnea (adult) (pediatric): Secondary | ICD-10-CM | POA: Diagnosis present

## 2022-08-06 DIAGNOSIS — I2510 Atherosclerotic heart disease of native coronary artery without angina pectoris: Secondary | ICD-10-CM | POA: Diagnosis present

## 2022-08-06 DIAGNOSIS — G2581 Restless legs syndrome: Secondary | ICD-10-CM | POA: Diagnosis not present

## 2022-08-06 DIAGNOSIS — Z888 Allergy status to other drugs, medicaments and biological substances status: Secondary | ICD-10-CM | POA: Diagnosis not present

## 2022-08-06 DIAGNOSIS — G928 Other toxic encephalopathy: Secondary | ICD-10-CM | POA: Diagnosis not present

## 2022-08-06 DIAGNOSIS — N1831 Chronic kidney disease, stage 3a: Secondary | ICD-10-CM | POA: Diagnosis not present

## 2022-08-06 DIAGNOSIS — Z79899 Other long term (current) drug therapy: Secondary | ICD-10-CM | POA: Diagnosis not present

## 2022-08-06 DIAGNOSIS — N401 Enlarged prostate with lower urinary tract symptoms: Secondary | ICD-10-CM | POA: Diagnosis present

## 2022-08-06 DIAGNOSIS — T83518A Infection and inflammatory reaction due to other urinary catheter, initial encounter: Secondary | ICD-10-CM | POA: Diagnosis not present

## 2022-08-06 DIAGNOSIS — K219 Gastro-esophageal reflux disease without esophagitis: Secondary | ICD-10-CM | POA: Diagnosis present

## 2022-08-06 DIAGNOSIS — D631 Anemia in chronic kidney disease: Secondary | ICD-10-CM | POA: Diagnosis not present

## 2022-08-06 DIAGNOSIS — R410 Disorientation, unspecified: Secondary | ICD-10-CM | POA: Diagnosis present

## 2022-08-06 DIAGNOSIS — T83511S Infection and inflammatory reaction due to indwelling urethral catheter, sequela: Secondary | ICD-10-CM | POA: Diagnosis not present

## 2022-08-06 DIAGNOSIS — F32A Depression, unspecified: Secondary | ICD-10-CM | POA: Diagnosis not present

## 2022-08-06 DIAGNOSIS — Y846 Urinary catheterization as the cause of abnormal reaction of the patient, or of later complication, without mention of misadventure at the time of the procedure: Secondary | ICD-10-CM | POA: Diagnosis present

## 2022-08-06 DIAGNOSIS — J449 Chronic obstructive pulmonary disease, unspecified: Secondary | ICD-10-CM | POA: Diagnosis not present

## 2022-08-06 DIAGNOSIS — S72142K Displaced intertrochanteric fracture of left femur, subsequent encounter for closed fracture with nonunion: Secondary | ICD-10-CM | POA: Diagnosis not present

## 2022-08-06 DIAGNOSIS — E785 Hyperlipidemia, unspecified: Secondary | ICD-10-CM | POA: Diagnosis not present

## 2022-08-06 DIAGNOSIS — I4510 Unspecified right bundle-branch block: Secondary | ICD-10-CM | POA: Diagnosis present

## 2022-08-06 DIAGNOSIS — F419 Anxiety disorder, unspecified: Secondary | ICD-10-CM | POA: Diagnosis present

## 2022-08-06 DIAGNOSIS — I129 Hypertensive chronic kidney disease with stage 1 through stage 4 chronic kidney disease, or unspecified chronic kidney disease: Secondary | ICD-10-CM | POA: Diagnosis not present

## 2022-08-06 DIAGNOSIS — W19XXXD Unspecified fall, subsequent encounter: Secondary | ICD-10-CM | POA: Diagnosis present

## 2022-08-06 LAB — BASIC METABOLIC PANEL
Anion gap: 10 (ref 5–15)
BUN: 24 mg/dL — ABNORMAL HIGH (ref 8–23)
CO2: 24 mmol/L (ref 22–32)
Calcium: 8.8 mg/dL — ABNORMAL LOW (ref 8.9–10.3)
Chloride: 103 mmol/L (ref 98–111)
Creatinine, Ser: 1.24 mg/dL (ref 0.61–1.24)
GFR, Estimated: 58 mL/min — ABNORMAL LOW (ref >60)
Glucose, Bld: 115 mg/dL — ABNORMAL HIGH (ref 70–99)
Potassium: 4.6 mmol/L (ref 3.5–5.1)
Sodium: 137 mmol/L (ref 135–145)

## 2022-08-06 LAB — CBC
HCT: 33.2 % — ABNORMAL LOW (ref 39.0–52.0)
Hemoglobin: 11.2 g/dL — ABNORMAL LOW (ref 13.0–17.0)
MCH: 29.2 pg (ref 26.0–34.0)
MCHC: 33.7 g/dL (ref 30.0–36.0)
MCV: 86.7 fL (ref 80.0–100.0)
Platelets: 152 10*3/uL (ref 150–400)
RBC: 3.83 MIL/uL — ABNORMAL LOW (ref 4.22–5.81)
RDW: 14.3 % (ref 11.5–15.5)
WBC: 11.2 10*3/uL — ABNORMAL HIGH (ref 4.0–10.5)
nRBC: 0 % (ref 0.0–0.2)

## 2022-08-06 MED ORDER — POLYETHYLENE GLYCOL 3350 17 G PO PACK: 17.0000 g | PACK | Freq: Every day | ORAL | Status: DC | PRN

## 2022-08-06 NOTE — Progress Notes (Signed)
   ORTHOPAEDIC PROGRESS NOTE  s/p Procedure(s): TOTAL HIP ARTHROPLASTY  SUBJECTIVE: Reports moderate pain about operative site. Asking how long he will be in the hospital.   No chest pain. No SOB. No nausea/vomiting. No other complaints.  OBJECTIVE: PE: General: sitting up in hospital bed, NAD LLE: dressing CDI, leg lengths equal, intact EHL/TA/GSC, endorses distal sensation, warm well perfused foot   Vitals:   08/06/22 0605 08/06/22 0834  BP: 124/68 135/61  Pulse: 82 85  Resp: 16 18  Temp: (!) 97.5 F (36.4 C) 98.3 F (36.8 C)  SpO2: 96% 93%   Stable post-op images.   ASSESSMENT: Timothy Spencer is a 82 y.o. male POD#1  PLAN: Weightbearing: 50% PWB LLE, posterior hip precautions x6 weeks  Insicional and dressing care: Reinforce dressings as needed Orthopedic device(s): None Showering: Hold for now VTE prophylaxis:  Lovenox 40 daily   Pain control: PRN pain medications, minimize narcotics as able  Abx: ancef given preop, follow up intra-op cxs, currently on scheduled ceftriaxone for UTI  Imaging: PACU pelvis Xray Follow - up plan: 2 weeks after surgery for a wound check with Dr. Zachery Dakins at [facility]: TBD. PT/OT evals today. Likely SNF.   Contact information:  After hours and holidays please check [email or web address] for group call information for [facility]  Noemi Chapel, PA-C 08/06/2022

## 2022-08-06 NOTE — Progress Notes (Signed)
  Progress Note   Patient: Timothy Spencer YWV:[id number] DOB: 04-05-1941 DOA: 08/03/2022     1 DOS: the patient was seen and examined on 08/06/2022   Brief hospital course: 82 y.o. male with medical history significant of copd, CAD, CKD3a, RBBB , HTN, hypothyroid, BPH with chronic indwelling cath presents as a direct admit from home with delirium that started on the AM of presentation. Pt had surgery for impated L intertrochanteric fracture on 12/23. Noted to have gradually worsening pain at site. 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 On the AM of presentation, pt was noted to be more confused and lethargic. Pt's wife left pt home alone w/ neighbor to tell orthopod in the office. Orthopedic Surgery called hospitalist for consideration for direct admit. Hospitalist agreed to direct admit from home. Of note, pt has been with indwelling cath since 12/23, followed by Urology for failed voiding trials. Family reports very cloudy urine leading up to visit   Assessment and Plan:  Delirium -Mentation seems much improved on arrival from home -UA is strongly suggestive of UTI, see below  UTI related to chronic foley cath -Pt has hx of BPH and chronic bladder outlet obstruction, requiring foley and followed by Urology as outpatient -UA is highly suggestive of UTI with leuks and nitrates -Urine culture is inconclusive  -Tolerating rocephin. 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 BP is very well controlled   HLD -continue statin per home regimen    BPH -Followed by Urology  -Family reports pt failed multiple voiding trials -changed foley 3/8 given UTI -would have pt f/u with Urology   Subjective: No complaints this AM  Physical Exam: Vitals:   08/05/22 2251 08/06/22 0100 08/06/22 0605 08/06/22 0834  BP: 110/67 131/71 124/68 135/61  Pulse: 96 91 82 85  Resp: 16 16 16 18   Temp: 98.1 F (36.7 C) 98.1 F (36.7 C) (!) 97.5 F (36.4 C) 98.3 F (36.8 C)  TempSrc: Oral Oral Oral   SpO2: 95% 96% 96% 93%   General exam: Awake, laying in bed, in nad Respiratory system: Normal respiratory effort, no wheezing Cardiovascular system: regular rate, s1, s2 Gastrointestinal system: Soft, nondistended, positive BS Central nervous system: CN2-12 grossly intact, strength intact Extremities: Perfused, no clubbing Skin: Normal skin turgor, no notable skin lesions seen Psychiatry: Mood normal // no visual hallucinations   Data Reviewed:  Labs reviewed: Na 137, K 4.6, Cr 1.24, WBC 11.2, Hgb 11.2  Family Communication: Pt in room, family not at bedside  Disposition: Status is: Observation The patient will require care spanning > 2 midnights and should be moved to inpatient because: Severity of illness  Planned Discharge Destination:  Unclear at this time    Author: Marylu Lund, MD 08/06/2022 3:11 PM  For on call review [email or web address].

## 2022-08-06 NOTE — Plan of Care (Signed)
  Problem: Health Behavior/Discharge Planning: Goal: Ability to manage health-related needs will improve Outcome: Progressing   Problem: Clinical Measurements: Goal: Ability to maintain clinical measurements within normal limits will improve Outcome: Progressing   Problem: Clinical Measurements: Goal: Will remain free from infection Outcome: Progressing   

## 2022-08-06 NOTE — Evaluation (Signed)
Physical Therapy Evaluation Patient Details Name: Timothy Spencer MRN: [id number] DOB: 02-18-1941 Today's Date: 08/06/2022  History of Present Illness  Timothy Spencer is a 82 y.o. male  presents as a direct admit from home with delirium that started on the AM of 08/03/2022. 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 On the AM of 3/7, pt was noted to be more confused and lethargic. Ortho contacted Hospitalist for direct direct admit from home. Of note, pt has been with indwelling cath since 12/23, followed by Urology for failed voiding trials; found to have UTI; to OR 3/9 for L THA, Post Approach, 50%PWB; with medical history significant of copd, CAD, CKD3a, RBBB , HTN, hypothyroid, BPH with chronic indwelling cath  Clinical Impression   Pt admitted with above diagnosis. Lives at home with wife, in a two-level home (can stay on main level with bedroom/bathroom) with 1 steps to enter; Prior to admission, pt was able to walk short distances with RW, but having pain L hip leading up to this admission; Presents to PT with L hip pain postop, decr strength, L hip posterior motion restrictions;  Needs heavy mod assist to come to sit on EOB, 2 person mod assist to perform sit to stand transfers from elevated bed, and mod assist to take a few steps; Pt and wife very encouraged with pt's ability to get up and take steps today; they have been at SNF before, and would like to be considered for AIR stay to maximize independence and safety with mobility and ADLs prior to dc home; Wondering if urinary retention, and emphysema on top of this THA qualify him for medical complexity; Pt currently with functional limitations due to the deficits listed below (see PT Problem List). Pt will benefit from skilled PT to increase their independence and safety with mobility to allow discharge to the venue  listed below.          Recommendations for follow up therapy are one component of a multi-disciplinary discharge planning process, led by the attending physician.  Recommendations may be updated based on patient status, additional functional criteria and insurance authorization.  Follow Up Recommendations Acute inpatient rehab (3hours/day)      Assistance Recommended at Discharge Frequent or constant Supervision/Assistance  Patient can return home with the following  A lot of help with walking and/or transfers;A lot of help with bathing/dressing/bathroom;Assistance with cooking/housework;Assist for transportation;Help with stairs or ramp for entrance    Equipment Recommendations Rolling walker (2 wheels);BSC/3in1;Wheelchair (measurements PT);Wheelchair cushion (measurements PT) (I belive they are pretty well-equipped)  Recommendations for Other Services  OT consult (ordered per protocol)    Functional Status Assessment Patient has had a recent decline in their functional status and demonstrates the ability to make significant improvements in function in a reasonable and predictable amount of time.     Precautions / Restrictions Precautions Precautions: Posterior Hip;Fall Precaution Booklet Issued: Yes (comment) Precaution Comments: Anticipate wil need reinforcement of Posterior Hip Precautions Restrictions Weight Bearing Restrictions: Yes LLE Weight Bearing: Partial weight bearing LLE Partial Weight Bearing Percentage or Pounds: 50 Other Position/Activity Restrictions: Posterior Precautions      Mobility  Bed Mobility Overal bed mobility: Needs Assistance Bed Mobility: Supine to Sit     Supine to sit: +2 for physical assistance, Mod assist     General bed mobility comments: Following cues related to mobility well; Heavy  mod assist and use of bed pad to scoot to EOB on pt's R, elevate trunk to sit, and square off hip s at EOB    Transfers Overall transfer level: Needs  assistance Equipment used: Rolling walker (2 wheels) Transfers: Sit to/from Stand Sit to Stand: Mod assist, +2 safety/equipment, +2 physical assistance, From elevated surface           General transfer comment: Pt apprehensive about standing, and having 2 person assist seemed to ease his worries; Heavy mod assist to rise; cues for hand placement and safety; stood twice form elevated bed with cues to position LLE for comfort and for post prec    Ambulation/Gait Ambulation/Gait assistance: +2 safety/equipment, Mod assist Gait Distance (Feet): 5 Feet (with chair follow) Assistive device: Rolling walker (2 wheels) Gait Pattern/deviations: Step-to pattern       General Gait Details: Heavy step-by-step cues for sequence; short steps; limited distance due to fatigue adn noted DOE 3/4  Stairs            Wheelchair Mobility    Modified Rankin (Stroke Patients Only)       Balance Overall balance assessment: Needs assistance   Sitting balance-Leahy Scale: Fair       Standing balance-Leahy Scale: Poor                               Pertinent Vitals/Pain Pain Assessment Pain Assessment: Faces Faces Pain Scale: Hurts little more Pain Location: LLE with movemetn Pain Descriptors / Indicators: Grimacing, Guarding Pain Intervention(s): Monitored during session, Premedicated before session    Home Living Family/patient expects to be discharged to:: Private residence Living Arrangements: Spouse/significant other Available Help at Discharge: Family Type of Home:  (condominium) Home Access: Stairs to enter Entrance Stairs-Rails: Right;Left;Can reach both Entrance Stairs-Number of Steps: 1 Alternate Level Stairs-Number of Steps: 15 Home Layout: Two level;Bed/bath upstairs;Able to live on main level with bedroom/bathroom Home Equipment: Shower seat;Rolling Walker (2 wheels);Rollator (4 wheels)      Prior Function Prior Level of Function : Independent/Modified  Independent             Mobility Comments: Was independent until fall in Dec; underwent rehab at SNF; once home, dependent on RW ADLs Comments: Will need more info     Hand Dominance        Extremity/Trunk Assessment   Upper Extremity Assessment Upper Extremity Assessment: Generalized weakness    Lower Extremity Assessment Lower Extremity Assessment: Generalized weakness;LLE deficits/detail LLE Deficits / Details: Grossly decr AROM adn strength, limited by pain (and anticipation of pain) postop LLE: Unable to fully assess due to pain    Cervical / Trunk Assessment Cervical / Trunk Assessment: Kyphotic  Communication   Communication: No difficulties  Cognition Arousal/Alertness: Awake/alert Behavior During Therapy: WFL for tasks assessed/performed Overall Cognitive Status: Impaired/Different from baseline Area of Impairment: Memory                     Memory: Decreased short-term memory, Decreased recall of precautions         General Comments: Moments during PT eval that revealed memory deficits -- pt sis not remenber undergoing reahb at [place] comments (skin integrity, edema, etc.): Pt's wife present and helpful; Lenghty discussion re: considerations for leavign the hospital and post-acute rehab options    Exercises Total Joint Exercises Ankle Circles/Pumps: AROM, Both, 5 reps  Quad Sets: AROM, Both, Left, 5 reps Heel Slides: AAROM, Left, 10 reps Hip ABduction/ADduction: AAROM, Left, 5 reps   Assessment/Plan    PT Assessment Patient needs continued PT services  PT Problem List Decreased strength;Decreased range of motion;Decreased activity tolerance;Decreased balance;Decreased mobility;Decreased coordination;Decreased cognition;Decreased knowledge of use of DME;Decreased safety awareness;Decreased knowledge of precautions;Pain;Cardiopulmonary status limiting activity       PT Treatment Interventions DME  instruction;Gait training;Functional mobility training;Therapeutic activities;Therapeutic exercise;Stair training;Balance training;Cognitive remediation;Patient/family education;Wheelchair mobility training    PT Goals (Current goals can be found in the Care Plan section)  Acute Rehab PT Goals Patient Stated Goal: Hopes getting up won't hurt too badly PT Goal Formulation: With patient/family Time For Goal Achievement: 08/20/22 Potential to Achieve Goals: Good    Frequency Min 4X/week     Co-evaluation               AM-PAC PT "6 Clicks" Mobility  Outcome Measure Help needed turning from your back to your side while in a flat bed without using bedrails?: A Lot Help needed moving from lying on your back to sitting on the side of a flat bed without using bedrails?: A Lot Help needed moving to and from a bed to a chair (including a wheelchair)?: Total Help needed standing up from a chair using your arms (e.g., wheelchair or bedside chair)?: Total Help needed to walk in hospital room?: Total Help needed climbing 3-5 steps with a railing? : Total 6 Click Score: 8    End of Session Equipment Utilized During Treatment: Gait belt Activity Tolerance: Patient tolerated treatment well Patient left: in chair;with call bell/phone within reach;with chair alarm set;with family/visitor present Nurse Communication: Mobility status PT Visit Diagnosis: Unsteadiness on feet (R26.81);Other abnormalities of gait and mobility (R26.89);Muscle weakness (generalized) (M62.81);Pain Pain - Right/Left: Left Pain - part of body: Hip    Time: 1520-1550 PT Time Calculation (min) (ACUTE ONLY): 30 min   Charges:   PT Evaluation $PT Eval Moderate Complexity: 1 Mod PT Treatments $Therapeutic Activity: 8-22 mins        Roney Marion, PT  Acute Rehabilitation Services Office [phone]   Colletta [place] 08/06/2022, 6:46 PM

## 2022-08-07 ENCOUNTER — Encounter (HOSPITAL_COMMUNITY): Payer: Self-pay | Admitting: Orthopedic Surgery

## 2022-08-07 DIAGNOSIS — G928 Other toxic encephalopathy: Secondary | ICD-10-CM | POA: Diagnosis not present

## 2022-08-07 LAB — CBC
HCT: 31.2 % — ABNORMAL LOW (ref 39.0–52.0)
Hemoglobin: 10.5 g/dL — ABNORMAL LOW (ref 13.0–17.0)
MCH: 29.9 pg (ref 26.0–34.0)
MCHC: 33.7 g/dL (ref 30.0–36.0)
MCV: 88.9 fL (ref 80.0–100.0)
Platelets: 143 10*3/uL — ABNORMAL LOW (ref 150–400)
RBC: 3.51 MIL/uL — ABNORMAL LOW (ref 4.22–5.81)
RDW: 14.6 % (ref 11.5–15.5)
WBC: 8.5 10*3/uL (ref 4.0–10.5)
nRBC: 0 % (ref 0.0–0.2)

## 2022-08-07 LAB — COMPREHENSIVE METABOLIC PANEL
ALT: 9 U/L (ref 0–44)
AST: 17 U/L (ref 15–41)
Albumin: 2.5 g/dL — ABNORMAL LOW (ref 3.5–5.0)
Alkaline Phosphatase: 91 U/L (ref 38–126)
Anion gap: 9 (ref 5–15)
BUN: 31 mg/dL — ABNORMAL HIGH (ref 8–23)
CO2: 24 mmol/L (ref 22–32)
Calcium: 8.6 mg/dL — ABNORMAL LOW (ref 8.9–10.3)
Chloride: 102 mmol/L (ref 98–111)
Creatinine, Ser: 1.31 mg/dL — ABNORMAL HIGH (ref 0.61–1.24)
GFR, Estimated: 54 mL/min — ABNORMAL LOW (ref >60)
Glucose, Bld: 107 mg/dL — ABNORMAL HIGH (ref 70–99)
Potassium: 4.1 mmol/L (ref 3.5–5.1)
Sodium: 135 mmol/L (ref 135–145)
Total Bilirubin: 0.6 mg/dL (ref 0.3–1.2)
Total Protein: 5.3 g/dL — ABNORMAL LOW (ref 6.5–8.1)

## 2022-08-07 MED ORDER — MELATONIN 5 MG PO TABS
5.0000 mg | ORAL_TABLET | Freq: Every evening | ORAL | Status: DC | PRN
  Administered 2022-08-07: 5 mg via ORAL
  Filled 2022-08-07: qty 1

## 2022-08-07 NOTE — Plan of Care (Signed)
  Problem: Activity: Goal: Risk for activity intolerance will decrease Outcome: Progressing   Problem: Coping: Goal: Level of anxiety will decrease Outcome: Progressing   Problem: Pain Managment: Goal: General experience of comfort will improve Outcome: Progressing   

## 2022-08-07 NOTE — NC FL2 (Signed)
[place] LEVEL OF CARE FORM     IDENTIFICATION  Patient Name: Timothy Spencer Birthdate: [birth year] Sex: male Admission Date (Current Location): 08/03/2022  [facility] and [place] Number:  Herbalist and Address:  [facility] [place] [address] [place], [place] [address]      Provider Number: [id number]  Attending Physician Name and Address:  Donne Hazel, MD  Relative Name and Phone Number:  Kervens, Gregorio [phone]    Current Level of Care: Hospital Recommended Level of Care: [place] Prior Approval Number:    Date Approved/Denied:   PASRR Number: [id number]  Discharge Plan: SNF    Current Diagnoses: Patient Active Problem List   Diagnosis Date Noted   UTI (urinary tract infection) due to urinary indwelling catheter ([place]) 123456   Toxic metabolic encephalopathy Q000111Q   Protein-calorie malnutrition, severe 05/10/2022   Closed intertrochanteric fracture of left femur, initial encounter ([place]) 05/09/2022   Thrombocytopenia ([place]) 05/09/2022   Chronic kidney disease, stage 3a ([place]) 05/09/2022   Anxiety and depression    Alpha-1-antitrypsin 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 04/11/2017   Lung nodule < 6cm on CT 03/06/2017   COPD (chronic obstructive pulmonary disease) ([place]) 08/17/2015   Personal history of tobacco use, presenting hazards to health 06/28/2015   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   Essential hypertension 06/18/2015   CAD in native artery    Exertional dyspnea 11/21/2014   Right bundle branch block 07/17/2014   First degree AV block 07/17/2014   CAD (coronary artery disease) 07/17/2014   Fatigue 03/18/2013   Hypothyroid 03/18/2013   Hyperlipidemia with target LDL less than 70 03/18/2013    Orientation RESPIRATION BLADDER Height & Weight     Self, Time, Situation, Place  Normal Continent, Indwelling catheter Weight:   Height:     BEHAVIORAL SYMPTOMS/MOOD NEUROLOGICAL BOWEL NUTRITION  STATUS      Continent Diet (see discharge summary)  AMBULATORY STATUS COMMUNICATION OF NEEDS Skin   Limited Assist Verbally Surgical wounds                       Personal Care Assistance Level of Assistance  Bathing, Feeding, Dressing Bathing Assistance: Limited assistance Feeding assistance: Independent Dressing Assistance: Limited assistance     Functional Limitations Info  Sight, Hearing, Speech Sight Info: Adequate Hearing Info: Adequate Speech Info: Adequate    SPECIAL CARE FACTORS FREQUENCY  PT (By licensed PT), OT (By licensed OT)     PT Frequency: 5x week OT Frequency: 5x week            Contractures Contractures Info: Not present    Additional Factors Info  Code Status, Allergies Code Status Info: full Allergies Info: Hydrochlorothiazide W-spironolactone           Current Medications (08/07/2022):  This is the current hospital active medication list Current Facility-Administered Medications  Medication Dose Route Frequency Provider Last Rate Last Admin   acetaminophen (TYLENOL) tablet 650 mg  650 mg Oral Q6H PRN Dorise Bullion M, PA-C       Or   acetaminophen (TYLENOL) suppository 650 mg  650 mg Rectal Q6H PRN Dorise Bullion M, PA-C       albuterol (PROVENTIL) (2.5 MG/3ML) 0.083% nebulizer solution 2.5 mg  2.5 mg Nebulization Q2H PRN Dorise Bullion M, PA-C       buPROPion (WELLBUTRIN XL) 24 hr tablet 150 mg  150 mg Oral q morning Dorise Bullion M, PA-C   [id number] mg at 08/07/22  1003   cefTRIAXone (ROCEPHIN) 1 g in sodium chloride 0.9 % 100 mL IVPB  1 g Intravenous Q24H Donne Hazel, MD 200 mL/hr at 08/07/22 0900 1 g at 08/07/22 0900   enoxaparin (LOVENOX) injection 40 mg  40 mg Subcutaneous [id number] Dorise Bullion M, PA-C   40 mg at 08/07/22 0901   feeding supplement (ENSURE ENLIVE / ENSURE PLUS) liquid 237 mL  237 mL Oral BID BM Donne Hazel, MD   237 mL at 08/07/22 1004   hydrALAZINE (APRESOLINE) injection 5 mg  5 mg Intravenous Q4H PRN  Dorise Bullion M, PA-C       latanoprost (XALATAN) 0.005 % ophthalmic solution 1 drop  1 drop Both Eyes QHS Dorise Bullion M, PA-C   1 drop at 08/06/22 2031   levothyroxine (SYNTHROID) tablet 112 mcg  112 mcg Oral [id number] Prince Rome, PA-C   [id number] mcg at 08/07/22 0546   morphine (PF) 2 MG/ML injection 2 mg  2 mg Intravenous Q2H PRN Prince Rome, PA-C   2 mg at 08/05/22 1407   oxyCODONE (Oxy IR/ROXICODONE) immediate release tablet 5-10 mg  5-10 mg Oral Q4H PRN Prince Rome, PA-C   5 mg at 08/07/22 1003   polyethylene glycol (MIRALAX / GLYCOLAX) packet 17 g  17 g Oral Daily PRN Donne Hazel, MD       rOPINIRole (REQUIP) tablet 1 mg  1 mg Oral TID Dorise Bullion M, PA-C   1 mg at 08/07/22 1003   rosuvastatin (CRESTOR) tablet 5 mg  5 mg Oral Daily Dorise Bullion M, PA-C   5 mg at 08/07/22 1003   sertraline (ZOLOFT) tablet 100 mg  100 mg Oral Daily Dorise Bullion M, PA-C   [id number] mg at 08/07/22 1003   tamsulosin (FLOMAX) capsule 0.4 mg  0.4 mg Oral Daily Dorise Bullion M, PA-C   0.4 mg at 08/07/22 1003     Discharge Medications: Please see discharge summary for a list of discharge medications.  Relevant Imaging Results:  Relevant Lab Results:   Additional Information SSN: [id number], pt vaccinated for covid with booster  Joanne Chars, LCSW

## 2022-08-07 NOTE — Progress Notes (Addendum)
Inpatient Rehab Admissions Coordinator:   Consult received and chart reviewed.  [employer] will not approve CIR for pt's diagnosis.  Note comorbidities are stable and controlled on home regimen.  Recommend TOC follow up for alternative rehab venues.  We will sign off.   Shann Medal, PT, DPT Admissions Coordinator [phone] 08/07/22  9:32 AM

## 2022-08-07 NOTE — Progress Notes (Signed)
Physical Therapy Treatment Patient Details Name: Timothy Spencer MRN: [id number] DOB: July 11, 1940 Today's Date: 08/07/2022   History of Present Illness Timothy Spencer is a 82 y.o. male  presents as a direct admit from home with delirium that started on the AM of 08/03/2022. 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 On the AM of 3/7, pt was noted to be more confused and lethargic. Ortho contacted Hospitalist for direct direct admit from home. Of note, pt has been with indwelling cath since 12/23, followed by Urology for failed voiding trials; found to have UTI; to OR 3/9 for L THA, Post Approach, 50%PWB; with medical history significant of copd, CAD, CKD3a, RBBB , HTN, hypothyroid, BPH with chronic indwelling cath    PT Comments    Pt is progressing slowly towards goals. Pt was very upset today upon entering room stating that he was denied AIR and does not want to go back to [facility] SNF. Extra time was spent educating pt on the importance of continued skilled physical therapy services in the hospital and mobility to prevent debilitation. Emphasized pt WB precautions and the possibility of progressing to W/C level. Pt was agreeable to sit EOB during education with coaxing. Pt was able to perform bed mobility at very light Min A at the bil LE with occasional reminders to not cross the LLE over the RLE to maintain posterior hip precautions. Due to pt current functional status, home set up and available assistance at home recommending skilled physical therapy services at a higher level of care and frequency at this time on discharge from acute care hospital setting in order to decrease risk for immobility, skin break down, falls, injury and re-hospitalization.  Pt was short of breathe after bed mobility today.    Recommendations for follow up therapy are one component of a  multi-disciplinary discharge planning process, led by the attending physician.  Recommendations may be updated based on patient status, additional functional criteria and insurance authorization.  Follow Up Recommendations  Skilled nursing-short term rehab (<3 hours/day) Can patient physically be transported by private vehicle: No   Assistance Recommended at Discharge Frequent or constant Supervision/Assistance  Patient can return home with the following A lot of help with walking and/or transfers;Assistance with cooking/housework;Assist for transportation;Help with stairs or ramp for entrance   Equipment Recommendations  Rolling walker (2 wheels);BSC/3in1;Wheelchair (measurements PT);Wheelchair cushion (measurements PT)    Recommendations for Other Services       Precautions / Restrictions Precautions Precautions: Posterior Hip;Fall Precaution Comments: Needs reinforcement of Posterior Hip Precautions Restrictions Weight Bearing Restrictions: Yes LLE Weight Bearing: Partial weight bearing LLE Partial Weight Bearing Percentage or Pounds: 50 Other Position/Activity Restrictions: Posterior Precautions     Mobility  Bed Mobility Overal bed mobility: Needs Assistance Bed Mobility: Supine to Sit, Sit to Supine     Supine to sit: Min assist Sit to supine: Min assist   General bed mobility comments: Pt requires Min A for LE with extra time for supine<>sitting EOB. Patient Response: Anxious (upset)  Transfers       General transfer comment: Pt refused out of bed mobility. He scooted up EOB with use of bil UE for ~4 inches toward [facility] for positioning while in sitting with low clearance of buttocks from EOB.    Ambulation/Gait       General Gait Details: Unable to progress this session due  to pt refused out of bed mobility        Balance Overall balance assessment: Needs assistance Sitting-balance support: No upper extremity supported, Feet unsupported, Feet  supported Sitting balance-Leahy Scale: Good         Standing balance comment: unable to get to standing at this time       Cognition Arousal/Alertness: Awake/alert Behavior During Therapy: WFL for tasks assessed/performed Overall Cognitive Status: Impaired/Different from baseline Area of Impairment: Safety/judgement, Awareness     Memory: Decreased recall of precautions     General Comments: Pt is not aware of the importance of mobility and seems confused that he was denied from [place] comments (skin integrity, edema, etc.): Pt reporting pain in the L hip. Frequent reminders that he just had surgery.      Pertinent Vitals/Pain Pain Assessment Pain Assessment: Faces Faces Pain Scale: Hurts little more Pain Descriptors / Indicators: Grimacing, Guarding Pain Intervention(s): Monitored during session    Home Living Family/patient expects to be discharged to:: Private residence Living Arrangements: Spouse/significant other Available Help at Discharge: Family Type of Home: House Home Access: Stairs to enter Entrance Stairs-Rails: Right;Left;Can reach both Entrance Stairs-Number of Steps: 1   Home Layout: Two level;Bed/bath upstairs;Able to live on main level with bedroom/bathroom Home Equipment: Shower seat;Rolling Walker (2 wheels);Rollator (4 wheels)      Prior Function            PT Goals (current goals can now be found in the care plan section) Acute Rehab PT Goals Patient Stated Goal: Hopes getting up won't hurt too badly PT Goal Formulation: With patient/family Time For Goal Achievement: 08/20/22 Potential to Achieve Goals: Good Progress towards PT goals: Progressing toward goals    Frequency    Min 3X/week      PT Plan Discharge plan needs to be updated       AM-PAC PT "6 Clicks" Mobility   Outcome Measure  Help needed turning from your back to your side while in a flat bed without using bedrails?: A  Little Help needed moving from lying on your back to sitting on the side of a flat bed without using bedrails?: A Little Help needed moving to and from a bed to a chair (including a wheelchair)?: A Lot Help needed standing up from a chair using your arms (e.g., wheelchair or bedside chair)?: A Lot Help needed to walk in hospital room?: Total Help needed climbing 3-5 steps with a railing? : Total 6 Click Score: 12    End of Session   Activity Tolerance: Other (comment) (pt was limited due to mental status today.) Patient left: in bed;with call bell/phone within reach;with bed alarm set Nurse Communication: Mobility status PT Visit Diagnosis: Unsteadiness on feet (R26.81);Other abnormalities of gait and mobility (R26.89);Muscle weakness (generalized) (M62.81);Pain Pain - Right/Left: Left Pain - part of body: Hip     Time: [id number] PT Time Calculation (min) (ACUTE ONLY): 23 min  Charges:  $Therapeutic Activity: 23-37 mins                     Tomma Rakers, DPT, CLT  Acute Rehabilitation Services Office: [phone] (Secure chat preferred)    Ander Purpura 08/07/2022, 1:02 PM

## 2022-08-07 NOTE — TOC Initial Note (Addendum)
Transition of Care [facility]) - Initial/Assessment Note    Patient Details  Name: Timothy Spencer MRN: [id number] Date of Birth: 1940-06-05  Transition of Care [facility]) CM/SW Contact:    Joanne Chars, LCSW Phone Number: 08/07/2022, 12:10 PM  Clinical Narrative:  CSW spoke with pt wife regarding CIR not being option for DC plan.  Wife somewhat upset and would like to speak to CIR about this.  Wife is agreeable to plan for SNF.  Pt was at SNF recently, [facility], did not have good experience and does not want to return.  Would be interested in [facility]. Medicare choice document given to wife.  Pt lives at home with wife.  [facility] services in place currently.  Pt is vaccinated for covid with boosters.  Referral sent out in hub for SNF.    CSW reached out to [facility] to review.          1410: CSW spoke with wife, she is at [facility] for a tour, has not made decision yet.     Expected Discharge Plan: Skilled Nursing Facility Barriers to Discharge: Continued Medical Work up, SNF Pending bed offer   Patient Goals and CMS Choice   CMS Medicare.gov Compare Post Acute Care list provided to:: Patient Represenative (must comment) (wife [name]) Choice offered to / list presented to : Spouse      Expected Discharge Plan and Services In-house Referral: Clinical Social Work Discharge Planning Services: CM Consult Post Acute Care Choice: [place] Living arrangements for the past 2 months: Single Family Home                                      Prior Living Arrangements/Services Living arrangements for the past 2 months: Single Family Home Lives with:: Spouse Patient language and need for interpreter reviewed:: Yes        Need for Family Participation in Patient Care: Yes (Comment) Care giver support system in place?: Yes (comment) Current home services: Home OT, Home PT ([facility]) Criminal Activity/Legal Involvement Pertinent to Current  Situation/Hospitalization: No - Comment as needed  Activities of Daily Living Home Assistive Devices/Equipment: None ADL Screening (condition at time of admission) Patient's cognitive ability adequate to safely complete daily activities?: Yes Is the patient deaf or have difficulty hearing?: No Does the patient have difficulty seeing, even when wearing glasses/contacts?: No Does the patient have difficulty concentrating, remembering, or making decisions?: No Patient able to express need for assistance with ADLs?: Yes Does the patient have difficulty dressing or bathing?: No Independently performs ADLs?: No Communication: Needs assistance Is this a change from baseline?: Pre-admission baseline Dressing (OT): Needs assistance Is this a change from baseline?: Pre-admission baseline Grooming: Appropriate for developmental age Feeding: Appropriate for developmental age Bathing: Appropriate for developmental age 14: Appropriate for developmental age In/Out Bed: Appropriate for developmental age 21 in Home: Appropriate for developmental age Does the patient have difficulty walking or climbing stairs?: No Weakness of Legs: Both  Permission Sought/Granted                  Emotional Assessment Appearance:: Appears stated age Attitude/Demeanor/Rapport: Engaged Affect (typically observed): Pleasant Orientation: : Oriented to Self, Oriented to Place, Oriented to  Time, Oriented to Situation Alcohol / Substance Use: Not Applicable Psych Involvement: No (comment)  Admission diagnosis:  Toxic metabolic encephalopathy [id number] UTI (urinary tract infection) due to urinary indwelling catheter ([place]) [id number],  N39.0] Patient Active Problem List   Diagnosis Date Noted   UTI (urinary tract infection) due to urinary indwelling catheter ([place]) 123456   Toxic metabolic encephalopathy Q000111Q   Protein-calorie malnutrition, severe 05/10/2022   Closed intertrochanteric fracture of  left femur, initial encounter ([place]) 05/09/2022   Thrombocytopenia ([place]) 05/09/2022   Chronic kidney disease, stage 3a ([place]) 05/09/2022   Anxiety and depression    Alpha-1-antitrypsin 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 04/11/2017   Lung nodule < 6cm on CT 03/06/2017   COPD (chronic obstructive pulmonary disease) ([place]) 08/17/2015   Personal history of tobacco use, presenting hazards to health 06/28/2015   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   Essential hypertension 06/18/2015   CAD in native artery    Exertional dyspnea 11/21/2014   Right bundle branch block 07/17/2014   First degree AV block 07/17/2014   CAD (coronary artery disease) 07/17/2014   Fatigue 03/18/2013   Hypothyroid 03/18/2013   Hyperlipidemia with target LDL less than 70 03/18/2013   PCP:  Deland Pretty, MD Pharmacy:   [facility] [place], [place] [address] Phone: [phone] Fax: [phone]  [facility] - [place], [place] - [address] [place] [address] Phone: [phone] Fax: [phone]  [facility] - [place], [place] - [address] [place] [address] Phone: [phone] Fax: [phone]     Social Determinants of Health (SDOH) Social History: [place]: No Food Insecurity (08/03/2022)  Housing: Low Risk  (08/03/2022)  Transportation Needs: No Transportation Needs (08/03/2022)  Utilities: Not At Risk (08/03/2022)  Depression (PHQ2-9): Low Risk  (05/06/2019)  Tobacco Use: Medium Risk (08/05/2022)   SDOH Interventions: Food Insecurity Interventions: Patient Refused Housing Interventions: Patient Refused Utilities Interventions: Intervention Not Indicated   Readmission Risk Interventions     No data to display

## 2022-08-07 NOTE — Evaluation (Signed)
Occupational Therapy Evaluation Patient Details Name: Timothy Spencer MRN: [id number] DOB: 1940/10/27 Today's Date: 08/07/2022   History of Present Illness Timothy Spencer is a 82 y.o. male  presents as a direct admit from home with delirium that started on the AM of 08/03/2022. 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 On the AM of 3/7, pt was noted to be more confused and lethargic. Ortho contacted Hospitalist for direct direct admit from home. Of note, pt has been with indwelling cath since 12/23, followed by Urology for failed voiding trials; found to have UTI; to OR 3/9 for L THA, Post Approach, 50%PWB; with medical history significant of copd, CAD, CKD3a, RBBB , HTN, hypothyroid, BPH with chronic indwelling cath   Clinical Impression   Timothy Spencer presents to OT with post op L hip pain, decreased awareness of precautions, dynamic balance deficits and reduced independence in ADLs. He is very motivated to return to PLOF. He has already progressed nicely compared to yesterday's PT session and required mod A for bed mobility and min A for sit > stand and stand pivot. Recommend CIR at d/c and SNF if not covered by insurance. Pt would benefit from continued acute OT services to facilitate safe d/c home and optimize occupational performance.      Recommendations for follow up therapy are one component of a multi-disciplinary discharge planning process, led by the attending physician.  Recommendations may be updated based on patient status, additional functional criteria and insurance authorization.   Follow Up Recommendations  Acute inpatient rehab (3hours/day)     Assistance Recommended at Discharge Intermittent Supervision/Assistance  Patient can return home with the following A little help with walking and/or transfers;A little help with bathing/dressing/bathroom;Assistance  with cooking/housework;Direct supervision/assist for financial management;Direct supervision/assist for medications management    Functional Status Assessment  Patient has had a recent decline in their functional status and demonstrates the ability to make significant improvements in function in a reasonable and predictable amount of time.  Equipment Recommendations  None recommended by OT    Recommendations for Other Services Rehab consult     Precautions / Restrictions Precautions Precautions: Posterior Hip;Fall Precaution Comments: Anticipate wil need reinforcement of Posterior Hip Precautions Restrictions Weight Bearing Restrictions: No LLE Weight Bearing: Partial weight bearing LLE Partial Weight Bearing Percentage or Pounds: 50 Other Position/Activity Restrictions: Posterior Precautions      Mobility Bed Mobility Overal bed mobility: Needs Assistance Bed Mobility: Supine to Sit, Sit to Supine     Supine to sit: Min assist Sit to supine: Min assist        Transfers Overall transfer level: Needs assistance Equipment used: Rolling walker (2 wheels) Transfers: Sit to/from Stand, Bed to chair/wheelchair/BSC Sit to Stand: From elevated surface, Min assist Stand pivot transfers: Min assist                Balance Overall balance assessment: Needs assistance Sitting-balance support: No upper extremity supported Sitting balance-Leahy Scale: Fair     Standing balance support: Bilateral upper extremity supported Standing balance-Leahy Scale: Poor                             ADL either performed or assessed with clinical judgement   ADL Overall ADL's : Needs assistance/impaired Eating/Feeding: Supervision/ safety   Grooming: Wash/dry face;Wash/dry hands;Supervision/safety   Upper Body Bathing:  Supervision/ safety   Lower Body Bathing: Moderate assistance   Upper Body Dressing : Minimal assistance   Lower Body Dressing: Moderate assistance    Toilet Transfer: Moderate assistance   Toileting- Clothing Manipulation and Hygiene: Moderate assistance       Functional mobility during ADLs: Minimal assistance General ADL Comments: reliant on RW     Vision Baseline Vision/History: 3 Glaucoma;1 Wears glasses Patient Visual Report: No change from baseline Vision Assessment?: No apparent visual deficits     Perception     Praxis      Pertinent Vitals/Pain Pain Assessment Pain Assessment: 0-10 Pain Score: 3  Pain Location: LLE with movement Pain Descriptors / Indicators: Grimacing, Guarding Pain Intervention(s): Monitored during session     Hand Dominance Right   Extremity/Trunk Assessment Upper Extremity Assessment Upper Extremity Assessment: Generalized weakness   Lower Extremity Assessment Lower Extremity Assessment: LLE deficits/detail LLE Deficits / Details: Grossly decr AROM and strength, limited by pain (and anticipation of pain) postop LLE: Unable to fully assess due to pain   Cervical / Trunk Assessment Cervical / Trunk Assessment: Kyphotic   Communication Communication Communication: No difficulties   Cognition Arousal/Alertness: Awake/alert Behavior During Therapy: WFL for tasks assessed/performed Overall Cognitive Status: Impaired/Different from baseline Area of Impairment: Memory                     Memory: Decreased short-term memory, Decreased recall of precautions         General Comments: Moments during PT eval that revealed memory deficits -- pt sis not remenber undergoing reahb at [facility] Comments  Pt with great pain control, much less anxious than yesterday    Exercises     Shoulder Instructions      Home Living Family/patient expects to be discharged to:: Private residence Living Arrangements: Spouse/significant other Available Help at Discharge: Family Type of Home: House Home Access: Stairs to enter Technical brewer of Steps: 1 Entrance  Stairs-Rails: Right;Left;Can reach both Home Layout: Two level;Bed/bath upstairs;Able to live on main level with bedroom/bathroom     Bathroom Shower/Tub: Walk-in shower         Home Equipment: Advice worker (2 wheels);Rollator (4 wheels)          Prior Functioning/Environment Prior Level of Function : Independent/Modified Independent             Mobility Comments: Was independent until fall in Dec; underwent rehab at SNF; once home, dependent on RW ADLs Comments: Will need more info        OT Problem List: Decreased strength;Decreased coordination;Pain;Decreased range of motion;Decreased cognition;Decreased activity tolerance;Decreased safety awareness;Impaired balance (sitting and/or standing);Decreased knowledge of precautions;Decreased knowledge of use of DME or AE      OT Treatment/Interventions: Self-care/ADL training;Therapeutic exercise;Therapeutic activities;Energy conservation;Cognitive remediation/compensation;DME and/or AE instruction;Patient/family education;Balance training    OT Goals(Current goals can be found in the care plan section) Acute Rehab OT Goals Patient Stated Goal: "get stronger" OT Goal Formulation: With patient Time For Goal Achievement: 08/21/22 Potential to Achieve Goals: Good  OT Frequency: Min 2X/week    Co-evaluation              AM-PAC OT "6 Clicks" Daily Activity     Outcome Measure Help from another person eating meals?: None Help from another person taking care of personal grooming?: A Little Help from another person toileting, which includes using toliet, bedpan, or urinal?: A Lot Help from another person bathing (including washing, rinsing,  drying)?: A Little Help from another person to put on and taking off regular upper body clothing?: A Little Help from another person to put on and taking off regular lower body clothing?: A Lot 6 Click Score: 17   End of Session Equipment Utilized During Treatment: Gait  belt;Rolling walker (2 wheels) Nurse Communication: Mobility status  Activity Tolerance: Patient tolerated treatment well Patient left: with call bell/phone within reach;in chair;with chair alarm set  OT Visit Diagnosis: Muscle weakness (generalized) (M62.81);Unsteadiness on feet (R26.81)                Time: [id number] OT Time Calculation (min): 28 min Charges:  OT General Charges $OT Visit: 1 Visit OT Evaluation $OT Eval Moderate Complexity: 1 Mod OT Treatments $Self Care/Home Management : 8-22 mins  Laverle Hobby, OTR/L, CBIS Acute Rehab Office: [phone]  Curtis Sites 08/07/2022, 9:50 AM

## 2022-08-07 NOTE — Progress Notes (Signed)
  Progress Note   Patient: Timothy Spencer [id number] DOB: 1940/08/08 DOA: 08/03/2022     1 DOS: the patient was seen and examined on 08/07/2022   Brief hospital course: 82 y.o. male with medical history significant of copd, CAD, CKD3a, RBBB , HTN, hypothyroid, BPH with chronic indwelling cath presents as a direct admit from home with delirium that started on the AM of presentation. Pt had surgery for impated L intertrochanteric fracture on 12/23. Noted to have gradually worsening pain at site. 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 On the AM of presentation, pt was noted to be more confused and lethargic. Pt's wife left pt home alone w/ neighbor to tell orthopod in the office. Orthopedic Surgery called hospitalist for consideration for direct admit. Hospitalist agreed to direct admit from home. Of note, pt has been with indwelling cath since 12/23, followed by Urology for failed voiding trials. Family reports very cloudy urine leading up to visit   Assessment and Plan:  Delirium -Mentation seems much improved on arrival from home -UA is strongly suggestive of UTI, see below  UTI related to chronic foley cath -Pt has hx of BPH and chronic bladder outlet obstruction, requiring foley and followed by Urology as outpatient -UA is highly suggestive of UTI with leuks and nitrates -Urine culture is inconclusive  -Tolerating rocephin. Plan total 5 days abx to cover complicated UTI -Foley cath was changed out 3/8   Hx L femoral fracture -Followed by Orthopedic Surgery and is s/p surgery in 12/23 -pt underwent surgery to correct L intertrochanteric fracture nonunion 3/9 -Therapy recs initially for CIR, however, unable to cover that stay based on diagnosis, per CIR. SNF work up is pending   CAD -remains stable at present -BP meds were held secondary to soft bp at presentation -remains stable   CKD 3a -Last reported Cr of 1.46 from 12/23 -Cr stable  at 1.31 -recheck bmet in AM   COPD -no wheezing on exam -on minimal O2 support   HTN -BP remains stable at this time -remains stable off bp meds for now. BP is very well controlled   HLD -continue statin per home regimen    BPH -Followed by Urology  -Family reports pt failed multiple voiding trials -changed foley 3/8 given UTI -would have pt continuing to follow up with Urology as outpatient  Subjective: Without complaints  Physical Exam: Vitals:   08/06/22 2029 08/07/22 0544 08/07/22 0756 08/07/22 1434  BP: 127/63 (!) 130/58 (!) 133/59 (!) 127/54  Pulse: 85 95 96 94  Resp: 16 18 17 18   Temp: 98.7 F (37.1 C) 98.9 F (37.2 C) 98.1 F (36.7 C) 99.5 F (37.5 C)  TempSrc: Oral Oral Oral Oral  SpO2: 95% 95% 99% 98%   General exam: Conversant, in no acute distress Respiratory system: normal chest rise, clear, no audible wheezing Cardiovascular system: regular rhythm, s1-s2 Gastrointestinal system: Nondistended, nontender, pos BS Central nervous system: No seizures, no tremors Extremities: No cyanosis, no joint deformities Skin: No rashes, no pallor Psychiatry: Affect normal // no auditory hallucinations   Data Reviewed:  Labs reviewed: Na 135, K 4.1, Cr 1.31, WBC 8.5, Hgb 10.5  Family Communication: Pt in room, family not at bedside  Disposition: Status is: Inpatient Continue inpatient stay because: Severity of illness  Planned Discharge Destination: Skilled nursing facility    Author: Marylu Lund, MD 08/07/2022 2:42 PM  For on call review [email or web address].

## 2022-08-07 NOTE — Progress Notes (Signed)
     Subjective:  Patient reports pain as mild to moderate around operate site.  Slept well.  Denies numbness or tingling.  No other complaints.  Walked 5 feet with PT yesterday.  Eventually likely DC to rehab facility.  Family not at bedside.    Objective:   VITALS:   Vitals:   08/06/22 0834 08/06/22 1609 08/06/22 2029 08/07/22 0544  BP: 135/61 121/64 127/63 (!) 130/58  Pulse: 85 76 85 95  Resp: 18 18 16 18   Temp: 98.3 F (36.8 C) 98.3 F (36.8 C) 98.7 F (37.1 C) 98.9 F (37.2 C)  TempSrc:  Oral Oral Oral  SpO2: 93% 96% 95% 95%   Sitting up in bed, overall well-appearing Neurologically intact ABD soft Neurovascular intact Sensation intact distally Intact pulses distally Dorsiflexion/Plantar flexion intact Incision: dressing C/D/I No cellulitis present Compartment soft No leg shortening or malrotation  Lab Results  Component Value Date   WBC 11.2 (H) 08/06/2022   HGB 11.2 (L) 08/06/2022   HCT 33.2 (L) 08/06/2022   MCV 86.7 08/06/2022   PLT 152 08/06/2022   BMET    Component Value Date/Time   NA 135 08/07/2022 0316   K 4.1 08/07/2022 0316   CL 102 08/07/2022 0316   CO2 24 08/07/2022 0316   GLUCOSE 107 (H) 08/07/2022 0316   BUN 31 (H) 08/07/2022 0316   CREATININE 1.31 (H) 08/07/2022 0316   CREATININE 1.44 (H) 06/19/2016 1547   CALCIUM 8.6 (L) 08/07/2022 0316   GFRNONAA 54 (L) 08/07/2022 0316     Xray: PACU  imaging shows THA components in good position with no adverse features   Assessment/Plan: 2 Days Post-Op   Principal Problem:   Toxic metabolic encephalopathy Active Problems:   UTI (urinary tract infection) due to urinary indwelling catheter ([facility])   Post op recs: WB: 50% PWB LLE, posterior hip precautions x6 weeks Abx: ancef given preop, follow up intra-op cxs, currently on scheduled ceftriaxone for UTI  Imaging: PACU pelvis Xray Dressing: Aquacell, keep intact until follow up DVT prophylaxis: Lovenox 40 daily starting postop day 1   Follow up: 2 weeks after surgery for a wound check with Dr. Zachery Dakins at [facility].    Prince Rome 11/19/7626, 6:43 AM   Charlies Constable, MD  Contact information:   [phone] 7am-5pm epic message Dr. Zachery Dakins, or call office for patient follow up: [phone] After hours and holidays please check [email or web address] for group call information for [facility]

## 2022-08-08 DIAGNOSIS — G928 Other toxic encephalopathy: Secondary | ICD-10-CM | POA: Diagnosis not present

## 2022-08-08 LAB — CBC
HCT: 28.5 % — ABNORMAL LOW (ref 39.0–52.0)
Hemoglobin: 9.7 g/dL — ABNORMAL LOW (ref 13.0–17.0)
MCH: 29.7 pg (ref 26.0–34.0)
MCHC: 34 g/dL (ref 30.0–36.0)
MCV: 87.2 fL (ref 80.0–100.0)
Platelets: 143 10*3/uL — ABNORMAL LOW (ref 150–400)
RBC: 3.27 MIL/uL — ABNORMAL LOW (ref 4.22–5.81)
RDW: 14.6 % (ref 11.5–15.5)
WBC: 10.7 10*3/uL — ABNORMAL HIGH (ref 4.0–10.5)
nRBC: 0 % (ref 0.0–0.2)

## 2022-08-08 LAB — COMPREHENSIVE METABOLIC PANEL
ALT: 10 U/L (ref 0–44)
AST: 18 U/L (ref 15–41)
Albumin: 2.3 g/dL — ABNORMAL LOW (ref 3.5–5.0)
Alkaline Phosphatase: 83 U/L (ref 38–126)
Anion gap: 8 (ref 5–15)
BUN: 31 mg/dL — ABNORMAL HIGH (ref 8–23)
CO2: 23 mmol/L (ref 22–32)
Calcium: 8.2 mg/dL — ABNORMAL LOW (ref 8.9–10.3)
Chloride: 103 mmol/L (ref 98–111)
Creatinine, Ser: 1.25 mg/dL — ABNORMAL HIGH (ref 0.61–1.24)
GFR, Estimated: 57 mL/min — ABNORMAL LOW (ref >60)
Glucose, Bld: 117 mg/dL — ABNORMAL HIGH (ref 70–99)
Potassium: 3.8 mmol/L (ref 3.5–5.1)
Sodium: 134 mmol/L — ABNORMAL LOW (ref 135–145)
Total Bilirubin: 0.4 mg/dL (ref 0.3–1.2)
Total Protein: 5.3 g/dL — ABNORMAL LOW (ref 6.5–8.1)

## 2022-08-08 MED ORDER — OXYCODONE HCL 5 MG PO TABS: 5.0000 mg | ORAL_TABLET | ORAL | 0 refills | Status: AC | PRN

## 2022-08-08 MED ORDER — ENOXAPARIN SODIUM 40 MG/0.4ML IJ SOSY: 40.0000 mg | PREFILLED_SYRINGE | INTRAMUSCULAR | 0 refills | Status: DC

## 2022-08-08 MED ORDER — CHLORHEXIDINE GLUCONATE CLOTH 2 % EX PADS
6.0000 | MEDICATED_PAD | Freq: Every day | CUTANEOUS | Status: DC
  Administered 2022-08-08 – 2022-08-09 (×2): 6 via TOPICAL

## 2022-08-08 NOTE — TOC Progression Note (Addendum)
Transition of Care [facility]) - Progression Note    Patient Details  Name: Timothy Spencer MRN: [id number] Date of Birth: 1940/11/18  [facility] CM/SW Contact  Joanne Chars, LCSW Phone Number: 08/08/2022, 8:55 AM  Clinical Narrative: CSW spoke with pt wife, she would like to accept offer at [facility]  CSW confirmed with [facility]--she cannot accept pt until tomorrow.     1000: unable to pull pt up in portal.  Called [employer], auth started over phone, clinicals faxed.   1215: TC [employer]: auth approved, CSW unable to right down reference number, approved 3 days: 3/13-3/15.     Expected Discharge Plan: [place] Barriers to Discharge: Continued Medical Work up, SNF Pending bed offer  Expected Discharge Plan and Services In-house Referral: Clinical Social Work Discharge Planning Services: CM Consult Post Acute Care Choice: [place] Living arrangements for the past 2 months: Single Family Home                                       Social Determinants of Health (SDOH) Interventions SDOH Screenings   Food Insecurity: No Food Insecurity (08/03/2022)  Housing: Low Risk  (08/03/2022)  Transportation Needs: No Transportation Needs (08/03/2022)  Utilities: Not At Risk (08/03/2022)  Depression (PHQ2-9): Low Risk  (05/06/2019)  Tobacco Use: Medium Risk (08/07/2022)    Readmission Risk Interventions     No data to display

## 2022-08-08 NOTE — Progress Notes (Signed)
Progress Note   Patient: Timothy Spencer [id number] DOB: 05-17-1941 DOA: 08/03/2022     2 DOS: the patient was seen and examined on 08/08/2022   Brief hospital course: 82 y.o. male with medical history significant of copd, CAD, CKD3a, RBBB , HTN, hypothyroid, BPH with chronic indwelling cath presents as a direct admit from home with delirium that started on the AM of presentation. Pt had surgery for impated L intertrochanteric fracture on 12/23. Noted to have gradually worsening pain at site. 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 On the AM of presentation, pt was noted to be more confused and lethargic. Pt's wife left pt home alone w/ neighbor to tell orthopod in the office. Orthopedic Surgery called hospitalist for consideration for direct admit. Hospitalist agreed to direct admit from home. Of note, pt has been with indwelling cath since 12/23, followed by Urology for failed voiding trials. Family reports very cloudy urine leading up to visit   Assessment and Plan:  Delirium -Mentation seems much improved since arrival to [facility] -UA is strongly suggestive of UTI, see below  UTI related to chronic foley cath -Pt has hx of BPH and chronic bladder outlet obstruction, requiring foley and followed by Urology as outpatient -UA is highly suggestive of UTI with leuks and nitrates -Urine culture is inconclusive  -Tolerating rocephin. 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 -TOC following for SNF placement   CAD -BP meds were held secondary to soft bp at presentation -remains stable   CKD 3a -Last reported Cr of 1.46 from 12/23 -Cr stable at  1.25 -recheck bmet in AM   COPD -no wheezing on exam -on minimal O2 support   HTN -BP meds were held at admit secondary to soft BP -SBP improving to the 120-130's. Will restart low dose norvasc '5mg'$  -Cont to hold ARB for now, may resume once BP and renal function allows   HLD -continue statin per home regimen    BPH -Followed by Urology  -Family reports pt failed multiple voiding trials -changed foley 3/8 given UTI -would have pt continuing to follow up with Urology as outpatient  Subjective: No complaints this AM. Eager to go to rehab  Physical Exam: Vitals:   08/07/22 1938 08/08/22 0421 08/08/22 0808 08/08/22 1447  BP: 132/65 (!) 129/58 122/70 (!) 134/59  Pulse: (!) 110 79 72 87  Resp: '20 16 17 19  '$ Temp: 99.3 F (37.4 C) 98.7 F (37.1 C) 98.3 F (36.8 C) 98.1 F (36.7 C)  TempSrc: Oral Oral Oral Oral  SpO2: 95% 96% 96% 98%   General exam: Awake, laying in bed, in nad Respiratory system: Normal respiratory effort, no wheezing Cardiovascular system: regular rate, s1, s2 Gastrointestinal system: Soft, nondistended, positive BS Central nervous system: CN2-12 grossly intact, strength intact Extremities: Perfused, no clubbing Skin: Normal skin turgor, no notable skin lesions seen Psychiatry: Mood normal // no visual hallucinations   Data Reviewed:  Labs reviewed: Na 134, K 3.8, Cr 1.25, WBC 10.7, Hgb 9.7  Family Communication: Pt in room, family not at bedside  Disposition: Status is: Inpatient Continue inpatient stay because: Severity of illness  Planned Discharge Destination: Skilled nursing  facility    Author: Marylu Lund, MD 08/08/2022 3:16 PM  For on call review [email or web address].

## 2022-08-08 NOTE — Plan of Care (Signed)

## 2022-08-08 NOTE — Progress Notes (Signed)
   ORTHOPAEDIC PROGRESS NOTE  s/p Procedure(s): TOTAL HIP ARTHROPLASTY  SUBJECTIVE: Reports pain well controlled. Not been out of bed today but was up to chair yesterday. Plan for DC to [facility].  No chest pain. No SOB. No nausea/vomiting. No other complaints.  OBJECTIVE: PE: General: sitting up in hospital bed, NAD LLE: dressing CDI, leg lengths equal, intact EHL/TA/GSC, endorses distal sensation, warm well perfused foot   Vitals:   08/08/22 0421 08/08/22 0808  BP: (!) 129/58 122/70  Pulse: 79 72  Resp: 16 17  Temp: 98.7 F (37.1 C) 98.3 F (36.8 C)  SpO2: 96% 96%   Stable post-op images.   ASSESSMENT: Timothy Spencer is a 82 y.o. male POD#1  PLAN: Weightbearing: 50% PWB LLE, posterior hip precautions x6 weeks  Insicional and dressing care: Reinforce dressings as needed Orthopedic device(s): None Showering: Hold for now VTE prophylaxis:  Lovenox 40 daily   Pain control: PRN pain medications, minimize narcotics as able  Abx: ancef given preop, follow up intra-op cxs: NGTD, currently on scheduled ceftriaxone for UTI  Imaging: PACU pelvis Xray Follow - up plan: 2 weeks after surgery for a wound check with Dr. Zachery Dakins at [place]: TBD. PT/OT evals today. Likely SNF.   Contact information:  After hours and holidays please check [email or web address] for group call information for [facility]  Noemi Chapel, PA-C 08/06/2022

## 2022-08-09 ENCOUNTER — Telehealth: Payer: Self-pay

## 2022-08-09 DIAGNOSIS — E039 Hypothyroidism, unspecified: Secondary | ICD-10-CM | POA: Diagnosis not present

## 2022-08-09 DIAGNOSIS — R2689 Other abnormalities of gait and mobility: Secondary | ICD-10-CM | POA: Diagnosis not present

## 2022-08-09 DIAGNOSIS — R278 Other lack of coordination: Secondary | ICD-10-CM | POA: Diagnosis not present

## 2022-08-09 DIAGNOSIS — H35371 Puckering of macula, right eye: Secondary | ICD-10-CM | POA: Diagnosis not present

## 2022-08-09 DIAGNOSIS — E785 Hyperlipidemia, unspecified: Secondary | ICD-10-CM | POA: Diagnosis not present

## 2022-08-09 DIAGNOSIS — R531 Weakness: Secondary | ICD-10-CM | POA: Diagnosis not present

## 2022-08-09 DIAGNOSIS — D696 Thrombocytopenia, unspecified: Secondary | ICD-10-CM | POA: Diagnosis not present

## 2022-08-09 DIAGNOSIS — H353221 Exudative age-related macular degeneration, left eye, with active choroidal neovascularization: Secondary | ICD-10-CM | POA: Diagnosis not present

## 2022-08-09 DIAGNOSIS — G9341 Metabolic encephalopathy: Secondary | ICD-10-CM | POA: Diagnosis not present

## 2022-08-09 DIAGNOSIS — T83511S Infection and inflammatory reaction due to indwelling urethral catheter, sequela: Secondary | ICD-10-CM

## 2022-08-09 DIAGNOSIS — Z148 Genetic carrier of other disease: Secondary | ICD-10-CM | POA: Diagnosis not present

## 2022-08-09 DIAGNOSIS — F419 Anxiety disorder, unspecified: Secondary | ICD-10-CM | POA: Diagnosis not present

## 2022-08-09 DIAGNOSIS — H43813 Vitreous degeneration, bilateral: Secondary | ICD-10-CM | POA: Diagnosis not present

## 2022-08-09 DIAGNOSIS — E43 Unspecified severe protein-calorie malnutrition: Secondary | ICD-10-CM | POA: Diagnosis not present

## 2022-08-09 DIAGNOSIS — R339 Retention of urine, unspecified: Secondary | ICD-10-CM | POA: Diagnosis not present

## 2022-08-09 DIAGNOSIS — M6281 Muscle weakness (generalized): Secondary | ICD-10-CM | POA: Diagnosis not present

## 2022-08-09 DIAGNOSIS — S72142D Displaced intertrochanteric fracture of left femur, subsequent encounter for closed fracture with routine healing: Secondary | ICD-10-CM | POA: Diagnosis not present

## 2022-08-09 DIAGNOSIS — J449 Chronic obstructive pulmonary disease, unspecified: Secondary | ICD-10-CM | POA: Diagnosis not present

## 2022-08-09 DIAGNOSIS — H43822 Vitreomacular adhesion, left eye: Secondary | ICD-10-CM | POA: Diagnosis not present

## 2022-08-09 DIAGNOSIS — N390 Urinary tract infection, site not specified: Secondary | ICD-10-CM | POA: Diagnosis not present

## 2022-08-09 DIAGNOSIS — H409 Unspecified glaucoma: Secondary | ICD-10-CM | POA: Diagnosis not present

## 2022-08-09 DIAGNOSIS — H353114 Nonexudative age-related macular degeneration, right eye, advanced atrophic with subfoveal involvement: Secondary | ICD-10-CM | POA: Diagnosis not present

## 2022-08-09 DIAGNOSIS — F339 Major depressive disorder, recurrent, unspecified: Secondary | ICD-10-CM | POA: Diagnosis not present

## 2022-08-09 DIAGNOSIS — T84195D Other mechanical complication of internal fixation device of left femur, subsequent encounter: Secondary | ICD-10-CM | POA: Diagnosis not present

## 2022-08-09 DIAGNOSIS — N1831 Chronic kidney disease, stage 3a: Secondary | ICD-10-CM | POA: Diagnosis not present

## 2022-08-09 DIAGNOSIS — I10 Essential (primary) hypertension: Secondary | ICD-10-CM | POA: Diagnosis not present

## 2022-08-09 DIAGNOSIS — Z87438 Personal history of other diseases of male genital organs: Secondary | ICD-10-CM | POA: Diagnosis not present

## 2022-08-09 DIAGNOSIS — J441 Chronic obstructive pulmonary disease with (acute) exacerbation: Secondary | ICD-10-CM | POA: Diagnosis not present

## 2022-08-09 DIAGNOSIS — Z743 Need for continuous supervision: Secondary | ICD-10-CM | POA: Diagnosis not present

## 2022-08-09 DIAGNOSIS — G928 Other toxic encephalopathy: Secondary | ICD-10-CM | POA: Diagnosis not present

## 2022-08-09 DIAGNOSIS — R2681 Unsteadiness on feet: Secondary | ICD-10-CM | POA: Diagnosis not present

## 2022-08-09 LAB — COMPREHENSIVE METABOLIC PANEL
ALT: 30 U/L (ref 0–44)
AST: 55 U/L — ABNORMAL HIGH (ref 15–41)
Albumin: 2.1 g/dL — ABNORMAL LOW (ref 3.5–5.0)
Alkaline Phosphatase: 87 U/L (ref 38–126)
Anion gap: 8 (ref 5–15)
BUN: 31 mg/dL — ABNORMAL HIGH (ref 8–23)
CO2: 23 mmol/L (ref 22–32)
Calcium: 8.4 mg/dL — ABNORMAL LOW (ref 8.9–10.3)
Chloride: 104 mmol/L (ref 98–111)
Creatinine, Ser: 1.09 mg/dL (ref 0.61–1.24)
GFR, Estimated: >60 mL/min (ref >60)
Glucose, Bld: 121 mg/dL — ABNORMAL HIGH (ref 70–99)
Potassium: 3.5 mmol/L (ref 3.5–5.1)
Sodium: 135 mmol/L (ref 135–145)
Total Bilirubin: 0.1 mg/dL — ABNORMAL LOW (ref 0.3–1.2)
Total Protein: 5.1 g/dL — ABNORMAL LOW (ref 6.5–8.1)

## 2022-08-09 LAB — CBC
HCT: 27.6 % — ABNORMAL LOW (ref 39.0–52.0)
Hemoglobin: 9.3 g/dL — ABNORMAL LOW (ref 13.0–17.0)
MCH: 29.7 pg (ref 26.0–34.0)
MCHC: 33.7 g/dL (ref 30.0–36.0)
MCV: 88.2 fL (ref 80.0–100.0)
Platelets: 168 10*3/uL (ref 150–400)
RBC: 3.13 MIL/uL — ABNORMAL LOW (ref 4.22–5.81)
RDW: 14.6 % (ref 11.5–15.5)
WBC: 9 10*3/uL (ref 4.0–10.5)
nRBC: 0 % (ref 0.0–0.2)

## 2022-08-09 MED ORDER — AMLODIPINE BESYLATE 5 MG PO TABS: 5.0000 mg | ORAL_TABLET | Freq: Every day | ORAL | Status: DC

## 2022-08-09 MED ORDER — OXYCODONE HCL 5 MG PO TABS
10.0000 mg | ORAL_TABLET | Freq: Once | ORAL | Status: AC
  Administered 2022-08-09: 10 mg via ORAL
  Filled 2022-08-09: qty 2

## 2022-08-09 NOTE — Care Management Important Message (Signed)
Important Message  Patient Details  Name: Timothy Spencer MRN: [id number] Date of Birth: 03-02-1941   Medicare Important Message Given:  Yes     Hannah Beat 08/09/2022, 4:06 PM

## 2022-08-09 NOTE — Progress Notes (Signed)
RN called to give report, no answer, left a voicemail and a callback number.

## 2022-08-09 NOTE — Progress Notes (Signed)
Occupational Therapy Treatment Patient Details Name: Timothy Spencer MRN: [id number] DOB: 1940-08-30 Today's Date: 08/09/2022   History of present illness Timothy Spencer is a 82 y.o. male  presents as a direct admit from home with delirium that started on the AM of 08/03/2022. 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 On the AM of 3/7, pt was noted to be more confused and lethargic. Ortho contacted Hospitalist for direct direct admit from home. Of note, pt has been with indwelling cath since 12/23, followed by Urology for failed voiding trials; found to have UTI; to OR 3/9 for L THA, Post Approach, 50%PWB; with medical history significant of copd, CAD, CKD3a, RBBB , HTN, hypothyroid, BPH with chronic indwelling cath   OT comments  Pt progressing toward established OT goals. Min encouragement to participate due to pt unsure if he needed to have a BM. Offered to get to [facility] and pt agreeable. Pt able to recall weightbearing status, but unable to recall precautions, and required min cues for precautions throughout. Additional cues to weightbear through BUE during steps with RLE to decrease weightbearing. Mod A for transfers and total A for pericare due to reliance on BUE in standing. Continue to recommend SNF for continued OT services.    Recommendations for follow up therapy are one component of a multi-disciplinary discharge planning process, led by the attending physician.  Recommendations may be updated based on patient status, additional functional criteria and insurance authorization.    Follow Up Recommendations  Acute inpatient rehab (3hours/day)     Assistance Recommended at Discharge Intermittent Supervision/Assistance  Patient can return home with the following  A little help with walking and/or transfers;A little help with bathing/dressing/bathroom;Assistance with  cooking/housework;Direct supervision/assist for financial management;Direct supervision/assist for medications management   Equipment Recommendations  None recommended by OT    Recommendations for Other Services Rehab consult    Precautions / Restrictions Precautions Precautions: Posterior Hip;Fall Precaution Booklet Issued: Yes (comment) Precaution Comments: Needs reinforcement of Posterior Hip Precautions Restrictions Weight Bearing Restrictions: Yes LLE Weight Bearing: Partial weight bearing LLE Partial Weight Bearing Percentage or Pounds: 50 Other Position/Activity Restrictions: Posterior Precautions       Mobility Bed Mobility Overal bed mobility: Needs Assistance             General bed mobility comments: up in recliner on arrival and departure.m    Transfers Overall transfer level: Needs assistance Equipment used: Rolling walker (2 wheels) Transfers: Sit to/from Stand, Bed to chair/wheelchair/BSC Sit to Stand: From elevated surface, Min assist     Step pivot transfers: Mod assist     General transfer comment: min A to power up to stand from elevate EOB, mod a to steady to step pivot to chair on R     Balance Overall balance assessment: Needs assistance Sitting-balance support: No upper extremity supported, Feet unsupported, Feet supported Sitting balance-Leahy Scale: Good     Standing balance support: Bilateral upper extremity supported Standing balance-Leahy Scale: Poor Standing balance comment: heavy reliance on UE support                           ADL either performed or assessed with clinical judgement   ADL Overall ADL's : Needs assistance/impaired  Toilet Transfer: Moderate assistance;Rolling walker (2 wheels);Stand-pivot;BSC/3in1 Armed forces technical officer Details (indicate cue type and reason): Mod A up from lower surfaces Toileting- Clothing Manipulation and Hygiene: Total assistance;Sit to/from  stand Toileting - Clothing Manipulation Details (indicate cue type and reason): PT required BUE to maintain standing balance.     Functional mobility during ADLs: Minimal assistance;Rolling walker (2 wheels) General ADL Comments: reliant on RW    Extremity/Trunk Assessment Upper Extremity Assessment Upper Extremity Assessment: Generalized weakness   Lower Extremity Assessment Lower Extremity Assessment: Defer to PT evaluation        Vision   Vision Assessment?: No apparent visual deficits   Perception     Praxis      Cognition Arousal/Alertness: Awake/alert Behavior During Therapy: WFL for tasks assessed/performed Overall Cognitive Status: Impaired/Different from baseline Area of Impairment: Safety/judgement, Awareness                     Memory: Decreased recall of precautions, Decreased short-term memory   Safety/Judgement: Decreased awareness of safety, Decreased awareness of deficits Awareness: Intellectual, Emergent   General Comments: Able to recall weightbearing but not hip precauitions        Exercises      Shoulder Instructions       General Comments frequent reminders for hip precautions.    Pertinent Vitals/ Pain       Pain Assessment Pain Assessment: Faces Faces Pain Scale: Hurts little more Pain Location: LLE with movement Pain Descriptors / Indicators: Grimacing, Guarding Pain Intervention(s): Limited activity within patient's tolerance, Monitored during session, Repositioned, Premedicated before session  Home Living                                          Prior Functioning/Environment              Frequency  Min 2X/week        Progress Toward Goals  OT Goals(current goals can now be found in the care plan section)  Progress towards OT goals: Progressing toward goals  Acute Rehab OT Goals Patient Stated Goal: get stronger OT Goal Formulation: With patient Time For Goal Achievement:  08/21/22 Potential to Achieve Goals: Good ADL Goals Pt Will Perform Lower Body Bathing: with supervision Pt Will Perform Lower Body Dressing: with supervision Pt Will Transfer to Toilet: with supervision Pt Will Perform Toileting - Clothing Manipulation and hygiene: with supervision  Plan Discharge plan remains appropriate;Frequency remains appropriate    Co-evaluation                 AM-PAC OT "6 Clicks" Daily Activity     Outcome Measure   Help from another person eating meals?: None Help from another person taking care of personal grooming?: A Little Help from another person toileting, which includes using toliet, bedpan, or urinal?: A Lot Help from another person bathing (including washing, rinsing, drying)?: A Little Help from another person to put on and taking off regular upper body clothing?: A Little Help from another person to put on and taking off regular lower body clothing?: A Lot 6 Click Score: 17    End of Session Equipment Utilized During Treatment: Gait belt;Rolling walker (2 wheels)  OT Visit Diagnosis: Muscle weakness (generalized) (M62.81);Unsteadiness on feet (R26.81)   Activity Tolerance Patient tolerated treatment well   Patient Left with call bell/phone within reach;in chair;with chair alarm set   Nurse Communication  Mobility status        Time: [id number] OT Time Calculation (min): 24 min  Charges: OT General Charges $OT Visit: 1 Visit OT Treatments $Self Care/Home Management : 23-37 mins  Elder Cyphers, OTR/L [facility] Acute Rehabilitation Office: [phone]   Magnus Ivan 08/09/2022, 3:01 PM

## 2022-08-09 NOTE — Plan of Care (Signed)

## 2022-08-09 NOTE — Progress Notes (Signed)
Physical Therapy Treatment Patient Details Name: Timothy Spencer MRN: [id number] DOB: 09/12/1940 Today's Date: 08/09/2022   History of Present Illness Timothy Spencer is a 82 y.o. male  presents as a direct admit from home with delirium that started on the AM of 08/03/2022. 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 On the AM of 3/7, pt was noted to be more confused and lethargic. Ortho contacted Hospitalist for direct direct admit from home. Of note, pt has been with indwelling cath since 12/23, followed by Urology for failed voiding trials; found to have UTI; to OR 3/9 for L THA, Post Approach, 50%PWB; with medical history significant of copd, CAD, CKD3a, RBBB , HTN, hypothyroid, BPH with chronic indwelling cath    PT Comments    Pt greeted supine in bed and agreeable to session with good progress towards acute goals. Session focused on bed mobility and functional transfers for increased activity tolerance and independence. Pt able to come to sitting EOB with min A to scoot out to EOB with pt utilizing gait belt as leg lifter with good return. Pt able to power up to standing with min A and take steps to recliner with mod A and RW support. Pt requiring increased rest/recovery between mobility tasks secondary to SOB/emphysema. Pt needing max cues to maintain WB precautions as pt with no recall for WB restrictions or hip precautions. Reviewed all with pt throughout session. Current plan remains appropriate to address deficits and maximize functional independence and decrease caregiver burden. .cont   Recommendations for follow up therapy are one component of a multi-disciplinary discharge planning process, led by the attending physician.  Recommendations may be updated based on patient status, additional functional criteria and insurance authorization.  Follow Up  Recommendations  Skilled nursing-short term rehab (<3 hours/day) Can patient physically be transported by private vehicle: No   Assistance Recommended at Discharge Frequent or constant Supervision/Assistance  Patient can return home with the following A lot of help with walking and/or transfers;Assistance with cooking/housework;Assist for transportation;Help with stairs or ramp for entrance   Equipment Recommendations  Rolling walker (2 wheels);BSC/3in1;Wheelchair (measurements PT);Wheelchair cushion (measurements PT)    Recommendations for Other Services       Precautions / Restrictions Precautions Precautions: Posterior Hip;Fall Precaution Booklet Issued: Yes (comment) Precaution Comments: Needs reinforcement of Posterior Hip Precautions Restrictions Weight Bearing Restrictions: Yes LLE Weight Bearing: Partial weight bearing LLE Partial Weight Bearing Percentage or Pounds: 50 Other Position/Activity Restrictions: Posterior Precautions     Mobility  Bed Mobility Overal bed mobility: Needs Assistance Bed Mobility: Supine to Sit, Sit to Supine     Supine to sit: Min assist     General bed mobility comments: pt able to self mobilize LLE to EOB with gait belt used as leg lifter, increased time needed, assist to scoot fully out to EOB    Transfers Overall transfer level: Needs assistance Equipment used: Rolling walker (2 wheels) Transfers: Sit to/from Stand, Bed to chair/wheelchair/BSC Sit to Stand: From elevated surface, Min assist   Step pivot transfers: Mod assist       General transfer comment: min A to power up to stand from elevate EOB, mod a to steady to step pivot to chair on R    Ambulation/Gait                   Stairs  Wheelchair Mobility    Modified Rankin (Stroke Patients Only)       Balance Overall balance assessment: Needs assistance Sitting-balance support: No upper extremity supported, Feet unsupported, Feet  supported Sitting balance-Leahy Scale: Good     Standing balance support: Bilateral upper extremity supported Standing balance-Leahy Scale: Poor Standing balance comment: heavy reliance on UE support                            Cognition Arousal/Alertness: Awake/alert Behavior During Therapy: WFL for tasks assessed/performed Overall Cognitive Status: Impaired/Different from baseline Area of Impairment: Safety/judgement, Awareness                     Memory: Decreased recall of precautions, Decreased short-term memory         General Comments: pt unable to recall any WB or hip precatuions, also unable to recall previsou therapy sessions        Exercises      General Comments General comments (skin integrity, edema, etc.): Pt needing freqent reminders for hip precautions and WB restricitons throughout session      Pertinent Vitals/Pain Pain Assessment Pain Assessment: Faces Faces Pain Scale: Hurts little more Pain Location: LLE with movement Pain Descriptors / Indicators: Grimacing, Guarding Pain Intervention(s): Monitored during session, Limited activity within patient's tolerance, Repositioned    Home Living                          Prior Function            PT Goals (current goals can now be found in the care plan section) Acute Rehab PT Goals PT Goal Formulation: With patient/family Time For Goal Achievement: 08/20/22 Progress towards PT goals: Progressing toward goals    Frequency    Min 3X/week      PT Plan      Co-evaluation              AM-PAC PT "6 Clicks" Mobility   Outcome Measure  Help needed turning from your back to your side while in a flat bed without using bedrails?: A Little Help needed moving from lying on your back to sitting on the side of a flat bed without using bedrails?: A Little Help needed moving to and from a bed to a chair (including a wheelchair)?: A Lot Help needed standing up from a  chair using your arms (e.g., wheelchair or bedside chair)?: A Little Help needed to walk in hospital room?: A Lot Help needed climbing 3-5 steps with a railing? : Total 6 Click Score: 14    End of Session Equipment Utilized During Treatment: Gait belt Activity Tolerance: Patient tolerated treatment well Patient left: with call bell/phone within reach;in chair;with chair alarm set Nurse Communication: Mobility status PT Visit Diagnosis: Unsteadiness on feet (R26.81);Other abnormalities of gait and mobility (R26.89);Muscle weakness (generalized) (M62.81);Pain Pain - Right/Left: Left Pain - part of body: Hip     Time: [id number] PT Time Calculation (min) (ACUTE ONLY): 23 min  Charges:  $Therapeutic Activity: 23-37 mins                     Aloysuis Ribaudo R. PTA Acute Rehabilitation Services Office: [place] 08/09/2022, 10:47 AM

## 2022-08-09 NOTE — Discharge Summary (Signed)
Physician Discharge Summary   Patient: Timothy Spencer MRN: [id number] DOB: 19-Jun-1940  Admit date:     08/03/2022  Discharge date: 08/09/22  Discharge Physician: Oswald Hillock   PCP: Deland Pretty, MD   Recommendations at discharge:   Follow up in 2  weeks after surgery for a wound check with Dr. Zachery Dakins at [place]  DVT prophylaxis with Lovenox for 26 days as per orthopedics recommendation  Discharge Diagnoses: Principal Problem:   Toxic metabolic encephalopathy Active Problems:   UTI (urinary tract infection) due to urinary indwelling catheter ([place])  Resolved Problems:   * No resolved hospital problems. *  Hospital Course:  82 y.o. male with medical history significant of copd, CAD, CKD3a, RBBB , HTN, hypothyroid, BPH with chronic indwelling cath presents as a direct admit from home with delirium that started on the AM of presentation. Pt had surgery for impated L intertrochanteric fracture on 12/23. Noted to have gradually worsening pain at site. 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 On the AM of presentation, pt was noted to be more confused and lethargic. Pt's wife left pt home alone w/ neighbor to tell orthopod in the office. Orthopedic Surgery called hospitalist for consideration for direct admit. Hospitalist agreed to direct admit from home. Of note, pt has been with indwelling cath since 12/23, followed by Urology for failed voiding trials. Family reports very cloudy urine leading up to visit   Assessment and Plan:  Delirium -Mentation seems much improved since arrival to [facility] -UA is strongly suggestive of UTI, treated with 5 days of Rocephin   UTI related to chronic foley cath -Pt has hx of BPH and chronic bladder outlet obstruction, requiring foley and followed by Urology as outpatient -UA is highly suggestive of UTI with leuks and nitrates -Urine culture is inconclusive  -Tolerating rocephin.   Completed 5-day course of Rocephin for complicated UTI.   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 -TOC following for SNF placement   CAD -BP meds were held secondary to soft bp at presentation -remains stable   CKD 3a -Last reported Cr of 1.46 from 12/23 -Cr stable at 1.09    COPD -no wheezing on exam -on minimal O2 support   HTN -BP meds were held at admit secondary to soft BP -SBP improving to the 120-130's. Will restart low dose norvasc '5mg'$  -Cont to hold ARB for now, may resume once BP and renal function allows   HLD -continue statin per home regimen    BPH -Followed by Urology  -Family reports pt failed multiple voiding trials -changed foley 3/8 given UTI -would have pt continuing to follow up with Urology as outpatient       Consultants: Orthopedics Procedures performed:  Removal of [place] cephalomedullary nail conversion to total hip arthroplasty with revision femoral component  Disposition: Skilled nursing facility Diet recommendation:  Discharge Diet Orders (From admission, onward)     Start     Ordered   08/09/22 0000  Diet - low sodium heart healthy        08/09/22 1220           Cardiac diet DISCHARGE MEDICATION: Allergies as of 08/09/2022  Reactions   Hydrochlorothiazide W-spironolactone    Other Reaction(s): dizziness, fainting        Medication List     STOP taking these medications    losartan 25 MG tablet Commonly known as: COZAAR       TAKE these medications    albuterol 108 (90 Base) MCG/ACT inhaler Commonly known as: VENTOLIN HFA Inhale 2 puffs into the lungs every 6 (six) hours as needed for wheezing or shortness of breath.   amLODipine 5 MG tablet Commonly  known as: NORVASC Take 1 tablet (5 mg total) by mouth daily. What changed:  medication strength how much to take   Breztri Aerosphere 160-9-4.8 MCG/ACT Aero Generic drug: Budeson-Glycopyrrol-Formoterol Inhale 2 puffs into the lungs in the morning and at bedtime.   buPROPion 150 MG 24 hr tablet Commonly known as: WELLBUTRIN XL Take 150 mg by mouth every morning.   enoxaparin 40 MG/0.4ML injection Commonly known as: LOVENOX Inject 0.4 mLs (40 mg total) into the skin daily for 26 days.   feeding supplement Liqd Take 237 mLs by mouth 2 (two) times daily between meals.   ipratropium-albuterol 0.5-2.5 (3) MG/3ML Soln Commonly known as: DUONEB Take 3 mLs by nebulization every 4 (four) hours as needed. What changed: reasons to take this   latanoprost 0.005 % ophthalmic solution Commonly known as: XALATAN Place 1 drop into both eyes at bedtime.   levothyroxine 112 MCG tablet Commonly known as: SYNTHROID Take 112 mcg by mouth daily.   oxyCODONE 5 MG immediate release tablet Commonly known as: Roxicodone Take 1 tablet (5 mg total) by mouth every 4 (four) hours as needed for up to 7 days for severe pain or moderate pain. What changed: reasons to take this   rOPINIRole 1 MG tablet Commonly known as: REQUIP Take 1 mg by mouth 3 (three) times daily.   rosuvastatin 5 MG tablet Commonly known as: CRESTOR Take 5 mg by mouth daily.   senna-docusate 8.6-50 MG tablet Commonly known as: Senokot-S Take 1 tablet by mouth at bedtime as needed for mild constipation.   sertraline 100 MG tablet Commonly known as: ZOLOFT Take 100 mg by mouth daily.   tamsulosin 0.4 MG Caps capsule Commonly known as: FLOMAX Take 0.4 mg by mouth daily.   Vitamin D (Ergocalciferol) 1.25 MG (50000 UNIT) Caps capsule Commonly known as: DRISDOL Take 1 capsule (50,000 Units total) by mouth every 7 (seven) days.        Contact information for follow-up providers     Willaim Sheng, MD Follow up  in 2 week(s).   Specialty: Orthopedic Surgery Contact information: [address] [place] [address] [phone]              Contact information for after-discharge care     Destination     [facility]erred [facility] .   Service: Skilled Nursing Contact information: [address] Test Update Address [place] [phone]                    Discharge Exam: There were no vitals filed for this visit. General-appears in no acute distress Heart-S1-S2, regular, no murmur auscultated Lungs-clear to auscultation bilaterally, no wheezing or crackles auscultated Abdomen-soft, nontender, no organomegaly Extremities-no edema in the lower extremities Neuro-alert, oriented x3, no focal deficit noted  Condition at discharge: good  The results of significant diagnostics from this hospitalization (including imaging, microbiology, ancillary and laboratory) are listed below for reference.   Imaging  Studies: DG HIP UNILAT W OR W/O PELVIS 2-3 VIEWS LEFT  Result Date: 08/05/2022 CLINICAL DATA:  Postoperative state. EXAM: DG HIP (WITH OR WITHOUT PELVIS) 2-3V LEFT COMPARISON:  Radiograph 05/10/2022 FINDINGS: The previous intramedullary nail and fixating screw hardware in the left proximal femur has been removed, new total hip arthroplasty with cerclage wire about the femoral stem. Ghost tracks at site of prior hardware are faintly visualized. Recent postsurgical change includes air and edema in the soft tissues. There are screws in the right proximal femur, unchanged. IMPRESSION: Removal of left proximal femur fixating hardware with new left total hip arthroplasty. No immediate postoperative complication. Electronically Signed   By: Keith Rake M.D.   On: 08/05/2022 17:24   DG HIP UNILAT WITH PELVIS 1V LEFT  Result Date: 08/05/2022 CLINICAL DATA:  Conversion to total hip replacement from IM femoral nail. EXAM: DG HIP (WITH OR WITHOUT  PELVIS) 1V*L* COMPARISON:  05/10/2022 FINDINGS: 4 intraoperative spot fluoro films obtained during total hip replacement. Single cerclage wire noted proximal femur. No hardware complication visible on the provided images. IMPRESSION: Status post left total hip replacement. Electronically Signed   By: Misty Stanley M.D.   On: 08/05/2022 10:54   DG C-Arm 1-60 Min-No Report  Result Date: 08/05/2022 Fluoroscopy was utilized by the requesting physician.  No radiographic interpretation.   DG CHEST PORT 1 VIEW  Result Date: 08/03/2022 CLINICAL DATA:  Toxic metabolic encephalopathy. EXAM: PORTABLE CHEST 1 VIEW COMPARISON:  05/09/2022, CT 09/07/2021 FINDINGS: Chronic elevation of right hemidiaphragm with adjacent basilar atelectasis/scarring. The heart is normal in size with stable mediastinal contours. Aortic atherosclerosis. Mild ill-defined atelectasis at the left lung base. No pulmonary edema, pleural effusion or pneumothorax. There are remote left rib fractures. IMPRESSION: 1. Chronic elevation of the right hemidiaphragm with adjacent atelectasis/scarring. 2. Mild ill-defined atelectasis at the left lung base. Electronically Signed   By: Keith Rake M.D.   On: 08/03/2022 18:14    Microbiology: Results for orders placed or performed during the hospital encounter of 08/03/22  Culture, Urine (Do not remove urinary catheter, catheter placed by urology or difficult to place)     Status: Abnormal   Collection Time: 08/03/22  7:31 PM   Specimen: Urine, Catheterized  Result Value Ref Range Status   Specimen Description URINE, CATHETERIZED  Final   Special Requests   Final    NONE Performed at [facility], [address], [place], [place] [address]    Culture MULTIPLE SPECIES PRESENT, SUGGEST RECOLLECTION (A)  Final   Report Status 08/05/2022 FINAL  Final  Surgical pcr screen     Status: None   Collection Time: 08/04/22  6:26 PM   Specimen: Nasal Mucosa; Nasal Swab  Result Value Ref Range  Status   MRSA, PCR NEGATIVE NEGATIVE Final   Staphylococcus aureus NEGATIVE NEGATIVE Final    Comment: (NOTE) The Xpert SA Assay (FDA approved for NASAL specimens in patients 64 years of age and older), is one component of a comprehensive surveillance program. It is not intended to diagnose infection nor to guide or monitor treatment. Performed at [facility], [place] [address] [place], [place] [address]   Surgical pcr screen     Status: None   Collection Time: 08/05/22  5:42 AM   Specimen: Nasal Mucosa; Nasal Swab  Result Value Ref Range Status   MRSA, PCR NEGATIVE NEGATIVE Final   Staphylococcus aureus NEGATIVE NEGATIVE Final    Comment: (NOTE) The Xpert SA Assay (FDA approved  for NASAL specimens in patients 77 years of age and older), is one component of a comprehensive surveillance program. It is not intended to diagnose infection nor to guide or monitor treatment. Performed at [facility], [place] [address], [place], [place] [address]   Aerobic/Anaerobic Culture w Gram Stain (surgical/deep wound)     Status: None (Preliminary result)   Collection Time: 08/05/22  9:16 AM   Specimen: Synovial, Left Hip; Tissue  Result Value Ref Range Status   Specimen Description TISSUE  Final   Special Requests LEFT HIP 1  Final   Gram Stain NO ORGANISMS SEEN NO WBC SEEN   Final   Culture   Final    NO GROWTH 4 DAYS NO ANAEROBES ISOLATED; CULTURE IN PROGRESS FOR 5 DAYS Performed at [facility], [address], [place], [place] [address]    Report Status PENDING  Incomplete  Aerobic/Anaerobic Culture w Gram Stain (surgical/deep wound)     Status: None (Preliminary result)   Collection Time: 08/05/22  9:30 AM   Specimen: Synovial, Left Hip; Tissue  Result Value Ref Range Status   Specimen Description TISSUE  Final   Special Requests LEFT HIP 2  Final   Gram Stain NO ORGANISMS SEEN NO WBC SEEN   Final   Culture   Final    NO GROWTH 4 DAYS NO ANAEROBES ISOLATED;  CULTURE IN PROGRESS FOR 5 DAYS Performed at [facility], [address], [place], [place] [address]    Report Status PENDING  Incomplete    Labs: CBC: Recent Labs  Lab 08/03/22 1700 08/04/22 0417 08/06/22 0758 08/07/22 0316 08/08/22 0219 08/09/22 0202  WBC 4.7 5.4 11.2* 8.5 10.7* 9.0  NEUTROABS 3.6  --   --   --   --   --   HGB 13.1 12.3* 11.2* 10.5* 9.7* 9.3*  HCT 39.2 36.3* 33.2* 31.2* 28.5* 27.6*  MCV 89.9 88.3 86.7 88.9 87.2 88.2  PLT 117* 131* 152 143* 143* [id number]   Basic Metabolic Panel: Recent Labs  Lab 08/05/22 0439 08/06/22 0758 08/07/22 0316 08/08/22 0219 08/09/22 0202  NA 136 137 135 134* 135  K 3.9 4.6 4.1 3.8 3.5  CL 104 103 102 103 104  CO2 '24 24 24 23 23  '$ GLUCOSE 99 115* 107* 117* 121*  BUN 20 24* 31* 31* 31*  CREATININE 1.15 1.24 1.31* 1.25* 1.09  CALCIUM 8.7* 8.8* 8.6* 8.2* 8.4*   Liver Function Tests: Recent Labs  Lab 08/04/22 0417 08/05/22 0439 08/07/22 0316 08/08/22 0219 08/09/22 0202  AST '18 19 17 18 '$ 55*  ALT '9 11 9 10 30  '$ ALKPHOS 113 101 91 83 87  BILITOT 1.2 0.9 0.6 0.4 0.1*  PROT 6.1* 5.7* 5.3* 5.3* 5.1*  ALBUMIN 3.0* 2.7* 2.5* 2.3* 2.1*   CBG: No results for input(s): "GLUCAP" in the last 168 hours.  Discharge time spent: greater than 30 minutes.  Signed: Oswald Hillock, MD [facility] 08/09/2022

## 2022-08-09 NOTE — Progress Notes (Signed)
Report given to Cindy.  

## 2022-08-09 NOTE — TOC Transition Note (Addendum)
Transition of Care [facility]) - CM/SW Discharge Note   Patient Details  Name: Timothy Spencer MRN: [id number] Date of Birth: 09-Sep-1940  [facility] CM/SW Contact:  Joanne Chars, LCSW Phone Number: 08/09/2022, 12:57 PM   Clinical Narrative:   Pt discharging to [facility], room 407P.  RN call report to  [phone].  Main number: [phone].    1000: CSW confirmed with [facility] that they are ready to receive pt today.   Final next level of care: Skilled Nursing Facility Barriers to Discharge: Barriers Resolved   Patient Goals and CMS Choice CMS Medicare.gov Compare Post Acute Care list provided to:: Patient Represenative (must comment) (wife [name]) Choice offered to / list presented to : Spouse  Discharge Placement                Patient chooses bed at:  [facility]) Patient to be transferred to facility by: [facility] Name of family member notified: wife [name] in room Patient and family notified of of transfer: 08/09/22  Discharge Plan and Services Additional resources added to the After Visit Summary for   In-house Referral: Clinical Social Work Discharge Planning Services: CM Consult Post Acute Care Choice: [facility]                               Social Determinants of Health (SDOH) Interventions SDOH Screenings   Food Insecurity: No Food Insecurity (08/03/2022)  Housing: Low Risk  (08/03/2022)  Transportation Needs: No Transportation Needs (08/03/2022)  Utilities: Not At Risk (08/03/2022)  Depression (PHQ2-9): Low Risk  (05/06/2019)  Tobacco Use: Medium Risk (08/07/2022)     Readmission Risk Interventions     No data to display

## 2022-08-10 DIAGNOSIS — J441 Chronic obstructive pulmonary disease with (acute) exacerbation: Secondary | ICD-10-CM | POA: Diagnosis not present

## 2022-08-10 DIAGNOSIS — R531 Weakness: Secondary | ICD-10-CM | POA: Diagnosis not present

## 2022-08-10 DIAGNOSIS — Z87438 Personal history of other diseases of male genital organs: Secondary | ICD-10-CM | POA: Diagnosis not present

## 2022-08-10 LAB — AEROBIC/ANAEROBIC CULTURE W GRAM STAIN (SURGICAL/DEEP WOUND)
Culture: NO GROWTH
Culture: NO GROWTH
Gram Stain: NONE SEEN
Gram Stain: NONE SEEN

## 2022-08-15 ENCOUNTER — Ambulatory Visit: Payer: Medicare Other | Admitting: Sports Medicine

## 2022-08-15 DIAGNOSIS — S72142D Displaced intertrochanteric fracture of left femur, subsequent encounter for closed fracture with routine healing: Secondary | ICD-10-CM | POA: Diagnosis not present

## 2022-08-17 DIAGNOSIS — N1831 Chronic kidney disease, stage 3a: Secondary | ICD-10-CM

## 2022-08-17 DIAGNOSIS — H353221 Exudative age-related macular degeneration, left eye, with active choroidal neovascularization: Secondary | ICD-10-CM | POA: Diagnosis not present

## 2022-08-17 DIAGNOSIS — R339 Retention of urine, unspecified: Secondary | ICD-10-CM | POA: Diagnosis not present

## 2022-08-17 DIAGNOSIS — H43822 Vitreomacular adhesion, left eye: Secondary | ICD-10-CM | POA: Diagnosis not present

## 2022-08-17 DIAGNOSIS — R531 Weakness: Secondary | ICD-10-CM | POA: Diagnosis not present

## 2022-08-17 DIAGNOSIS — J449 Chronic obstructive pulmonary disease, unspecified: Secondary | ICD-10-CM | POA: Diagnosis not present

## 2022-08-17 DIAGNOSIS — H43813 Vitreous degeneration, bilateral: Secondary | ICD-10-CM | POA: Diagnosis not present

## 2022-08-17 DIAGNOSIS — H353114 Nonexudative age-related macular degeneration, right eye, advanced atrophic with subfoveal involvement: Secondary | ICD-10-CM | POA: Diagnosis not present

## 2022-08-17 DIAGNOSIS — H35371 Puckering of macula, right eye: Secondary | ICD-10-CM | POA: Diagnosis not present

## 2022-08-17 DIAGNOSIS — I10 Essential (primary) hypertension: Secondary | ICD-10-CM | POA: Diagnosis not present

## 2022-08-27 DIAGNOSIS — J449 Chronic obstructive pulmonary disease, unspecified: Secondary | ICD-10-CM | POA: Diagnosis not present

## 2022-08-27 DIAGNOSIS — G629 Polyneuropathy, unspecified: Secondary | ICD-10-CM | POA: Diagnosis not present

## 2022-08-27 DIAGNOSIS — F419 Anxiety disorder, unspecified: Secondary | ICD-10-CM | POA: Diagnosis not present

## 2022-08-27 DIAGNOSIS — I440 Atrioventricular block, first degree: Secondary | ICD-10-CM | POA: Diagnosis not present

## 2022-08-27 DIAGNOSIS — Q791 Other congenital malformations of diaphragm: Secondary | ICD-10-CM | POA: Diagnosis not present

## 2022-08-27 DIAGNOSIS — D696 Thrombocytopenia, unspecified: Secondary | ICD-10-CM | POA: Diagnosis not present

## 2022-08-27 DIAGNOSIS — E039 Hypothyroidism, unspecified: Secondary | ICD-10-CM | POA: Diagnosis not present

## 2022-08-27 DIAGNOSIS — S72142K Displaced intertrochanteric fracture of left femur, subsequent encounter for closed fracture with nonunion: Secondary | ICD-10-CM | POA: Diagnosis not present

## 2022-08-27 DIAGNOSIS — E785 Hyperlipidemia, unspecified: Secondary | ICD-10-CM | POA: Diagnosis not present

## 2022-08-27 DIAGNOSIS — I2510 Atherosclerotic heart disease of native coronary artery without angina pectoris: Secondary | ICD-10-CM | POA: Diagnosis not present

## 2022-08-27 DIAGNOSIS — N401 Enlarged prostate with lower urinary tract symptoms: Secondary | ICD-10-CM | POA: Diagnosis not present

## 2022-08-27 DIAGNOSIS — I129 Hypertensive chronic kidney disease with stage 1 through stage 4 chronic kidney disease, or unspecified chronic kidney disease: Secondary | ICD-10-CM | POA: Diagnosis not present

## 2022-08-27 DIAGNOSIS — G2581 Restless legs syndrome: Secondary | ICD-10-CM | POA: Diagnosis not present

## 2022-08-27 DIAGNOSIS — H409 Unspecified glaucoma: Secondary | ICD-10-CM | POA: Diagnosis not present

## 2022-08-27 DIAGNOSIS — N1832 Chronic kidney disease, stage 3b: Secondary | ICD-10-CM | POA: Diagnosis not present

## 2022-08-27 DIAGNOSIS — K219 Gastro-esophageal reflux disease without esophagitis: Secondary | ICD-10-CM | POA: Diagnosis not present

## 2022-08-27 DIAGNOSIS — J439 Emphysema, unspecified: Secondary | ICD-10-CM | POA: Diagnosis not present

## 2022-08-27 DIAGNOSIS — I4510 Unspecified right bundle-branch block: Secondary | ICD-10-CM | POA: Diagnosis not present

## 2022-08-27 DIAGNOSIS — E8801 Alpha-1-antitrypsin deficiency: Secondary | ICD-10-CM | POA: Diagnosis not present

## 2022-08-27 DIAGNOSIS — N529 Male erectile dysfunction, unspecified: Secondary | ICD-10-CM | POA: Diagnosis not present

## 2022-08-27 DIAGNOSIS — R338 Other retention of urine: Secondary | ICD-10-CM | POA: Diagnosis not present

## 2022-08-27 DIAGNOSIS — F32A Depression, unspecified: Secondary | ICD-10-CM | POA: Diagnosis not present

## 2022-08-27 DIAGNOSIS — M109 Gout, unspecified: Secondary | ICD-10-CM | POA: Diagnosis not present

## 2022-08-27 DIAGNOSIS — E43 Unspecified severe protein-calorie malnutrition: Secondary | ICD-10-CM | POA: Diagnosis not present

## 2022-08-27 DIAGNOSIS — H3530 Unspecified macular degeneration: Secondary | ICD-10-CM | POA: Diagnosis not present

## 2022-08-28 DIAGNOSIS — H3530 Unspecified macular degeneration: Secondary | ICD-10-CM | POA: Diagnosis not present

## 2022-08-28 DIAGNOSIS — E8801 Alpha-1-antitrypsin deficiency: Secondary | ICD-10-CM | POA: Diagnosis not present

## 2022-08-28 DIAGNOSIS — G629 Polyneuropathy, unspecified: Secondary | ICD-10-CM | POA: Diagnosis not present

## 2022-08-28 DIAGNOSIS — S72142K Displaced intertrochanteric fracture of left femur, subsequent encounter for closed fracture with nonunion: Secondary | ICD-10-CM | POA: Diagnosis not present

## 2022-08-28 DIAGNOSIS — J439 Emphysema, unspecified: Secondary | ICD-10-CM | POA: Diagnosis not present

## 2022-08-28 DIAGNOSIS — E039 Hypothyroidism, unspecified: Secondary | ICD-10-CM | POA: Diagnosis not present

## 2022-08-28 DIAGNOSIS — E785 Hyperlipidemia, unspecified: Secondary | ICD-10-CM | POA: Diagnosis not present

## 2022-08-28 DIAGNOSIS — N1832 Chronic kidney disease, stage 3b: Secondary | ICD-10-CM | POA: Diagnosis not present

## 2022-08-28 DIAGNOSIS — D696 Thrombocytopenia, unspecified: Secondary | ICD-10-CM | POA: Diagnosis not present

## 2022-08-28 DIAGNOSIS — J449 Chronic obstructive pulmonary disease, unspecified: Secondary | ICD-10-CM | POA: Diagnosis not present

## 2022-08-28 DIAGNOSIS — E43 Unspecified severe protein-calorie malnutrition: Secondary | ICD-10-CM | POA: Diagnosis not present

## 2022-08-28 DIAGNOSIS — F32A Depression, unspecified: Secondary | ICD-10-CM | POA: Diagnosis not present

## 2022-08-28 DIAGNOSIS — I129 Hypertensive chronic kidney disease with stage 1 through stage 4 chronic kidney disease, or unspecified chronic kidney disease: Secondary | ICD-10-CM | POA: Diagnosis not present

## 2022-08-28 DIAGNOSIS — F419 Anxiety disorder, unspecified: Secondary | ICD-10-CM | POA: Diagnosis not present

## 2022-08-28 DIAGNOSIS — I2510 Atherosclerotic heart disease of native coronary artery without angina pectoris: Secondary | ICD-10-CM | POA: Diagnosis not present

## 2022-08-28 DIAGNOSIS — I4510 Unspecified right bundle-branch block: Secondary | ICD-10-CM | POA: Diagnosis not present

## 2022-08-30 ENCOUNTER — Inpatient Hospital Stay: Admit: 2022-08-30 | Payer: Medicare Other | Admitting: Orthopedic Surgery

## 2022-08-30 DIAGNOSIS — N401 Enlarged prostate with lower urinary tract symptoms: Secondary | ICD-10-CM | POA: Diagnosis not present

## 2022-08-30 DIAGNOSIS — N390 Urinary tract infection, site not specified: Secondary | ICD-10-CM | POA: Diagnosis not present

## 2022-08-30 DIAGNOSIS — R338 Other retention of urine: Secondary | ICD-10-CM | POA: Diagnosis not present

## 2022-08-30 DIAGNOSIS — N3000 Acute cystitis without hematuria: Secondary | ICD-10-CM | POA: Diagnosis not present

## 2022-08-30 DIAGNOSIS — B965 Pseudomonas (aeruginosa) (mallei) (pseudomallei) as the cause of diseases classified elsewhere: Secondary | ICD-10-CM | POA: Diagnosis not present

## 2022-08-30 SURGERY — CONVERSION, PREVIOUS HIP SURGERY, TO TOTAL HIP ARTHROPLASTY
Anesthesia: Choice | Site: Hip | Laterality: Left

## 2022-08-31 DIAGNOSIS — R338 Other retention of urine: Secondary | ICD-10-CM | POA: Diagnosis not present

## 2022-08-31 DIAGNOSIS — N401 Enlarged prostate with lower urinary tract symptoms: Secondary | ICD-10-CM | POA: Diagnosis not present

## 2022-09-01 DIAGNOSIS — I4510 Unspecified right bundle-branch block: Secondary | ICD-10-CM | POA: Diagnosis not present

## 2022-09-01 DIAGNOSIS — D696 Thrombocytopenia, unspecified: Secondary | ICD-10-CM | POA: Diagnosis not present

## 2022-09-01 DIAGNOSIS — E785 Hyperlipidemia, unspecified: Secondary | ICD-10-CM | POA: Diagnosis not present

## 2022-09-01 DIAGNOSIS — E8801 Alpha-1-antitrypsin deficiency: Secondary | ICD-10-CM | POA: Diagnosis not present

## 2022-09-01 DIAGNOSIS — G629 Polyneuropathy, unspecified: Secondary | ICD-10-CM | POA: Diagnosis not present

## 2022-09-01 DIAGNOSIS — J439 Emphysema, unspecified: Secondary | ICD-10-CM | POA: Diagnosis not present

## 2022-09-01 DIAGNOSIS — S72142K Displaced intertrochanteric fracture of left femur, subsequent encounter for closed fracture with nonunion: Secondary | ICD-10-CM | POA: Diagnosis not present

## 2022-09-01 DIAGNOSIS — F419 Anxiety disorder, unspecified: Secondary | ICD-10-CM | POA: Diagnosis not present

## 2022-09-01 DIAGNOSIS — N1832 Chronic kidney disease, stage 3b: Secondary | ICD-10-CM | POA: Diagnosis not present

## 2022-09-01 DIAGNOSIS — F32A Depression, unspecified: Secondary | ICD-10-CM | POA: Diagnosis not present

## 2022-09-01 DIAGNOSIS — J449 Chronic obstructive pulmonary disease, unspecified: Secondary | ICD-10-CM | POA: Diagnosis not present

## 2022-09-01 DIAGNOSIS — I2510 Atherosclerotic heart disease of native coronary artery without angina pectoris: Secondary | ICD-10-CM | POA: Diagnosis not present

## 2022-09-01 DIAGNOSIS — H3530 Unspecified macular degeneration: Secondary | ICD-10-CM | POA: Diagnosis not present

## 2022-09-01 DIAGNOSIS — E43 Unspecified severe protein-calorie malnutrition: Secondary | ICD-10-CM | POA: Diagnosis not present

## 2022-09-01 DIAGNOSIS — I129 Hypertensive chronic kidney disease with stage 1 through stage 4 chronic kidney disease, or unspecified chronic kidney disease: Secondary | ICD-10-CM | POA: Diagnosis not present

## 2022-09-01 DIAGNOSIS — E039 Hypothyroidism, unspecified: Secondary | ICD-10-CM | POA: Diagnosis not present

## 2022-09-05 DIAGNOSIS — J449 Chronic obstructive pulmonary disease, unspecified: Secondary | ICD-10-CM | POA: Diagnosis not present

## 2022-09-05 DIAGNOSIS — H3530 Unspecified macular degeneration: Secondary | ICD-10-CM | POA: Diagnosis not present

## 2022-09-05 DIAGNOSIS — E785 Hyperlipidemia, unspecified: Secondary | ICD-10-CM | POA: Diagnosis not present

## 2022-09-05 DIAGNOSIS — I129 Hypertensive chronic kidney disease with stage 1 through stage 4 chronic kidney disease, or unspecified chronic kidney disease: Secondary | ICD-10-CM | POA: Diagnosis not present

## 2022-09-05 DIAGNOSIS — G629 Polyneuropathy, unspecified: Secondary | ICD-10-CM | POA: Diagnosis not present

## 2022-09-05 DIAGNOSIS — E8801 Alpha-1-antitrypsin deficiency: Secondary | ICD-10-CM | POA: Diagnosis not present

## 2022-09-05 DIAGNOSIS — S72142K Displaced intertrochanteric fracture of left femur, subsequent encounter for closed fracture with nonunion: Secondary | ICD-10-CM | POA: Diagnosis not present

## 2022-09-05 DIAGNOSIS — I2510 Atherosclerotic heart disease of native coronary artery without angina pectoris: Secondary | ICD-10-CM | POA: Diagnosis not present

## 2022-09-05 DIAGNOSIS — D696 Thrombocytopenia, unspecified: Secondary | ICD-10-CM | POA: Diagnosis not present

## 2022-09-05 DIAGNOSIS — I4510 Unspecified right bundle-branch block: Secondary | ICD-10-CM | POA: Diagnosis not present

## 2022-09-05 DIAGNOSIS — N1832 Chronic kidney disease, stage 3b: Secondary | ICD-10-CM | POA: Diagnosis not present

## 2022-09-05 DIAGNOSIS — J439 Emphysema, unspecified: Secondary | ICD-10-CM | POA: Diagnosis not present

## 2022-09-05 DIAGNOSIS — E43 Unspecified severe protein-calorie malnutrition: Secondary | ICD-10-CM | POA: Diagnosis not present

## 2022-09-05 DIAGNOSIS — E039 Hypothyroidism, unspecified: Secondary | ICD-10-CM | POA: Diagnosis not present

## 2022-09-05 DIAGNOSIS — F419 Anxiety disorder, unspecified: Secondary | ICD-10-CM | POA: Diagnosis not present

## 2022-09-05 DIAGNOSIS — F32A Depression, unspecified: Secondary | ICD-10-CM | POA: Diagnosis not present

## 2022-09-06 DIAGNOSIS — S72142K Displaced intertrochanteric fracture of left femur, subsequent encounter for closed fracture with nonunion: Secondary | ICD-10-CM | POA: Diagnosis not present

## 2022-09-06 DIAGNOSIS — D696 Thrombocytopenia, unspecified: Secondary | ICD-10-CM | POA: Diagnosis not present

## 2022-09-06 DIAGNOSIS — I2510 Atherosclerotic heart disease of native coronary artery without angina pectoris: Secondary | ICD-10-CM | POA: Diagnosis not present

## 2022-09-06 DIAGNOSIS — F419 Anxiety disorder, unspecified: Secondary | ICD-10-CM | POA: Diagnosis not present

## 2022-09-06 DIAGNOSIS — E43 Unspecified severe protein-calorie malnutrition: Secondary | ICD-10-CM | POA: Diagnosis not present

## 2022-09-06 DIAGNOSIS — J449 Chronic obstructive pulmonary disease, unspecified: Secondary | ICD-10-CM | POA: Diagnosis not present

## 2022-09-06 DIAGNOSIS — I4510 Unspecified right bundle-branch block: Secondary | ICD-10-CM | POA: Diagnosis not present

## 2022-09-06 DIAGNOSIS — H3530 Unspecified macular degeneration: Secondary | ICD-10-CM | POA: Diagnosis not present

## 2022-09-06 DIAGNOSIS — G629 Polyneuropathy, unspecified: Secondary | ICD-10-CM | POA: Diagnosis not present

## 2022-09-06 DIAGNOSIS — N1832 Chronic kidney disease, stage 3b: Secondary | ICD-10-CM | POA: Diagnosis not present

## 2022-09-06 DIAGNOSIS — E785 Hyperlipidemia, unspecified: Secondary | ICD-10-CM | POA: Diagnosis not present

## 2022-09-06 DIAGNOSIS — J439 Emphysema, unspecified: Secondary | ICD-10-CM | POA: Diagnosis not present

## 2022-09-06 DIAGNOSIS — F32A Depression, unspecified: Secondary | ICD-10-CM | POA: Diagnosis not present

## 2022-09-06 DIAGNOSIS — E8801 Alpha-1-antitrypsin deficiency: Secondary | ICD-10-CM | POA: Diagnosis not present

## 2022-09-06 DIAGNOSIS — E039 Hypothyroidism, unspecified: Secondary | ICD-10-CM | POA: Diagnosis not present

## 2022-09-06 DIAGNOSIS — I129 Hypertensive chronic kidney disease with stage 1 through stage 4 chronic kidney disease, or unspecified chronic kidney disease: Secondary | ICD-10-CM | POA: Diagnosis not present

## 2022-09-07 DIAGNOSIS — H3530 Unspecified macular degeneration: Secondary | ICD-10-CM | POA: Diagnosis not present

## 2022-09-07 DIAGNOSIS — D696 Thrombocytopenia, unspecified: Secondary | ICD-10-CM | POA: Diagnosis not present

## 2022-09-07 DIAGNOSIS — G629 Polyneuropathy, unspecified: Secondary | ICD-10-CM | POA: Diagnosis not present

## 2022-09-07 DIAGNOSIS — S72142K Displaced intertrochanteric fracture of left femur, subsequent encounter for closed fracture with nonunion: Secondary | ICD-10-CM | POA: Diagnosis not present

## 2022-09-07 DIAGNOSIS — F419 Anxiety disorder, unspecified: Secondary | ICD-10-CM | POA: Diagnosis not present

## 2022-09-07 DIAGNOSIS — J439 Emphysema, unspecified: Secondary | ICD-10-CM | POA: Diagnosis not present

## 2022-09-07 DIAGNOSIS — E785 Hyperlipidemia, unspecified: Secondary | ICD-10-CM | POA: Diagnosis not present

## 2022-09-07 DIAGNOSIS — I4510 Unspecified right bundle-branch block: Secondary | ICD-10-CM | POA: Diagnosis not present

## 2022-09-07 DIAGNOSIS — E8801 Alpha-1-antitrypsin deficiency: Secondary | ICD-10-CM | POA: Diagnosis not present

## 2022-09-07 DIAGNOSIS — E039 Hypothyroidism, unspecified: Secondary | ICD-10-CM | POA: Diagnosis not present

## 2022-09-07 DIAGNOSIS — I129 Hypertensive chronic kidney disease with stage 1 through stage 4 chronic kidney disease, or unspecified chronic kidney disease: Secondary | ICD-10-CM | POA: Diagnosis not present

## 2022-09-07 DIAGNOSIS — E43 Unspecified severe protein-calorie malnutrition: Secondary | ICD-10-CM | POA: Diagnosis not present

## 2022-09-07 DIAGNOSIS — N1832 Chronic kidney disease, stage 3b: Secondary | ICD-10-CM | POA: Diagnosis not present

## 2022-09-07 DIAGNOSIS — J449 Chronic obstructive pulmonary disease, unspecified: Secondary | ICD-10-CM | POA: Diagnosis not present

## 2022-09-07 DIAGNOSIS — I2510 Atherosclerotic heart disease of native coronary artery without angina pectoris: Secondary | ICD-10-CM | POA: Diagnosis not present

## 2022-09-07 DIAGNOSIS — F32A Depression, unspecified: Secondary | ICD-10-CM | POA: Diagnosis not present

## 2022-09-11 DIAGNOSIS — J439 Emphysema, unspecified: Secondary | ICD-10-CM | POA: Diagnosis not present

## 2022-09-11 DIAGNOSIS — D696 Thrombocytopenia, unspecified: Secondary | ICD-10-CM | POA: Diagnosis not present

## 2022-09-11 DIAGNOSIS — H3530 Unspecified macular degeneration: Secondary | ICD-10-CM | POA: Diagnosis not present

## 2022-09-11 DIAGNOSIS — I2510 Atherosclerotic heart disease of native coronary artery without angina pectoris: Secondary | ICD-10-CM | POA: Diagnosis not present

## 2022-09-11 DIAGNOSIS — E43 Unspecified severe protein-calorie malnutrition: Secondary | ICD-10-CM | POA: Diagnosis not present

## 2022-09-11 DIAGNOSIS — E8801 Alpha-1-antitrypsin deficiency: Secondary | ICD-10-CM | POA: Diagnosis not present

## 2022-09-11 DIAGNOSIS — E785 Hyperlipidemia, unspecified: Secondary | ICD-10-CM | POA: Diagnosis not present

## 2022-09-11 DIAGNOSIS — F32A Depression, unspecified: Secondary | ICD-10-CM | POA: Diagnosis not present

## 2022-09-11 DIAGNOSIS — E039 Hypothyroidism, unspecified: Secondary | ICD-10-CM | POA: Diagnosis not present

## 2022-09-11 DIAGNOSIS — N1832 Chronic kidney disease, stage 3b: Secondary | ICD-10-CM | POA: Diagnosis not present

## 2022-09-11 DIAGNOSIS — G629 Polyneuropathy, unspecified: Secondary | ICD-10-CM | POA: Diagnosis not present

## 2022-09-11 DIAGNOSIS — F419 Anxiety disorder, unspecified: Secondary | ICD-10-CM | POA: Diagnosis not present

## 2022-09-11 DIAGNOSIS — S72142K Displaced intertrochanteric fracture of left femur, subsequent encounter for closed fracture with nonunion: Secondary | ICD-10-CM | POA: Diagnosis not present

## 2022-09-11 DIAGNOSIS — J449 Chronic obstructive pulmonary disease, unspecified: Secondary | ICD-10-CM | POA: Diagnosis not present

## 2022-09-11 DIAGNOSIS — I4510 Unspecified right bundle-branch block: Secondary | ICD-10-CM | POA: Diagnosis not present

## 2022-09-11 DIAGNOSIS — I129 Hypertensive chronic kidney disease with stage 1 through stage 4 chronic kidney disease, or unspecified chronic kidney disease: Secondary | ICD-10-CM | POA: Diagnosis not present

## 2022-09-14 DIAGNOSIS — S72142D Displaced intertrochanteric fracture of left femur, subsequent encounter for closed fracture with routine healing: Secondary | ICD-10-CM | POA: Diagnosis not present

## 2022-09-15 DIAGNOSIS — J439 Emphysema, unspecified: Secondary | ICD-10-CM | POA: Diagnosis not present

## 2022-09-15 DIAGNOSIS — F419 Anxiety disorder, unspecified: Secondary | ICD-10-CM | POA: Diagnosis not present

## 2022-09-15 DIAGNOSIS — E785 Hyperlipidemia, unspecified: Secondary | ICD-10-CM | POA: Diagnosis not present

## 2022-09-15 DIAGNOSIS — E039 Hypothyroidism, unspecified: Secondary | ICD-10-CM | POA: Diagnosis not present

## 2022-09-15 DIAGNOSIS — N1832 Chronic kidney disease, stage 3b: Secondary | ICD-10-CM | POA: Diagnosis not present

## 2022-09-15 DIAGNOSIS — G629 Polyneuropathy, unspecified: Secondary | ICD-10-CM | POA: Diagnosis not present

## 2022-09-15 DIAGNOSIS — H3530 Unspecified macular degeneration: Secondary | ICD-10-CM | POA: Diagnosis not present

## 2022-09-15 DIAGNOSIS — J449 Chronic obstructive pulmonary disease, unspecified: Secondary | ICD-10-CM | POA: Diagnosis not present

## 2022-09-15 DIAGNOSIS — E43 Unspecified severe protein-calorie malnutrition: Secondary | ICD-10-CM | POA: Diagnosis not present

## 2022-09-15 DIAGNOSIS — D696 Thrombocytopenia, unspecified: Secondary | ICD-10-CM | POA: Diagnosis not present

## 2022-09-15 DIAGNOSIS — I4510 Unspecified right bundle-branch block: Secondary | ICD-10-CM | POA: Diagnosis not present

## 2022-09-15 DIAGNOSIS — I2510 Atherosclerotic heart disease of native coronary artery without angina pectoris: Secondary | ICD-10-CM | POA: Diagnosis not present

## 2022-09-15 DIAGNOSIS — I129 Hypertensive chronic kidney disease with stage 1 through stage 4 chronic kidney disease, or unspecified chronic kidney disease: Secondary | ICD-10-CM | POA: Diagnosis not present

## 2022-09-15 DIAGNOSIS — F32A Depression, unspecified: Secondary | ICD-10-CM | POA: Diagnosis not present

## 2022-09-15 DIAGNOSIS — E8801 Alpha-1-antitrypsin deficiency: Secondary | ICD-10-CM | POA: Diagnosis not present

## 2022-09-15 DIAGNOSIS — S72142K Displaced intertrochanteric fracture of left femur, subsequent encounter for closed fracture with nonunion: Secondary | ICD-10-CM | POA: Diagnosis not present

## 2022-09-18 DIAGNOSIS — N401 Enlarged prostate with lower urinary tract symptoms: Secondary | ICD-10-CM | POA: Diagnosis not present

## 2022-09-18 DIAGNOSIS — R338 Other retention of urine: Secondary | ICD-10-CM | POA: Diagnosis not present

## 2022-09-19 DIAGNOSIS — G629 Polyneuropathy, unspecified: Secondary | ICD-10-CM | POA: Diagnosis not present

## 2022-09-19 DIAGNOSIS — E785 Hyperlipidemia, unspecified: Secondary | ICD-10-CM | POA: Diagnosis not present

## 2022-09-19 DIAGNOSIS — H3530 Unspecified macular degeneration: Secondary | ICD-10-CM | POA: Diagnosis not present

## 2022-09-19 DIAGNOSIS — J449 Chronic obstructive pulmonary disease, unspecified: Secondary | ICD-10-CM | POA: Diagnosis not present

## 2022-09-19 DIAGNOSIS — N1832 Chronic kidney disease, stage 3b: Secondary | ICD-10-CM | POA: Diagnosis not present

## 2022-09-19 DIAGNOSIS — I2510 Atherosclerotic heart disease of native coronary artery without angina pectoris: Secondary | ICD-10-CM | POA: Diagnosis not present

## 2022-09-19 DIAGNOSIS — F419 Anxiety disorder, unspecified: Secondary | ICD-10-CM | POA: Diagnosis not present

## 2022-09-19 DIAGNOSIS — E43 Unspecified severe protein-calorie malnutrition: Secondary | ICD-10-CM | POA: Diagnosis not present

## 2022-09-19 DIAGNOSIS — J439 Emphysema, unspecified: Secondary | ICD-10-CM | POA: Diagnosis not present

## 2022-09-19 DIAGNOSIS — I129 Hypertensive chronic kidney disease with stage 1 through stage 4 chronic kidney disease, or unspecified chronic kidney disease: Secondary | ICD-10-CM | POA: Diagnosis not present

## 2022-09-19 DIAGNOSIS — E039 Hypothyroidism, unspecified: Secondary | ICD-10-CM | POA: Diagnosis not present

## 2022-09-19 DIAGNOSIS — E8801 Alpha-1-antitrypsin deficiency: Secondary | ICD-10-CM | POA: Diagnosis not present

## 2022-09-19 DIAGNOSIS — S72142K Displaced intertrochanteric fracture of left femur, subsequent encounter for closed fracture with nonunion: Secondary | ICD-10-CM | POA: Diagnosis not present

## 2022-09-19 DIAGNOSIS — D696 Thrombocytopenia, unspecified: Secondary | ICD-10-CM | POA: Diagnosis not present

## 2022-09-19 DIAGNOSIS — I4510 Unspecified right bundle-branch block: Secondary | ICD-10-CM | POA: Diagnosis not present

## 2022-09-19 DIAGNOSIS — F32A Depression, unspecified: Secondary | ICD-10-CM | POA: Diagnosis not present

## 2022-09-21 DIAGNOSIS — J439 Emphysema, unspecified: Secondary | ICD-10-CM | POA: Diagnosis not present

## 2022-09-21 DIAGNOSIS — F32A Depression, unspecified: Secondary | ICD-10-CM | POA: Diagnosis not present

## 2022-09-21 DIAGNOSIS — I4510 Unspecified right bundle-branch block: Secondary | ICD-10-CM | POA: Diagnosis not present

## 2022-09-21 DIAGNOSIS — F419 Anxiety disorder, unspecified: Secondary | ICD-10-CM | POA: Diagnosis not present

## 2022-09-21 DIAGNOSIS — H3530 Unspecified macular degeneration: Secondary | ICD-10-CM | POA: Diagnosis not present

## 2022-09-21 DIAGNOSIS — E039 Hypothyroidism, unspecified: Secondary | ICD-10-CM | POA: Diagnosis not present

## 2022-09-21 DIAGNOSIS — I2510 Atherosclerotic heart disease of native coronary artery without angina pectoris: Secondary | ICD-10-CM | POA: Diagnosis not present

## 2022-09-21 DIAGNOSIS — J449 Chronic obstructive pulmonary disease, unspecified: Secondary | ICD-10-CM | POA: Diagnosis not present

## 2022-09-21 DIAGNOSIS — D696 Thrombocytopenia, unspecified: Secondary | ICD-10-CM | POA: Diagnosis not present

## 2022-09-21 DIAGNOSIS — S72142K Displaced intertrochanteric fracture of left femur, subsequent encounter for closed fracture with nonunion: Secondary | ICD-10-CM | POA: Diagnosis not present

## 2022-09-21 DIAGNOSIS — N1832 Chronic kidney disease, stage 3b: Secondary | ICD-10-CM | POA: Diagnosis not present

## 2022-09-21 DIAGNOSIS — E43 Unspecified severe protein-calorie malnutrition: Secondary | ICD-10-CM | POA: Diagnosis not present

## 2022-09-21 DIAGNOSIS — I129 Hypertensive chronic kidney disease with stage 1 through stage 4 chronic kidney disease, or unspecified chronic kidney disease: Secondary | ICD-10-CM | POA: Diagnosis not present

## 2022-09-21 DIAGNOSIS — E785 Hyperlipidemia, unspecified: Secondary | ICD-10-CM | POA: Diagnosis not present

## 2022-09-21 DIAGNOSIS — G629 Polyneuropathy, unspecified: Secondary | ICD-10-CM | POA: Diagnosis not present

## 2022-09-21 DIAGNOSIS — E8801 Alpha-1-antitrypsin deficiency: Secondary | ICD-10-CM | POA: Diagnosis not present

## 2022-09-26 DIAGNOSIS — D696 Thrombocytopenia, unspecified: Secondary | ICD-10-CM | POA: Diagnosis not present

## 2022-09-26 DIAGNOSIS — H409 Unspecified glaucoma: Secondary | ICD-10-CM | POA: Diagnosis not present

## 2022-09-26 DIAGNOSIS — G629 Polyneuropathy, unspecified: Secondary | ICD-10-CM | POA: Diagnosis not present

## 2022-09-26 DIAGNOSIS — E785 Hyperlipidemia, unspecified: Secondary | ICD-10-CM | POA: Diagnosis not present

## 2022-09-26 DIAGNOSIS — J439 Emphysema, unspecified: Secondary | ICD-10-CM | POA: Diagnosis not present

## 2022-09-26 DIAGNOSIS — S72142K Displaced intertrochanteric fracture of left femur, subsequent encounter for closed fracture with nonunion: Secondary | ICD-10-CM | POA: Diagnosis not present

## 2022-09-26 DIAGNOSIS — E43 Unspecified severe protein-calorie malnutrition: Secondary | ICD-10-CM | POA: Diagnosis not present

## 2022-09-26 DIAGNOSIS — M109 Gout, unspecified: Secondary | ICD-10-CM | POA: Diagnosis not present

## 2022-09-26 DIAGNOSIS — I4510 Unspecified right bundle-branch block: Secondary | ICD-10-CM | POA: Diagnosis not present

## 2022-09-26 DIAGNOSIS — H3530 Unspecified macular degeneration: Secondary | ICD-10-CM | POA: Diagnosis not present

## 2022-09-26 DIAGNOSIS — N1832 Chronic kidney disease, stage 3b: Secondary | ICD-10-CM | POA: Diagnosis not present

## 2022-09-26 DIAGNOSIS — F419 Anxiety disorder, unspecified: Secondary | ICD-10-CM | POA: Diagnosis not present

## 2022-09-26 DIAGNOSIS — E8801 Alpha-1-antitrypsin deficiency: Secondary | ICD-10-CM | POA: Diagnosis not present

## 2022-09-26 DIAGNOSIS — Q791 Other congenital malformations of diaphragm: Secondary | ICD-10-CM | POA: Diagnosis not present

## 2022-09-26 DIAGNOSIS — E039 Hypothyroidism, unspecified: Secondary | ICD-10-CM | POA: Diagnosis not present

## 2022-09-26 DIAGNOSIS — N401 Enlarged prostate with lower urinary tract symptoms: Secondary | ICD-10-CM | POA: Diagnosis not present

## 2022-09-26 DIAGNOSIS — I440 Atrioventricular block, first degree: Secondary | ICD-10-CM | POA: Diagnosis not present

## 2022-09-26 DIAGNOSIS — K219 Gastro-esophageal reflux disease without esophagitis: Secondary | ICD-10-CM | POA: Diagnosis not present

## 2022-09-26 DIAGNOSIS — I129 Hypertensive chronic kidney disease with stage 1 through stage 4 chronic kidney disease, or unspecified chronic kidney disease: Secondary | ICD-10-CM | POA: Diagnosis not present

## 2022-09-26 DIAGNOSIS — J449 Chronic obstructive pulmonary disease, unspecified: Secondary | ICD-10-CM | POA: Diagnosis not present

## 2022-09-26 DIAGNOSIS — G2581 Restless legs syndrome: Secondary | ICD-10-CM | POA: Diagnosis not present

## 2022-09-26 DIAGNOSIS — N529 Male erectile dysfunction, unspecified: Secondary | ICD-10-CM | POA: Diagnosis not present

## 2022-09-26 DIAGNOSIS — R338 Other retention of urine: Secondary | ICD-10-CM | POA: Diagnosis not present

## 2022-09-26 DIAGNOSIS — F32A Depression, unspecified: Secondary | ICD-10-CM | POA: Diagnosis not present

## 2022-09-26 DIAGNOSIS — I2510 Atherosclerotic heart disease of native coronary artery without angina pectoris: Secondary | ICD-10-CM | POA: Diagnosis not present

## 2022-09-28 DIAGNOSIS — R338 Other retention of urine: Secondary | ICD-10-CM | POA: Diagnosis not present

## 2022-09-28 DIAGNOSIS — N401 Enlarged prostate with lower urinary tract symptoms: Secondary | ICD-10-CM | POA: Diagnosis not present

## 2022-09-29 ENCOUNTER — Ambulatory Visit: Payer: Medicare Other | Attending: Cardiovascular Disease | Admitting: Cardiovascular Disease

## 2022-10-02 DIAGNOSIS — F419 Anxiety disorder, unspecified: Secondary | ICD-10-CM | POA: Diagnosis not present

## 2022-10-02 DIAGNOSIS — I2510 Atherosclerotic heart disease of native coronary artery without angina pectoris: Secondary | ICD-10-CM | POA: Diagnosis not present

## 2022-10-02 DIAGNOSIS — E785 Hyperlipidemia, unspecified: Secondary | ICD-10-CM | POA: Diagnosis not present

## 2022-10-02 DIAGNOSIS — J439 Emphysema, unspecified: Secondary | ICD-10-CM | POA: Diagnosis not present

## 2022-10-02 DIAGNOSIS — G629 Polyneuropathy, unspecified: Secondary | ICD-10-CM | POA: Diagnosis not present

## 2022-10-02 DIAGNOSIS — S72142K Displaced intertrochanteric fracture of left femur, subsequent encounter for closed fracture with nonunion: Secondary | ICD-10-CM | POA: Diagnosis not present

## 2022-10-02 DIAGNOSIS — I4510 Unspecified right bundle-branch block: Secondary | ICD-10-CM | POA: Diagnosis not present

## 2022-10-02 DIAGNOSIS — E43 Unspecified severe protein-calorie malnutrition: Secondary | ICD-10-CM | POA: Diagnosis not present

## 2022-10-02 DIAGNOSIS — N1832 Chronic kidney disease, stage 3b: Secondary | ICD-10-CM | POA: Diagnosis not present

## 2022-10-02 DIAGNOSIS — I129 Hypertensive chronic kidney disease with stage 1 through stage 4 chronic kidney disease, or unspecified chronic kidney disease: Secondary | ICD-10-CM | POA: Diagnosis not present

## 2022-10-02 DIAGNOSIS — D696 Thrombocytopenia, unspecified: Secondary | ICD-10-CM | POA: Diagnosis not present

## 2022-10-02 DIAGNOSIS — E8801 Alpha-1-antitrypsin deficiency: Secondary | ICD-10-CM | POA: Diagnosis not present

## 2022-10-02 DIAGNOSIS — H3530 Unspecified macular degeneration: Secondary | ICD-10-CM | POA: Diagnosis not present

## 2022-10-02 DIAGNOSIS — J449 Chronic obstructive pulmonary disease, unspecified: Secondary | ICD-10-CM | POA: Diagnosis not present

## 2022-10-02 DIAGNOSIS — E039 Hypothyroidism, unspecified: Secondary | ICD-10-CM | POA: Diagnosis not present

## 2022-10-02 DIAGNOSIS — F32A Depression, unspecified: Secondary | ICD-10-CM | POA: Diagnosis not present

## 2022-10-08 DIAGNOSIS — G629 Polyneuropathy, unspecified: Secondary | ICD-10-CM | POA: Diagnosis not present

## 2022-10-08 DIAGNOSIS — N1832 Chronic kidney disease, stage 3b: Secondary | ICD-10-CM | POA: Diagnosis not present

## 2022-10-08 DIAGNOSIS — F32A Depression, unspecified: Secondary | ICD-10-CM | POA: Diagnosis not present

## 2022-10-08 DIAGNOSIS — J439 Emphysema, unspecified: Secondary | ICD-10-CM | POA: Diagnosis not present

## 2022-10-08 DIAGNOSIS — D696 Thrombocytopenia, unspecified: Secondary | ICD-10-CM | POA: Diagnosis not present

## 2022-10-08 DIAGNOSIS — F419 Anxiety disorder, unspecified: Secondary | ICD-10-CM | POA: Diagnosis not present

## 2022-10-08 DIAGNOSIS — E785 Hyperlipidemia, unspecified: Secondary | ICD-10-CM | POA: Diagnosis not present

## 2022-10-08 DIAGNOSIS — E039 Hypothyroidism, unspecified: Secondary | ICD-10-CM | POA: Diagnosis not present

## 2022-10-08 DIAGNOSIS — I2510 Atherosclerotic heart disease of native coronary artery without angina pectoris: Secondary | ICD-10-CM | POA: Diagnosis not present

## 2022-10-08 DIAGNOSIS — J449 Chronic obstructive pulmonary disease, unspecified: Secondary | ICD-10-CM | POA: Diagnosis not present

## 2022-10-08 DIAGNOSIS — E43 Unspecified severe protein-calorie malnutrition: Secondary | ICD-10-CM | POA: Diagnosis not present

## 2022-10-08 DIAGNOSIS — I4510 Unspecified right bundle-branch block: Secondary | ICD-10-CM | POA: Diagnosis not present

## 2022-10-08 DIAGNOSIS — H3530 Unspecified macular degeneration: Secondary | ICD-10-CM | POA: Diagnosis not present

## 2022-10-08 DIAGNOSIS — E8801 Alpha-1-antitrypsin deficiency: Secondary | ICD-10-CM | POA: Diagnosis not present

## 2022-10-08 DIAGNOSIS — I129 Hypertensive chronic kidney disease with stage 1 through stage 4 chronic kidney disease, or unspecified chronic kidney disease: Secondary | ICD-10-CM | POA: Diagnosis not present

## 2022-10-08 DIAGNOSIS — S72142K Displaced intertrochanteric fracture of left femur, subsequent encounter for closed fracture with nonunion: Secondary | ICD-10-CM | POA: Diagnosis not present

## 2022-10-16 DIAGNOSIS — I2510 Atherosclerotic heart disease of native coronary artery without angina pectoris: Secondary | ICD-10-CM | POA: Diagnosis not present

## 2022-10-16 DIAGNOSIS — F32A Depression, unspecified: Secondary | ICD-10-CM | POA: Diagnosis not present

## 2022-10-16 DIAGNOSIS — F419 Anxiety disorder, unspecified: Secondary | ICD-10-CM | POA: Diagnosis not present

## 2022-10-16 DIAGNOSIS — E43 Unspecified severe protein-calorie malnutrition: Secondary | ICD-10-CM | POA: Diagnosis not present

## 2022-10-16 DIAGNOSIS — H3530 Unspecified macular degeneration: Secondary | ICD-10-CM | POA: Diagnosis not present

## 2022-10-16 DIAGNOSIS — E785 Hyperlipidemia, unspecified: Secondary | ICD-10-CM | POA: Diagnosis not present

## 2022-10-16 DIAGNOSIS — E8801 Alpha-1-antitrypsin deficiency: Secondary | ICD-10-CM | POA: Diagnosis not present

## 2022-10-16 DIAGNOSIS — E039 Hypothyroidism, unspecified: Secondary | ICD-10-CM | POA: Diagnosis not present

## 2022-10-16 DIAGNOSIS — G629 Polyneuropathy, unspecified: Secondary | ICD-10-CM | POA: Diagnosis not present

## 2022-10-16 DIAGNOSIS — S72142K Displaced intertrochanteric fracture of left femur, subsequent encounter for closed fracture with nonunion: Secondary | ICD-10-CM | POA: Diagnosis not present

## 2022-10-16 DIAGNOSIS — I4510 Unspecified right bundle-branch block: Secondary | ICD-10-CM | POA: Diagnosis not present

## 2022-10-16 DIAGNOSIS — J449 Chronic obstructive pulmonary disease, unspecified: Secondary | ICD-10-CM | POA: Diagnosis not present

## 2022-10-16 DIAGNOSIS — D696 Thrombocytopenia, unspecified: Secondary | ICD-10-CM | POA: Diagnosis not present

## 2022-10-16 DIAGNOSIS — J439 Emphysema, unspecified: Secondary | ICD-10-CM | POA: Diagnosis not present

## 2022-10-16 DIAGNOSIS — N1832 Chronic kidney disease, stage 3b: Secondary | ICD-10-CM | POA: Diagnosis not present

## 2022-10-16 DIAGNOSIS — I129 Hypertensive chronic kidney disease with stage 1 through stage 4 chronic kidney disease, or unspecified chronic kidney disease: Secondary | ICD-10-CM | POA: Diagnosis not present

## 2022-10-18 DIAGNOSIS — E039 Hypothyroidism, unspecified: Secondary | ICD-10-CM | POA: Diagnosis not present

## 2022-10-18 DIAGNOSIS — F419 Anxiety disorder, unspecified: Secondary | ICD-10-CM | POA: Diagnosis not present

## 2022-10-18 DIAGNOSIS — J449 Chronic obstructive pulmonary disease, unspecified: Secondary | ICD-10-CM | POA: Diagnosis not present

## 2022-10-18 DIAGNOSIS — E785 Hyperlipidemia, unspecified: Secondary | ICD-10-CM | POA: Diagnosis not present

## 2022-10-18 DIAGNOSIS — J439 Emphysema, unspecified: Secondary | ICD-10-CM | POA: Diagnosis not present

## 2022-10-18 DIAGNOSIS — N1832 Chronic kidney disease, stage 3b: Secondary | ICD-10-CM | POA: Diagnosis not present

## 2022-10-18 DIAGNOSIS — S72142K Displaced intertrochanteric fracture of left femur, subsequent encounter for closed fracture with nonunion: Secondary | ICD-10-CM | POA: Diagnosis not present

## 2022-10-18 DIAGNOSIS — I129 Hypertensive chronic kidney disease with stage 1 through stage 4 chronic kidney disease, or unspecified chronic kidney disease: Secondary | ICD-10-CM | POA: Diagnosis not present

## 2022-10-18 DIAGNOSIS — I4510 Unspecified right bundle-branch block: Secondary | ICD-10-CM | POA: Diagnosis not present

## 2022-10-18 DIAGNOSIS — D696 Thrombocytopenia, unspecified: Secondary | ICD-10-CM | POA: Diagnosis not present

## 2022-10-18 DIAGNOSIS — E8801 Alpha-1-antitrypsin deficiency: Secondary | ICD-10-CM | POA: Diagnosis not present

## 2022-10-18 DIAGNOSIS — I2510 Atherosclerotic heart disease of native coronary artery without angina pectoris: Secondary | ICD-10-CM | POA: Diagnosis not present

## 2022-10-18 DIAGNOSIS — F32A Depression, unspecified: Secondary | ICD-10-CM | POA: Diagnosis not present

## 2022-10-18 DIAGNOSIS — E43 Unspecified severe protein-calorie malnutrition: Secondary | ICD-10-CM | POA: Diagnosis not present

## 2022-10-18 DIAGNOSIS — G629 Polyneuropathy, unspecified: Secondary | ICD-10-CM | POA: Diagnosis not present

## 2022-10-18 DIAGNOSIS — H3530 Unspecified macular degeneration: Secondary | ICD-10-CM | POA: Diagnosis not present

## 2022-10-19 DIAGNOSIS — S72142D Displaced intertrochanteric fracture of left femur, subsequent encounter for closed fracture with routine healing: Secondary | ICD-10-CM | POA: Diagnosis not present

## 2022-10-25 DIAGNOSIS — J449 Chronic obstructive pulmonary disease, unspecified: Secondary | ICD-10-CM | POA: Diagnosis not present

## 2022-10-25 DIAGNOSIS — E43 Unspecified severe protein-calorie malnutrition: Secondary | ICD-10-CM | POA: Diagnosis not present

## 2022-10-25 DIAGNOSIS — E8801 Alpha-1-antitrypsin deficiency: Secondary | ICD-10-CM | POA: Diagnosis not present

## 2022-10-25 DIAGNOSIS — J439 Emphysema, unspecified: Secondary | ICD-10-CM | POA: Diagnosis not present

## 2022-10-25 DIAGNOSIS — D696 Thrombocytopenia, unspecified: Secondary | ICD-10-CM | POA: Diagnosis not present

## 2022-10-25 DIAGNOSIS — N1832 Chronic kidney disease, stage 3b: Secondary | ICD-10-CM | POA: Diagnosis not present

## 2022-10-25 DIAGNOSIS — F419 Anxiety disorder, unspecified: Secondary | ICD-10-CM | POA: Diagnosis not present

## 2022-10-25 DIAGNOSIS — E785 Hyperlipidemia, unspecified: Secondary | ICD-10-CM | POA: Diagnosis not present

## 2022-10-25 DIAGNOSIS — G629 Polyneuropathy, unspecified: Secondary | ICD-10-CM | POA: Diagnosis not present

## 2022-10-25 DIAGNOSIS — I2510 Atherosclerotic heart disease of native coronary artery without angina pectoris: Secondary | ICD-10-CM | POA: Diagnosis not present

## 2022-10-25 DIAGNOSIS — E039 Hypothyroidism, unspecified: Secondary | ICD-10-CM | POA: Diagnosis not present

## 2022-10-25 DIAGNOSIS — I129 Hypertensive chronic kidney disease with stage 1 through stage 4 chronic kidney disease, or unspecified chronic kidney disease: Secondary | ICD-10-CM | POA: Diagnosis not present

## 2022-10-25 DIAGNOSIS — I4510 Unspecified right bundle-branch block: Secondary | ICD-10-CM | POA: Diagnosis not present

## 2022-10-25 DIAGNOSIS — H3530 Unspecified macular degeneration: Secondary | ICD-10-CM | POA: Diagnosis not present

## 2022-10-25 DIAGNOSIS — S72142K Displaced intertrochanteric fracture of left femur, subsequent encounter for closed fracture with nonunion: Secondary | ICD-10-CM | POA: Diagnosis not present

## 2022-10-25 DIAGNOSIS — F32A Depression, unspecified: Secondary | ICD-10-CM | POA: Diagnosis not present

## 2022-10-26 DIAGNOSIS — G629 Polyneuropathy, unspecified: Secondary | ICD-10-CM | POA: Diagnosis not present

## 2022-10-26 DIAGNOSIS — R338 Other retention of urine: Secondary | ICD-10-CM | POA: Diagnosis not present

## 2022-10-26 DIAGNOSIS — I4510 Unspecified right bundle-branch block: Secondary | ICD-10-CM | POA: Diagnosis not present

## 2022-10-26 DIAGNOSIS — N529 Male erectile dysfunction, unspecified: Secondary | ICD-10-CM | POA: Diagnosis not present

## 2022-10-26 DIAGNOSIS — J439 Emphysema, unspecified: Secondary | ICD-10-CM | POA: Diagnosis not present

## 2022-10-26 DIAGNOSIS — E8801 Alpha-1-antitrypsin deficiency: Secondary | ICD-10-CM | POA: Diagnosis not present

## 2022-10-26 DIAGNOSIS — S72142K Displaced intertrochanteric fracture of left femur, subsequent encounter for closed fracture with nonunion: Secondary | ICD-10-CM | POA: Diagnosis not present

## 2022-10-26 DIAGNOSIS — M109 Gout, unspecified: Secondary | ICD-10-CM | POA: Diagnosis not present

## 2022-10-26 DIAGNOSIS — F419 Anxiety disorder, unspecified: Secondary | ICD-10-CM | POA: Diagnosis not present

## 2022-10-26 DIAGNOSIS — Q791 Other congenital malformations of diaphragm: Secondary | ICD-10-CM | POA: Diagnosis not present

## 2022-10-26 DIAGNOSIS — I129 Hypertensive chronic kidney disease with stage 1 through stage 4 chronic kidney disease, or unspecified chronic kidney disease: Secondary | ICD-10-CM | POA: Diagnosis not present

## 2022-10-26 DIAGNOSIS — E039 Hypothyroidism, unspecified: Secondary | ICD-10-CM | POA: Diagnosis not present

## 2022-10-26 DIAGNOSIS — F32A Depression, unspecified: Secondary | ICD-10-CM | POA: Diagnosis not present

## 2022-10-26 DIAGNOSIS — H409 Unspecified glaucoma: Secondary | ICD-10-CM | POA: Diagnosis not present

## 2022-10-26 DIAGNOSIS — I2510 Atherosclerotic heart disease of native coronary artery without angina pectoris: Secondary | ICD-10-CM | POA: Diagnosis not present

## 2022-10-26 DIAGNOSIS — N1832 Chronic kidney disease, stage 3b: Secondary | ICD-10-CM | POA: Diagnosis not present

## 2022-10-26 DIAGNOSIS — I440 Atrioventricular block, first degree: Secondary | ICD-10-CM | POA: Diagnosis not present

## 2022-10-26 DIAGNOSIS — E785 Hyperlipidemia, unspecified: Secondary | ICD-10-CM | POA: Diagnosis not present

## 2022-10-26 DIAGNOSIS — J449 Chronic obstructive pulmonary disease, unspecified: Secondary | ICD-10-CM | POA: Diagnosis not present

## 2022-10-26 DIAGNOSIS — D696 Thrombocytopenia, unspecified: Secondary | ICD-10-CM | POA: Diagnosis not present

## 2022-10-26 DIAGNOSIS — G2581 Restless legs syndrome: Secondary | ICD-10-CM | POA: Diagnosis not present

## 2022-10-26 DIAGNOSIS — E43 Unspecified severe protein-calorie malnutrition: Secondary | ICD-10-CM | POA: Diagnosis not present

## 2022-10-26 DIAGNOSIS — N401 Enlarged prostate with lower urinary tract symptoms: Secondary | ICD-10-CM | POA: Diagnosis not present

## 2022-10-26 DIAGNOSIS — K219 Gastro-esophageal reflux disease without esophagitis: Secondary | ICD-10-CM | POA: Diagnosis not present

## 2022-10-26 DIAGNOSIS — H3530 Unspecified macular degeneration: Secondary | ICD-10-CM | POA: Diagnosis not present

## 2022-11-01 DIAGNOSIS — E43 Unspecified severe protein-calorie malnutrition: Secondary | ICD-10-CM | POA: Diagnosis not present

## 2022-11-01 DIAGNOSIS — N1832 Chronic kidney disease, stage 3b: Secondary | ICD-10-CM | POA: Diagnosis not present

## 2022-11-01 DIAGNOSIS — I4510 Unspecified right bundle-branch block: Secondary | ICD-10-CM | POA: Diagnosis not present

## 2022-11-01 DIAGNOSIS — F32A Depression, unspecified: Secondary | ICD-10-CM | POA: Diagnosis not present

## 2022-11-01 DIAGNOSIS — F419 Anxiety disorder, unspecified: Secondary | ICD-10-CM | POA: Diagnosis not present

## 2022-11-01 DIAGNOSIS — G629 Polyneuropathy, unspecified: Secondary | ICD-10-CM | POA: Diagnosis not present

## 2022-11-01 DIAGNOSIS — J439 Emphysema, unspecified: Secondary | ICD-10-CM | POA: Diagnosis not present

## 2022-11-01 DIAGNOSIS — E785 Hyperlipidemia, unspecified: Secondary | ICD-10-CM | POA: Diagnosis not present

## 2022-11-01 DIAGNOSIS — E039 Hypothyroidism, unspecified: Secondary | ICD-10-CM | POA: Diagnosis not present

## 2022-11-01 DIAGNOSIS — J449 Chronic obstructive pulmonary disease, unspecified: Secondary | ICD-10-CM | POA: Diagnosis not present

## 2022-11-01 DIAGNOSIS — I2510 Atherosclerotic heart disease of native coronary artery without angina pectoris: Secondary | ICD-10-CM | POA: Diagnosis not present

## 2022-11-01 DIAGNOSIS — D696 Thrombocytopenia, unspecified: Secondary | ICD-10-CM | POA: Diagnosis not present

## 2022-11-01 DIAGNOSIS — E8801 Alpha-1-antitrypsin deficiency: Secondary | ICD-10-CM | POA: Diagnosis not present

## 2022-11-01 DIAGNOSIS — I129 Hypertensive chronic kidney disease with stage 1 through stage 4 chronic kidney disease, or unspecified chronic kidney disease: Secondary | ICD-10-CM | POA: Diagnosis not present

## 2022-11-01 DIAGNOSIS — H3530 Unspecified macular degeneration: Secondary | ICD-10-CM | POA: Diagnosis not present

## 2022-11-01 DIAGNOSIS — S72142K Displaced intertrochanteric fracture of left femur, subsequent encounter for closed fracture with nonunion: Secondary | ICD-10-CM | POA: Diagnosis not present

## 2022-11-08 DIAGNOSIS — J439 Emphysema, unspecified: Secondary | ICD-10-CM | POA: Diagnosis not present

## 2022-11-08 DIAGNOSIS — I2510 Atherosclerotic heart disease of native coronary artery without angina pectoris: Secondary | ICD-10-CM | POA: Diagnosis not present

## 2022-11-08 DIAGNOSIS — I129 Hypertensive chronic kidney disease with stage 1 through stage 4 chronic kidney disease, or unspecified chronic kidney disease: Secondary | ICD-10-CM | POA: Diagnosis not present

## 2022-11-08 DIAGNOSIS — G629 Polyneuropathy, unspecified: Secondary | ICD-10-CM | POA: Diagnosis not present

## 2022-11-08 DIAGNOSIS — I4510 Unspecified right bundle-branch block: Secondary | ICD-10-CM | POA: Diagnosis not present

## 2022-11-08 DIAGNOSIS — E8801 Alpha-1-antitrypsin deficiency: Secondary | ICD-10-CM | POA: Diagnosis not present

## 2022-11-08 DIAGNOSIS — F32A Depression, unspecified: Secondary | ICD-10-CM | POA: Diagnosis not present

## 2022-11-08 DIAGNOSIS — F419 Anxiety disorder, unspecified: Secondary | ICD-10-CM | POA: Diagnosis not present

## 2022-11-08 DIAGNOSIS — J449 Chronic obstructive pulmonary disease, unspecified: Secondary | ICD-10-CM | POA: Diagnosis not present

## 2022-11-08 DIAGNOSIS — N1832 Chronic kidney disease, stage 3b: Secondary | ICD-10-CM | POA: Diagnosis not present

## 2022-11-08 DIAGNOSIS — D696 Thrombocytopenia, unspecified: Secondary | ICD-10-CM | POA: Diagnosis not present

## 2022-11-08 DIAGNOSIS — E039 Hypothyroidism, unspecified: Secondary | ICD-10-CM | POA: Diagnosis not present

## 2022-11-08 DIAGNOSIS — S72142K Displaced intertrochanteric fracture of left femur, subsequent encounter for closed fracture with nonunion: Secondary | ICD-10-CM | POA: Diagnosis not present

## 2022-11-08 DIAGNOSIS — E43 Unspecified severe protein-calorie malnutrition: Secondary | ICD-10-CM | POA: Diagnosis not present

## 2022-11-08 DIAGNOSIS — H3530 Unspecified macular degeneration: Secondary | ICD-10-CM | POA: Diagnosis not present

## 2022-11-08 DIAGNOSIS — E785 Hyperlipidemia, unspecified: Secondary | ICD-10-CM | POA: Diagnosis not present

## 2022-11-15 DIAGNOSIS — H3530 Unspecified macular degeneration: Secondary | ICD-10-CM | POA: Diagnosis not present

## 2022-11-15 DIAGNOSIS — G629 Polyneuropathy, unspecified: Secondary | ICD-10-CM | POA: Diagnosis not present

## 2022-11-15 DIAGNOSIS — J449 Chronic obstructive pulmonary disease, unspecified: Secondary | ICD-10-CM | POA: Diagnosis not present

## 2022-11-15 DIAGNOSIS — S72142K Displaced intertrochanteric fracture of left femur, subsequent encounter for closed fracture with nonunion: Secondary | ICD-10-CM | POA: Diagnosis not present

## 2022-11-15 DIAGNOSIS — E039 Hypothyroidism, unspecified: Secondary | ICD-10-CM | POA: Diagnosis not present

## 2022-11-15 DIAGNOSIS — I4510 Unspecified right bundle-branch block: Secondary | ICD-10-CM | POA: Diagnosis not present

## 2022-11-15 DIAGNOSIS — E785 Hyperlipidemia, unspecified: Secondary | ICD-10-CM | POA: Diagnosis not present

## 2022-11-15 DIAGNOSIS — I2510 Atherosclerotic heart disease of native coronary artery without angina pectoris: Secondary | ICD-10-CM | POA: Diagnosis not present

## 2022-11-15 DIAGNOSIS — F32A Depression, unspecified: Secondary | ICD-10-CM | POA: Diagnosis not present

## 2022-11-15 DIAGNOSIS — I129 Hypertensive chronic kidney disease with stage 1 through stage 4 chronic kidney disease, or unspecified chronic kidney disease: Secondary | ICD-10-CM | POA: Diagnosis not present

## 2022-11-15 DIAGNOSIS — F419 Anxiety disorder, unspecified: Secondary | ICD-10-CM | POA: Diagnosis not present

## 2022-11-15 DIAGNOSIS — D696 Thrombocytopenia, unspecified: Secondary | ICD-10-CM | POA: Diagnosis not present

## 2022-11-15 DIAGNOSIS — E43 Unspecified severe protein-calorie malnutrition: Secondary | ICD-10-CM | POA: Diagnosis not present

## 2022-11-15 DIAGNOSIS — J439 Emphysema, unspecified: Secondary | ICD-10-CM | POA: Diagnosis not present

## 2022-11-15 DIAGNOSIS — N1832 Chronic kidney disease, stage 3b: Secondary | ICD-10-CM | POA: Diagnosis not present

## 2022-11-15 DIAGNOSIS — E8801 Alpha-1-antitrypsin deficiency: Secondary | ICD-10-CM | POA: Diagnosis not present

## 2022-11-24 DIAGNOSIS — R338 Other retention of urine: Secondary | ICD-10-CM | POA: Diagnosis not present

## 2022-11-25 DIAGNOSIS — E785 Hyperlipidemia, unspecified: Secondary | ICD-10-CM | POA: Diagnosis not present

## 2022-11-25 DIAGNOSIS — G2581 Restless legs syndrome: Secondary | ICD-10-CM | POA: Diagnosis not present

## 2022-11-25 DIAGNOSIS — J439 Emphysema, unspecified: Secondary | ICD-10-CM | POA: Diagnosis not present

## 2022-11-25 DIAGNOSIS — N1832 Chronic kidney disease, stage 3b: Secondary | ICD-10-CM | POA: Diagnosis not present

## 2022-11-25 DIAGNOSIS — I129 Hypertensive chronic kidney disease with stage 1 through stage 4 chronic kidney disease, or unspecified chronic kidney disease: Secondary | ICD-10-CM | POA: Diagnosis not present

## 2022-11-25 DIAGNOSIS — F419 Anxiety disorder, unspecified: Secondary | ICD-10-CM | POA: Diagnosis not present

## 2022-11-25 DIAGNOSIS — N401 Enlarged prostate with lower urinary tract symptoms: Secondary | ICD-10-CM | POA: Diagnosis not present

## 2022-11-25 DIAGNOSIS — G629 Polyneuropathy, unspecified: Secondary | ICD-10-CM | POA: Diagnosis not present

## 2022-11-25 DIAGNOSIS — I4510 Unspecified right bundle-branch block: Secondary | ICD-10-CM | POA: Diagnosis not present

## 2022-11-25 DIAGNOSIS — N529 Male erectile dysfunction, unspecified: Secondary | ICD-10-CM | POA: Diagnosis not present

## 2022-11-25 DIAGNOSIS — E8801 Alpha-1-antitrypsin deficiency: Secondary | ICD-10-CM | POA: Diagnosis not present

## 2022-11-25 DIAGNOSIS — J449 Chronic obstructive pulmonary disease, unspecified: Secondary | ICD-10-CM | POA: Diagnosis not present

## 2022-11-25 DIAGNOSIS — I2510 Atherosclerotic heart disease of native coronary artery without angina pectoris: Secondary | ICD-10-CM | POA: Diagnosis not present

## 2022-11-25 DIAGNOSIS — H3530 Unspecified macular degeneration: Secondary | ICD-10-CM | POA: Diagnosis not present

## 2022-11-25 DIAGNOSIS — F32A Depression, unspecified: Secondary | ICD-10-CM | POA: Diagnosis not present

## 2022-11-25 DIAGNOSIS — Q791 Other congenital malformations of diaphragm: Secondary | ICD-10-CM | POA: Diagnosis not present

## 2022-11-25 DIAGNOSIS — H409 Unspecified glaucoma: Secondary | ICD-10-CM | POA: Diagnosis not present

## 2022-11-25 DIAGNOSIS — D696 Thrombocytopenia, unspecified: Secondary | ICD-10-CM | POA: Diagnosis not present

## 2022-11-25 DIAGNOSIS — R338 Other retention of urine: Secondary | ICD-10-CM | POA: Diagnosis not present

## 2022-11-25 DIAGNOSIS — E43 Unspecified severe protein-calorie malnutrition: Secondary | ICD-10-CM | POA: Diagnosis not present

## 2022-11-25 DIAGNOSIS — M109 Gout, unspecified: Secondary | ICD-10-CM | POA: Diagnosis not present

## 2022-11-25 DIAGNOSIS — S72142K Displaced intertrochanteric fracture of left femur, subsequent encounter for closed fracture with nonunion: Secondary | ICD-10-CM | POA: Diagnosis not present

## 2022-11-25 DIAGNOSIS — K219 Gastro-esophageal reflux disease without esophagitis: Secondary | ICD-10-CM | POA: Diagnosis not present

## 2022-11-25 DIAGNOSIS — E039 Hypothyroidism, unspecified: Secondary | ICD-10-CM | POA: Diagnosis not present

## 2022-11-25 DIAGNOSIS — I440 Atrioventricular block, first degree: Secondary | ICD-10-CM | POA: Diagnosis not present

## 2022-11-29 DIAGNOSIS — N1832 Chronic kidney disease, stage 3b: Secondary | ICD-10-CM | POA: Diagnosis not present

## 2022-11-29 DIAGNOSIS — H3530 Unspecified macular degeneration: Secondary | ICD-10-CM | POA: Diagnosis not present

## 2022-11-29 DIAGNOSIS — D696 Thrombocytopenia, unspecified: Secondary | ICD-10-CM | POA: Diagnosis not present

## 2022-11-29 DIAGNOSIS — I2510 Atherosclerotic heart disease of native coronary artery without angina pectoris: Secondary | ICD-10-CM | POA: Diagnosis not present

## 2022-11-29 DIAGNOSIS — I129 Hypertensive chronic kidney disease with stage 1 through stage 4 chronic kidney disease, or unspecified chronic kidney disease: Secondary | ICD-10-CM | POA: Diagnosis not present

## 2022-11-29 DIAGNOSIS — F419 Anxiety disorder, unspecified: Secondary | ICD-10-CM | POA: Diagnosis not present

## 2022-11-29 DIAGNOSIS — E43 Unspecified severe protein-calorie malnutrition: Secondary | ICD-10-CM | POA: Diagnosis not present

## 2022-11-29 DIAGNOSIS — F32A Depression, unspecified: Secondary | ICD-10-CM | POA: Diagnosis not present

## 2022-11-29 DIAGNOSIS — J449 Chronic obstructive pulmonary disease, unspecified: Secondary | ICD-10-CM | POA: Diagnosis not present

## 2022-11-29 DIAGNOSIS — S72142K Displaced intertrochanteric fracture of left femur, subsequent encounter for closed fracture with nonunion: Secondary | ICD-10-CM | POA: Diagnosis not present

## 2022-11-29 DIAGNOSIS — E039 Hypothyroidism, unspecified: Secondary | ICD-10-CM | POA: Diagnosis not present

## 2022-11-29 DIAGNOSIS — G629 Polyneuropathy, unspecified: Secondary | ICD-10-CM | POA: Diagnosis not present

## 2022-11-29 DIAGNOSIS — I4510 Unspecified right bundle-branch block: Secondary | ICD-10-CM | POA: Diagnosis not present

## 2022-11-29 DIAGNOSIS — E785 Hyperlipidemia, unspecified: Secondary | ICD-10-CM | POA: Diagnosis not present

## 2022-11-29 DIAGNOSIS — E8801 Alpha-1-antitrypsin deficiency: Secondary | ICD-10-CM | POA: Diagnosis not present

## 2022-11-29 DIAGNOSIS — J439 Emphysema, unspecified: Secondary | ICD-10-CM | POA: Diagnosis not present

## 2022-12-05 DIAGNOSIS — S72142D Displaced intertrochanteric fracture of left femur, subsequent encounter for closed fracture with routine healing: Secondary | ICD-10-CM | POA: Diagnosis not present

## 2022-12-06 DIAGNOSIS — E8801 Alpha-1-antitrypsin deficiency: Secondary | ICD-10-CM | POA: Diagnosis not present

## 2022-12-06 DIAGNOSIS — D696 Thrombocytopenia, unspecified: Secondary | ICD-10-CM | POA: Diagnosis not present

## 2022-12-06 DIAGNOSIS — F32A Depression, unspecified: Secondary | ICD-10-CM | POA: Diagnosis not present

## 2022-12-06 DIAGNOSIS — I2510 Atherosclerotic heart disease of native coronary artery without angina pectoris: Secondary | ICD-10-CM | POA: Diagnosis not present

## 2022-12-06 DIAGNOSIS — F419 Anxiety disorder, unspecified: Secondary | ICD-10-CM | POA: Diagnosis not present

## 2022-12-06 DIAGNOSIS — I129 Hypertensive chronic kidney disease with stage 1 through stage 4 chronic kidney disease, or unspecified chronic kidney disease: Secondary | ICD-10-CM | POA: Diagnosis not present

## 2022-12-06 DIAGNOSIS — S72142K Displaced intertrochanteric fracture of left femur, subsequent encounter for closed fracture with nonunion: Secondary | ICD-10-CM | POA: Diagnosis not present

## 2022-12-06 DIAGNOSIS — I4510 Unspecified right bundle-branch block: Secondary | ICD-10-CM | POA: Diagnosis not present

## 2022-12-06 DIAGNOSIS — J449 Chronic obstructive pulmonary disease, unspecified: Secondary | ICD-10-CM | POA: Diagnosis not present

## 2022-12-06 DIAGNOSIS — J439 Emphysema, unspecified: Secondary | ICD-10-CM | POA: Diagnosis not present

## 2022-12-06 DIAGNOSIS — N1832 Chronic kidney disease, stage 3b: Secondary | ICD-10-CM | POA: Diagnosis not present

## 2022-12-06 DIAGNOSIS — E785 Hyperlipidemia, unspecified: Secondary | ICD-10-CM | POA: Diagnosis not present

## 2022-12-06 DIAGNOSIS — H3530 Unspecified macular degeneration: Secondary | ICD-10-CM | POA: Diagnosis not present

## 2022-12-06 DIAGNOSIS — E039 Hypothyroidism, unspecified: Secondary | ICD-10-CM | POA: Diagnosis not present

## 2022-12-06 DIAGNOSIS — G629 Polyneuropathy, unspecified: Secondary | ICD-10-CM | POA: Diagnosis not present

## 2022-12-06 DIAGNOSIS — E43 Unspecified severe protein-calorie malnutrition: Secondary | ICD-10-CM | POA: Diagnosis not present

## 2022-12-07 DIAGNOSIS — H43813 Vitreous degeneration, bilateral: Secondary | ICD-10-CM | POA: Diagnosis not present

## 2022-12-07 DIAGNOSIS — H353134 Nonexudative age-related macular degeneration, bilateral, advanced atrophic with subfoveal involvement: Secondary | ICD-10-CM | POA: Diagnosis not present

## 2022-12-07 DIAGNOSIS — H43822 Vitreomacular adhesion, left eye: Secondary | ICD-10-CM | POA: Diagnosis not present

## 2022-12-07 DIAGNOSIS — H353221 Exudative age-related macular degeneration, left eye, with active choroidal neovascularization: Secondary | ICD-10-CM | POA: Diagnosis not present

## 2022-12-07 DIAGNOSIS — H35371 Puckering of macula, right eye: Secondary | ICD-10-CM | POA: Diagnosis not present

## 2022-12-11 DIAGNOSIS — K080 Exfoliation of teeth due to systemic causes: Secondary | ICD-10-CM | POA: Diagnosis not present

## 2022-12-13 DIAGNOSIS — J439 Emphysema, unspecified: Secondary | ICD-10-CM | POA: Diagnosis not present

## 2022-12-13 DIAGNOSIS — F419 Anxiety disorder, unspecified: Secondary | ICD-10-CM | POA: Diagnosis not present

## 2022-12-13 DIAGNOSIS — H3530 Unspecified macular degeneration: Secondary | ICD-10-CM | POA: Diagnosis not present

## 2022-12-13 DIAGNOSIS — I129 Hypertensive chronic kidney disease with stage 1 through stage 4 chronic kidney disease, or unspecified chronic kidney disease: Secondary | ICD-10-CM | POA: Diagnosis not present

## 2022-12-13 DIAGNOSIS — S72142K Displaced intertrochanteric fracture of left femur, subsequent encounter for closed fracture with nonunion: Secondary | ICD-10-CM | POA: Diagnosis not present

## 2022-12-13 DIAGNOSIS — E785 Hyperlipidemia, unspecified: Secondary | ICD-10-CM | POA: Diagnosis not present

## 2022-12-13 DIAGNOSIS — D696 Thrombocytopenia, unspecified: Secondary | ICD-10-CM | POA: Diagnosis not present

## 2022-12-13 DIAGNOSIS — E43 Unspecified severe protein-calorie malnutrition: Secondary | ICD-10-CM | POA: Diagnosis not present

## 2022-12-13 DIAGNOSIS — E039 Hypothyroidism, unspecified: Secondary | ICD-10-CM | POA: Diagnosis not present

## 2022-12-13 DIAGNOSIS — J449 Chronic obstructive pulmonary disease, unspecified: Secondary | ICD-10-CM | POA: Diagnosis not present

## 2022-12-13 DIAGNOSIS — I2510 Atherosclerotic heart disease of native coronary artery without angina pectoris: Secondary | ICD-10-CM | POA: Diagnosis not present

## 2022-12-13 DIAGNOSIS — I4510 Unspecified right bundle-branch block: Secondary | ICD-10-CM | POA: Diagnosis not present

## 2022-12-13 DIAGNOSIS — F32A Depression, unspecified: Secondary | ICD-10-CM | POA: Diagnosis not present

## 2022-12-13 DIAGNOSIS — N1832 Chronic kidney disease, stage 3b: Secondary | ICD-10-CM | POA: Diagnosis not present

## 2022-12-13 DIAGNOSIS — E8801 Alpha-1-antitrypsin deficiency: Secondary | ICD-10-CM | POA: Diagnosis not present

## 2022-12-13 DIAGNOSIS — G629 Polyneuropathy, unspecified: Secondary | ICD-10-CM | POA: Diagnosis not present

## 2022-12-22 DIAGNOSIS — N1832 Chronic kidney disease, stage 3b: Secondary | ICD-10-CM | POA: Diagnosis not present

## 2022-12-22 DIAGNOSIS — H3530 Unspecified macular degeneration: Secondary | ICD-10-CM | POA: Diagnosis not present

## 2022-12-22 DIAGNOSIS — S72142K Displaced intertrochanteric fracture of left femur, subsequent encounter for closed fracture with nonunion: Secondary | ICD-10-CM | POA: Diagnosis not present

## 2022-12-22 DIAGNOSIS — E8801 Alpha-1-antitrypsin deficiency: Secondary | ICD-10-CM | POA: Diagnosis not present

## 2022-12-22 DIAGNOSIS — E039 Hypothyroidism, unspecified: Secondary | ICD-10-CM | POA: Diagnosis not present

## 2022-12-22 DIAGNOSIS — E43 Unspecified severe protein-calorie malnutrition: Secondary | ICD-10-CM | POA: Diagnosis not present

## 2022-12-22 DIAGNOSIS — F419 Anxiety disorder, unspecified: Secondary | ICD-10-CM | POA: Diagnosis not present

## 2022-12-22 DIAGNOSIS — F32A Depression, unspecified: Secondary | ICD-10-CM | POA: Diagnosis not present

## 2022-12-22 DIAGNOSIS — J439 Emphysema, unspecified: Secondary | ICD-10-CM | POA: Diagnosis not present

## 2022-12-22 DIAGNOSIS — E785 Hyperlipidemia, unspecified: Secondary | ICD-10-CM | POA: Diagnosis not present

## 2022-12-22 DIAGNOSIS — I4510 Unspecified right bundle-branch block: Secondary | ICD-10-CM | POA: Diagnosis not present

## 2022-12-22 DIAGNOSIS — I129 Hypertensive chronic kidney disease with stage 1 through stage 4 chronic kidney disease, or unspecified chronic kidney disease: Secondary | ICD-10-CM | POA: Diagnosis not present

## 2022-12-22 DIAGNOSIS — G629 Polyneuropathy, unspecified: Secondary | ICD-10-CM | POA: Diagnosis not present

## 2022-12-22 DIAGNOSIS — J449 Chronic obstructive pulmonary disease, unspecified: Secondary | ICD-10-CM | POA: Diagnosis not present

## 2022-12-22 DIAGNOSIS — I2510 Atherosclerotic heart disease of native coronary artery without angina pectoris: Secondary | ICD-10-CM | POA: Diagnosis not present

## 2022-12-22 DIAGNOSIS — D696 Thrombocytopenia, unspecified: Secondary | ICD-10-CM | POA: Diagnosis not present

## 2022-12-25 DIAGNOSIS — E8801 Alpha-1-antitrypsin deficiency: Secondary | ICD-10-CM | POA: Diagnosis not present

## 2022-12-25 DIAGNOSIS — Q791 Other congenital malformations of diaphragm: Secondary | ICD-10-CM | POA: Diagnosis not present

## 2022-12-25 DIAGNOSIS — I440 Atrioventricular block, first degree: Secondary | ICD-10-CM | POA: Diagnosis not present

## 2022-12-25 DIAGNOSIS — R338 Other retention of urine: Secondary | ICD-10-CM | POA: Diagnosis not present

## 2022-12-25 DIAGNOSIS — I129 Hypertensive chronic kidney disease with stage 1 through stage 4 chronic kidney disease, or unspecified chronic kidney disease: Secondary | ICD-10-CM | POA: Diagnosis not present

## 2022-12-25 DIAGNOSIS — N1832 Chronic kidney disease, stage 3b: Secondary | ICD-10-CM | POA: Diagnosis not present

## 2022-12-25 DIAGNOSIS — H409 Unspecified glaucoma: Secondary | ICD-10-CM | POA: Diagnosis not present

## 2022-12-25 DIAGNOSIS — G4733 Obstructive sleep apnea (adult) (pediatric): Secondary | ICD-10-CM | POA: Diagnosis not present

## 2022-12-25 DIAGNOSIS — F419 Anxiety disorder, unspecified: Secondary | ICD-10-CM | POA: Diagnosis not present

## 2022-12-25 DIAGNOSIS — F32A Depression, unspecified: Secondary | ICD-10-CM | POA: Diagnosis not present

## 2022-12-25 DIAGNOSIS — G2581 Restless legs syndrome: Secondary | ICD-10-CM | POA: Diagnosis not present

## 2022-12-25 DIAGNOSIS — I2510 Atherosclerotic heart disease of native coronary artery without angina pectoris: Secondary | ICD-10-CM | POA: Diagnosis not present

## 2022-12-25 DIAGNOSIS — D696 Thrombocytopenia, unspecified: Secondary | ICD-10-CM | POA: Diagnosis not present

## 2022-12-25 DIAGNOSIS — E039 Hypothyroidism, unspecified: Secondary | ICD-10-CM | POA: Diagnosis not present

## 2022-12-25 DIAGNOSIS — E785 Hyperlipidemia, unspecified: Secondary | ICD-10-CM | POA: Diagnosis not present

## 2022-12-25 DIAGNOSIS — M109 Gout, unspecified: Secondary | ICD-10-CM | POA: Diagnosis not present

## 2022-12-25 DIAGNOSIS — H3530 Unspecified macular degeneration: Secondary | ICD-10-CM | POA: Diagnosis not present

## 2022-12-25 DIAGNOSIS — J439 Emphysema, unspecified: Secondary | ICD-10-CM | POA: Diagnosis not present

## 2022-12-25 DIAGNOSIS — E43 Unspecified severe protein-calorie malnutrition: Secondary | ICD-10-CM | POA: Diagnosis not present

## 2022-12-25 DIAGNOSIS — N259 Disorder resulting from impaired renal tubular function, unspecified: Secondary | ICD-10-CM | POA: Diagnosis not present

## 2022-12-25 DIAGNOSIS — G629 Polyneuropathy, unspecified: Secondary | ICD-10-CM | POA: Diagnosis not present

## 2022-12-25 DIAGNOSIS — S72142K Displaced intertrochanteric fracture of left femur, subsequent encounter for closed fracture with nonunion: Secondary | ICD-10-CM | POA: Diagnosis not present

## 2022-12-25 DIAGNOSIS — I4510 Unspecified right bundle-branch block: Secondary | ICD-10-CM | POA: Diagnosis not present

## 2022-12-25 DIAGNOSIS — K219 Gastro-esophageal reflux disease without esophagitis: Secondary | ICD-10-CM | POA: Diagnosis not present

## 2022-12-25 DIAGNOSIS — N401 Enlarged prostate with lower urinary tract symptoms: Secondary | ICD-10-CM | POA: Diagnosis not present

## 2022-12-28 DIAGNOSIS — J439 Emphysema, unspecified: Secondary | ICD-10-CM | POA: Diagnosis not present

## 2022-12-28 DIAGNOSIS — I4510 Unspecified right bundle-branch block: Secondary | ICD-10-CM | POA: Diagnosis not present

## 2022-12-28 DIAGNOSIS — S72142K Displaced intertrochanteric fracture of left femur, subsequent encounter for closed fracture with nonunion: Secondary | ICD-10-CM | POA: Diagnosis not present

## 2022-12-28 DIAGNOSIS — E039 Hypothyroidism, unspecified: Secondary | ICD-10-CM | POA: Diagnosis not present

## 2022-12-28 DIAGNOSIS — E43 Unspecified severe protein-calorie malnutrition: Secondary | ICD-10-CM | POA: Diagnosis not present

## 2022-12-28 DIAGNOSIS — Q791 Other congenital malformations of diaphragm: Secondary | ICD-10-CM | POA: Diagnosis not present

## 2022-12-28 DIAGNOSIS — N1832 Chronic kidney disease, stage 3b: Secondary | ICD-10-CM | POA: Diagnosis not present

## 2022-12-28 DIAGNOSIS — D696 Thrombocytopenia, unspecified: Secondary | ICD-10-CM | POA: Diagnosis not present

## 2022-12-28 DIAGNOSIS — H3530 Unspecified macular degeneration: Secondary | ICD-10-CM | POA: Diagnosis not present

## 2022-12-28 DIAGNOSIS — F32A Depression, unspecified: Secondary | ICD-10-CM | POA: Diagnosis not present

## 2022-12-28 DIAGNOSIS — E8801 Alpha-1-antitrypsin deficiency: Secondary | ICD-10-CM | POA: Diagnosis not present

## 2022-12-28 DIAGNOSIS — E785 Hyperlipidemia, unspecified: Secondary | ICD-10-CM | POA: Diagnosis not present

## 2022-12-28 DIAGNOSIS — F419 Anxiety disorder, unspecified: Secondary | ICD-10-CM | POA: Diagnosis not present

## 2022-12-28 DIAGNOSIS — I2510 Atherosclerotic heart disease of native coronary artery without angina pectoris: Secondary | ICD-10-CM | POA: Diagnosis not present

## 2022-12-28 DIAGNOSIS — G629 Polyneuropathy, unspecified: Secondary | ICD-10-CM | POA: Diagnosis not present

## 2022-12-28 DIAGNOSIS — I129 Hypertensive chronic kidney disease with stage 1 through stage 4 chronic kidney disease, or unspecified chronic kidney disease: Secondary | ICD-10-CM | POA: Diagnosis not present

## 2023-01-01 DIAGNOSIS — Q791 Other congenital malformations of diaphragm: Secondary | ICD-10-CM | POA: Diagnosis not present

## 2023-01-01 DIAGNOSIS — F32A Depression, unspecified: Secondary | ICD-10-CM | POA: Diagnosis not present

## 2023-01-01 DIAGNOSIS — I4510 Unspecified right bundle-branch block: Secondary | ICD-10-CM | POA: Diagnosis not present

## 2023-01-01 DIAGNOSIS — D696 Thrombocytopenia, unspecified: Secondary | ICD-10-CM | POA: Diagnosis not present

## 2023-01-01 DIAGNOSIS — I2510 Atherosclerotic heart disease of native coronary artery without angina pectoris: Secondary | ICD-10-CM | POA: Diagnosis not present

## 2023-01-01 DIAGNOSIS — E785 Hyperlipidemia, unspecified: Secondary | ICD-10-CM | POA: Diagnosis not present

## 2023-01-01 DIAGNOSIS — S72142K Displaced intertrochanteric fracture of left femur, subsequent encounter for closed fracture with nonunion: Secondary | ICD-10-CM | POA: Diagnosis not present

## 2023-01-01 DIAGNOSIS — I129 Hypertensive chronic kidney disease with stage 1 through stage 4 chronic kidney disease, or unspecified chronic kidney disease: Secondary | ICD-10-CM | POA: Diagnosis not present

## 2023-01-01 DIAGNOSIS — E039 Hypothyroidism, unspecified: Secondary | ICD-10-CM | POA: Diagnosis not present

## 2023-01-01 DIAGNOSIS — G629 Polyneuropathy, unspecified: Secondary | ICD-10-CM | POA: Diagnosis not present

## 2023-01-01 DIAGNOSIS — E43 Unspecified severe protein-calorie malnutrition: Secondary | ICD-10-CM | POA: Diagnosis not present

## 2023-01-01 DIAGNOSIS — E8801 Alpha-1-antitrypsin deficiency: Secondary | ICD-10-CM | POA: Diagnosis not present

## 2023-01-01 DIAGNOSIS — N1832 Chronic kidney disease, stage 3b: Secondary | ICD-10-CM | POA: Diagnosis not present

## 2023-01-01 DIAGNOSIS — H3530 Unspecified macular degeneration: Secondary | ICD-10-CM | POA: Diagnosis not present

## 2023-01-01 DIAGNOSIS — J439 Emphysema, unspecified: Secondary | ICD-10-CM | POA: Diagnosis not present

## 2023-01-01 DIAGNOSIS — F419 Anxiety disorder, unspecified: Secondary | ICD-10-CM | POA: Diagnosis not present

## 2023-01-04 DIAGNOSIS — N401 Enlarged prostate with lower urinary tract symptoms: Secondary | ICD-10-CM | POA: Diagnosis not present

## 2023-01-04 DIAGNOSIS — R338 Other retention of urine: Secondary | ICD-10-CM | POA: Diagnosis not present

## 2023-01-08 DIAGNOSIS — F419 Anxiety disorder, unspecified: Secondary | ICD-10-CM | POA: Diagnosis not present

## 2023-01-08 DIAGNOSIS — I129 Hypertensive chronic kidney disease with stage 1 through stage 4 chronic kidney disease, or unspecified chronic kidney disease: Secondary | ICD-10-CM | POA: Diagnosis not present

## 2023-01-08 DIAGNOSIS — N1832 Chronic kidney disease, stage 3b: Secondary | ICD-10-CM | POA: Diagnosis not present

## 2023-01-08 DIAGNOSIS — G629 Polyneuropathy, unspecified: Secondary | ICD-10-CM | POA: Diagnosis not present

## 2023-01-08 DIAGNOSIS — E785 Hyperlipidemia, unspecified: Secondary | ICD-10-CM | POA: Diagnosis not present

## 2023-01-08 DIAGNOSIS — J439 Emphysema, unspecified: Secondary | ICD-10-CM | POA: Diagnosis not present

## 2023-01-08 DIAGNOSIS — I2510 Atherosclerotic heart disease of native coronary artery without angina pectoris: Secondary | ICD-10-CM | POA: Diagnosis not present

## 2023-01-08 DIAGNOSIS — F32A Depression, unspecified: Secondary | ICD-10-CM | POA: Diagnosis not present

## 2023-01-08 DIAGNOSIS — I4510 Unspecified right bundle-branch block: Secondary | ICD-10-CM | POA: Diagnosis not present

## 2023-01-08 DIAGNOSIS — D696 Thrombocytopenia, unspecified: Secondary | ICD-10-CM | POA: Diagnosis not present

## 2023-01-08 DIAGNOSIS — E43 Unspecified severe protein-calorie malnutrition: Secondary | ICD-10-CM | POA: Diagnosis not present

## 2023-01-08 DIAGNOSIS — E8801 Alpha-1-antitrypsin deficiency: Secondary | ICD-10-CM | POA: Diagnosis not present

## 2023-01-08 DIAGNOSIS — E039 Hypothyroidism, unspecified: Secondary | ICD-10-CM | POA: Diagnosis not present

## 2023-01-08 DIAGNOSIS — H3530 Unspecified macular degeneration: Secondary | ICD-10-CM | POA: Diagnosis not present

## 2023-01-08 DIAGNOSIS — Q791 Other congenital malformations of diaphragm: Secondary | ICD-10-CM | POA: Diagnosis not present

## 2023-01-08 DIAGNOSIS — S72142K Displaced intertrochanteric fracture of left femur, subsequent encounter for closed fracture with nonunion: Secondary | ICD-10-CM | POA: Diagnosis not present

## 2023-01-10 ENCOUNTER — Encounter: Payer: Self-pay | Admitting: Pulmonary Disease

## 2023-01-10 ENCOUNTER — Ambulatory Visit: Payer: Medicare Other | Admitting: Pulmonary Disease

## 2023-01-10 VITALS — BP 130/60 | HR 89 | Temp 97.6°F | Ht 71.0 in | Wt 155.4 lb

## 2023-01-10 DIAGNOSIS — J449 Chronic obstructive pulmonary disease, unspecified: Secondary | ICD-10-CM | POA: Diagnosis not present

## 2023-01-10 NOTE — Patient Instructions (Signed)
I am glad you are getting back on your feet Continue the [facility] and stay active with physical therapy Follow-up in 1 year

## 2023-01-10 NOTE — Progress Notes (Signed)
Timothy Spencer    [id number]    1941/05/10  Primary Care Physician:Amin, Jason Fila, MD  Referring Physician: Eloisa Northern, MD [address] [place],  [place] [address]  Chief complaint: Follow-up for dyspnea, emphysema  HPI: 82 y.o.  with moderate COPD, hypertension, hypothyroidism, restless leg syndrome, coronary artery disease.  Seen at [facility] pulmonary for evaluation of dyspnea in 2017.  He had a cardiopulmonary exercise test done there which showed deconditioning.  Also has history of right bundle branch block, coronary artery disease and follows with cardiology on a regular basis. He has enrolled in an exercise program and works out 4 times a week.  Reports marked improvement in symptoms of dyspnea. Completed rehab program in 2020  Over 100 pack year of smoking in Quit 2015  Interim history: He had repeat CT scan, labs and PFTs in 2023 for worsening dyspnea that did not show significant interstitial lung disease and PFTs actually showed an improvement in lung volumes  He has been referred to [facility] for evaluation of endobronchial lung volume reduction.   Received note from a few interventional pulmonary clinic consult by Dr. Orson Slick dated 03/20/2022 They note that he has chronic right hemidiaphragm elevation causing some restrictive changes in his lungs so he does not have hyperinflation and would not benefit from lung volume reduction.  He had a fall and right hip fracture which needed a revision recently.  That set him back with his exercise regimen and he has more dyspnea on exertion.  He is now working with PT to get active again  Outpatient Encounter Medications as of 01/10/2023  Medication Sig   albuterol (VENTOLIN HFA) 108 (90 Base) MCG/ACT inhaler Inhale 2 puffs into the lungs every 6 (six) hours as needed for wheezing or shortness of breath.   amLODipine (NORVASC) 5 MG tablet Take 1 tablet (5 mg total) by mouth daily.   Budeson-Glycopyrrol-Formoterol  (BREZTRI AEROSPHERE) 160-9-4.8 MCG/ACT AERO Inhale 2 puffs into the lungs in the morning and at bedtime.   buPROPion (WELLBUTRIN XL) 150 MG 24 hr tablet Take 150 mg by mouth every morning.   feeding supplement (ENSURE ENLIVE / ENSURE PLUS) LIQD Take 237 mLs by mouth 2 (two) times daily between meals.   ipratropium-albuterol (DUONEB) 0.5-2.5 (3) MG/3ML SOLN Take 3 mLs by nebulization every 4 (four) hours as needed. (Patient taking differently: Take 3 mLs by nebulization every 4 (four) hours as needed (wheezing, SOB).)   latanoprost (XALATAN) 0.005 % ophthalmic solution Place 1 drop into both eyes at bedtime.   levothyroxine (SYNTHROID) 112 MCG tablet Take 112 mcg by mouth daily.   rOPINIRole (REQUIP) 1 MG tablet Take 1 mg by mouth 3 (three) times daily.   rosuvastatin (CRESTOR) 5 MG tablet Take 5 mg by mouth daily.   senna-docusate (SENOKOT-S) 8.6-50 MG tablet Take 1 tablet by mouth at bedtime as needed for mild constipation.   sertraline (ZOLOFT) 100 MG tablet Take 100 mg by mouth daily.   tamsulosin (FLOMAX) 0.4 MG CAPS capsule Take 0.4 mg by mouth daily.    Vitamin D, Ergocalciferol, (DRISDOL) 1.25 MG (50000 UNIT) CAPS capsule Take 1 capsule (50,000 Units total) by mouth every 7 (seven) days.   enoxaparin (LOVENOX) 40 MG/0.4ML injection Inject 0.4 mLs (40 mg total) into the skin daily for 26 days.   No facility-administered encounter medications on file as of 01/10/2023.   Physical Exam: Blood pressure 130/60, pulse 89, temperature 97.6 F (36.4  C), temperature source Oral, height 5\' 11"  (1.803 m), weight 155 lb 6.4 oz (70.5 kg), SpO2 100%. Gen:      No acute distress HEENT:  EOMI, sclera anicteric Neck:     No masses; no thyromegaly Lungs:    Clear to auscultation bilaterally; normal respiratory effort CV:         Regular rate and rhythm; no murmurs Abd:      + bowel sounds; soft, non-tender; no palpable masses, no distension Ext:    No edema; adequate peripheral perfusion Skin:      Warm  and dry; no rash Neuro: alert and oriented x 3 Psych: normal mood and affect   Data Reviewed: PFTs  06/26/2018 FVC 2.93 [55%], FEV1 2.03 [60%], F/F 69, TLC 5.54 (10 4%), DLCO 16.26 (46%)  01/14/2021 FVC 2.77 (63%], FEV1 2.00 [63%], F/F 72, TLC 5.26 [90%], DLCO 14.78 [10%] Mild restriction, severe diffusion defect  11/22/2021 FVC 2.26 [52%], FEV1 1.67 [54%], F/F 74, TLC 5.39 [72%], DLCO 15.50 [60%] Mild restriction, moderate diffusion  04/11/17:  Walked 312 Meters / Baseline Sat 100% on RA / Nadir Sat 98% on RA   Imaging CT chest 10/11/16- 4 mm left lower lobe nodule.  Previously noted lower lobe opacities have resolved.  Centrilobular emphysema.  Pleural thickening versus small effusion on the left CT chest 07/13/16-6 mm left lower lobe nodule.  9 mm left lower lobe nodule.  4 mm left lower lobe nodule.   Questionable pleural thickening versus pleural effusion CT 10/12/2017- Emphysema, stable pulmonary nodules.  Pleural-parenchymal scarring at the right hemithorax. Chest x-ray 06/26/18-chronic right hemidiaphragm elevation with mild atelectasis. High-res CT 09/07/2021-irregular peripheral opacities and septal thickening, right hemidiaphragm elevation, emphysema. I have reviewed the images personally.  ESOPHAGRAM/BARIUM SWALLOW 09/10/15:  Small hiatal hernia with mild stricture at the gastroesophageal junction. Negative for reflux.    LABS Alpha-1 antitrypsin 03/06/2017-159, MS phenotype Alpha-1 antitrypsin 06/06/2018-156 Alpha-1 antitrypsin 08/10/2021-148  CBC 06/26/2018-WBC 7.4, eos 2%, absolute eosinophil count 148 CBC 08/10/2021-WBC 7.6, hemoglobin 15.1, eos 2.2% IgE 149  01/29/09 ANA:  Negative RA:  79  Cardiac: Cardio pulmonary Exercise stress test 2017 from [facility] This cardiopulmonary exercise test demonstrated a decreased exercise capacity. The patient did not demonstrate a pulmonary limitation. Given the patient's anaerobic threshold > 40% with increased heart rate  reserve, poor effort or deconditioning can account for the patient's symptoms. However, the patient did display a reduced oxygen pulse which is an indirect measure of stroke volume which may indicate early cardiovascular disease. Please note that the patient's baseline EKG displayed a RBBB limiting an evaluation of ST segment alterations during the procedure.  Echocardiogram 10/28/2021 LVEF 60 to 65%, grade 1 diastolic dysfunction.  Normal RV size and function  Assessment:  Emphysema No significant obstruction on PFTs Follow-up PFTs reviewed with actually improvement in lung volumes and diffusion capacity and his CT scan does not show significant changes compared to prior and no evidence of interstitial lung disease.  Echocardiogram as noted above is unremarkable  He likes breztri better than trelegy and will continue the same.  Continue albuterol, DuoNebs Discussed pulmonary rehab but he has declined as he is recovering from hip surgery and is working with PT He is not a candidate for endobronchial valve per [facility] evaluation.    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 alpha-1 antitrypsin levels have remained stable  Left lower lobe pulmonary nodule. Has remained stable on follow-up scans.  He is past the age cutoff  to continue low-dose screening CT Reviewed chest x-ray  Health maintenance 03/02/2019-influenza Patient states that he is up-to-date with his pneumonia vaccine at his primary care. He is also vaccinated against Covid  Plan/Recommendations: - Continue breztri, albuterol, DuoNebs   Chilton Greathouse MD [facility] 01/10/2023, 2:20 PM  CC: Eloisa Northern, MD

## 2023-01-19 DIAGNOSIS — I2510 Atherosclerotic heart disease of native coronary artery without angina pectoris: Secondary | ICD-10-CM | POA: Diagnosis not present

## 2023-01-19 DIAGNOSIS — F419 Anxiety disorder, unspecified: Secondary | ICD-10-CM | POA: Diagnosis not present

## 2023-01-19 DIAGNOSIS — F32A Depression, unspecified: Secondary | ICD-10-CM | POA: Diagnosis not present

## 2023-01-19 DIAGNOSIS — E785 Hyperlipidemia, unspecified: Secondary | ICD-10-CM | POA: Diagnosis not present

## 2023-01-19 DIAGNOSIS — E43 Unspecified severe protein-calorie malnutrition: Secondary | ICD-10-CM | POA: Diagnosis not present

## 2023-01-19 DIAGNOSIS — N1832 Chronic kidney disease, stage 3b: Secondary | ICD-10-CM | POA: Diagnosis not present

## 2023-01-19 DIAGNOSIS — H3530 Unspecified macular degeneration: Secondary | ICD-10-CM | POA: Diagnosis not present

## 2023-01-19 DIAGNOSIS — Q791 Other congenital malformations of diaphragm: Secondary | ICD-10-CM | POA: Diagnosis not present

## 2023-01-19 DIAGNOSIS — E8801 Alpha-1-antitrypsin deficiency: Secondary | ICD-10-CM | POA: Diagnosis not present

## 2023-01-19 DIAGNOSIS — S72142K Displaced intertrochanteric fracture of left femur, subsequent encounter for closed fracture with nonunion: Secondary | ICD-10-CM | POA: Diagnosis not present

## 2023-01-19 DIAGNOSIS — I4510 Unspecified right bundle-branch block: Secondary | ICD-10-CM | POA: Diagnosis not present

## 2023-01-19 DIAGNOSIS — G629 Polyneuropathy, unspecified: Secondary | ICD-10-CM | POA: Diagnosis not present

## 2023-01-19 DIAGNOSIS — D696 Thrombocytopenia, unspecified: Secondary | ICD-10-CM | POA: Diagnosis not present

## 2023-01-19 DIAGNOSIS — E039 Hypothyroidism, unspecified: Secondary | ICD-10-CM | POA: Diagnosis not present

## 2023-01-19 DIAGNOSIS — J439 Emphysema, unspecified: Secondary | ICD-10-CM | POA: Diagnosis not present

## 2023-01-19 DIAGNOSIS — I129 Hypertensive chronic kidney disease with stage 1 through stage 4 chronic kidney disease, or unspecified chronic kidney disease: Secondary | ICD-10-CM | POA: Diagnosis not present

## 2023-01-23 DIAGNOSIS — E8801 Alpha-1-antitrypsin deficiency: Secondary | ICD-10-CM | POA: Diagnosis not present

## 2023-01-23 DIAGNOSIS — I2510 Atherosclerotic heart disease of native coronary artery without angina pectoris: Secondary | ICD-10-CM | POA: Diagnosis not present

## 2023-01-23 DIAGNOSIS — J439 Emphysema, unspecified: Secondary | ICD-10-CM | POA: Diagnosis not present

## 2023-01-23 DIAGNOSIS — E039 Hypothyroidism, unspecified: Secondary | ICD-10-CM | POA: Diagnosis not present

## 2023-01-23 DIAGNOSIS — F32A Depression, unspecified: Secondary | ICD-10-CM | POA: Diagnosis not present

## 2023-01-23 DIAGNOSIS — F419 Anxiety disorder, unspecified: Secondary | ICD-10-CM | POA: Diagnosis not present

## 2023-01-23 DIAGNOSIS — H3530 Unspecified macular degeneration: Secondary | ICD-10-CM | POA: Diagnosis not present

## 2023-01-23 DIAGNOSIS — S72142K Displaced intertrochanteric fracture of left femur, subsequent encounter for closed fracture with nonunion: Secondary | ICD-10-CM | POA: Diagnosis not present

## 2023-01-23 DIAGNOSIS — G629 Polyneuropathy, unspecified: Secondary | ICD-10-CM | POA: Diagnosis not present

## 2023-01-23 DIAGNOSIS — I129 Hypertensive chronic kidney disease with stage 1 through stage 4 chronic kidney disease, or unspecified chronic kidney disease: Secondary | ICD-10-CM | POA: Diagnosis not present

## 2023-01-23 DIAGNOSIS — N1832 Chronic kidney disease, stage 3b: Secondary | ICD-10-CM | POA: Diagnosis not present

## 2023-01-23 DIAGNOSIS — D696 Thrombocytopenia, unspecified: Secondary | ICD-10-CM | POA: Diagnosis not present

## 2023-01-23 DIAGNOSIS — E785 Hyperlipidemia, unspecified: Secondary | ICD-10-CM | POA: Diagnosis not present

## 2023-01-23 DIAGNOSIS — Q791 Other congenital malformations of diaphragm: Secondary | ICD-10-CM | POA: Diagnosis not present

## 2023-01-23 DIAGNOSIS — E43 Unspecified severe protein-calorie malnutrition: Secondary | ICD-10-CM | POA: Diagnosis not present

## 2023-01-23 DIAGNOSIS — I4510 Unspecified right bundle-branch block: Secondary | ICD-10-CM | POA: Diagnosis not present

## 2023-01-24 DIAGNOSIS — E785 Hyperlipidemia, unspecified: Secondary | ICD-10-CM | POA: Diagnosis not present

## 2023-01-24 DIAGNOSIS — I129 Hypertensive chronic kidney disease with stage 1 through stage 4 chronic kidney disease, or unspecified chronic kidney disease: Secondary | ICD-10-CM | POA: Diagnosis not present

## 2023-01-24 DIAGNOSIS — H409 Unspecified glaucoma: Secondary | ICD-10-CM | POA: Diagnosis not present

## 2023-01-24 DIAGNOSIS — F419 Anxiety disorder, unspecified: Secondary | ICD-10-CM | POA: Diagnosis not present

## 2023-01-24 DIAGNOSIS — G2581 Restless legs syndrome: Secondary | ICD-10-CM | POA: Diagnosis not present

## 2023-01-24 DIAGNOSIS — G629 Polyneuropathy, unspecified: Secondary | ICD-10-CM | POA: Diagnosis not present

## 2023-01-24 DIAGNOSIS — E43 Unspecified severe protein-calorie malnutrition: Secondary | ICD-10-CM | POA: Diagnosis not present

## 2023-01-24 DIAGNOSIS — E8801 Alpha-1-antitrypsin deficiency: Secondary | ICD-10-CM | POA: Diagnosis not present

## 2023-01-24 DIAGNOSIS — H3530 Unspecified macular degeneration: Secondary | ICD-10-CM | POA: Diagnosis not present

## 2023-01-24 DIAGNOSIS — R338 Other retention of urine: Secondary | ICD-10-CM | POA: Diagnosis not present

## 2023-01-24 DIAGNOSIS — I2510 Atherosclerotic heart disease of native coronary artery without angina pectoris: Secondary | ICD-10-CM | POA: Diagnosis not present

## 2023-01-24 DIAGNOSIS — J439 Emphysema, unspecified: Secondary | ICD-10-CM | POA: Diagnosis not present

## 2023-01-24 DIAGNOSIS — I440 Atrioventricular block, first degree: Secondary | ICD-10-CM | POA: Diagnosis not present

## 2023-01-24 DIAGNOSIS — N1832 Chronic kidney disease, stage 3b: Secondary | ICD-10-CM | POA: Diagnosis not present

## 2023-01-24 DIAGNOSIS — S72142K Displaced intertrochanteric fracture of left femur, subsequent encounter for closed fracture with nonunion: Secondary | ICD-10-CM | POA: Diagnosis not present

## 2023-01-24 DIAGNOSIS — Q791 Other congenital malformations of diaphragm: Secondary | ICD-10-CM | POA: Diagnosis not present

## 2023-01-24 DIAGNOSIS — N259 Disorder resulting from impaired renal tubular function, unspecified: Secondary | ICD-10-CM | POA: Diagnosis not present

## 2023-01-24 DIAGNOSIS — K219 Gastro-esophageal reflux disease without esophagitis: Secondary | ICD-10-CM | POA: Diagnosis not present

## 2023-01-24 DIAGNOSIS — E039 Hypothyroidism, unspecified: Secondary | ICD-10-CM | POA: Diagnosis not present

## 2023-01-24 DIAGNOSIS — D696 Thrombocytopenia, unspecified: Secondary | ICD-10-CM | POA: Diagnosis not present

## 2023-01-24 DIAGNOSIS — N401 Enlarged prostate with lower urinary tract symptoms: Secondary | ICD-10-CM | POA: Diagnosis not present

## 2023-01-24 DIAGNOSIS — I4510 Unspecified right bundle-branch block: Secondary | ICD-10-CM | POA: Diagnosis not present

## 2023-01-24 DIAGNOSIS — G4733 Obstructive sleep apnea (adult) (pediatric): Secondary | ICD-10-CM | POA: Diagnosis not present

## 2023-01-24 DIAGNOSIS — F32A Depression, unspecified: Secondary | ICD-10-CM | POA: Diagnosis not present

## 2023-01-24 DIAGNOSIS — M109 Gout, unspecified: Secondary | ICD-10-CM | POA: Diagnosis not present

## 2023-01-25 DIAGNOSIS — R338 Other retention of urine: Secondary | ICD-10-CM | POA: Diagnosis not present

## 2023-01-26 DIAGNOSIS — F32A Depression, unspecified: Secondary | ICD-10-CM | POA: Diagnosis not present

## 2023-01-26 DIAGNOSIS — S72142K Displaced intertrochanteric fracture of left femur, subsequent encounter for closed fracture with nonunion: Secondary | ICD-10-CM | POA: Diagnosis not present

## 2023-01-26 DIAGNOSIS — I129 Hypertensive chronic kidney disease with stage 1 through stage 4 chronic kidney disease, or unspecified chronic kidney disease: Secondary | ICD-10-CM | POA: Diagnosis not present

## 2023-01-26 DIAGNOSIS — J439 Emphysema, unspecified: Secondary | ICD-10-CM | POA: Diagnosis not present

## 2023-01-26 DIAGNOSIS — E43 Unspecified severe protein-calorie malnutrition: Secondary | ICD-10-CM | POA: Diagnosis not present

## 2023-01-26 DIAGNOSIS — H3530 Unspecified macular degeneration: Secondary | ICD-10-CM | POA: Diagnosis not present

## 2023-01-26 DIAGNOSIS — G629 Polyneuropathy, unspecified: Secondary | ICD-10-CM | POA: Diagnosis not present

## 2023-01-26 DIAGNOSIS — E039 Hypothyroidism, unspecified: Secondary | ICD-10-CM | POA: Diagnosis not present

## 2023-01-26 DIAGNOSIS — I2510 Atherosclerotic heart disease of native coronary artery without angina pectoris: Secondary | ICD-10-CM | POA: Diagnosis not present

## 2023-01-26 DIAGNOSIS — F419 Anxiety disorder, unspecified: Secondary | ICD-10-CM | POA: Diagnosis not present

## 2023-01-26 DIAGNOSIS — I4510 Unspecified right bundle-branch block: Secondary | ICD-10-CM | POA: Diagnosis not present

## 2023-01-26 DIAGNOSIS — Q791 Other congenital malformations of diaphragm: Secondary | ICD-10-CM | POA: Diagnosis not present

## 2023-01-26 DIAGNOSIS — N1832 Chronic kidney disease, stage 3b: Secondary | ICD-10-CM | POA: Diagnosis not present

## 2023-01-26 DIAGNOSIS — D696 Thrombocytopenia, unspecified: Secondary | ICD-10-CM | POA: Diagnosis not present

## 2023-01-26 DIAGNOSIS — E785 Hyperlipidemia, unspecified: Secondary | ICD-10-CM | POA: Diagnosis not present

## 2023-01-26 DIAGNOSIS — E8801 Alpha-1-antitrypsin deficiency: Secondary | ICD-10-CM | POA: Diagnosis not present

## 2023-01-29 DIAGNOSIS — I2510 Atherosclerotic heart disease of native coronary artery without angina pectoris: Secondary | ICD-10-CM | POA: Diagnosis not present

## 2023-01-29 DIAGNOSIS — J439 Emphysema, unspecified: Secondary | ICD-10-CM | POA: Diagnosis not present

## 2023-01-29 DIAGNOSIS — E43 Unspecified severe protein-calorie malnutrition: Secondary | ICD-10-CM | POA: Diagnosis not present

## 2023-01-29 DIAGNOSIS — I129 Hypertensive chronic kidney disease with stage 1 through stage 4 chronic kidney disease, or unspecified chronic kidney disease: Secondary | ICD-10-CM | POA: Diagnosis not present

## 2023-01-29 DIAGNOSIS — E8801 Alpha-1-antitrypsin deficiency: Secondary | ICD-10-CM | POA: Diagnosis not present

## 2023-01-29 DIAGNOSIS — Q791 Other congenital malformations of diaphragm: Secondary | ICD-10-CM | POA: Diagnosis not present

## 2023-01-29 DIAGNOSIS — S72142K Displaced intertrochanteric fracture of left femur, subsequent encounter for closed fracture with nonunion: Secondary | ICD-10-CM | POA: Diagnosis not present

## 2023-01-29 DIAGNOSIS — I4510 Unspecified right bundle-branch block: Secondary | ICD-10-CM | POA: Diagnosis not present

## 2023-01-29 DIAGNOSIS — D696 Thrombocytopenia, unspecified: Secondary | ICD-10-CM | POA: Diagnosis not present

## 2023-01-29 DIAGNOSIS — E039 Hypothyroidism, unspecified: Secondary | ICD-10-CM | POA: Diagnosis not present

## 2023-01-29 DIAGNOSIS — H3530 Unspecified macular degeneration: Secondary | ICD-10-CM | POA: Diagnosis not present

## 2023-01-29 DIAGNOSIS — N1832 Chronic kidney disease, stage 3b: Secondary | ICD-10-CM | POA: Diagnosis not present

## 2023-01-29 DIAGNOSIS — E785 Hyperlipidemia, unspecified: Secondary | ICD-10-CM | POA: Diagnosis not present

## 2023-01-29 DIAGNOSIS — G629 Polyneuropathy, unspecified: Secondary | ICD-10-CM | POA: Diagnosis not present

## 2023-01-29 DIAGNOSIS — F32A Depression, unspecified: Secondary | ICD-10-CM | POA: Diagnosis not present

## 2023-01-29 DIAGNOSIS — F419 Anxiety disorder, unspecified: Secondary | ICD-10-CM | POA: Diagnosis not present

## 2023-01-31 ENCOUNTER — Other Ambulatory Visit (HOSPITAL_COMMUNITY): Payer: Self-pay | Admitting: Urology

## 2023-01-31 DIAGNOSIS — R339 Retention of urine, unspecified: Secondary | ICD-10-CM

## 2023-02-06 DIAGNOSIS — I4510 Unspecified right bundle-branch block: Secondary | ICD-10-CM | POA: Diagnosis not present

## 2023-02-06 DIAGNOSIS — I129 Hypertensive chronic kidney disease with stage 1 through stage 4 chronic kidney disease, or unspecified chronic kidney disease: Secondary | ICD-10-CM | POA: Diagnosis not present

## 2023-02-06 DIAGNOSIS — J439 Emphysema, unspecified: Secondary | ICD-10-CM | POA: Diagnosis not present

## 2023-02-06 DIAGNOSIS — E039 Hypothyroidism, unspecified: Secondary | ICD-10-CM | POA: Diagnosis not present

## 2023-02-06 DIAGNOSIS — G629 Polyneuropathy, unspecified: Secondary | ICD-10-CM | POA: Diagnosis not present

## 2023-02-06 DIAGNOSIS — N1832 Chronic kidney disease, stage 3b: Secondary | ICD-10-CM | POA: Diagnosis not present

## 2023-02-06 DIAGNOSIS — S72142K Displaced intertrochanteric fracture of left femur, subsequent encounter for closed fracture with nonunion: Secondary | ICD-10-CM | POA: Diagnosis not present

## 2023-02-06 DIAGNOSIS — F32A Depression, unspecified: Secondary | ICD-10-CM | POA: Diagnosis not present

## 2023-02-06 DIAGNOSIS — I2510 Atherosclerotic heart disease of native coronary artery without angina pectoris: Secondary | ICD-10-CM | POA: Diagnosis not present

## 2023-02-06 DIAGNOSIS — H3530 Unspecified macular degeneration: Secondary | ICD-10-CM | POA: Diagnosis not present

## 2023-02-06 DIAGNOSIS — D696 Thrombocytopenia, unspecified: Secondary | ICD-10-CM | POA: Diagnosis not present

## 2023-02-06 DIAGNOSIS — Q791 Other congenital malformations of diaphragm: Secondary | ICD-10-CM | POA: Diagnosis not present

## 2023-02-06 DIAGNOSIS — E43 Unspecified severe protein-calorie malnutrition: Secondary | ICD-10-CM | POA: Diagnosis not present

## 2023-02-06 DIAGNOSIS — E8801 Alpha-1-antitrypsin deficiency: Secondary | ICD-10-CM | POA: Diagnosis not present

## 2023-02-06 DIAGNOSIS — F419 Anxiety disorder, unspecified: Secondary | ICD-10-CM | POA: Diagnosis not present

## 2023-02-06 DIAGNOSIS — E785 Hyperlipidemia, unspecified: Secondary | ICD-10-CM | POA: Diagnosis not present

## 2023-02-13 DIAGNOSIS — J439 Emphysema, unspecified: Secondary | ICD-10-CM | POA: Diagnosis not present

## 2023-02-13 DIAGNOSIS — F32A Depression, unspecified: Secondary | ICD-10-CM | POA: Diagnosis not present

## 2023-02-13 DIAGNOSIS — I129 Hypertensive chronic kidney disease with stage 1 through stage 4 chronic kidney disease, or unspecified chronic kidney disease: Secondary | ICD-10-CM | POA: Diagnosis not present

## 2023-02-13 DIAGNOSIS — E785 Hyperlipidemia, unspecified: Secondary | ICD-10-CM | POA: Diagnosis not present

## 2023-02-13 DIAGNOSIS — D696 Thrombocytopenia, unspecified: Secondary | ICD-10-CM | POA: Diagnosis not present

## 2023-02-13 DIAGNOSIS — S72142K Displaced intertrochanteric fracture of left femur, subsequent encounter for closed fracture with nonunion: Secondary | ICD-10-CM | POA: Diagnosis not present

## 2023-02-13 DIAGNOSIS — Q791 Other congenital malformations of diaphragm: Secondary | ICD-10-CM | POA: Diagnosis not present

## 2023-02-13 DIAGNOSIS — I4510 Unspecified right bundle-branch block: Secondary | ICD-10-CM | POA: Diagnosis not present

## 2023-02-13 DIAGNOSIS — N1832 Chronic kidney disease, stage 3b: Secondary | ICD-10-CM | POA: Diagnosis not present

## 2023-02-13 DIAGNOSIS — I2510 Atherosclerotic heart disease of native coronary artery without angina pectoris: Secondary | ICD-10-CM | POA: Diagnosis not present

## 2023-02-13 DIAGNOSIS — G629 Polyneuropathy, unspecified: Secondary | ICD-10-CM | POA: Diagnosis not present

## 2023-02-13 DIAGNOSIS — F419 Anxiety disorder, unspecified: Secondary | ICD-10-CM | POA: Diagnosis not present

## 2023-02-13 DIAGNOSIS — E039 Hypothyroidism, unspecified: Secondary | ICD-10-CM | POA: Diagnosis not present

## 2023-02-13 DIAGNOSIS — H3530 Unspecified macular degeneration: Secondary | ICD-10-CM | POA: Diagnosis not present

## 2023-02-13 DIAGNOSIS — E43 Unspecified severe protein-calorie malnutrition: Secondary | ICD-10-CM | POA: Diagnosis not present

## 2023-02-13 DIAGNOSIS — E8801 Alpha-1-antitrypsin deficiency: Secondary | ICD-10-CM | POA: Diagnosis not present

## 2023-02-16 ENCOUNTER — Other Ambulatory Visit: Payer: Self-pay | Admitting: Pulmonary Disease

## 2023-02-19 ENCOUNTER — Other Ambulatory Visit: Payer: Self-pay | Admitting: Student

## 2023-02-19 DIAGNOSIS — E8801 Alpha-1-antitrypsin deficiency: Secondary | ICD-10-CM | POA: Diagnosis not present

## 2023-02-19 DIAGNOSIS — Q791 Other congenital malformations of diaphragm: Secondary | ICD-10-CM | POA: Diagnosis not present

## 2023-02-19 DIAGNOSIS — E43 Unspecified severe protein-calorie malnutrition: Secondary | ICD-10-CM | POA: Diagnosis not present

## 2023-02-19 DIAGNOSIS — I129 Hypertensive chronic kidney disease with stage 1 through stage 4 chronic kidney disease, or unspecified chronic kidney disease: Secondary | ICD-10-CM | POA: Diagnosis not present

## 2023-02-19 DIAGNOSIS — I2510 Atherosclerotic heart disease of native coronary artery without angina pectoris: Secondary | ICD-10-CM | POA: Diagnosis not present

## 2023-02-19 DIAGNOSIS — G629 Polyneuropathy, unspecified: Secondary | ICD-10-CM | POA: Diagnosis not present

## 2023-02-19 DIAGNOSIS — N1832 Chronic kidney disease, stage 3b: Secondary | ICD-10-CM | POA: Diagnosis not present

## 2023-02-19 DIAGNOSIS — E039 Hypothyroidism, unspecified: Secondary | ICD-10-CM | POA: Diagnosis not present

## 2023-02-19 DIAGNOSIS — N390 Urinary tract infection, site not specified: Secondary | ICD-10-CM

## 2023-02-19 DIAGNOSIS — F419 Anxiety disorder, unspecified: Secondary | ICD-10-CM | POA: Diagnosis not present

## 2023-02-19 DIAGNOSIS — I4510 Unspecified right bundle-branch block: Secondary | ICD-10-CM | POA: Diagnosis not present

## 2023-02-19 DIAGNOSIS — D696 Thrombocytopenia, unspecified: Secondary | ICD-10-CM | POA: Diagnosis not present

## 2023-02-19 DIAGNOSIS — E785 Hyperlipidemia, unspecified: Secondary | ICD-10-CM | POA: Diagnosis not present

## 2023-02-19 DIAGNOSIS — S72142K Displaced intertrochanteric fracture of left femur, subsequent encounter for closed fracture with nonunion: Secondary | ICD-10-CM | POA: Diagnosis not present

## 2023-02-19 DIAGNOSIS — F32A Depression, unspecified: Secondary | ICD-10-CM | POA: Diagnosis not present

## 2023-02-19 DIAGNOSIS — J439 Emphysema, unspecified: Secondary | ICD-10-CM | POA: Diagnosis not present

## 2023-02-19 DIAGNOSIS — H3530 Unspecified macular degeneration: Secondary | ICD-10-CM | POA: Diagnosis not present

## 2023-02-20 ENCOUNTER — Ambulatory Visit (HOSPITAL_COMMUNITY)
Admission: RE | Admit: 2023-02-20 | Discharge: 2023-02-20 | Disposition: A | Discharge status: Home / Self Care | Payer: Medicare Other | Source: Ambulatory Visit | Attending: Urology | Admitting: Urology

## 2023-02-20 ENCOUNTER — Other Ambulatory Visit: Payer: Self-pay

## 2023-02-20 ENCOUNTER — Encounter (HOSPITAL_COMMUNITY): Payer: Self-pay

## 2023-02-20 ENCOUNTER — Other Ambulatory Visit: Payer: Self-pay | Admitting: Radiology

## 2023-02-20 DIAGNOSIS — N390 Urinary tract infection, site not specified: Secondary | ICD-10-CM | POA: Diagnosis not present

## 2023-02-20 DIAGNOSIS — Z9889 Other specified postprocedural states: Secondary | ICD-10-CM | POA: Insufficient documentation

## 2023-02-20 DIAGNOSIS — R339 Retention of urine, unspecified: Secondary | ICD-10-CM | POA: Insufficient documentation

## 2023-02-20 DIAGNOSIS — Z87440 Personal history of urinary (tract) infections: Secondary | ICD-10-CM | POA: Insufficient documentation

## 2023-02-20 DIAGNOSIS — T83511A Infection and inflammatory reaction due to indwelling urethral catheter, initial encounter: Secondary | ICD-10-CM | POA: Insufficient documentation

## 2023-02-20 LAB — CBC
HCT: 40 % (ref 39.0–52.0)
Hemoglobin: 13.5 g/dL (ref 13.0–17.0)
MCH: 32 pg (ref 26.0–34.0)
MCHC: 33.8 g/dL (ref 30.0–36.0)
MCV: 94.8 fL (ref 80.0–100.0)
Platelets: 137 10*3/uL — ABNORMAL LOW (ref 150–400)
RBC: 4.22 MIL/uL (ref 4.22–5.81)
RDW: 13.8 % (ref 11.5–15.5)
WBC: 6.1 10*3/uL (ref 4.0–10.5)
nRBC: 0 % (ref 0.0–0.2)

## 2023-02-20 LAB — PROTIME-INR
INR: 1.1 (ref 0.8–1.2)
Prothrombin Time: 14 seconds (ref 11.4–15.2)

## 2023-02-20 MED ORDER — FENTANYL CITRATE (PF) 100 MCG/2ML IJ SOLN
INTRAMUSCULAR | Status: AC
  Filled 2023-02-20: qty 2

## 2023-02-20 MED ORDER — LIDOCAINE HCL 1 % IJ SOLN
10.0000 mL | Freq: Once | INTRAMUSCULAR | Status: AC
  Administered 2023-02-20: 10 mL via INTRADERMAL

## 2023-02-20 MED ORDER — HYDROCODONE-ACETAMINOPHEN 5-325 MG PO TABS: 1.0000 | ORAL_TABLET | ORAL | Status: DC | PRN

## 2023-02-20 MED ORDER — SODIUM CHLORIDE 0.9 % IV SOLN
INTRAVENOUS | Status: AC
  Filled 2023-02-20: qty 20

## 2023-02-20 MED ORDER — SODIUM CHLORIDE 0.9 % IV SOLN
2.0000 g | INTRAVENOUS | Status: AC
  Administered 2023-02-20: 2 g via INTRAVENOUS
  Filled 2023-02-20: qty 20

## 2023-02-20 MED ORDER — MIDAZOLAM HCL 2 MG/2ML IJ SOLN
INTRAMUSCULAR | Status: AC | PRN
Start: 2023-02-20 — End: 2023-02-20
  Administered 2023-02-20 (×2): .5 mg via INTRAVENOUS
  Administered 2023-02-20: 1 mg via INTRAVENOUS

## 2023-02-20 MED ORDER — FENTANYL CITRATE (PF) 100 MCG/2ML IJ SOLN
INTRAMUSCULAR | Status: AC | PRN
  Administered 2023-02-20 (×3): 25 ug via INTRAVENOUS

## 2023-02-20 MED ORDER — SODIUM CHLORIDE 0.9 % IV SOLN: INTRAVENOUS | Status: DC

## 2023-02-20 MED ORDER — MIDAZOLAM HCL 2 MG/2ML IJ SOLN
INTRAMUSCULAR | Status: AC
  Filled 2023-02-20: qty 2

## 2023-02-20 NOTE — H&P (Signed)
Chief Complaint: Patient was seen in consultation today for Supra pubic catheter placement at the request of Eskridge,Matthew  Referring Physician(s): Eskridge,Matthew  Supervising Physician: Richarda Overlie  Patient Status: [facility] - Out-pt  History of Present Illness: Timothy Spencer is a 82 y.o. male   FULL Code status per pt  Known Urinary retention  Follows with Dr Mena Goes  Pt had hip surgery Dec 2023 Has had indwelling foley catheter since then Dr now asking for supra pubic catheter placement  Scheduled now for same   Past Medical History:  Diagnosis Date   Anxiety and depression    BPH (benign prostatic hyperplasia)    CAD (coronary artery disease)    COPD (chronic obstructive pulmonary disease) (HCC)    DOE (dyspnea on exertion)    ED (erectile dysfunction)    Emphysema of lung (HCC)    First degree AV block    GERD (gastroesophageal reflux disease)    Glaucoma    Gout    Gynecomastia    H/O ETOH abuse    Hemochromatosis    Hyperlipidemia    Hypertension    Hypothyroidism    Macular degeneration    OSA (obstructive sleep apnea)    Osteopenia    Peripheral neuropathy    RLS (restless legs syndrome)    Thrombocytopenia (HCC)     Past Surgical History:  Procedure Laterality Date   CARDIAC CATHETERIZATION  03/27/2008   Medical therapy   CARDIAC CATHETERIZATION  06/03/2010   Medical theray and smoking cessation   CARDIAC CATHETERIZATION N/A 11/24/2014   Procedure: Left Heart Cath and Coronary Angiography;  Surgeon: Lennette Bihari, MD;  [facility]: [facility];  Service: Cardiovascular;  Laterality: N/A;   CARDIOPULMONARY EXERCISE TEST  02/12/2012   Peak VO2 63% of predicted   FEMUR IM NAIL Left 05/10/2022   Procedure: INTRAMEDULLARY (IM) NAIL FEMORAL;  Surgeon: Joen Laura, MD;  Location: [facility] ORS;  Service: Orthopedics;  Laterality: Left;   ORIF FEMUR FRACTURE Right    TOTAL HIP ARTHROPLASTY Left 08/05/2022   Procedure: TOTAL HIP ARTHROPLASTY;   Surgeon: Joen Laura, MD;  Location: [facility] OR;  Service: Orthopedics;  Laterality: Left;   TRANSTHORACIC ECHOCARDIOGRAM  11/28/2011   EF >55%, normal    Allergies: Hydrochlorothiazide w-spironolactone  Medications: Prior to Admission medications   Medication Sig Start Date End Date Taking? Authorizing Provider  albuterol (VENTOLIN HFA) 108 (90 Base) MCG/ACT inhaler INHALE 2 PUFFS INTO THE LUNGS EVERY 6 HOURS AS NEEDED FOR WHEEZING OR SHORTNESS OF BREATH 02/19/23  Yes Mannam, Praveen, MD  amLODipine (NORVASC) 5 MG tablet Take 1 tablet (5 mg total) by mouth daily. 08/09/22 08/09/23 Yes Meredeth Ide, MD  Budeson-Glycopyrrol-Formoterol (BREZTRI AEROSPHERE) 160-9-4.8 MCG/ACT AERO Inhale 2 puffs into the lungs in the morning and at bedtime. 11/22/21  Yes Mannam, Praveen, MD  buPROPion (WELLBUTRIN XL) 150 MG 24 hr tablet Take 150 mg by mouth every morning. 07/17/21  Yes [provider]  feeding supplement (ENSURE ENLIVE / ENSURE PLUS) LIQD Take 237 mLs by mouth 2 (two) times daily between meals. 05/13/22  Yes Dahal, Melina Schools, MD  latanoprost (XALATAN) 0.005 % ophthalmic solution Place 1 drop into both eyes at bedtime. 08/17/21  Yes [provider]  levothyroxine (SYNTHROID) 112 MCG tablet Take 112 mcg by mouth daily. 07/29/19  Yes [provider]  rOPINIRole (REQUIP) 1 MG tablet Take 1 mg by mouth 3 (three) times daily. 01/01/13  Yes [provider]  rosuvastatin (CRESTOR) 5  MG tablet Take 5 mg by mouth daily. 07/15/19  Yes [provider]  senna-docusate (SENOKOT-S) 8.6-50 MG tablet Take 1 tablet by mouth at bedtime as needed for mild constipation. 05/13/22  Yes Dahal, Melina Schools, MD  sertraline (ZOLOFT) 100 MG tablet Take 100 mg by mouth daily. 01/25/13  Yes [provider]  tamsulosin (FLOMAX) 0.4 MG CAPS capsule Take 0.4 mg by mouth daily.  01/31/13  Yes [provider]  enoxaparin (LOVENOX) 40 MG/0.4ML injection Inject 0.4 mLs (40 mg total) into  the skin daily for 26 days. 08/09/22 09/04/22  Joen Laura, MD  ipratropium-albuterol (DUONEB) 0.5-2.5 (3) MG/3ML SOLN Take 3 mLs by nebulization every 4 (four) hours as needed. Patient taking differently: Take 3 mLs by nebulization every 4 (four) hours as needed (wheezing, SOB). 01/18/22   Mannam, Colbert Coyer, MD  Vitamin D, Ergocalciferol, (DRISDOL) 1.25 MG (50000 UNIT) CAPS capsule Take 1 capsule (50,000 Units total) by mouth every 7 (seven) days. 05/13/22   Lorin Glass, MD     Family History  Adopted: Yes    Social History   Socioeconomic History   Marital status: Married    Spouse name: Not on file   Number of children: Not on file   Years of education: Not on file   Highest education level: Not on file  Occupational History   Not on file  Tobacco Use   Smoking status: Former    Current packs/day: 0.00    Average packs/day: 2.0 packs/day for 50.9 years (101.9 ttl pk-yrs)    Types: Cigarettes    Start date: 04/11/1960    Quit date: 03/19/2011    Years since quitting: 11.9   Smokeless tobacco: Never   Tobacco comments:    Quit for 1 year total   Substance and Sexual Activity   Alcohol use: No    Alcohol/week: 0.0 standard drinks of alcohol   Drug use: No   Sexual activity: Not on file  Other Topics Concern   Not on file  Social History Narrative   ** Merged History Encounter **       [facility] (03/06/17): Originally from [place], [place]. Has always lived in [place]. Has traveled to [place], [place], [place], [place], [place], & [place]. Has a dog currently. No bird exposure. No mold or hot tub exposure. Previously enjoyed riding horse   s and playing golf. Currently has no hobbies. 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    Social Determinants of Health   Financial Resource Strain: Not on file  Food Insecurity: No Food Insecurity (08/03/2022)   Hunger Vital Sign    Worried About Running Out of Food in the Last Year:  Never true    Ran Out of Food in the Last Year: Never true  Transportation Needs: No Transportation Needs (08/03/2022)   PRAPARE - Administrator, Civil Service (Medical): No    Lack of Transportation (Non-Medical): No  Physical Activity: Not on file  Stress: Not on file  Social Connections: Not on file    Review of Systems: A 12 point ROS discussed and pertinent positives are indicated in the HPI above.  All other systems are negative.  Review of Systems  Constitutional:  Negative for activity change, fatigue and fever.  Respiratory:  Negative for cough and shortness of breath.   Cardiovascular:  Negative for chest pain.  Gastrointestinal:  Negative for abdominal pain.  Neurological:  Positive for weakness.  Psychiatric/Behavioral:  Negative for behavioral problems.     Vital Signs: BP (!) 145/66   Pulse 65   Temp 98.1 F (36.7 C) (Temporal)   Resp 17   Ht 5\' 11"  (1.803 m)   Wt 150 lb (68 kg)   SpO2 98%   BMI 20.92 kg/m   Advance Care Plan: The advanced care plan/surrogate decision maker was discussed at the time of visit and documented in the medical record.    Physical Exam Vitals reviewed.  Constitutional:      Appearance: He is ill-appearing.  HENT:     Mouth/Throat:     Mouth: Mucous membranes are moist.  Cardiovascular:     Rate and Rhythm: Normal rate and regular rhythm.     Heart sounds: Normal heart sounds. No murmur heard. Pulmonary:     Effort: Pulmonary effort is normal.     Breath sounds: Normal breath sounds. No wheezing.  Abdominal:     Palpations: Abdomen is soft.  Musculoskeletal:        General: Normal range of motion.  Skin:    General: Skin is warm.     Comments: Foley catheter in place--- now clamped  Neurological:     Mental Status: He is oriented to person, place, and time.  Psychiatric:        Behavior: Behavior normal.     Imaging: No results found.  Labs:  CBC: Recent Labs    08/07/22 0316 08/08/22 0219  08/09/22 0202 02/20/23 0843  WBC 8.5 10.7* 9.0 6.1  HGB 10.5* 9.7* 9.3* 13.5  HCT 31.2* 28.5* 27.6* 40.0  PLT 143* 143* 168 137*    COAGS: No results for input(s): "INR", "APTT" in the last 8760 hours.  BMP: Recent Labs    08/06/22 0758 08/07/22 0316 08/08/22 0219 08/09/22 0202  NA 137 135 134* 135  K 4.6 4.1 3.8 3.5  CL 103 102 103 104  CO2 24 24 23 23   GLUCOSE 115* 107* 117* 121*  BUN 24* 31* 31* 31*  CALCIUM 8.8* 8.6* 8.2* 8.4*  CREATININE 1.24 1.31* 1.25* 1.09  GFRNONAA 58* 54* 57* >60    LIVER FUNCTION TESTS: Recent Labs    08/05/22 0439 08/07/22 0316 08/08/22 0219 08/09/22 0202  BILITOT 0.9 0.6 0.4 0.1*  AST 19 17 18  55*  ALT 11 9 10 30   ALKPHOS 101 91 83 87  PROT 5.7* 5.3* 5.3* 5.1*  ALBUMIN 2.7* 2.5* 2.3* 2.1*    TUMOR MARKERS: No results for input(s): "AFPTM", "CEA", "CA199", "CHROMGRNA" in the last 8760 hours.  Assessment and Plan:  Scheduled for suprapubic catheter placement Risks and benefits discussed with the patient including bleeding, infection, damage to adjacent structures, and sepsis.  All of the patient's questions were answered, patient is agreeable to proceed. Consent signed and in chart.  Thank you for this interesting consult.  I greatly enjoyed meeting JERIME GONYA and look forward to participating in their care.  A copy of this report was sent to the requesting provider on this date.  Electronically Signed: Robet Leu, PA-C 02/20/2023, 9:24 AM   I spent a total of  30 Minutes   in face to face in clinical consultation, greater than 50% of which was counseling/coordinating care for Risks and benefits discussed with the patient including bleeding, infection, damage to adjacent structures, bowel perforation/fistula connection, and sepsis.

## 2023-02-20 NOTE — Procedures (Signed)
Interventional Radiology Procedure:   Indications: Urinary retention  Procedure: CT guided suprapubic catheter placement  Findings: 16 Fr pigtail drain placed in urinary bladder.   Complications: None     EBL: Minimal  Plan: Will remove foley catheter prior to discharge.  Plan for conversion to balloon retention suprapubic catheter in 6 weeks.   Siren Porrata R. Lowella Dandy, MD  Pager: [phone]

## 2023-02-20 NOTE — Progress Notes (Signed)
82 y.o. male outpatient. History of BPH with urinary retention with chronic indwelling foley and recurrent UTI. S/p suprapubic catheter placement in IR by IR Attending Dr. Richarda Overlie.   Patient seen at bedside in short stay with wife present. Patient denies any questions or concerns at this time. Wife expressed concern regarding the bag. She states that she was under the impression that there would be a bag directly on his lower abdomen. After further discussion with the Patient and his wife recommend they speak to their Urologist regarding other options for urinary diversion. Patient and wife have an appointment scheduled on 9.26.24.  Follow up instruction given to the Patient and his wife. Order placed for conversion to balloon retention in 6 weeks time. Both patient and wife verbalized understanding.

## 2023-02-20 NOTE — Progress Notes (Signed)
Victorino Dike, NP in to see client and his wife for explanation of suprapubic catheter

## 2023-02-21 DIAGNOSIS — N401 Enlarged prostate with lower urinary tract symptoms: Secondary | ICD-10-CM | POA: Diagnosis not present

## 2023-02-21 DIAGNOSIS — R338 Other retention of urine: Secondary | ICD-10-CM | POA: Diagnosis not present

## 2023-02-23 DIAGNOSIS — J439 Emphysema, unspecified: Secondary | ICD-10-CM | POA: Diagnosis not present

## 2023-02-23 DIAGNOSIS — E43 Unspecified severe protein-calorie malnutrition: Secondary | ICD-10-CM | POA: Diagnosis not present

## 2023-02-23 DIAGNOSIS — R338 Other retention of urine: Secondary | ICD-10-CM | POA: Diagnosis not present

## 2023-02-23 DIAGNOSIS — E8801 Alpha-1-antitrypsin deficiency: Secondary | ICD-10-CM | POA: Diagnosis not present

## 2023-02-23 DIAGNOSIS — F419 Anxiety disorder, unspecified: Secondary | ICD-10-CM | POA: Diagnosis not present

## 2023-02-23 DIAGNOSIS — H409 Unspecified glaucoma: Secondary | ICD-10-CM | POA: Diagnosis not present

## 2023-02-23 DIAGNOSIS — E039 Hypothyroidism, unspecified: Secondary | ICD-10-CM | POA: Diagnosis not present

## 2023-02-23 DIAGNOSIS — K219 Gastro-esophageal reflux disease without esophagitis: Secondary | ICD-10-CM | POA: Diagnosis not present

## 2023-02-23 DIAGNOSIS — M8580 Other specified disorders of bone density and structure, unspecified site: Secondary | ICD-10-CM | POA: Diagnosis not present

## 2023-02-23 DIAGNOSIS — Q791 Other congenital malformations of diaphragm: Secondary | ICD-10-CM | POA: Diagnosis not present

## 2023-02-23 DIAGNOSIS — F32A Depression, unspecified: Secondary | ICD-10-CM | POA: Diagnosis not present

## 2023-02-23 DIAGNOSIS — H3530 Unspecified macular degeneration: Secondary | ICD-10-CM | POA: Diagnosis not present

## 2023-02-23 DIAGNOSIS — N1832 Chronic kidney disease, stage 3b: Secondary | ICD-10-CM | POA: Diagnosis not present

## 2023-02-23 DIAGNOSIS — N259 Disorder resulting from impaired renal tubular function, unspecified: Secondary | ICD-10-CM | POA: Diagnosis not present

## 2023-02-23 DIAGNOSIS — G629 Polyneuropathy, unspecified: Secondary | ICD-10-CM | POA: Diagnosis not present

## 2023-02-23 DIAGNOSIS — M109 Gout, unspecified: Secondary | ICD-10-CM | POA: Diagnosis not present

## 2023-02-23 DIAGNOSIS — I4510 Unspecified right bundle-branch block: Secondary | ICD-10-CM | POA: Diagnosis not present

## 2023-02-23 DIAGNOSIS — G2581 Restless legs syndrome: Secondary | ICD-10-CM | POA: Diagnosis not present

## 2023-02-23 DIAGNOSIS — N401 Enlarged prostate with lower urinary tract symptoms: Secondary | ICD-10-CM | POA: Diagnosis not present

## 2023-02-23 DIAGNOSIS — I440 Atrioventricular block, first degree: Secondary | ICD-10-CM | POA: Diagnosis not present

## 2023-02-23 DIAGNOSIS — S72142K Displaced intertrochanteric fracture of left femur, subsequent encounter for closed fracture with nonunion: Secondary | ICD-10-CM | POA: Diagnosis not present

## 2023-02-23 DIAGNOSIS — G4733 Obstructive sleep apnea (adult) (pediatric): Secondary | ICD-10-CM | POA: Diagnosis not present

## 2023-02-23 DIAGNOSIS — I2510 Atherosclerotic heart disease of native coronary artery without angina pectoris: Secondary | ICD-10-CM | POA: Diagnosis not present

## 2023-02-23 DIAGNOSIS — E785 Hyperlipidemia, unspecified: Secondary | ICD-10-CM | POA: Diagnosis not present

## 2023-02-23 DIAGNOSIS — I129 Hypertensive chronic kidney disease with stage 1 through stage 4 chronic kidney disease, or unspecified chronic kidney disease: Secondary | ICD-10-CM | POA: Diagnosis not present

## 2023-02-26 DIAGNOSIS — I4510 Unspecified right bundle-branch block: Secondary | ICD-10-CM | POA: Diagnosis not present

## 2023-02-26 DIAGNOSIS — Q791 Other congenital malformations of diaphragm: Secondary | ICD-10-CM | POA: Diagnosis not present

## 2023-02-26 DIAGNOSIS — H3530 Unspecified macular degeneration: Secondary | ICD-10-CM | POA: Diagnosis not present

## 2023-02-26 DIAGNOSIS — E43 Unspecified severe protein-calorie malnutrition: Secondary | ICD-10-CM | POA: Diagnosis not present

## 2023-02-26 DIAGNOSIS — E8801 Alpha-1-antitrypsin deficiency: Secondary | ICD-10-CM | POA: Diagnosis not present

## 2023-02-26 DIAGNOSIS — I2510 Atherosclerotic heart disease of native coronary artery without angina pectoris: Secondary | ICD-10-CM | POA: Diagnosis not present

## 2023-02-26 DIAGNOSIS — F419 Anxiety disorder, unspecified: Secondary | ICD-10-CM | POA: Diagnosis not present

## 2023-02-26 DIAGNOSIS — J439 Emphysema, unspecified: Secondary | ICD-10-CM | POA: Diagnosis not present

## 2023-02-26 DIAGNOSIS — S72142K Displaced intertrochanteric fracture of left femur, subsequent encounter for closed fracture with nonunion: Secondary | ICD-10-CM | POA: Diagnosis not present

## 2023-02-26 DIAGNOSIS — I440 Atrioventricular block, first degree: Secondary | ICD-10-CM | POA: Diagnosis not present

## 2023-02-26 DIAGNOSIS — E785 Hyperlipidemia, unspecified: Secondary | ICD-10-CM | POA: Diagnosis not present

## 2023-02-26 DIAGNOSIS — N1832 Chronic kidney disease, stage 3b: Secondary | ICD-10-CM | POA: Diagnosis not present

## 2023-02-26 DIAGNOSIS — G629 Polyneuropathy, unspecified: Secondary | ICD-10-CM | POA: Diagnosis not present

## 2023-02-26 DIAGNOSIS — F32A Depression, unspecified: Secondary | ICD-10-CM | POA: Diagnosis not present

## 2023-02-26 DIAGNOSIS — E039 Hypothyroidism, unspecified: Secondary | ICD-10-CM | POA: Diagnosis not present

## 2023-02-26 DIAGNOSIS — I129 Hypertensive chronic kidney disease with stage 1 through stage 4 chronic kidney disease, or unspecified chronic kidney disease: Secondary | ICD-10-CM | POA: Diagnosis not present

## 2023-03-07 DIAGNOSIS — J439 Emphysema, unspecified: Secondary | ICD-10-CM | POA: Diagnosis not present

## 2023-03-07 DIAGNOSIS — E8801 Alpha-1-antitrypsin deficiency: Secondary | ICD-10-CM | POA: Diagnosis not present

## 2023-03-07 DIAGNOSIS — S72142K Displaced intertrochanteric fracture of left femur, subsequent encounter for closed fracture with nonunion: Secondary | ICD-10-CM | POA: Diagnosis not present

## 2023-03-07 DIAGNOSIS — I440 Atrioventricular block, first degree: Secondary | ICD-10-CM | POA: Diagnosis not present

## 2023-03-07 DIAGNOSIS — H3530 Unspecified macular degeneration: Secondary | ICD-10-CM | POA: Diagnosis not present

## 2023-03-07 DIAGNOSIS — E039 Hypothyroidism, unspecified: Secondary | ICD-10-CM | POA: Diagnosis not present

## 2023-03-07 DIAGNOSIS — I129 Hypertensive chronic kidney disease with stage 1 through stage 4 chronic kidney disease, or unspecified chronic kidney disease: Secondary | ICD-10-CM | POA: Diagnosis not present

## 2023-03-07 DIAGNOSIS — F32A Depression, unspecified: Secondary | ICD-10-CM | POA: Diagnosis not present

## 2023-03-07 DIAGNOSIS — I2510 Atherosclerotic heart disease of native coronary artery without angina pectoris: Secondary | ICD-10-CM | POA: Diagnosis not present

## 2023-03-07 DIAGNOSIS — I4510 Unspecified right bundle-branch block: Secondary | ICD-10-CM | POA: Diagnosis not present

## 2023-03-07 DIAGNOSIS — G629 Polyneuropathy, unspecified: Secondary | ICD-10-CM | POA: Diagnosis not present

## 2023-03-07 DIAGNOSIS — Q791 Other congenital malformations of diaphragm: Secondary | ICD-10-CM | POA: Diagnosis not present

## 2023-03-07 DIAGNOSIS — E785 Hyperlipidemia, unspecified: Secondary | ICD-10-CM | POA: Diagnosis not present

## 2023-03-07 DIAGNOSIS — E43 Unspecified severe protein-calorie malnutrition: Secondary | ICD-10-CM | POA: Diagnosis not present

## 2023-03-07 DIAGNOSIS — N1832 Chronic kidney disease, stage 3b: Secondary | ICD-10-CM | POA: Diagnosis not present

## 2023-03-07 DIAGNOSIS — F419 Anxiety disorder, unspecified: Secondary | ICD-10-CM | POA: Diagnosis not present

## 2023-03-13 DIAGNOSIS — S72142K Displaced intertrochanteric fracture of left femur, subsequent encounter for closed fracture with nonunion: Secondary | ICD-10-CM | POA: Diagnosis not present

## 2023-03-13 DIAGNOSIS — F32A Depression, unspecified: Secondary | ICD-10-CM | POA: Diagnosis not present

## 2023-03-13 DIAGNOSIS — E8801 Alpha-1-antitrypsin deficiency: Secondary | ICD-10-CM | POA: Diagnosis not present

## 2023-03-13 DIAGNOSIS — I2510 Atherosclerotic heart disease of native coronary artery without angina pectoris: Secondary | ICD-10-CM | POA: Diagnosis not present

## 2023-03-13 DIAGNOSIS — E039 Hypothyroidism, unspecified: Secondary | ICD-10-CM | POA: Diagnosis not present

## 2023-03-13 DIAGNOSIS — G629 Polyneuropathy, unspecified: Secondary | ICD-10-CM | POA: Diagnosis not present

## 2023-03-13 DIAGNOSIS — I440 Atrioventricular block, first degree: Secondary | ICD-10-CM | POA: Diagnosis not present

## 2023-03-13 DIAGNOSIS — I129 Hypertensive chronic kidney disease with stage 1 through stage 4 chronic kidney disease, or unspecified chronic kidney disease: Secondary | ICD-10-CM | POA: Diagnosis not present

## 2023-03-13 DIAGNOSIS — F419 Anxiety disorder, unspecified: Secondary | ICD-10-CM | POA: Diagnosis not present

## 2023-03-13 DIAGNOSIS — H3530 Unspecified macular degeneration: Secondary | ICD-10-CM | POA: Diagnosis not present

## 2023-03-13 DIAGNOSIS — J439 Emphysema, unspecified: Secondary | ICD-10-CM | POA: Diagnosis not present

## 2023-03-13 DIAGNOSIS — Q791 Other congenital malformations of diaphragm: Secondary | ICD-10-CM | POA: Diagnosis not present

## 2023-03-13 DIAGNOSIS — N1832 Chronic kidney disease, stage 3b: Secondary | ICD-10-CM | POA: Diagnosis not present

## 2023-03-13 DIAGNOSIS — I4510 Unspecified right bundle-branch block: Secondary | ICD-10-CM | POA: Diagnosis not present

## 2023-03-13 DIAGNOSIS — E785 Hyperlipidemia, unspecified: Secondary | ICD-10-CM | POA: Diagnosis not present

## 2023-03-13 DIAGNOSIS — E43 Unspecified severe protein-calorie malnutrition: Secondary | ICD-10-CM | POA: Diagnosis not present

## 2023-03-15 DIAGNOSIS — R338 Other retention of urine: Secondary | ICD-10-CM | POA: Diagnosis not present

## 2023-03-15 DIAGNOSIS — I10 Essential (primary) hypertension: Secondary | ICD-10-CM | POA: Diagnosis not present

## 2023-03-19 ENCOUNTER — Telehealth (HOSPITAL_COMMUNITY): Payer: Self-pay

## 2023-03-19 DIAGNOSIS — Z23 Encounter for immunization: Secondary | ICD-10-CM | POA: Diagnosis not present

## 2023-03-19 DIAGNOSIS — M109 Gout, unspecified: Secondary | ICD-10-CM | POA: Diagnosis not present

## 2023-03-19 DIAGNOSIS — D696 Thrombocytopenia, unspecified: Secondary | ICD-10-CM | POA: Diagnosis not present

## 2023-03-19 DIAGNOSIS — J432 Centrilobular emphysema: Secondary | ICD-10-CM | POA: Diagnosis not present

## 2023-03-19 DIAGNOSIS — Z0000 Encounter for general adult medical examination without abnormal findings: Secondary | ICD-10-CM | POA: Diagnosis not present

## 2023-03-19 NOTE — Telephone Encounter (Signed)
Called [facility] to give appt info for catheter exchange on 11/4, no answer, left vm. AB

## 2023-03-21 DIAGNOSIS — N1832 Chronic kidney disease, stage 3b: Secondary | ICD-10-CM | POA: Diagnosis not present

## 2023-03-21 DIAGNOSIS — E43 Unspecified severe protein-calorie malnutrition: Secondary | ICD-10-CM | POA: Diagnosis not present

## 2023-03-21 DIAGNOSIS — I4510 Unspecified right bundle-branch block: Secondary | ICD-10-CM | POA: Diagnosis not present

## 2023-03-21 DIAGNOSIS — E039 Hypothyroidism, unspecified: Secondary | ICD-10-CM | POA: Diagnosis not present

## 2023-03-21 DIAGNOSIS — Q791 Other congenital malformations of diaphragm: Secondary | ICD-10-CM | POA: Diagnosis not present

## 2023-03-21 DIAGNOSIS — S72142K Displaced intertrochanteric fracture of left femur, subsequent encounter for closed fracture with nonunion: Secondary | ICD-10-CM | POA: Diagnosis not present

## 2023-03-21 DIAGNOSIS — F419 Anxiety disorder, unspecified: Secondary | ICD-10-CM | POA: Diagnosis not present

## 2023-03-21 DIAGNOSIS — J439 Emphysema, unspecified: Secondary | ICD-10-CM | POA: Diagnosis not present

## 2023-03-21 DIAGNOSIS — E785 Hyperlipidemia, unspecified: Secondary | ICD-10-CM | POA: Diagnosis not present

## 2023-03-21 DIAGNOSIS — I440 Atrioventricular block, first degree: Secondary | ICD-10-CM | POA: Diagnosis not present

## 2023-03-21 DIAGNOSIS — I2510 Atherosclerotic heart disease of native coronary artery without angina pectoris: Secondary | ICD-10-CM | POA: Diagnosis not present

## 2023-03-21 DIAGNOSIS — F32A Depression, unspecified: Secondary | ICD-10-CM | POA: Diagnosis not present

## 2023-03-21 DIAGNOSIS — I129 Hypertensive chronic kidney disease with stage 1 through stage 4 chronic kidney disease, or unspecified chronic kidney disease: Secondary | ICD-10-CM | POA: Diagnosis not present

## 2023-03-21 DIAGNOSIS — E8801 Alpha-1-antitrypsin deficiency: Secondary | ICD-10-CM | POA: Diagnosis not present

## 2023-03-21 DIAGNOSIS — H3530 Unspecified macular degeneration: Secondary | ICD-10-CM | POA: Diagnosis not present

## 2023-03-21 DIAGNOSIS — G629 Polyneuropathy, unspecified: Secondary | ICD-10-CM | POA: Diagnosis not present

## 2023-03-25 DIAGNOSIS — E8801 Alpha-1-antitrypsin deficiency: Secondary | ICD-10-CM | POA: Diagnosis not present

## 2023-03-25 DIAGNOSIS — F32A Depression, unspecified: Secondary | ICD-10-CM | POA: Diagnosis not present

## 2023-03-25 DIAGNOSIS — F419 Anxiety disorder, unspecified: Secondary | ICD-10-CM | POA: Diagnosis not present

## 2023-03-25 DIAGNOSIS — H409 Unspecified glaucoma: Secondary | ICD-10-CM | POA: Diagnosis not present

## 2023-03-25 DIAGNOSIS — S72142K Displaced intertrochanteric fracture of left femur, subsequent encounter for closed fracture with nonunion: Secondary | ICD-10-CM | POA: Diagnosis not present

## 2023-03-25 DIAGNOSIS — E43 Unspecified severe protein-calorie malnutrition: Secondary | ICD-10-CM | POA: Diagnosis not present

## 2023-03-25 DIAGNOSIS — G2581 Restless legs syndrome: Secondary | ICD-10-CM | POA: Diagnosis not present

## 2023-03-25 DIAGNOSIS — M8580 Other specified disorders of bone density and structure, unspecified site: Secondary | ICD-10-CM | POA: Diagnosis not present

## 2023-03-25 DIAGNOSIS — G629 Polyneuropathy, unspecified: Secondary | ICD-10-CM | POA: Diagnosis not present

## 2023-03-25 DIAGNOSIS — K219 Gastro-esophageal reflux disease without esophagitis: Secondary | ICD-10-CM | POA: Diagnosis not present

## 2023-03-25 DIAGNOSIS — I4510 Unspecified right bundle-branch block: Secondary | ICD-10-CM | POA: Diagnosis not present

## 2023-03-25 DIAGNOSIS — N1832 Chronic kidney disease, stage 3b: Secondary | ICD-10-CM | POA: Diagnosis not present

## 2023-03-25 DIAGNOSIS — H3530 Unspecified macular degeneration: Secondary | ICD-10-CM | POA: Diagnosis not present

## 2023-03-25 DIAGNOSIS — G4733 Obstructive sleep apnea (adult) (pediatric): Secondary | ICD-10-CM | POA: Diagnosis not present

## 2023-03-25 DIAGNOSIS — N259 Disorder resulting from impaired renal tubular function, unspecified: Secondary | ICD-10-CM | POA: Diagnosis not present

## 2023-03-25 DIAGNOSIS — R338 Other retention of urine: Secondary | ICD-10-CM | POA: Diagnosis not present

## 2023-03-25 DIAGNOSIS — J439 Emphysema, unspecified: Secondary | ICD-10-CM | POA: Diagnosis not present

## 2023-03-25 DIAGNOSIS — I2510 Atherosclerotic heart disease of native coronary artery without angina pectoris: Secondary | ICD-10-CM | POA: Diagnosis not present

## 2023-03-25 DIAGNOSIS — Q791 Other congenital malformations of diaphragm: Secondary | ICD-10-CM | POA: Diagnosis not present

## 2023-03-25 DIAGNOSIS — N401 Enlarged prostate with lower urinary tract symptoms: Secondary | ICD-10-CM | POA: Diagnosis not present

## 2023-03-25 DIAGNOSIS — E785 Hyperlipidemia, unspecified: Secondary | ICD-10-CM | POA: Diagnosis not present

## 2023-03-25 DIAGNOSIS — M109 Gout, unspecified: Secondary | ICD-10-CM | POA: Diagnosis not present

## 2023-03-25 DIAGNOSIS — I129 Hypertensive chronic kidney disease with stage 1 through stage 4 chronic kidney disease, or unspecified chronic kidney disease: Secondary | ICD-10-CM | POA: Diagnosis not present

## 2023-03-25 DIAGNOSIS — E039 Hypothyroidism, unspecified: Secondary | ICD-10-CM | POA: Diagnosis not present

## 2023-03-25 DIAGNOSIS — I440 Atrioventricular block, first degree: Secondary | ICD-10-CM | POA: Diagnosis not present

## 2023-03-29 DIAGNOSIS — Q791 Other congenital malformations of diaphragm: Secondary | ICD-10-CM | POA: Diagnosis not present

## 2023-03-29 DIAGNOSIS — E43 Unspecified severe protein-calorie malnutrition: Secondary | ICD-10-CM | POA: Diagnosis not present

## 2023-03-29 DIAGNOSIS — E785 Hyperlipidemia, unspecified: Secondary | ICD-10-CM | POA: Diagnosis not present

## 2023-03-29 DIAGNOSIS — F32A Depression, unspecified: Secondary | ICD-10-CM | POA: Diagnosis not present

## 2023-03-29 DIAGNOSIS — E039 Hypothyroidism, unspecified: Secondary | ICD-10-CM | POA: Diagnosis not present

## 2023-03-29 DIAGNOSIS — I129 Hypertensive chronic kidney disease with stage 1 through stage 4 chronic kidney disease, or unspecified chronic kidney disease: Secondary | ICD-10-CM | POA: Diagnosis not present

## 2023-03-29 DIAGNOSIS — F419 Anxiety disorder, unspecified: Secondary | ICD-10-CM | POA: Diagnosis not present

## 2023-03-29 DIAGNOSIS — S72142K Displaced intertrochanteric fracture of left femur, subsequent encounter for closed fracture with nonunion: Secondary | ICD-10-CM | POA: Diagnosis not present

## 2023-03-29 DIAGNOSIS — G629 Polyneuropathy, unspecified: Secondary | ICD-10-CM | POA: Diagnosis not present

## 2023-03-29 DIAGNOSIS — I440 Atrioventricular block, first degree: Secondary | ICD-10-CM | POA: Diagnosis not present

## 2023-03-29 DIAGNOSIS — H3530 Unspecified macular degeneration: Secondary | ICD-10-CM | POA: Diagnosis not present

## 2023-03-29 DIAGNOSIS — E8801 Alpha-1-antitrypsin deficiency: Secondary | ICD-10-CM | POA: Diagnosis not present

## 2023-03-29 DIAGNOSIS — N1832 Chronic kidney disease, stage 3b: Secondary | ICD-10-CM | POA: Diagnosis not present

## 2023-03-29 DIAGNOSIS — I2510 Atherosclerotic heart disease of native coronary artery without angina pectoris: Secondary | ICD-10-CM | POA: Diagnosis not present

## 2023-03-29 DIAGNOSIS — I4510 Unspecified right bundle-branch block: Secondary | ICD-10-CM | POA: Diagnosis not present

## 2023-03-29 DIAGNOSIS — J439 Emphysema, unspecified: Secondary | ICD-10-CM | POA: Diagnosis not present

## 2023-04-02 ENCOUNTER — Other Ambulatory Visit: Payer: Self-pay | Admitting: Physician Assistant

## 2023-04-02 ENCOUNTER — Ambulatory Visit (HOSPITAL_COMMUNITY): Payer: Medicare Other

## 2023-04-02 DIAGNOSIS — Z01818 Encounter for other preprocedural examination: Secondary | ICD-10-CM

## 2023-04-03 ENCOUNTER — Other Ambulatory Visit: Payer: Self-pay

## 2023-04-03 ENCOUNTER — Ambulatory Visit (HOSPITAL_COMMUNITY)
Admission: RE | Admit: 2023-04-03 | Discharge: 2023-04-03 | Disposition: A | Discharge status: Home / Self Care | Payer: Medicare Other | Source: Ambulatory Visit | Attending: Radiology | Admitting: Radiology

## 2023-04-03 DIAGNOSIS — T83511S Infection and inflammatory reaction due to indwelling urethral catheter, sequela: Secondary | ICD-10-CM | POA: Insufficient documentation

## 2023-04-03 DIAGNOSIS — N390 Urinary tract infection, site not specified: Secondary | ICD-10-CM | POA: Diagnosis not present

## 2023-04-03 DIAGNOSIS — Z01818 Encounter for other preprocedural examination: Secondary | ICD-10-CM

## 2023-04-03 DIAGNOSIS — Y849 Medical procedure, unspecified as the cause of abnormal reaction of the patient, or of later complication, without mention of misadventure at the time of the procedure: Secondary | ICD-10-CM | POA: Insufficient documentation

## 2023-04-03 DIAGNOSIS — Z435 Encounter for attention to cystostomy: Secondary | ICD-10-CM | POA: Insufficient documentation

## 2023-04-03 HISTORY — PX: IR CATHETER TUBE CHANGE: IMG717

## 2023-04-03 LAB — CBC
HCT: 44.4 % (ref 39.0–52.0)
Hemoglobin: 15.3 g/dL (ref 13.0–17.0)
MCH: 32.1 pg (ref 26.0–34.0)
MCHC: 34.5 g/dL (ref 30.0–36.0)
MCV: 93.1 fL (ref 80.0–100.0)
Platelets: 192 10*3/uL (ref 150–400)
RBC: 4.77 MIL/uL (ref 4.22–5.81)
RDW: 13.5 % (ref 11.5–15.5)
WBC: 8.3 10*3/uL (ref 4.0–10.5)
nRBC: 0 % (ref 0.0–0.2)

## 2023-04-03 LAB — PROTIME-INR
INR: 1 (ref 0.8–1.2)
Prothrombin Time: 13.5 s (ref 11.4–15.2)

## 2023-04-03 MED ORDER — LIDOCAINE-EPINEPHRINE 1 %-1:100000 IJ SOLN
INTRAMUSCULAR | Status: AC
  Filled 2023-04-03: qty 1

## 2023-04-03 MED ORDER — MIDAZOLAM HCL 2 MG/2ML IJ SOLN
INTRAMUSCULAR | Status: AC | PRN
Start: 2023-04-03 — End: 2023-04-03
  Administered 2023-04-03: 1 mg via INTRAVENOUS
  Administered 2023-04-03: .5 mg via INTRAVENOUS

## 2023-04-03 MED ORDER — FENTANYL CITRATE (PF) 100 MCG/2ML IJ SOLN
INTRAMUSCULAR | Status: AC
  Filled 2023-04-03: qty 2

## 2023-04-03 MED ORDER — LIDOCAINE VISCOUS HCL 2 % MT SOLN
OROMUCOSAL | Status: AC
  Filled 2023-04-03: qty 15

## 2023-04-03 MED ORDER — LIDOCAINE-EPINEPHRINE 1 %-1:100000 IJ SOLN
20.0000 mL | Freq: Once | INTRAMUSCULAR | Status: AC
  Administered 2023-04-03: 8 mL via INTRADERMAL

## 2023-04-03 MED ORDER — MIDAZOLAM HCL 2 MG/2ML IJ SOLN
INTRAMUSCULAR | Status: AC
  Filled 2023-04-03: qty 2

## 2023-04-03 MED ORDER — IOHEXOL 300 MG/ML  SOLN
50.0000 mL | Freq: Once | INTRAMUSCULAR | Status: AC | PRN
  Administered 2023-04-03: 15 mL

## 2023-04-03 MED ORDER — FENTANYL CITRATE (PF) 100 MCG/2ML IJ SOLN
INTRAMUSCULAR | Status: AC | PRN
Start: 2023-04-03 — End: 2023-04-03
  Administered 2023-04-03 (×3): 25 ug via INTRAVENOUS

## 2023-04-03 NOTE — Procedures (Signed)
Interventional Radiology Procedure Note  Procedure: Image guided drain exchange of supra-pubic catheter, with new 70F council tip balloon retention catheter to bag  Complications: None     Recommendations: - Routine drain care  - routine wound care -advance diet - 1 hr dc home - can commence with bedside exchange  Signed,  Yvone Neu. Loreta Ave, DO

## 2023-04-03 NOTE — Progress Notes (Signed)
Patient and wife was given discharge instructions. Both verbalized understanding. 

## 2023-04-03 NOTE — H&P (Signed)
Chief Complaint: Patient was seen in consultation today for suprapubic catheter exchange at the request of Dr. Langston Masker  Supervising Physician: Gilmer Mor  Patient Status: [facility] - Out-pt  History of Present Illness: Timothy Spencer is a 82 y.o. male  FULL code patient  Patient known to IR. Previously seen on 02/20/23 for initial suprapubic catheter placement. Now scheduled for exchange and upsize. Patient complains of tenderness at site with catheter manipulation. Otherwise doing well.  Follows with Dr. Alvester Morin  Past Medical History:  Diagnosis Date   Anxiety and depression    BPH (benign prostatic hyperplasia)    CAD (coronary artery disease)    COPD (chronic obstructive pulmonary disease) (HCC)    DOE (dyspnea on exertion)    ED (erectile dysfunction)    Emphysema of lung (HCC)    First degree AV block    GERD (gastroesophageal reflux disease)    Glaucoma    Gout    Gynecomastia    H/O ETOH abuse    Hemochromatosis    Hyperlipidemia    Hypertension    Hypothyroidism    Macular degeneration    OSA (obstructive sleep apnea)    Osteopenia    Peripheral neuropathy    RLS (restless legs syndrome)    Thrombocytopenia (HCC)     Past Surgical History:  Procedure Laterality Date   CARDIAC CATHETERIZATION  03/27/2008   Medical therapy   CARDIAC CATHETERIZATION  06/03/2010   Medical theray and smoking cessation   CARDIAC CATHETERIZATION N/A 11/24/2014   Procedure: Left Heart Cath and Coronary Angiography;  Surgeon: Lennette Bihari, MD;  [facility]: [facility];  Service: Cardiovascular;  Laterality: N/A;   CARDIOPULMONARY EXERCISE TEST  02/12/2012   Peak VO2 63% of predicted   FEMUR IM NAIL Left 05/10/2022   Procedure: INTRAMEDULLARY (IM) NAIL FEMORAL;  Surgeon: Joen Laura, MD;  Location: [facility] ORS;  Service: Orthopedics;  Laterality: Left;   ORIF FEMUR FRACTURE Right    TOTAL HIP ARTHROPLASTY Left 08/05/2022   Procedure: TOTAL HIP ARTHROPLASTY;  Surgeon:  Joen Laura, MD;  Location: [facility] OR;  Service: Orthopedics;  Laterality: Left;   TRANSTHORACIC ECHOCARDIOGRAM  11/28/2011   EF >55%, normal    Allergies: Hydrochlorothiazide w-spironolactone  Medications: Prior to Admission medications   Medication Sig Start Date End Date Taking? Authorizing Provider  albuterol (VENTOLIN HFA) 108 (90 Base) MCG/ACT inhaler INHALE 2 PUFFS INTO THE LUNGS EVERY 6 HOURS AS NEEDED FOR WHEEZING OR SHORTNESS OF BREATH 02/19/23  Yes Mannam, Praveen, MD  amLODipine (NORVASC) 5 MG tablet Take 1 tablet (5 mg total) by mouth daily. 08/09/22 08/09/23 Yes Meredeth Ide, MD  buPROPion (WELLBUTRIN XL) 150 MG 24 hr tablet Take 150 mg by mouth every morning. 07/17/21  Yes [provider]  ipratropium-albuterol (DUONEB) 0.5-2.5 (3) MG/3ML SOLN Take 3 mLs by nebulization every 4 (four) hours as needed. Patient taking differently: Take 3 mLs by nebulization every 4 (four) hours as needed (wheezing, SOB). 01/18/22  Yes Mannam, Praveen, MD  levothyroxine (SYNTHROID) 112 MCG tablet Take 112 mcg by mouth daily. 07/29/19  Yes [provider]  rOPINIRole (REQUIP) 1 MG tablet Take 1 mg by mouth 3 (three) times daily. 01/01/13  Yes [provider]  rosuvastatin (CRESTOR) 5 MG tablet Take 5 mg by mouth daily. 07/15/19  Yes [provider]  senna-docusate (SENOKOT-S) 8.6-50 MG tablet Take 1 tablet by mouth at bedtime as needed for mild constipation. 05/13/22  Yes Dahal, Melina Schools, MD  sertraline (ZOLOFT) 100 MG tablet Take 100 mg by mouth daily. 01/25/13  Yes [provider]  tamsulosin (FLOMAX) 0.4 MG CAPS capsule Take 0.4 mg by mouth daily.  01/31/13  Yes [provider]  Vitamin D, Ergocalciferol, (DRISDOL) 1.25 MG (50000 UNIT) CAPS capsule Take 1 capsule (50,000 Units total) by mouth every 7 (seven) days. 05/13/22  Yes Dahal, Melina Schools, MD  Budeson-Glycopyrrol-Formoterol (BREZTRI AEROSPHERE) 160-9-4.8 MCG/ACT AERO Inhale 2 puffs into the lungs in  the morning and at bedtime. 11/22/21   Mannam, Colbert Coyer, MD  enoxaparin (LOVENOX) 40 MG/0.4ML injection Inject 0.4 mLs (40 mg total) into the skin daily for 26 days. 08/09/22 09/04/22  Joen Laura, MD  feeding supplement (ENSURE ENLIVE / ENSURE PLUS) LIQD Take 237 mLs by mouth 2 (two) times daily between meals. 05/13/22   Dahal, Melina Schools, MD  latanoprost (XALATAN) 0.005 % ophthalmic solution Place 1 drop into both eyes at bedtime. 08/17/21   [provider]     Family History  Adopted: Yes    Social History   Socioeconomic History   Marital status: Married    Spouse name: Not on file   Number of children: Not on file   Years of education: Not on file   Highest education level: Not on file  Occupational History   Not on file  Tobacco Use   Smoking status: Former    Current packs/day: 0.00    Average packs/day: 2.0 packs/day for 50.9 years (101.9 ttl pk-yrs)    Types: Cigarettes    Start date: 04/11/1960    Quit date: 03/19/2011    Years since quitting: 12.0   Smokeless tobacco: Never   Tobacco comments:    Quit for 1 year total   Substance and Sexual Activity   Alcohol use: No    Alcohol/week: 0.0 standard drinks of alcohol   Drug use: No   Sexual activity: Not on file  Other Topics Concern   Not on file  Social History Narrative   ** Merged History Encounter **       [facility] (03/06/17): Originally from [place], [place]. Has always lived in [place]. Has traveled to [place], [place], [place], [place], [place], & [place]. Has a dog currently. No bird exposure. No mold or hot tub exposure. Previously enjoyed riding horse   s and playing golf. Currently has no hobbies. 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    Social Determinants of Health   Financial Resource Strain: Not on file  Food Insecurity: No Food Insecurity (08/03/2022)   Hunger Vital Sign    Worried About Running Out of Food in the Last Year: Never true     Ran Out of Food in the Last Year: Never true  Transportation Needs: No Transportation Needs (08/03/2022)   PRAPARE - Administrator, Civil Service (Medical): No    Lack of Transportation (Non-Medical): No  Physical Activity: Not on file  Stress: Not on file  Social Connections: Not on file    Review of Systems: A 12 point ROS discussed and pertinent positives are indicated in the HPI above.  All other systems are negative.  Review of Systems  Constitutional:  Negative for activity change, chills and fever.  Respiratory:  Negative for shortness of breath and wheezing.   Cardiovascular:  Negative for chest pain.  Gastrointestinal:  Positive for abdominal pain.  Psychiatric/Behavioral:  Negative for behavioral problems and confusion.  Vital Signs: BP (!) 158/68   Pulse 66   Temp 97.6 F (36.4 C) (Oral)   Resp 17   Ht 5\' 11"  (1.803 m)   Wt 155 lb (70.3 kg)   SpO2 99%   BMI 21.62 kg/m     Physical Exam Constitutional:      General: He is not in acute distress.    Appearance: Normal appearance.  HENT:     Mouth/Throat:     Mouth: Mucous membranes are moist.  Cardiovascular:     Rate and Rhythm: Normal rate and regular rhythm.     Heart sounds: No murmur heard. Pulmonary:     Breath sounds: Normal breath sounds. No wheezing.  Abdominal:     Tenderness: There is abdominal tenderness.     Comments: Stoma site with redness and mild discharge  Skin:    General: Skin is warm and dry.  Neurological:     Mental Status: He is oriented to person, place, and time.  Psychiatric:        Behavior: Behavior normal.        Judgment: Judgment normal.     Imaging: No results found.  Labs:  CBC: Recent Labs    08/08/22 0219 08/09/22 0202 02/20/23 0843 04/03/23 1033  WBC 10.7* 9.0 6.1 8.3  HGB 9.7* 9.3* 13.5 15.3  HCT 28.5* 27.6* 40.0 44.4  PLT 143* 168 137* 192    COAGS: Recent Labs    02/20/23 0843  INR 1.1    BMP: Recent Labs     08/06/22 0758 08/07/22 0316 08/08/22 0219 08/09/22 0202  NA 137 135 134* 135  K 4.6 4.1 3.8 3.5  CL 103 102 103 104  CO2 24 24 23 23   GLUCOSE 115* 107* 117* 121*  BUN 24* 31* 31* 31*  CALCIUM 8.8* 8.6* 8.2* 8.4*  CREATININE 1.24 1.31* 1.25* 1.09  GFRNONAA 58* 54* 57* >60    LIVER FUNCTION TESTS: Recent Labs    08/05/22 0439 08/07/22 0316 08/08/22 0219 08/09/22 0202  BILITOT 0.9 0.6 0.4 0.1*  AST 19 17 18  55*  ALT 11 9 10 30   ALKPHOS 101 91 83 87  PROT 5.7* 5.3* 5.3* 5.1*  ALBUMIN 2.7* 2.5* 2.3* 2.1*    TUMOR MARKERS: No results for input(s): "AFPTM", "CEA", "CA199", "CHROMGRNA" in the last 8760 hours.  Assessment and Plan:  Presents today for suprapubic catheter exchange and upsize in IR Patient and wife are aware of risks and benefits including but not limited to Infection, bleeding, damage to organ or surrounding structures. The are agreeable to proceed Consent signed and in chart  Thank you for this interesting consult.  I greatly enjoyed meeting Timothy Spencer and look forward to participating in their care.  A copy of this report was sent to the requesting provider on this date.  Electronically Signed: Robet Leu, PA-C 04/03/2023, 11:00 AM   I spent a total of    15 Minutes in face to face in clinical consultation, greater than 50% of which was counseling/coordinating care for suprapubic catheter exchange and upsize

## 2023-04-05 DIAGNOSIS — S72142K Displaced intertrochanteric fracture of left femur, subsequent encounter for closed fracture with nonunion: Secondary | ICD-10-CM | POA: Diagnosis not present

## 2023-04-05 DIAGNOSIS — F32A Depression, unspecified: Secondary | ICD-10-CM | POA: Diagnosis not present

## 2023-04-05 DIAGNOSIS — E8801 Alpha-1-antitrypsin deficiency: Secondary | ICD-10-CM | POA: Diagnosis not present

## 2023-04-05 DIAGNOSIS — E43 Unspecified severe protein-calorie malnutrition: Secondary | ICD-10-CM | POA: Diagnosis not present

## 2023-04-05 DIAGNOSIS — J439 Emphysema, unspecified: Secondary | ICD-10-CM | POA: Diagnosis not present

## 2023-04-05 DIAGNOSIS — I2510 Atherosclerotic heart disease of native coronary artery without angina pectoris: Secondary | ICD-10-CM | POA: Diagnosis not present

## 2023-04-05 DIAGNOSIS — E039 Hypothyroidism, unspecified: Secondary | ICD-10-CM | POA: Diagnosis not present

## 2023-04-05 DIAGNOSIS — E785 Hyperlipidemia, unspecified: Secondary | ICD-10-CM | POA: Diagnosis not present

## 2023-04-05 DIAGNOSIS — I4510 Unspecified right bundle-branch block: Secondary | ICD-10-CM | POA: Diagnosis not present

## 2023-04-05 DIAGNOSIS — I440 Atrioventricular block, first degree: Secondary | ICD-10-CM | POA: Diagnosis not present

## 2023-04-05 DIAGNOSIS — G629 Polyneuropathy, unspecified: Secondary | ICD-10-CM | POA: Diagnosis not present

## 2023-04-05 DIAGNOSIS — H3530 Unspecified macular degeneration: Secondary | ICD-10-CM | POA: Diagnosis not present

## 2023-04-05 DIAGNOSIS — N1832 Chronic kidney disease, stage 3b: Secondary | ICD-10-CM | POA: Diagnosis not present

## 2023-04-05 DIAGNOSIS — Q791 Other congenital malformations of diaphragm: Secondary | ICD-10-CM | POA: Diagnosis not present

## 2023-04-05 DIAGNOSIS — I129 Hypertensive chronic kidney disease with stage 1 through stage 4 chronic kidney disease, or unspecified chronic kidney disease: Secondary | ICD-10-CM | POA: Diagnosis not present

## 2023-04-05 DIAGNOSIS — F419 Anxiety disorder, unspecified: Secondary | ICD-10-CM | POA: Diagnosis not present

## 2023-04-09 DIAGNOSIS — I2510 Atherosclerotic heart disease of native coronary artery without angina pectoris: Secondary | ICD-10-CM | POA: Diagnosis not present

## 2023-04-09 DIAGNOSIS — H3530 Unspecified macular degeneration: Secondary | ICD-10-CM | POA: Diagnosis not present

## 2023-04-09 DIAGNOSIS — Q791 Other congenital malformations of diaphragm: Secondary | ICD-10-CM | POA: Diagnosis not present

## 2023-04-09 DIAGNOSIS — F32A Depression, unspecified: Secondary | ICD-10-CM | POA: Diagnosis not present

## 2023-04-09 DIAGNOSIS — F419 Anxiety disorder, unspecified: Secondary | ICD-10-CM | POA: Diagnosis not present

## 2023-04-09 DIAGNOSIS — E039 Hypothyroidism, unspecified: Secondary | ICD-10-CM | POA: Diagnosis not present

## 2023-04-09 DIAGNOSIS — E785 Hyperlipidemia, unspecified: Secondary | ICD-10-CM | POA: Diagnosis not present

## 2023-04-09 DIAGNOSIS — I4510 Unspecified right bundle-branch block: Secondary | ICD-10-CM | POA: Diagnosis not present

## 2023-04-09 DIAGNOSIS — S72142K Displaced intertrochanteric fracture of left femur, subsequent encounter for closed fracture with nonunion: Secondary | ICD-10-CM | POA: Diagnosis not present

## 2023-04-09 DIAGNOSIS — G629 Polyneuropathy, unspecified: Secondary | ICD-10-CM | POA: Diagnosis not present

## 2023-04-09 DIAGNOSIS — I440 Atrioventricular block, first degree: Secondary | ICD-10-CM | POA: Diagnosis not present

## 2023-04-09 DIAGNOSIS — I129 Hypertensive chronic kidney disease with stage 1 through stage 4 chronic kidney disease, or unspecified chronic kidney disease: Secondary | ICD-10-CM | POA: Diagnosis not present

## 2023-04-09 DIAGNOSIS — E8801 Alpha-1-antitrypsin deficiency: Secondary | ICD-10-CM | POA: Diagnosis not present

## 2023-04-09 DIAGNOSIS — E43 Unspecified severe protein-calorie malnutrition: Secondary | ICD-10-CM | POA: Diagnosis not present

## 2023-04-09 DIAGNOSIS — J439 Emphysema, unspecified: Secondary | ICD-10-CM | POA: Diagnosis not present

## 2023-04-09 DIAGNOSIS — N1832 Chronic kidney disease, stage 3b: Secondary | ICD-10-CM | POA: Diagnosis not present

## 2023-04-18 NOTE — Progress Notes (Deleted)
  Cardiology Office Note:  .   Date:  04/18/2023  ID:  Timothy Spencer, DOB Jul 14, 1940, MRN [id number] PCP: Merri Brunette, MD  [facility] Providers Cardiologist:  Nicki Guadalajara, MD {  History of Present Illness: .   Timothy Spencer is a 82 y.o. male with a h/o of first-degree AV block, CAD, HTN, COPD, hypothyroidism, AKI, HLD, fatigue, and exertional dyspnea.  He underwent cardiac catheterization 10/9 which showed vigorous left ventricular contractility with LVH and significant mid LAD bridging.  The vessel appeared normal during diastole and narrowed to 95% in the intramyocardial segment during systole.  He was noted to have slight progression of his circumflex with 50-60% and 30-40% proximal stenosis.  Medical management recommended. He stopped smoking 5/12. Last seen by Edd Fabian, FNP on 03/31/2022. He was stable from cardiac standpoint.   ROS: ***  Studies Reviewed: .    Echocardiogram 10/28/2021 1. Left ventricular ejection fraction, by estimation, is 60 to 65%. The  left ventricle has normal function. The left ventricle has no regional  wall motion abnormalities. Left ventricular diastolic parameters are  consistent with Grade I diastolic  dysfunction (impaired relaxation).   2. Right ventricular systolic function is normal. The right ventricular  size is normal.   3. The mitral valve is normal in structure. No evidence of mitral valve  regurgitation. No evidence of mitral stenosis.   4. The aortic valve is normal in structure. Aortic valve regurgitation is  not visualized. No aortic stenosis is present.   5. The inferior vena cava is normal in size with greater than 50%  respiratory variability, suggesting right atrial pressure of 3 mmHg.   *** EKG Interpretation Date/Time:    Ventricular Rate:    PR Interval:    QRS Duration:    QT Interval:    QTC Calculation:   R Axis:      Text Interpretation:      Physical Exam:   VS:  There were no vitals taken for  this visit.   Wt Readings from Last 3 Encounters:  04/03/23 155 lb (70.3 kg)  02/20/23 150 lb (68 kg)  01/10/23 155 lb 6.4 oz (70.5 kg)    GEN: Well nourished, well developed in no acute distress NECK: No JVD; No carotid bruits CARDIAC: ***RRR, no murmurs, rubs, gallops RESPIRATORY:  Clear to auscultation without rales, wheezing or rhonchi  ABDOMEN: Soft, non-tender, non-distended EXTREMITIES:  No edema; No deformity   ASSESSMENT AND PLAN: .   ***    {Are you ordering a CV Procedure (e.g. stress test, cath, DCCV, TEE, etc)?   Press F2        :[id number]}    Signed, Bettey Mare. Liborio Nixon, ANP, AACC

## 2023-04-20 ENCOUNTER — Ambulatory Visit: Payer: Medicare Other | Attending: Adult Health | Admitting: Adult Health

## 2023-04-20 DIAGNOSIS — G629 Polyneuropathy, unspecified: Secondary | ICD-10-CM | POA: Diagnosis not present

## 2023-04-20 DIAGNOSIS — I4510 Unspecified right bundle-branch block: Secondary | ICD-10-CM | POA: Diagnosis not present

## 2023-04-20 DIAGNOSIS — I129 Hypertensive chronic kidney disease with stage 1 through stage 4 chronic kidney disease, or unspecified chronic kidney disease: Secondary | ICD-10-CM | POA: Diagnosis not present

## 2023-04-20 DIAGNOSIS — H3530 Unspecified macular degeneration: Secondary | ICD-10-CM | POA: Diagnosis not present

## 2023-04-20 DIAGNOSIS — F419 Anxiety disorder, unspecified: Secondary | ICD-10-CM | POA: Diagnosis not present

## 2023-04-20 DIAGNOSIS — E43 Unspecified severe protein-calorie malnutrition: Secondary | ICD-10-CM | POA: Diagnosis not present

## 2023-04-20 DIAGNOSIS — N1832 Chronic kidney disease, stage 3b: Secondary | ICD-10-CM | POA: Diagnosis not present

## 2023-04-20 DIAGNOSIS — J439 Emphysema, unspecified: Secondary | ICD-10-CM | POA: Diagnosis not present

## 2023-04-20 DIAGNOSIS — E785 Hyperlipidemia, unspecified: Secondary | ICD-10-CM | POA: Diagnosis not present

## 2023-04-20 DIAGNOSIS — E8801 Alpha-1-antitrypsin deficiency: Secondary | ICD-10-CM | POA: Diagnosis not present

## 2023-04-20 DIAGNOSIS — I440 Atrioventricular block, first degree: Secondary | ICD-10-CM | POA: Diagnosis not present

## 2023-04-20 DIAGNOSIS — F32A Depression, unspecified: Secondary | ICD-10-CM | POA: Diagnosis not present

## 2023-04-20 DIAGNOSIS — E039 Hypothyroidism, unspecified: Secondary | ICD-10-CM | POA: Diagnosis not present

## 2023-04-20 DIAGNOSIS — I2510 Atherosclerotic heart disease of native coronary artery without angina pectoris: Secondary | ICD-10-CM | POA: Diagnosis not present

## 2023-04-20 DIAGNOSIS — Q791 Other congenital malformations of diaphragm: Secondary | ICD-10-CM | POA: Diagnosis not present

## 2023-04-20 DIAGNOSIS — S72142K Displaced intertrochanteric fracture of left femur, subsequent encounter for closed fracture with nonunion: Secondary | ICD-10-CM | POA: Diagnosis not present

## 2023-05-03 DIAGNOSIS — R338 Other retention of urine: Secondary | ICD-10-CM | POA: Diagnosis not present

## 2023-05-29 DIAGNOSIS — R338 Other retention of urine: Secondary | ICD-10-CM | POA: Diagnosis not present

## 2023-05-29 NOTE — Progress Notes (Addendum)
 Cardiology Office Note:    Date:  05/31/2023   ID:  Timothy Spencer, DOB 1940/12/16, MRN [id number]  PCP:  Timothy Nottingham, MD   [facility] Providers Cardiologist:  Timothy Sor, MD --> --> establish with Dr. Kate at next visit    Referring MD: Timothy Dirks, MD   Chief Complaint  Patient presents with   Follow-up  CAD  History of Present Illness:    Timothy Spencer is a 82 y.o. male with a hx of tobacco abuse quit in 2012, CAD with mid systolic bridging of his LAD which narrows 80 to 90% during systole and 0% during diastole, 50% mid AV groove circumflex stenosis and a left dominant circumflex system by heart catheterization June 2016.  No intervention at that time.  Echocardiogram 10/2021 showed an LVEF 60 to 65%, grade 1 DD, normal RV function, and no significant valvular disease.  Last seen by Dr. Sor 09/2021 - was exercising 5 days per week on the treadmill. Last seen 03/2022 following a hospitalization for AMS and diagnosed with TIA.   Suprapubic catheter in place, followed by urology.    He presents for cardiology follow up. He has no cardiac complaints. He still walks on the treadmill most days of the week. He denies presyncope, syncope, and dizziness. No palpitations or chest pain. BP borderline.     Past Medical History:  Diagnosis Date   Anxiety and depression    BPH (benign prostatic hyperplasia)    CAD (coronary artery disease)    COPD (chronic obstructive pulmonary disease) (HCC)    DOE (dyspnea on exertion)    ED (erectile dysfunction)    Emphysema of lung (HCC)    First degree AV block    GERD (gastroesophageal reflux disease)    Glaucoma    Gout    Gynecomastia    H/O ETOH abuse    Hemochromatosis    Hyperlipidemia    Hypertension    Hypothyroidism    Macular degeneration    OSA (obstructive sleep apnea)    Osteopenia    Peripheral neuropathy    RLS (restless legs syndrome)    Thrombocytopenia (HCC)     Past Surgical History:   Procedure Laterality Date   CARDIAC CATHETERIZATION  03/27/2008   Medical therapy   CARDIAC CATHETERIZATION  06/03/2010   Medical theray and smoking cessation   CARDIAC CATHETERIZATION N/A 11/24/2014   Procedure: Left Heart Cath and Coronary Angiography;  Surgeon: Timothy Spencer Sor, MD;  Location: [facility];  Service: Cardiovascular;  Laterality: N/A;   CARDIOPULMONARY EXERCISE TEST  02/12/2012   Peak VO2 63% of predicted   FEMUR IM NAIL Left 05/10/2022   Procedure: INTRAMEDULLARY (IM) NAIL FEMORAL;  Surgeon: Timothy Timothy DELENA, MD;  Location: [facility];  Service: Orthopedics;  Laterality: Left;   IR CATHETER TUBE CHANGE  04/03/2023   ORIF FEMUR FRACTURE Right    TOTAL HIP ARTHROPLASTY Left 08/05/2022   Procedure: TOTAL HIP ARTHROPLASTY;  Surgeon: Timothy Timothy DELENA, MD;  Location: [facility] OR;  Service: Orthopedics;  Laterality: Left;   TRANSTHORACIC ECHOCARDIOGRAM  11/28/2011   EF >55%, normal    Current Medications: Current Meds  Medication Sig   albuterol  (VENTOLIN  HFA) 108 (90 Base) MCG/ACT inhaler INHALE 2 PUFFS INTO THE LUNGS EVERY 6 HOURS AS NEEDED FOR WHEEZING OR SHORTNESS OF BREATH   amLODipine  (NORVASC ) 5 MG tablet Take 1 tablet (5 mg total) by mouth daily.   Budeson-Glycopyrrol-Formoterol  (BREZTRI  AEROSPHERE) 160-9-4.8 MCG/ACT AERO Inhale 2 puffs  into the lungs in the morning and at bedtime.   buPROPion  (WELLBUTRIN  XL) 150 MG 24 hr tablet Take 150 mg by mouth every morning.   feeding supplement (ENSURE ENLIVE / ENSURE PLUS) LIQD Take 237 mLs by mouth 2 (two) times daily between meals.   ipratropium-albuterol  (DUONEB) 0.5-2.5 (3) MG/3ML SOLN Take 3 mLs by nebulization every 4 (four) hours as needed. (Patient taking differently: Take 3 mLs by nebulization every 4 (four) hours as needed (wheezing, SOB).)   latanoprost  (XALATAN ) 0.005 % ophthalmic solution Place 1 drop into both eyes at bedtime.   levothyroxine  (SYNTHROID ) 112 MCG tablet Take 112 mcg by mouth daily.   rOPINIRole   (REQUIP ) 1 MG tablet Take 1 mg by mouth 3 (three) times daily.   rosuvastatin  (CRESTOR ) 5 MG tablet Take 5 mg by mouth daily.   senna-docusate (SENOKOT-S) 8.6-50 MG tablet Take 1 tablet by mouth at bedtime as needed for mild constipation.   sertraline  (ZOLOFT ) 100 MG tablet Take 100 mg by mouth daily.   tamsulosin  (FLOMAX ) 0.4 MG CAPS capsule Take 0.4 mg by mouth daily.    Vitamin D , Ergocalciferol , (DRISDOL ) 1.25 MG (50000 UNIT) CAPS capsule Take 1 capsule (50,000 Units total) by mouth every 7 (seven) days.     Allergies:   Hydrochlorothiazide w-spironolactone   Social History   Socioeconomic History   Marital status: Married    Spouse name: Not on file   Number of children: Not on file   Years of education: Not on file   Highest education level: Not on file  Occupational History   Not on file  Tobacco Use   Smoking status: Former    Current packs/day: 0.00    Average packs/day: 2.0 packs/day for 50.9 years (101.9 ttl pk-yrs)    Types: Cigarettes    Start date: 04/11/1960    Quit date: 03/19/2011    Years since quitting: 12.2   Smokeless tobacco: Never   Tobacco comments:    Quit for 1 year total   Substance and Sexual Activity   Alcohol use: No    Alcohol/week: 0.0 standard drinks of alcohol   Drug use: No   Sexual activity: Not on file  Other Topics Concern   Not on file  Social History Narrative   ** Merged History Encounter **       [facility] (03/06/17): Originally from [place], [place]. Has always lived in [place]. Has traveled to [place], [place], [place], [place], [place], & [place]. Has a dog currently. No bird exposure. No mold or hot tub exposure. Previously enjoyed riding horse   s and playing golf. Currently has no hobbies. 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    Social Drivers of Corporate Investment Banker Strain: Not on file  Food Insecurity: No Food Insecurity (08/03/2022)   Hunger Vital Sign    Worried  About Running Out of Food in the Last Year: Never true    Ran Out of Food in the Last Year: Never true  Transportation Needs: No Transportation Needs (08/03/2022)   PRAPARE - Administrator, Civil Service (Medical): No    Lack of Transportation (Non-Medical): No  Physical Activity: Not on file  Stress: Not on file  Social Connections: Not on file     Family History: The patient's family history is not on file. He was adopted.  ROS:   Please see the history of present illness.  All other systems reviewed and are negative.  EKGs/Labs/Other Studies Reviewed:    The following studies were reviewed today:  Cardiac Studies & Procedures   CARDIAC CATHETERIZATION  CARDIAC CATHETERIZATION 11/24/2014  Narrative  Mid Cx lesion, 50% stenosed.  Mid LAD to Dist LAD lesion, 90% stenosed.  Normal LV function.  2 vessel coronary obstructive disease with dynamic systolic bridging in a mid LAD intramyocardial segment which narrows to 80-90% during systole and 0% during diastole; and 50% mid AV groove circumflex stenosis and a left dominant circumflex system.  RECOMMENDATION:  Medical therapy.  Findings Coronary Findings Diagnostic  Dominance: Left  Left Anterior Descending Systolic bridging of mid LAD which dips intramyocardially and narrow up to 90% during systole and 0% during diastole; not significantly improved with IC NTG  Left Circumflex discrete tubular .  Intervention  No interventions have been documented.   STRESS TESTS  MYOCARDIAL PERFUSION IMAGING 10/28/2014  Narrative  Myocardial perfusion is abnormal. There is a large, severe fixed inferior defect. Findings consistent with prior myocardial infarction, however, there does appear to be inferior wall motion. This is an intermediate risk study. Overall left ventricular systolic function was normal. LV cavity size is normal. The left ventricular ejection fraction is hyperdynamic (>65%). There is no prior  study for comparison.  Clinical correlation is advised  ECHOCARDIOGRAM  ECHOCARDIOGRAM COMPLETE 10/28/2021  Narrative ECHOCARDIOGRAM REPORT    Patient Name:   AMMAN BARTEL Date of Exam: 10/28/2021 Medical Rec #:  [id number]       Height:       71.0 in Accession #:    [id number]      Weight:       175.0 lb Date of Birth:  1941-04-26      BSA:          1.992 m Patient Age:    80 years        BP:           135/70 mmHg Patient Gender: M               HR:           78 bpm. Exam Location:  [address]  Procedure: 2D Echo, Cardiac Doppler and Color Doppler  Indications:    R06.02 SOB  History:        Patient has prior history of Echocardiogram examinations, most recent 11/28/2011. CAD, COPD; Risk Factors:Hypertension and Dyslipidemia.  Sonographer:    Augustin Seals RDCS Referring Phys: [phone] THOMAS A KELLY  IMPRESSIONS   1. Left ventricular ejection fraction, by estimation, is 60 to 65%. The left ventricle has normal function. The left ventricle has no regional wall motion abnormalities. Left ventricular diastolic parameters are consistent with Grade I diastolic dysfunction (impaired relaxation). 2. Right ventricular systolic function is normal. The right ventricular size is normal. 3. The mitral valve is normal in structure. No evidence of mitral valve regurgitation. No evidence of mitral stenosis. 4. The aortic valve is normal in structure. Aortic valve regurgitation is not visualized. No aortic stenosis is present. 5. The inferior vena cava is normal in size with greater than 50% respiratory variability, suggesting right atrial pressure of 3 mmHg.  FINDINGS Left Ventricle: Left ventricular ejection fraction, by estimation, is 60 to 65%. The left ventricle has normal function. The left ventricle has no regional wall motion abnormalities. The left ventricular internal cavity size was normal in size. There is no left ventricular hypertrophy. Left ventricular diastolic parameters  are consistent with Grade  I diastolic dysfunction (impaired relaxation).  Right Ventricle: The right ventricular size is normal. No increase in right ventricular wall thickness. Right ventricular systolic function is normal.  Left Atrium: Left atrial size was normal in size.  Right Atrium: Right atrial size was normal in size.  Pericardium: There is no evidence of pericardial effusion.  Mitral Valve: The mitral valve is normal in structure. No evidence of mitral valve regurgitation. No evidence of mitral valve stenosis.  Tricuspid Valve: The tricuspid valve is normal in structure. Tricuspid valve regurgitation is not demonstrated. No evidence of tricuspid stenosis.  Aortic Valve: The aortic valve is normal in structure. Aortic valve regurgitation is not visualized. No aortic stenosis is present.  Pulmonic Valve: The pulmonic valve was normal in structure. Pulmonic valve regurgitation is trivial. No evidence of pulmonic stenosis.  Aorta: The aortic root is normal in size and structure.  Venous: The inferior vena cava is normal in size with greater than 50% respiratory variability, suggesting right atrial pressure of 3 mmHg.  IAS/Shunts: No atrial level shunt detected by color flow Doppler.   LEFT VENTRICLE PLAX 2D LVIDd:         3.50 cm     Diastology LVIDs:         2.20 cm     LV e' medial:    5.45 cm/s LV PW:         1.00 cm     LV E/e' medial:  13.8 LV IVS:        1.00 cm     LV e' lateral:   6.20 cm/s LVOT diam:     2.40 cm     LV E/e' lateral: 12.1 LV SV:         108 LV SV Index:   54 LVOT Area:     4.52 cm  LV Volumes (MOD) LV vol d, MOD A2C: 27.9 ml LV vol d, MOD A4C: 36.5 ml LV vol s, MOD A2C: 11.7 ml LV vol s, MOD A4C: 16.9 ml LV SV MOD A2C:     16.2 ml LV SV MOD A4C:     36.5 ml LV SV MOD BP:      19.0 ml  RIGHT VENTRICLE RV Basal diam:  2.70 cm RV S prime:     8.45 cm/s TAPSE (M-mode): 2.2 cm  LEFT ATRIUM             Index       RIGHT ATRIUM           Index LA diam:        3.00 cm 1.51 cm/m  RA Area:     8.70 cm LA Vol (A2C):   14.9 ml 7.48 ml/m  RA Volume:   13.50 ml 6.78 ml/m LA Vol (A4C):   17.6 ml 8.83 ml/m LA Biplane Vol: 16.3 ml 8.18 ml/m AORTIC VALVE LVOT Vmax:   117.00 cm/s LVOT Vmean:  79.700 cm/s LVOT VTI:    0.238 m  AORTA Ao Root diam: 3.40 cm Ao Asc diam:  3.50 cm  MITRAL VALVE MV Area (PHT): 2.68 cm    SHUNTS MV Decel Time: 283 msec    Systemic VTI:  0.24 m MV E velocity: 75.20 cm/s  Systemic Diam: 2.40 cm MV A velocity: 94.60 cm/s MV E/A ratio:  0.79  Oneil Parchment MD Electronically signed by Oneil Parchment MD Signature Date/Time: 10/28/2021/3:52:54 PM    Final              EKG  Interpretation Date/Time:  Thursday May 31 2023 13:45:27 EST Ventricular Rate:  89 PR Interval:  256 QRS Duration:  124 QT Interval:  368 QTC Calculation: 447 R Axis:   -73  Text Interpretation: Sinus rhythm with 1st degree A-V block Left axis deviation Right bundle branch block Inferior infarct , age undetermined When compared with ECG of 04-Aug-2022 11:49, No significant change was found Confirmed by Madie Slough ([id number]) on 05/31/2023 2:20:51 PM    Recent Labs: 08/09/2022: ALT 30; BUN 31; Creatinine, Ser 1.09; Potassium 3.5; Sodium 135 04/03/2023: Hemoglobin 15.3; Platelets 192  Recent Lipid Panel No results found for: CHOL, TRIG, HDL, CHOLHDL, VLDL, LDLCALC, LDLDIRECT   Risk Assessment/Calculations:                Physical Exam:    VS:  BP (!) 100/56   Pulse 89   Resp (!) 98   Ht 5' 11 (1.803 m)   Wt 162 lb (73.5 kg)   BMI 22.59 kg/m     Wt Readings from Last 3 Encounters:  05/31/23 162 lb (73.5 kg)  04/03/23 155 lb (70.3 kg)  02/20/23 150 lb (68 kg)     GEN:  Well nourished, well developed in no acute distress HEENT: Normal NECK: No JVD; No carotid bruits LYMPHATICS: No lymphadenopathy CARDIAC: RRR, no murmurs, rubs, gallops RESPIRATORY:  occasional expiratory wheezing ABDOMEN:  Soft, non-tender, non-distended MUSCULOSKELETAL:  No edema; No deformity  SKIN: Warm and dry NEUROLOGIC:  Alert and oriented x 3 PSYCHIATRIC:  Normal affect   ASSESSMENT:    1. Essential hypertension   2. Coronary artery disease involving native coronary artery of native heart without angina pectoris   3. 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 First degree AV block   5. Hyperlipidemia with target LDL less than 70   6. Alpha-1-antitrypsin 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   7. Myocardial bridge    PLAN:    In order of problems listed above:  CAD Myocardial bridge - LAD - continue medical management - ASA, amlodipine , crestor  - no longer on imdur  - no chest pain, can complete 4.0 METS - walks on the treadmill most days   Hypertension - managed in the context of myocardial bridge / CAD - BP borderline - no presyncope, syncope, or dizziness - we discussed if his BP gets any lower, we would need to stop amlodipine , hoping to keep this on given myocardial bridge   Hyperlipidemia with LDL goal < 70 LDL 68 in 2022 Continue crestor    First degree AV block - not on BB - no syncope - EKG appears with RBBB and long 1st degree HB, I do no think this is flutter, but will review with Dr. Burnard.   Follow up in 1 year - establish with Dr. Kate as Dr. Burnard is retiring.            Medication Adjustments/Labs and Tests Ordered: Current medicines are reviewed at length with the patient today.  Concerns regarding medicines are outlined above.  Orders Placed This Encounter  Procedures   EKG 12-Lead   No orders of the defined types were placed in this encounter.   Patient Instructions  Medication Instructions:  The current medical regimen is effective;  continue present plan and medications as directed. Please refer to the Current Medication list given to you today.  *If you need a refill on your cardiac medications before your next appointment, please  call your pharmacy*  Lab Work: NONE  Testing/Procedures: NONE  Follow-Up: At [facility], you and your health needs are our priority.  As part of our continuing mission to provide you with exceptional heart care, we have created designated Provider Care Teams.  These Care Teams include your primary Cardiologist (physician) and Advanced Practice Providers (APPs -  Physician Assistants and Nurse Practitioners) who all work together to provide you with the care you need, when you need it.  Your next appointment:   12 month(s)  Provider:   LONNI NANAS, MD       Signed, Jon Garre Kasara Schomer, PA  05/31/2023 2:21 PM    [facility]

## 2023-05-31 ENCOUNTER — Ambulatory Visit: Payer: Medicare Other | Attending: Physician Assistant | Admitting: Physician Assistant

## 2023-05-31 ENCOUNTER — Encounter: Payer: Self-pay | Admitting: Physician Assistant

## 2023-05-31 VITALS — BP 100/56 | HR 89 | Resp 98 | Ht 71.0 in | Wt 162.0 lb

## 2023-05-31 DIAGNOSIS — Z148 Genetic carrier of other disease: Secondary | ICD-10-CM

## 2023-05-31 DIAGNOSIS — I2510 Atherosclerotic heart disease of native coronary artery without angina pectoris: Secondary | ICD-10-CM | POA: Diagnosis not present

## 2023-05-31 DIAGNOSIS — I440 Atrioventricular block, first degree: Secondary | ICD-10-CM | POA: Diagnosis not present

## 2023-05-31 DIAGNOSIS — I10 Essential (primary) hypertension: Secondary | ICD-10-CM

## 2023-05-31 DIAGNOSIS — Q245 Malformation of coronary vessels: Secondary | ICD-10-CM

## 2023-05-31 DIAGNOSIS — E785 Hyperlipidemia, unspecified: Secondary | ICD-10-CM

## 2023-05-31 DIAGNOSIS — I452 Bifascicular block: Secondary | ICD-10-CM | POA: Diagnosis not present

## 2023-05-31 NOTE — Patient Instructions (Signed)
 Medication Instructions:  The current medical regimen is effective;  continue present plan and medications as directed. Please refer to the Current Medication list given to you today.  *If you need a refill on your cardiac medications before your next appointment, please call your pharmacy*  Lab Work: NONE  Testing/Procedures: NONE  Follow-Up: At [facility], you and your health needs are our priority.  As part of our continuing mission to provide you with exceptional heart care, we have created designated Provider Care Teams.  These Care Teams include your primary Cardiologist (physician) and Advanced Practice Providers (APPs -  Physician Assistants and Nurse Practitioners) who all work together to provide you with the care you need, when you need it.  Your next appointment:   12 month(s)  Provider:   LONNI NANAS, MD

## 2023-06-05 DIAGNOSIS — S72142D Displaced intertrochanteric fracture of left femur, subsequent encounter for closed fracture with routine healing: Secondary | ICD-10-CM | POA: Diagnosis not present

## 2023-06-14 DIAGNOSIS — K080 Exfoliation of teeth due to systemic causes: Secondary | ICD-10-CM | POA: Diagnosis not present

## 2023-06-28 DIAGNOSIS — R338 Other retention of urine: Secondary | ICD-10-CM | POA: Diagnosis not present

## 2023-07-17 DIAGNOSIS — R260 Ataxic gait: Secondary | ICD-10-CM | POA: Diagnosis not present

## 2023-07-17 DIAGNOSIS — M545 Low back pain, unspecified: Secondary | ICD-10-CM | POA: Diagnosis not present

## 2023-07-19 DIAGNOSIS — R260 Ataxic gait: Secondary | ICD-10-CM | POA: Diagnosis not present

## 2023-07-19 DIAGNOSIS — M545 Low back pain, unspecified: Secondary | ICD-10-CM | POA: Diagnosis not present

## 2023-07-24 DIAGNOSIS — R260 Ataxic gait: Secondary | ICD-10-CM | POA: Diagnosis not present

## 2023-07-24 DIAGNOSIS — M545 Low back pain, unspecified: Secondary | ICD-10-CM | POA: Diagnosis not present

## 2023-07-26 DIAGNOSIS — R338 Other retention of urine: Secondary | ICD-10-CM | POA: Diagnosis not present

## 2023-07-30 DIAGNOSIS — R260 Ataxic gait: Secondary | ICD-10-CM | POA: Diagnosis not present

## 2023-07-30 DIAGNOSIS — M545 Low back pain, unspecified: Secondary | ICD-10-CM | POA: Diagnosis not present

## 2023-08-01 DIAGNOSIS — M545 Low back pain, unspecified: Secondary | ICD-10-CM | POA: Diagnosis not present

## 2023-08-01 DIAGNOSIS — R260 Ataxic gait: Secondary | ICD-10-CM | POA: Diagnosis not present

## 2023-08-13 DIAGNOSIS — M545 Low back pain, unspecified: Secondary | ICD-10-CM | POA: Diagnosis not present

## 2023-08-13 DIAGNOSIS — R260 Ataxic gait: Secondary | ICD-10-CM | POA: Diagnosis not present

## 2023-08-15 DIAGNOSIS — H353134 Nonexudative age-related macular degeneration, bilateral, advanced atrophic with subfoveal involvement: Secondary | ICD-10-CM | POA: Diagnosis not present

## 2023-08-15 DIAGNOSIS — H43822 Vitreomacular adhesion, left eye: Secondary | ICD-10-CM | POA: Diagnosis not present

## 2023-08-15 DIAGNOSIS — H35371 Puckering of macula, right eye: Secondary | ICD-10-CM | POA: Diagnosis not present

## 2023-08-15 DIAGNOSIS — H353222 Exudative age-related macular degeneration, left eye, with inactive choroidal neovascularization: Secondary | ICD-10-CM | POA: Diagnosis not present

## 2023-08-23 DIAGNOSIS — R338 Other retention of urine: Secondary | ICD-10-CM | POA: Diagnosis not present

## 2023-09-02 ENCOUNTER — Emergency Department (HOSPITAL_COMMUNITY)

## 2023-09-02 ENCOUNTER — Other Ambulatory Visit: Payer: Self-pay

## 2023-09-02 ENCOUNTER — Encounter (HOSPITAL_COMMUNITY): Payer: Self-pay

## 2023-09-02 ENCOUNTER — Inpatient Hospital Stay (HOSPITAL_COMMUNITY)
Admission: EM | Admit: 2023-09-02 | Discharge: 2023-09-06 | DRG: 871 | Disposition: A | Discharge status: Home / Self Care | Attending: Internal Medicine | Admitting: Internal Medicine

## 2023-09-02 DIAGNOSIS — E039 Hypothyroidism, unspecified: Secondary | ICD-10-CM | POA: Diagnosis present

## 2023-09-02 DIAGNOSIS — Z96642 Presence of left artificial hip joint: Secondary | ICD-10-CM | POA: Diagnosis not present

## 2023-09-02 DIAGNOSIS — K8020 Calculus of gallbladder without cholecystitis without obstruction: Secondary | ICD-10-CM | POA: Diagnosis not present

## 2023-09-02 DIAGNOSIS — Z87891 Personal history of nicotine dependence: Secondary | ICD-10-CM | POA: Diagnosis not present

## 2023-09-02 DIAGNOSIS — G629 Polyneuropathy, unspecified: Secondary | ICD-10-CM | POA: Diagnosis present

## 2023-09-02 DIAGNOSIS — R6521 Severe sepsis with septic shock: Secondary | ICD-10-CM

## 2023-09-02 DIAGNOSIS — A419 Sepsis, unspecified organism: Secondary | ICD-10-CM | POA: Diagnosis not present

## 2023-09-02 DIAGNOSIS — F32A Depression, unspecified: Secondary | ICD-10-CM | POA: Diagnosis not present

## 2023-09-02 DIAGNOSIS — J449 Chronic obstructive pulmonary disease, unspecified: Secondary | ICD-10-CM | POA: Diagnosis present

## 2023-09-02 DIAGNOSIS — Z6820 Body mass index (BMI) 20.0-20.9, adult: Secondary | ICD-10-CM | POA: Diagnosis not present

## 2023-09-02 DIAGNOSIS — Z79899 Other long term (current) drug therapy: Secondary | ICD-10-CM | POA: Diagnosis not present

## 2023-09-02 DIAGNOSIS — G2581 Restless legs syndrome: Secondary | ICD-10-CM | POA: Diagnosis present

## 2023-09-02 DIAGNOSIS — Z7951 Long term (current) use of inhaled steroids: Secondary | ICD-10-CM

## 2023-09-02 DIAGNOSIS — Z8673 Personal history of transient ischemic attack (TIA), and cerebral infarction without residual deficits: Secondary | ICD-10-CM

## 2023-09-02 DIAGNOSIS — Z9359 Other cystostomy status: Secondary | ICD-10-CM

## 2023-09-02 DIAGNOSIS — R0602 Shortness of breath: Secondary | ICD-10-CM | POA: Diagnosis not present

## 2023-09-02 DIAGNOSIS — Z79890 Hormone replacement therapy (postmenopausal): Secondary | ICD-10-CM | POA: Diagnosis not present

## 2023-09-02 DIAGNOSIS — F419 Anxiety disorder, unspecified: Secondary | ICD-10-CM | POA: Diagnosis present

## 2023-09-02 DIAGNOSIS — R000 Tachycardia, unspecified: Secondary | ICD-10-CM | POA: Diagnosis not present

## 2023-09-02 DIAGNOSIS — E440 Moderate protein-calorie malnutrition: Secondary | ICD-10-CM | POA: Diagnosis not present

## 2023-09-02 DIAGNOSIS — E872 Acidosis, unspecified: Secondary | ICD-10-CM | POA: Diagnosis not present

## 2023-09-02 DIAGNOSIS — I10 Essential (primary) hypertension: Secondary | ICD-10-CM | POA: Diagnosis not present

## 2023-09-02 DIAGNOSIS — K5730 Diverticulosis of large intestine without perforation or abscess without bleeding: Secondary | ICD-10-CM | POA: Diagnosis not present

## 2023-09-02 DIAGNOSIS — H409 Unspecified glaucoma: Secondary | ICD-10-CM | POA: Diagnosis present

## 2023-09-02 DIAGNOSIS — N179 Acute kidney failure, unspecified: Secondary | ICD-10-CM | POA: Diagnosis present

## 2023-09-02 DIAGNOSIS — G9341 Metabolic encephalopathy: Secondary | ICD-10-CM | POA: Diagnosis present

## 2023-09-02 DIAGNOSIS — I4510 Unspecified right bundle-branch block: Secondary | ICD-10-CM | POA: Diagnosis present

## 2023-09-02 DIAGNOSIS — N390 Urinary tract infection, site not specified: Secondary | ICD-10-CM | POA: Diagnosis present

## 2023-09-02 DIAGNOSIS — Z7901 Long term (current) use of anticoagulants: Secondary | ICD-10-CM

## 2023-09-02 DIAGNOSIS — N1831 Chronic kidney disease, stage 3a: Secondary | ICD-10-CM | POA: Diagnosis not present

## 2023-09-02 DIAGNOSIS — K4090 Unilateral inguinal hernia, without obstruction or gangrene, not specified as recurrent: Secondary | ICD-10-CM | POA: Diagnosis not present

## 2023-09-02 DIAGNOSIS — R7881 Bacteremia: Secondary | ICD-10-CM | POA: Insufficient documentation

## 2023-09-02 DIAGNOSIS — A400 Sepsis due to streptococcus, group A: Principal | ICD-10-CM | POA: Diagnosis present

## 2023-09-02 DIAGNOSIS — K219 Gastro-esophageal reflux disease without esophagitis: Secondary | ICD-10-CM | POA: Diagnosis present

## 2023-09-02 DIAGNOSIS — H3530 Unspecified macular degeneration: Secondary | ICD-10-CM | POA: Diagnosis present

## 2023-09-02 DIAGNOSIS — Z0389 Encounter for observation for other suspected diseases and conditions ruled out: Secondary | ICD-10-CM | POA: Diagnosis not present

## 2023-09-02 DIAGNOSIS — R0902 Hypoxemia: Secondary | ICD-10-CM | POA: Diagnosis not present

## 2023-09-02 DIAGNOSIS — D696 Thrombocytopenia, unspecified: Secondary | ICD-10-CM | POA: Diagnosis present

## 2023-09-02 DIAGNOSIS — E785 Hyperlipidemia, unspecified: Secondary | ICD-10-CM | POA: Diagnosis not present

## 2023-09-02 DIAGNOSIS — R9389 Abnormal findings on diagnostic imaging of other specified body structures: Secondary | ICD-10-CM | POA: Diagnosis not present

## 2023-09-02 DIAGNOSIS — D631 Anemia in chronic kidney disease: Secondary | ICD-10-CM | POA: Diagnosis present

## 2023-09-02 DIAGNOSIS — B955 Unspecified streptococcus as the cause of diseases classified elsewhere: Secondary | ICD-10-CM | POA: Diagnosis not present

## 2023-09-02 DIAGNOSIS — N3000 Acute cystitis without hematuria: Secondary | ICD-10-CM | POA: Diagnosis not present

## 2023-09-02 DIAGNOSIS — Z8709 Personal history of other diseases of the respiratory system: Secondary | ICD-10-CM

## 2023-09-02 DIAGNOSIS — M8580 Other specified disorders of bone density and structure, unspecified site: Secondary | ICD-10-CM | POA: Diagnosis present

## 2023-09-02 DIAGNOSIS — R6520 Severe sepsis without septic shock: Secondary | ICD-10-CM | POA: Diagnosis not present

## 2023-09-02 DIAGNOSIS — J439 Emphysema, unspecified: Secondary | ICD-10-CM | POA: Diagnosis not present

## 2023-09-02 DIAGNOSIS — Z888 Allergy status to other drugs, medicaments and biological substances status: Secondary | ICD-10-CM

## 2023-09-02 DIAGNOSIS — I440 Atrioventricular block, first degree: Secondary | ICD-10-CM | POA: Diagnosis present

## 2023-09-02 DIAGNOSIS — G4733 Obstructive sleep apnea (adult) (pediatric): Secondary | ICD-10-CM | POA: Diagnosis not present

## 2023-09-02 DIAGNOSIS — I2510 Atherosclerotic heart disease of native coronary artery without angina pectoris: Secondary | ICD-10-CM | POA: Diagnosis present

## 2023-09-02 DIAGNOSIS — D6959 Other secondary thrombocytopenia: Secondary | ICD-10-CM | POA: Diagnosis not present

## 2023-09-02 DIAGNOSIS — I129 Hypertensive chronic kidney disease with stage 1 through stage 4 chronic kidney disease, or unspecified chronic kidney disease: Secondary | ICD-10-CM | POA: Diagnosis present

## 2023-09-02 DIAGNOSIS — R0689 Other abnormalities of breathing: Secondary | ICD-10-CM | POA: Diagnosis not present

## 2023-09-02 DIAGNOSIS — N400 Enlarged prostate without lower urinary tract symptoms: Secondary | ICD-10-CM | POA: Diagnosis not present

## 2023-09-02 DIAGNOSIS — Z9861 Coronary angioplasty status: Secondary | ICD-10-CM

## 2023-09-02 DIAGNOSIS — I2583 Coronary atherosclerosis due to lipid rich plaque: Secondary | ICD-10-CM | POA: Diagnosis not present

## 2023-09-02 DIAGNOSIS — B950 Streptococcus, group A, as the cause of diseases classified elsewhere: Secondary | ICD-10-CM

## 2023-09-02 DIAGNOSIS — I700 Atherosclerosis of aorta: Secondary | ICD-10-CM | POA: Diagnosis not present

## 2023-09-02 DIAGNOSIS — N281 Cyst of kidney, acquired: Secondary | ICD-10-CM | POA: Diagnosis not present

## 2023-09-02 LAB — COMPREHENSIVE METABOLIC PANEL WITH GFR
ALT: 28 U/L (ref 0–44)
AST: 45 U/L — ABNORMAL HIGH (ref 15–41)
Albumin: 3.2 g/dL — ABNORMAL LOW (ref 3.5–5.0)
Alkaline Phosphatase: 70 U/L (ref 38–126)
Anion gap: 12 (ref 5–15)
BUN: 32 mg/dL — ABNORMAL HIGH (ref 8–23)
CO2: 21 mmol/L — ABNORMAL LOW (ref 22–32)
Calcium: 8.9 mg/dL (ref 8.9–10.3)
Chloride: 104 mmol/L (ref 98–111)
Creatinine, Ser: 1.83 mg/dL — ABNORMAL HIGH (ref 0.61–1.24)
GFR, Estimated: 36 mL/min — ABNORMAL LOW (ref >60)
Glucose, Bld: 128 mg/dL — ABNORMAL HIGH (ref 70–99)
Potassium: 4.1 mmol/L (ref 3.5–5.1)
Sodium: 137 mmol/L (ref 135–145)
Total Bilirubin: 1.5 mg/dL — ABNORMAL HIGH (ref 0.0–1.2)
Total Protein: 6.3 g/dL — ABNORMAL LOW (ref 6.5–8.1)

## 2023-09-02 LAB — CBC WITH DIFFERENTIAL/PLATELET
Abs Immature Granulocytes: 0.31 10*3/uL — ABNORMAL HIGH (ref 0.00–0.07)
Basophils Absolute: 0 10*3/uL (ref 0.0–0.1)
Basophils Relative: 0 %
Eosinophils Absolute: 0 10*3/uL (ref 0.0–0.5)
Eosinophils Relative: 0 %
HCT: 39.7 % (ref 39.0–52.0)
Hemoglobin: 13.5 g/dL (ref 13.0–17.0)
Immature Granulocytes: 2 %
Lymphocytes Relative: 4 %
Lymphs Abs: 0.7 10*3/uL (ref 0.7–4.0)
MCH: 32.3 pg (ref 26.0–34.0)
MCHC: 34 g/dL (ref 30.0–36.0)
MCV: 95 fL (ref 80.0–100.0)
Monocytes Absolute: 1 10*3/uL (ref 0.1–1.0)
Monocytes Relative: 6 %
Neutro Abs: 15.9 10*3/uL — ABNORMAL HIGH (ref 1.7–7.7)
Neutrophils Relative %: 88 %
Platelets: 119 10*3/uL — ABNORMAL LOW (ref 150–400)
RBC: 4.18 MIL/uL — ABNORMAL LOW (ref 4.22–5.81)
RDW: 14.4 % (ref 11.5–15.5)
WBC: 18 10*3/uL — ABNORMAL HIGH (ref 4.0–10.5)
nRBC: 0 % (ref 0.0–0.2)

## 2023-09-02 LAB — URINALYSIS, W/ REFLEX TO CULTURE (INFECTION SUSPECTED)
Bilirubin Urine: NEGATIVE
Glucose, UA: NEGATIVE mg/dL
Hgb urine dipstick: NEGATIVE
Ketones, ur: NEGATIVE mg/dL
Nitrite: NEGATIVE
Protein, ur: 100 mg/dL — AB
Specific Gravity, Urine: 1.025 (ref 1.005–1.030)
WBC, UA: >50 WBC/hpf (ref 0–5)
pH: 7 (ref 5.0–8.0)

## 2023-09-02 LAB — I-STAT CG4 LACTIC ACID, ED
Lactic Acid, Venous: 2.6 mmol/L (ref 0.5–1.9)
Lactic Acid, Venous: 2.2 mmol/L (ref 0.5–1.9)

## 2023-09-02 LAB — PROTIME-INR
INR: 1.4 — ABNORMAL HIGH (ref 0.8–1.2)
Prothrombin Time: 16.9 s — ABNORMAL HIGH (ref 11.4–15.2)

## 2023-09-02 LAB — RESP PANEL BY RT-PCR (RSV, FLU A&B, COVID)  RVPGX2
Influenza A by PCR: NEGATIVE
Influenza B by PCR: NEGATIVE
Resp Syncytial Virus by PCR: NEGATIVE
SARS Coronavirus 2 by RT PCR: NEGATIVE

## 2023-09-02 MED ORDER — LACTATED RINGERS IV BOLUS
500.0000 mL | Freq: Once | INTRAVENOUS | Status: AC
  Administered 2023-09-02: 500 mL via INTRAVENOUS

## 2023-09-02 MED ORDER — METRONIDAZOLE 500 MG/100ML IV SOLN
500.0000 mg | Freq: Once | INTRAVENOUS | Status: AC
  Administered 2023-09-02: 500 mg via INTRAVENOUS
  Filled 2023-09-02: qty 100

## 2023-09-02 MED ORDER — BISACODYL 5 MG PO TBEC: 5.0000 mg | DELAYED_RELEASE_TABLET | Freq: Every day | ORAL | Status: DC | PRN

## 2023-09-02 MED ORDER — ACETAMINOPHEN 325 MG PO TABS: 650.0000 mg | ORAL_TABLET | Freq: Four times a day (QID) | ORAL | Status: DC | PRN

## 2023-09-02 MED ORDER — ONDANSETRON HCL 4 MG PO TABS: 4.0000 mg | ORAL_TABLET | Freq: Four times a day (QID) | ORAL | Status: DC | PRN

## 2023-09-02 MED ORDER — ONDANSETRON HCL 4 MG/2ML IJ SOLN
4.0000 mg | Freq: Four times a day (QID) | INTRAMUSCULAR | Status: DC | PRN
Start: 2023-09-02 — End: 2023-09-06
  Administered 2023-09-03: 4 mg via INTRAVENOUS
  Filled 2023-09-02: qty 2

## 2023-09-02 MED ORDER — ACETAMINOPHEN 650 MG RE SUPP: 650.0000 mg | Freq: Four times a day (QID) | RECTAL | Status: DC | PRN

## 2023-09-02 MED ORDER — CEFEPIME HCL 2 G IV SOLR
2.0000 g | Freq: Once | INTRAVENOUS | Status: AC
  Administered 2023-09-02: 2 g via INTRAVENOUS
  Filled 2023-09-02: qty 12.5

## 2023-09-02 MED ORDER — LACTATED RINGERS IV SOLN: INTRAVENOUS | Status: AC

## 2023-09-02 MED ORDER — LACTATED RINGERS IV BOLUS (SEPSIS)
1000.0000 mL | Freq: Once | INTRAVENOUS | Status: AC
Start: 2023-09-02 — End: 2023-09-02
  Administered 2023-09-02: 1000 mL via INTRAVENOUS

## 2023-09-02 MED ORDER — VANCOMYCIN HCL IN DEXTROSE 1-5 GM/200ML-% IV SOLN: 1000.0000 mg | Freq: Once | INTRAVENOUS | Status: DC

## 2023-09-02 MED ORDER — SODIUM CHLORIDE 0.9 % IV SOLN: 2.0000 g | Freq: Two times a day (BID) | INTRAVENOUS | Status: DC

## 2023-09-02 MED ORDER — VANCOMYCIN HCL 1500 MG/300ML IV SOLN
1500.0000 mg | Freq: Once | INTRAVENOUS | Status: AC
  Administered 2023-09-02: 1500 mg via INTRAVENOUS
  Filled 2023-09-02: qty 300

## 2023-09-02 MED ORDER — ENSURE ENLIVE PO LIQD
237.0000 mL | Freq: Two times a day (BID) | ORAL | Status: DC
  Administered 2023-09-03 (×2): 237 mL via ORAL
  Filled 2023-09-02: qty 237

## 2023-09-02 MED ORDER — SODIUM CHLORIDE 0.9 % IV BOLUS
1000.0000 mL | Freq: Once | INTRAVENOUS | Status: AC
  Administered 2023-09-02: 1000 mL via INTRAVENOUS

## 2023-09-02 MED ORDER — MELATONIN 3 MG PO TABS
3.0000 mg | ORAL_TABLET | Freq: Every evening | ORAL | Status: DC | PRN
  Administered 2023-09-03 – 2023-09-05 (×3): 3 mg via ORAL
  Filled 2023-09-02 (×3): qty 1

## 2023-09-02 MED ORDER — ACETAMINOPHEN 500 MG PO TABS
1000.0000 mg | ORAL_TABLET | Freq: Once | ORAL | Status: AC
  Administered 2023-09-02: 1000 mg via ORAL
  Filled 2023-09-02: qty 2

## 2023-09-02 NOTE — Sepsis Progress Note (Signed)
 Following for sepsis monitoring ?

## 2023-09-02 NOTE — ED Provider Notes (Signed)
  EMERGENCY DEPARTMENT AT [facility] Provider Note   CSN: [id number] Arrival date & time: 09/02/23  1555     History  Chief Complaint  Patient presents with   Code Sepsis    Timothy Spencer is a 83 y.o. male.  HPI Patient presents from home via EMS with concern for weakness, dyspnea for 1 day.  Patient has no chest pain.  He does not wear oxygen.  EMS reports the patient complained of feeling unwell for a day, was hypoxic, 92% on room air on their arrival, also tactile fever was present.  No report of fall, trauma.  The patient self perseverates on feeling unwell, pulling with additional questioning describes difficulty breathing, and continues to deny any pain anywhere.  Patient has chronic suprapubic catheter.    Home Medications Prior to Admission medications   Medication Sig Start Date End Date Taking? Authorizing Provider  albuterol (VENTOLIN HFA) 108 (90 Base) MCG/ACT inhaler INHALE 2 PUFFS INTO THE LUNGS EVERY 6 HOURS AS NEEDED FOR WHEEZING OR SHORTNESS OF BREATH 02/19/23   Mannam, Praveen, MD  amLODipine (NORVASC) 5 MG tablet Take 1 tablet (5 mg total) by mouth daily. 08/09/22 08/09/23  Meredeth Ide, MD  Budeson-Glycopyrrol-Formoterol (BREZTRI AEROSPHERE) 160-9-4.8 MCG/ACT AERO Inhale 2 puffs into the lungs in the morning and at bedtime. 11/22/21   Mannam, Colbert Coyer, MD  buPROPion (WELLBUTRIN XL) 150 MG 24 hr tablet Take 150 mg by mouth every morning. 07/17/21   [provider]  enoxaparin (LOVENOX) 40 MG/0.4ML injection Inject 0.4 mLs (40 mg total) into the skin daily for 26 days. 08/09/22 09/04/22  Joen Laura, MD  feeding supplement (ENSURE ENLIVE / ENSURE PLUS) LIQD Take 237 mLs by mouth 2 (two) times daily between meals. 05/13/22   Dahal, Melina Schools, MD  ipratropium-albuterol (DUONEB) 0.5-2.5 (3) MG/3ML SOLN Take 3 mLs by nebulization every 4 (four) hours as needed. Patient taking differently: Take 3 mLs by nebulization every 4 (four) hours as  needed (wheezing, SOB). 01/18/22   Mannam, Colbert Coyer, MD  latanoprost (XALATAN) 0.005 % ophthalmic solution Place 1 drop into both eyes at bedtime. 08/17/21   [provider]  levothyroxine (SYNTHROID) 112 MCG tablet Take 112 mcg by mouth daily. 07/29/19   [provider]  rOPINIRole (REQUIP) 1 MG tablet Take 1 mg by mouth 3 (three) times daily. 01/01/13   [provider]  rosuvastatin (CRESTOR) 5 MG tablet Take 5 mg by mouth daily. 07/15/19   [provider]  senna-docusate (SENOKOT-S) 8.6-50 MG tablet Take 1 tablet by mouth at bedtime as needed for mild constipation. 05/13/22   Lorin Glass, MD  sertraline (ZOLOFT) 100 MG tablet Take 100 mg by mouth daily. 01/25/13   [provider]  tamsulosin (FLOMAX) 0.4 MG CAPS capsule Take 0.4 mg by mouth daily.  01/31/13   [provider]  Vitamin D, Ergocalciferol, (DRISDOL) 1.25 MG (50000 UNIT) CAPS capsule Take 1 capsule (50,000 Units total) by mouth every 7 (seven) days. 05/13/22   Lorin Glass, MD      Allergies    Hydrochlorothiazide w-spironolactone    Review of Systems   Review of Systems  Physical Exam Updated Vital Signs BP (!) 98/42   Pulse 91   Temp (!) 101 F (38.3 C) (Oral)   Resp (!) 23   Ht 6\' 2"  (1.88 m)   Wt 72.6 kg   SpO2 95%   BMI 20.54 kg/m  Physical Exam Vitals and nursing note reviewed.  Constitutional:  General: He is not in acute distress.    Appearance: He is well-developed. He is ill-appearing.  HENT:     Head: Normocephalic and atraumatic.  Eyes:     Conjunctiva/sclera: Conjunctivae normal.  Cardiovascular:     Rate and Rhythm: Regular rhythm. Tachycardia present.  Pulmonary:     Effort: Tachypnea and accessory muscle usage present.     Breath sounds: No stridor. Decreased breath sounds present.     Comments: 2 L to achieve saturation 96% abnormal Abdominal:     General: There is no distension.    Skin:    General: Skin is warm and dry.  Neurological:      Mental Status: He is alert and oriented to person, place, and time.     ED Results / Procedures / Treatments   Labs (all labs ordered are listed, but only abnormal results are displayed) Labs Reviewed  COMPREHENSIVE METABOLIC PANEL WITH GFR - Abnormal; Notable for the following components:      Result Value   CO2 21 (*)    Glucose, Bld 128 (*)    BUN 32 (*)    Creatinine, Ser 1.83 (*)    Total Protein 6.3 (*)    Albumin 3.2 (*)    AST 45 (*)    Total Bilirubin 1.5 (*)    GFR, Estimated 36 (*)    All other components within normal limits  CBC WITH DIFFERENTIAL/PLATELET - Abnormal; Notable for the following components:   WBC 18.0 (*)    RBC 4.18 (*)    Platelets 119 (*)    Neutro Abs 15.9 (*)    Abs Immature Granulocytes 0.31 (*)    All other components within normal limits  PROTIME-INR - Abnormal; Notable for the following components:   Prothrombin Time 16.9 (*)    INR 1.4 (*)    All other components within normal limits  URINALYSIS, W/ REFLEX TO CULTURE (INFECTION SUSPECTED) - Abnormal; Notable for the following components:   Color, Urine AMBER (*)    APPearance CLOUDY (*)    Protein, ur 100 (*)    Leukocytes,Ua MODERATE (*)    Bacteria, UA MANY (*)    All other components within normal limits  I-STAT CG4 LACTIC ACID, ED - Abnormal; Notable for the following components:   Lactic Acid, Venous 2.6 (*)    All other components within normal limits  I-STAT CG4 LACTIC ACID, ED - Abnormal; Notable for the following components:   Lactic Acid, Venous 2.2 (*)    All other components within normal limits  RESP PANEL BY RT-PCR (RSV, FLU A&B, COVID)  [id number]  CULTURE, BLOOD (ROUTINE X 2)  CULTURE, BLOOD (ROUTINE X 2)  URINE CULTURE    EKG EKG Interpretation Date/Time:  Sunday September 02 2023 16:04:37 EDT Ventricular Rate:  106 PR Interval:  217 QRS Duration:  136 QT Interval:  324 QTC Calculation: 431 R Axis:   -79  Text Interpretation: Sinus tachycardia Artifact  Non-specific intra-ventricular conduction delay ST-t wave abnormality Confirmed by Gerhard Munch [phone]) on 09/02/2023 4:18:30 PM  Radiology CT ABDOMEN PELVIS WO CONTRAST Result Date: 09/02/2023 CLINICAL DATA:  Sepsis EXAM: CT ABDOMEN AND PELVIS WITHOUT CONTRAST TECHNIQUE: Multidetector CT imaging of the abdomen and pelvis was performed following the standard protocol without IV contrast. RADIATION DOSE REDUCTION: This exam was performed according to the departmental dose-optimization program which includes automated exposure control, adjustment of the mA and/or kV according to patient size and/or use of iterative reconstruction technique. COMPARISON:  CT  abdomen pelvis 08/05/2019. FINDINGS: Lower chest: Atherosclerotic plaque including coronary calcification. Elevated right hemidiaphragm with right base atelectasis. No acute abnormality. Hepatobiliary: No focal liver abnormality. Calcified gallstone noted within the gallbladder lumen. No gallbladder wall thickening or pericholecystic fluid. No biliary dilatation. Pancreas: No focal lesion. Normal pancreatic contour. No surrounding inflammatory changes. No main pancreatic ductal dilatation. Spleen: Normal in size without focal abnormality. Adrenals/Urinary Tract: No adrenal nodule bilaterally. No nephrolithiasis and no hydronephrosis. Fluid density lesions likely represent simple renal cysts. Simple renal cysts, in the absence of clinically indicated signs/symptoms, require no independent follow-up. No ureterolithiasis or hydroureter. Suprapubic catheter with inflated balloon and tip terminating within the urinary bladder lumen. Urinary bladder fully decompressed. Question perivesicular fat stranding. Stomach/Bowel: Stomach is within normal limits. No evidence of bowel wall thickening or dilatation. Marked colonic diverticulosis. Appendix appears normal. Vascular/Lymphatic: No abdominal aorta or iliac aneurysm. Severe atherosclerotic plaque of the aorta and its  branches. No abdominal, pelvic, or inguinal lymphadenopathy. Reproductive: No mass. Other: No intraperitoneal free fluid. No intraperitoneal free gas. No organized fluid collection. Musculoskeletal: Small fat containing left inguinal hernia. No suspicious lytic or blastic osseous lesions. No acute displaced fracture. Multilevel degenerative changes of the spine. Partially visualized total left hip arthroplasty. Partially visualized surgical hardware within the right proximal femur with findings suggestive of some removal of prior surgical hardware. IMPRESSION: 1. Suprapubic catheter in appropriate position with urinary bladder lumen fully decompressed. Question perivesicular fat stranding. Correlate for infection with urinalysis. 2. Cholelithiasis with no CT evidence of acute cholecystitis. 3. Colonic diverticulosis with no acute diverticulitis. 4.  Aortic Atherosclerosis (ICD10-I70.0). 5. Limited evaluation on this noncontrast study Electronically Signed   By: Tish Frederickson M.D.   On: 09/02/2023 20:08   DG Chest Port 1 View Result Date: 09/02/2023 CLINICAL DATA:  Possible sepsis. EXAM: PORTABLE CHEST 1 VIEW COMPARISON:  08/03/2022. FINDINGS: The heart size and mediastinal contours are within normal limits. There is atherosclerotic calcification of the aorta. Chronic elevation of the right diaphragm is noted. There is a mild atelectasis or scarring at the lung bases. No consolidation, effusion, or pneumothorax. No acute osseous abnormality. IMPRESSION: Stable chest with no active disease. Electronically Signed   By: Thornell Sartorius M.D.   On: 09/02/2023 16:54    Procedures Procedures    Medications Ordered in ED Medications  lactated ringers infusion ( Intravenous New Bag/Given 09/02/23 1626)  sodium chloride 0.9 % bolus 1,000 mL (has no administration in time range)  lactated ringers bolus 1,000 mL (0 mLs Intravenous Stopped 09/02/23 1751)  ceFEPIme (MAXIPIME) 2 g in sodium chloride 0.9 % 100 mL IVPB (0 g  Intravenous Stopped 09/02/23 1712)  metroNIDAZOLE (FLAGYL) IVPB 500 mg (0 mg Intravenous Stopped 09/02/23 1819)  vancomycin (VANCOREADY) IVPB 1500 mg/300 mL (0 mg Intravenous Stopped 09/02/23 1957)  acetaminophen (TYLENOL) tablet 1,000 mg (1,000 mg Oral Given 09/02/23 1750)    ED Course/ Medical Decision Making/ A&P                                 Medical Decision Making Elderly male from home eating signs, symptoms consistent with sepsis.  Given hypoxia, initial concern for pneumonia, though the patient also has suprapubic catheter, concern for infected external device.  Patient is awake and alert, not hypotensive which is initially somewhat reassuring.  Given tachycardia, hypoxia, patient received supplemental oxygen, broad-spectrum antibiotics, sepsis designation.  Amount and/or Complexity of Data Reviewed Independent Historian: EMS  Details: At bedside on arrival External Data Reviewed: notes. Labs: ordered. Decision-making details documented in ED Course. Radiology: ordered and independent interpretation performed. Decision-making details documented in ED Course. ECG/medicine tests: ordered and independent interpretation performed. Decision-making details documented in ED Course.  Risk OTC drugs. Prescription drug management. Decision regarding hospitalization. Diagnosis or treatment significantly limited by social determinants of health.   8:12 PM Patient now weaned from supplemental oxygen, blood pressure has diminished somewhat, the lactic acidosis has improved.   Blood pressure 100/45, MAP 61, fluids ongoing   Patient has received empiric antibiotics as above, x-ray without obvious pneumonia, urinalysis suggestive of infection.  CT without obvious obstruction, mass, or other contributory findings.  With concern for sepsis complicated by renal dysfunction patient admitted for further monitoring, management.       Final Clinical Impression(s) / ED Diagnoses Final diagnoses:   Severe sepsis (HCC)  CRITICAL CARE Performed by: Gerhard Munch Total critical care time: 35 minutes Critical care time was exclusive of separately billable procedures and treating other patients. Critical care was necessary to treat or prevent imminent or life-threatening deterioration. Critical care was time spent personally by me on the following activities: development of treatment plan with patient and/or surrogate as well as nursing, discussions with consultants, evaluation of patient's response to treatment, examination of patient, obtaining history from patient or surrogate, ordering and performing treatments and interventions, ordering and review of laboratory studies, ordering and review of radiographic studies, pulse oximetry and re-evaluation of patient's condition.    Gerhard Munch, MD 09/02/23 2017

## 2023-09-02 NOTE — H&P (Signed)
 History and Physical    Patient: Timothy Spencer:[id number] DOB: December 05, 1940 DOA: 09/02/2023 DOS: the patient was seen and examined on 09/02/2023 PCP: Merri Brunette, MD  Patient coming from: Home  Chief Complaint:  Chief Complaint  Patient presents with   Code Sepsis   HPI: Timothy Spencer is a 83 y.o. male with medical history significant for suprapubic catheter, OSA, COPD, hypertension, hemochromatosis, restless leg syndrome, peripheral neuropathy, CAD, and known right bundle branch block his wife called EMS today.  The triage report says that he had dark urine and complained of shortness of breath.  The patient says he did not feel well today but is not able to tell me anything more specific than that. In the emergency department the patient had a temperature of 103.2. He was tachycardic and tachypneic and has an elevated white blood cell count so he does meet sepsis criteria.  He also had low systolic blood pressures for the first couple hours in the emergency department in the 90s.  After aggressive IV fluid resuscitation the patient's blood pressures did improve and he will be admitted to the hospitalist service with diagnosis of sepsis and UTI.   Review of Systems: unable to review all systems due to the inability of the patient to answer questions. Past Medical History:  Diagnosis Date   Anxiety and depression    BPH (benign prostatic hyperplasia)    CAD (coronary artery disease)    COPD (chronic obstructive pulmonary disease) (HCC)    DOE (dyspnea on exertion)    ED (erectile dysfunction)    Emphysema of lung (HCC)    First degree AV block    GERD (gastroesophageal reflux disease)    Glaucoma    Gout    Gynecomastia    H/O ETOH abuse    Hemochromatosis    Hyperlipidemia    Hypertension    Hypothyroidism    Macular degeneration    OSA (obstructive sleep apnea)    Osteopenia    Peripheral neuropathy    RLS (restless legs syndrome)    Thrombocytopenia (HCC)    Past  Surgical History:  Procedure Laterality Date   CARDIAC CATHETERIZATION  03/27/2008   Medical therapy   CARDIAC CATHETERIZATION  06/03/2010   Medical theray and smoking cessation   CARDIAC CATHETERIZATION N/A 11/24/2014   Procedure: Left Heart Cath and Coronary Angiography;  Surgeon: Lennette Bihari, MD;  Location: [facility] INVASIVE CV LAB;  Service: Cardiovascular;  Laterality: N/A;   CARDIOPULMONARY EXERCISE TEST  02/12/2012   Peak VO2 63% of predicted   FEMUR IM NAIL Left 05/10/2022   Procedure: INTRAMEDULLARY (IM) NAIL FEMORAL;  Surgeon: Joen Laura, MD;  Location: [facility] ORS;  Service: Orthopedics;  Laterality: Left;   IR CATHETER TUBE CHANGE  04/03/2023   ORIF FEMUR FRACTURE Right    TOTAL HIP ARTHROPLASTY Left 08/05/2022   Procedure: TOTAL HIP ARTHROPLASTY;  Surgeon: Joen Laura, MD;  Location: [facility] OR;  Service: Orthopedics;  Laterality: Left;   TRANSTHORACIC ECHOCARDIOGRAM  11/28/2011   EF >55%, normal   Social History:  reports that he quit smoking about 12 years ago. His smoking use included cigarettes. He started smoking about 63 years ago. He has a 101.9 pack-year smoking history. He has never used smokeless tobacco. He reports that he does not drink alcohol and does not use drugs.  Allergies  Allergen Reactions   Hydrochlorothiazide W-Spironolactone     Other Reaction(s): dizziness, fainting    Family History  Adopted: Yes  Prior to Admission medications   Medication Sig Start Date End Date Taking? Authorizing Provider  albuterol (VENTOLIN HFA) 108 (90 Base) MCG/ACT inhaler INHALE 2 PUFFS INTO THE LUNGS EVERY 6 HOURS AS NEEDED FOR WHEEZING OR SHORTNESS OF BREATH 02/19/23   Mannam, Praveen, MD  amLODipine (NORVASC) 5 MG tablet Take 1 tablet (5 mg total) by mouth daily. 08/09/22 08/09/23  Meredeth Ide, MD  Budeson-Glycopyrrol-Formoterol (BREZTRI AEROSPHERE) 160-9-4.8 MCG/ACT AERO Inhale 2 puffs into the lungs in the morning and at bedtime. 11/22/21   Mannam, Colbert Coyer, MD   buPROPion (WELLBUTRIN XL) 150 MG 24 hr tablet Take 150 mg by mouth every morning. 07/17/21   [provider]  enoxaparin (LOVENOX) 40 MG/0.4ML injection Inject 0.4 mLs (40 mg total) into the skin daily for 26 days. 08/09/22 09/04/22  Joen Laura, MD  feeding supplement (ENSURE ENLIVE / ENSURE PLUS) LIQD Take 237 mLs by mouth 2 (two) times daily between meals. 05/13/22   Dahal, Melina Schools, MD  ipratropium-albuterol (DUONEB) 0.5-2.5 (3) MG/3ML SOLN Take 3 mLs by nebulization every 4 (four) hours as needed. Patient taking differently: Take 3 mLs by nebulization every 4 (four) hours as needed (wheezing, SOB). 01/18/22   Mannam, Colbert Coyer, MD  latanoprost (XALATAN) 0.005 % ophthalmic solution Place 1 drop into both eyes at bedtime. 08/17/21   [provider]  levothyroxine (SYNTHROID) 112 MCG tablet Take 112 mcg by mouth daily. 07/29/19   [provider]  rOPINIRole (REQUIP) 1 MG tablet Take 1 mg by mouth 3 (three) times daily. 01/01/13   [provider]  rosuvastatin (CRESTOR) 5 MG tablet Take 5 mg by mouth daily. 07/15/19   [provider]  senna-docusate (SENOKOT-S) 8.6-50 MG tablet Take 1 tablet by mouth at bedtime as needed for mild constipation. 05/13/22   Lorin Glass, MD  sertraline (ZOLOFT) 100 MG tablet Take 100 mg by mouth daily. 01/25/13   [provider]  tamsulosin (FLOMAX) 0.4 MG CAPS capsule Take 0.4 mg by mouth daily.  01/31/13   [provider]  Vitamin D, Ergocalciferol, (DRISDOL) 1.25 MG (50000 UNIT) CAPS capsule Take 1 capsule (50,000 Units total) by mouth every 7 (seven) days. 05/13/22   Lorin Glass, MD    Physical Exam: Vitals:   09/02/23 2030 09/02/23 2115 09/02/23 2118 09/02/23 2200  BP: (!) 96/44 (!) 99/47  (!) 113/50  Pulse: 97 85  83  Resp: (!) 21 (!) 26  20  Temp:   99.4 F (37.4 C)   TempSrc:   Oral   SpO2: 94% 96%  96%  Weight:      Height:       Physical Exam:  General: No acute distress, well developed,  well nourished HEENT: Normocephalic, atraumatic, PERRL Cardiovascular: Normal rate and rhythm. Distal pulses intact. Pulmonary: Normal pulmonary effort, normal breath sounds Gastrointestinal: Nondistended abdomen, soft, non-tender, normoactive bowel sounds, suprapubic catheter Musculoskeletal:Normal ROM, no lower ext edema Lymphadenopathy: No cervical LAD. Skin: Skin is warm and dry. Neuro: No focal deficits noted, alert and appropriate PSYCH: Attentive and cooperative  Data Reviewed:  Results for orders placed or performed during the hospital encounter of 09/02/23 (from the past 24 hours)  Resp panel by RT-PCR (RSV, Flu A&B, Covid) Anterior Nasal Swab     Status: None   Collection Time: 09/02/23  4:14 PM   Specimen: Anterior Nasal Swab  Result Value Ref Range   SARS Coronavirus 2 by RT PCR NEGATIVE NEGATIVE   Influenza A by PCR NEGATIVE NEGATIVE   Influenza  B by PCR NEGATIVE NEGATIVE   Resp Syncytial Virus by PCR NEGATIVE NEGATIVE  Comprehensive metabolic panel     Status: Abnormal   Collection Time: 09/02/23  4:14 PM  Result Value Ref Range   Sodium 137 135 - 145 mmol/L   Potassium 4.1 3.5 - 5.1 mmol/L   Chloride 104 98 - 111 mmol/L   CO2 21 (L) 22 - 32 mmol/L   Glucose, Bld 128 (H) 70 - 99 mg/dL   BUN 32 (H) 8 - 23 mg/dL   Creatinine, Ser 4.09 (H) 0.61 - 1.24 mg/dL   Calcium 8.9 8.9 - 81.1 mg/dL   Total Protein 6.3 (L) 6.5 - 8.1 g/dL   Albumin 3.2 (L) 3.5 - 5.0 g/dL   AST 45 (H) 15 - 41 U/L   ALT 28 0 - 44 U/L   Alkaline Phosphatase 70 38 - 126 U/L   Total Bilirubin 1.5 (H) 0.0 - 1.2 mg/dL   GFR, Estimated 36 (L) >60 mL/min   Anion gap 12 5 - 15  CBC with Differential     Status: Abnormal   Collection Time: 09/02/23  4:14 PM  Result Value Ref Range   WBC 18.0 (H) 4.0 - 10.5 K/uL   RBC 4.18 (L) 4.22 - 5.81 MIL/uL   Hemoglobin 13.5 13.0 - 17.0 g/dL   HCT 91.4 78.2 - 95.6 %   MCV 95.0 80.0 - 100.0 fL   MCH 32.3 26.0 - 34.0 pg   MCHC 34.0 30.0 - 36.0 g/dL   RDW 21.3  08.6 - 57.8 %   Platelets 119 (L) 150 - 400 K/uL   nRBC 0.0 0.0 - 0.2 %   Neutrophils Relative % 88 %   Neutro Abs 15.9 (H) 1.7 - 7.7 K/uL   Lymphocytes Relative 4 %   Lymphs Abs 0.7 0.7 - 4.0 K/uL   Monocytes Relative 6 %   Monocytes Absolute 1.0 0.1 - 1.0 K/uL   Eosinophils Relative 0 %   Eosinophils Absolute 0.0 0.0 - 0.5 K/uL   Basophils Relative 0 %   Basophils Absolute 0.0 0.0 - 0.1 K/uL   Immature Granulocytes 2 %   Abs Immature Granulocytes 0.31 (H) 0.00 - 0.07 K/uL  Protime-INR     Status: Abnormal   Collection Time: 09/02/23  4:14 PM  Result Value Ref Range   Prothrombin Time 16.9 (H) 11.4 - 15.2 seconds   INR 1.4 (H) 0.8 - 1.2  Urinalysis, w/ Reflex to Culture (Infection Suspected) -Urine, Suprapubic     Status: Abnormal   Collection Time: 09/02/23  4:14 PM  Result Value Ref Range   Specimen Source URINE, SUPRAPUBIC    Color, Urine AMBER (A) YELLOW   APPearance CLOUDY (A) CLEAR   Specific Gravity, Urine 1.025 1.005 - 1.030   pH 7.0 5.0 - 8.0   Glucose, UA NEGATIVE NEGATIVE mg/dL   Hgb urine dipstick NEGATIVE NEGATIVE   Bilirubin Urine NEGATIVE NEGATIVE   Ketones, ur NEGATIVE NEGATIVE mg/dL   Protein, ur 469 (A) NEGATIVE mg/dL   Nitrite NEGATIVE NEGATIVE   Leukocytes,Ua MODERATE (A) NEGATIVE   RBC / HPF 0-5 0 - 5 RBC/hpf   WBC, UA >50 0 - 5 WBC/hpf   Bacteria, UA MANY (A) NONE SEEN   Squamous Epithelial / HPF 0-5 0 - 5 /HPF   Amorphous Crystal PRESENT   I-Stat Lactic Acid, ED     Status: Abnormal   Collection Time: 09/02/23  4:30 PM  Result Value Ref Range   Lactic  Acid, Venous 2.6 (HH) 0.5 - 1.9 mmol/L   Comment NOTIFIED PHYSICIAN   I-Stat Lactic Acid, ED     Status: Abnormal   Collection Time: 09/02/23  6:34 PM  Result Value Ref Range   Lactic Acid, Venous 2.2 (HH) 0.5 - 1.9 mmol/L   Comment NOTIFIED PHYSICIAN      Assessment and Plan: Sepsis Hypotension - improved UTI  - IV Maxipime - Await cultures - Patient will be admitted to the progressive  unit because of his initial hypotension - Hold Norvasc  2.  DVT prophylaxis - Lovenox 3.  Hypothyroidism - continue Synthroid 4.  Depression - continue outpatient medication    Advance Care Planning:   Code Status: Full Code  The patient names his wife is his surrogate decision maker and wants to be full code.  Consults: none  Family Communication: The patient's wife on the phone  Severity of Illness: The appropriate patient status for this patient is INPATIENT. Inpatient status is judged to be reasonable and necessary in order to provide the required intensity of service to ensure the patient's safety. The patient's presenting symptoms, physical exam findings, and initial radiographic and laboratory data in the context of their chronic comorbidities is felt to place them at high risk for further clinical deterioration. Furthermore, it is not anticipated that the patient will be medically stable for discharge from the hospital within 2 midnights of admission.   * I certify that at the point of admission it is my clinical judgment that the patient will require inpatient hospital care spanning beyond 2 midnights from the point of admission due to high intensity of service, high risk for further deterioration and high frequency of surveillance required.*  Author: Buena Irish, MD 09/02/2023 10:31 PM  For on call review [email or web address].

## 2023-09-02 NOTE — ED Notes (Signed)
 Patient transported to CT

## 2023-09-02 NOTE — ED Notes (Signed)
 Leg bag replaced, will collect clean urine sample from new bag

## 2023-09-02 NOTE — ED Triage Notes (Addendum)
 Pt from home. Wife states darker urine/ smell to urine. C/O SHOB. Hx of emphysema. Discomfort to catheter. Axox4. C/O weakness. EMS gave 4mg  of zofran and 200cc of LR

## 2023-09-03 DIAGNOSIS — I2510 Atherosclerotic heart disease of native coronary artery without angina pectoris: Secondary | ICD-10-CM

## 2023-09-03 DIAGNOSIS — I10 Essential (primary) hypertension: Secondary | ICD-10-CM

## 2023-09-03 DIAGNOSIS — G9341 Metabolic encephalopathy: Secondary | ICD-10-CM

## 2023-09-03 DIAGNOSIS — J449 Chronic obstructive pulmonary disease, unspecified: Secondary | ICD-10-CM | POA: Diagnosis not present

## 2023-09-03 DIAGNOSIS — R7881 Bacteremia: Secondary | ICD-10-CM

## 2023-09-03 DIAGNOSIS — Z9359 Other cystostomy status: Secondary | ICD-10-CM

## 2023-09-03 DIAGNOSIS — B955 Unspecified streptococcus as the cause of diseases classified elsewhere: Secondary | ICD-10-CM

## 2023-09-03 DIAGNOSIS — N400 Enlarged prostate without lower urinary tract symptoms: Secondary | ICD-10-CM

## 2023-09-03 DIAGNOSIS — A400 Sepsis due to streptococcus, group A: Secondary | ICD-10-CM | POA: Diagnosis not present

## 2023-09-03 DIAGNOSIS — I2583 Coronary atherosclerosis due to lipid rich plaque: Secondary | ICD-10-CM

## 2023-09-03 DIAGNOSIS — N1831 Chronic kidney disease, stage 3a: Secondary | ICD-10-CM

## 2023-09-03 LAB — BLOOD CULTURE ID PANEL (REFLEXED) - BCID2

## 2023-09-03 LAB — CBC
HCT: 34.2 % — ABNORMAL LOW (ref 39.0–52.0)
Hemoglobin: 11.2 g/dL — ABNORMAL LOW (ref 13.0–17.0)
MCH: 32 pg (ref 26.0–34.0)
MCHC: 32.7 g/dL (ref 30.0–36.0)
MCV: 97.7 fL (ref 80.0–100.0)
Platelets: 95 10*3/uL — ABNORMAL LOW (ref 150–400)
RBC: 3.5 MIL/uL — ABNORMAL LOW (ref 4.22–5.81)
RDW: 14.6 % (ref 11.5–15.5)
WBC: 12.1 10*3/uL — ABNORMAL HIGH (ref 4.0–10.5)
nRBC: 0 % (ref 0.0–0.2)

## 2023-09-03 LAB — BASIC METABOLIC PANEL WITH GFR
Anion gap: 9 (ref 5–15)
BUN: 30 mg/dL — ABNORMAL HIGH (ref 8–23)
CO2: 20 mmol/L — ABNORMAL LOW (ref 22–32)
Calcium: 8.3 mg/dL — ABNORMAL LOW (ref 8.9–10.3)
Chloride: 108 mmol/L (ref 98–111)
Creatinine, Ser: 1.68 mg/dL — ABNORMAL HIGH (ref 0.61–1.24)
GFR, Estimated: 40 mL/min — ABNORMAL LOW (ref >60)
Glucose, Bld: 139 mg/dL — ABNORMAL HIGH (ref 70–99)
Potassium: 3.6 mmol/L (ref 3.5–5.1)
Sodium: 137 mmol/L (ref 135–145)

## 2023-09-03 LAB — C DIFFICILE QUICK SCREEN W PCR REFLEX
C Diff antigen: NEGATIVE
C Diff interpretation: NOT DETECTED
C Diff toxin: NEGATIVE

## 2023-09-03 MED ORDER — ROSUVASTATIN CALCIUM 5 MG PO TABS
5.0000 mg | ORAL_TABLET | Freq: Every day | ORAL | Status: DC
  Administered 2023-09-03 – 2023-09-06 (×4): 5 mg via ORAL
  Filled 2023-09-03 (×4): qty 1

## 2023-09-03 MED ORDER — MOMETASONE FURO-FORMOTEROL FUM 200-5 MCG/ACT IN AERO
2.0000 | INHALATION_SPRAY | Freq: Two times a day (BID) | RESPIRATORY_TRACT | Status: DC
  Administered 2023-09-03 – 2023-09-04 (×3): 2 via RESPIRATORY_TRACT
  Filled 2023-09-03: qty 8.8

## 2023-09-03 MED ORDER — LACTATED RINGERS IV SOLN: INTRAVENOUS | Status: DC

## 2023-09-03 MED ORDER — SERTRALINE HCL 100 MG PO TABS
100.0000 mg | ORAL_TABLET | Freq: Every day | ORAL | Status: DC
Start: 2023-09-03 — End: 2023-09-03
  Administered 2023-09-03: 100 mg via ORAL
  Filled 2023-09-03: qty 1

## 2023-09-03 MED ORDER — BUDESON-GLYCOPYRROL-FORMOTEROL 160-9-4.8 MCG/ACT IN AERO
2.0000 | INHALATION_SPRAY | Freq: Two times a day (BID) | RESPIRATORY_TRACT | Status: DC
Start: 2023-09-03 — End: 2023-09-03

## 2023-09-03 MED ORDER — SERTRALINE HCL 100 MG PO TABS
200.0000 mg | ORAL_TABLET | Freq: Every day | ORAL | Status: DC
  Administered 2023-09-04: 200 mg via ORAL
  Filled 2023-09-03 (×2): qty 2

## 2023-09-03 MED ORDER — LOPERAMIDE HCL 2 MG PO CAPS
2.0000 mg | ORAL_CAPSULE | ORAL | Status: DC | PRN
  Administered 2023-09-03: 2 mg via ORAL
  Filled 2023-09-03: qty 1

## 2023-09-03 MED ORDER — ROPINIROLE HCL 0.5 MG PO TABS
1.0000 mg | ORAL_TABLET | Freq: Every day | ORAL | Status: DC
  Administered 2023-09-04 – 2023-09-05 (×2): 1 mg via ORAL
  Filled 2023-09-03 (×2): qty 2

## 2023-09-03 MED ORDER — ENSURE ENLIVE PO LIQD
237.0000 mL | Freq: Three times a day (TID) | ORAL | Status: DC
  Administered 2023-09-03 – 2023-09-05 (×6): 237 mL via ORAL

## 2023-09-03 MED ORDER — SODIUM CHLORIDE 0.9 % IV SOLN
12.5000 mg | Freq: Four times a day (QID) | INTRAVENOUS | Status: DC | PRN
  Administered 2023-09-03: 12.5 mg via INTRAVENOUS
  Filled 2023-09-03: qty 12.5

## 2023-09-03 MED ORDER — ADULT MULTIVITAMIN W/MINERALS CH
1.0000 | ORAL_TABLET | Freq: Every day | ORAL | Status: DC
  Administered 2023-09-03 – 2023-09-06 (×4): 1 via ORAL
  Filled 2023-09-03 (×4): qty 1

## 2023-09-03 MED ORDER — UMECLIDINIUM BROMIDE 62.5 MCG/ACT IN AEPB
1.0000 | INHALATION_SPRAY | Freq: Every day | RESPIRATORY_TRACT | Status: DC
Start: 2023-09-04 — End: 2023-09-04
  Filled 2023-09-03: qty 7

## 2023-09-03 MED ORDER — BUPROPION HCL ER (XL) 150 MG PO TB24
150.0000 mg | ORAL_TABLET | Freq: Every morning | ORAL | Status: DC
  Administered 2023-09-03 – 2023-09-06 (×4): 150 mg via ORAL
  Filled 2023-09-03 (×4): qty 1

## 2023-09-03 MED ORDER — LEVOTHYROXINE SODIUM 112 MCG PO TABS
112.0000 ug | ORAL_TABLET | Freq: Every day | ORAL | Status: DC
  Administered 2023-09-03 – 2023-09-06 (×4): 112 ug via ORAL
  Filled 2023-09-03 (×5): qty 1

## 2023-09-03 MED ORDER — SENNOSIDES-DOCUSATE SODIUM 8.6-50 MG PO TABS
1.0000 | ORAL_TABLET | Freq: Every day | ORAL | Status: DC
  Administered 2023-09-03 – 2023-09-05 (×3): 1 via ORAL
  Filled 2023-09-03 (×3): qty 1

## 2023-09-03 MED ORDER — TAMSULOSIN HCL 0.4 MG PO CAPS
0.4000 mg | ORAL_CAPSULE | Freq: Every day | ORAL | Status: DC
  Administered 2023-09-03 – 2023-09-06 (×4): 0.4 mg via ORAL
  Filled 2023-09-03 (×4): qty 1

## 2023-09-03 MED ORDER — LATANOPROST 0.005 % OP SOLN
1.0000 [drp] | Freq: Every day | OPHTHALMIC | Status: DC
Start: 2023-09-03 — End: 2023-09-06
  Administered 2023-09-03 – 2023-09-05 (×3): 1 [drp] via OPHTHALMIC
  Filled 2023-09-03 (×2): qty 2.5

## 2023-09-03 MED ORDER — IPRATROPIUM-ALBUTEROL 0.5-2.5 (3) MG/3ML IN SOLN: 3.0000 mL | RESPIRATORY_TRACT | Status: DC | PRN

## 2023-09-03 MED ORDER — ROPINIROLE HCL 0.5 MG PO TABS
1.0000 mg | ORAL_TABLET | Freq: Three times a day (TID) | ORAL | Status: DC
  Administered 2023-09-03 (×2): 1 mg via ORAL
  Filled 2023-09-03: qty 1
  Filled 2023-09-03: qty 2

## 2023-09-03 MED ORDER — PENICILLIN G POTASSIUM 20000000 UNITS IJ SOLR
4.0000 10*6.[IU] | Freq: Four times a day (QID) | INTRAVENOUS | Status: DC
  Administered 2023-09-03 – 2023-09-06 (×13): 4 10*6.[IU] via INTRAVENOUS
  Filled 2023-09-03 (×17): qty 4

## 2023-09-03 NOTE — Assessment & Plan Note (Signed)
 Platelets trending down - SCDs for DVT PPx

## 2023-09-03 NOTE — Consult Note (Signed)
 [facility]    Date of Admission:  09/02/2023     Total days of antibiotics 2               Reason for Consult: Group A streptococcus bacteremia  Referring Provider: Candy Sledge / [employer] Primary Care Provider: Merri Brunette, MD   ASSESSMENT:  Mr. Langhorst is an 83 year old Caucasian male with suprapubic catheter presenting with 1 day of weakness and shortness of breath and found to have Streptococcus pyogenes bacteremia.  Source is unclear with concern for suprapubic catheter as there is some tenderness on exam although no abscess on imaging.  No other potential sources of infection noted. Urine culture discordant with gram negative rods. Recheck blood cultures for clearance of bacteremia.  Will get TTE. Continue with Penicillin G. Not a candidate for linezolid with lower platelet count. Recommend replacing suprapubic catheter in the setting of bacteremia. Standard/universal precautions. Remaining medical and supportive care per Internal Medicine.   PLAN:  Continue current dose of penicillin. Blood cultures for clearance of bacteremia. TTE to check for endocarditis.  Recommend exchange of suprapubic catheter.  Monitor urine cultures for organisms - question colonization vs infection.  Therapeutic drug monitoring of renal function and adjusting antibiotics as appropriate.   Standard/universal precautions.  Remaining medical and supportive care per Internal Medicine.    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 due to UTI due to indwelling SP catheter (HCC) Active Problems:   Hypothyroid   Hyperlipidemia with target LDL less than 70   CAD (coronary artery disease)   Essential hypertension   COPD (chronic obstructive pulmonary disease) (HCC)   Thrombocytopenia (HCC)   Chronic kidney disease, stage 3a (HCC)   Acute metabolic encephalopathy   BPH with indwelling suprapubic catheter   Group A strep bacteremia    buPROPion  150 mg Oral  q morning   feeding supplement  237 mL Oral BID BM   latanoprost  1 drop Both Eyes QHS   levothyroxine  112 mcg Oral Daily   rOPINIRole  1 mg Oral TID   rosuvastatin  5 mg Oral Daily   senna-docusate  1 tablet Oral QHS   sertraline  100 mg Oral Daily   tamsulosin  0.4 mg Oral Daily     HPI: JAYMEN FETCH is a 83 y.o. male with previous medical history of suprapubic catheter, sleep apnea, COPD, hypertension, coronary artery disease and peripheral neuropathy presenting from home with dark urine and shortness of breath.  Mr. Starner presented with 1 day history of weakness and shortness of breath.  Febrile with temperature of 103.2 F on admission with leukocytosis and white blood cell count of 12,100.  Chest x-ray with no active disease.  CT abdomen/pelvis with suprapubic catheter in appropriate position within the urinary bladder and possible perivesicular fat stranding with concern for infection.  Lab work with acute kidney injury with estimated GFR of 36 and baseline around 55.  Started on broad spectrum coverage. Blood cultures positive for Streptococcus pyogenes (group A).  Urine culture obtained with 100,000 colonies per milliliter of gram-negative rods and organism identification pending.  Antibiotics narrowed to penicillin.  No new areas of pain or open wounds.   Review of Systems: Review of Systems  Constitutional:  Negative for chills, fever and weight loss.  Respiratory:  Negative for cough, shortness of breath and wheezing.   Cardiovascular:  Negative for chest pain and leg swelling.  Gastrointestinal:  Negative for abdominal pain, constipation,  diarrhea, nausea and vomiting.  Skin:  Negative for rash.     Past Medical History:  Diagnosis Date   Anxiety and depression    BPH (benign prostatic hyperplasia)    CAD (coronary artery disease)    COPD (chronic obstructive pulmonary disease) (HCC)    DOE (dyspnea on exertion)    ED (erectile dysfunction)    Emphysema of lung  (HCC)    First degree AV block    GERD (gastroesophageal reflux disease)    Glaucoma    Gout    Gynecomastia    H/O ETOH abuse    Hemochromatosis    Hyperlipidemia    Hypertension    Hypothyroidism    Macular degeneration    OSA (obstructive sleep apnea)    Osteopenia    Peripheral neuropathy    RLS (restless legs syndrome)    Thrombocytopenia (HCC)     Social History   Tobacco Use   Smoking status: Former    Current packs/day: 0.00    Average packs/day: 2.0 packs/day for 50.9 years (101.9 ttl pk-yrs)    Types: Cigarettes    Start date: 04/11/1960    Quit date: 03/19/2011    Years since quitting: 12.4   Smokeless tobacco: Never   Tobacco comments:    Quit for 1 year total   Substance Use Topics   Alcohol use: No    Alcohol/week: 0.0 standard drinks of alcohol   Drug use: No    Family History  Adopted: Yes    Allergies  Allergen Reactions   Hydrochlorothiazide W-Spironolactone     Other Reaction(s): dizziness, fainting    OBJECTIVE: Blood pressure (!) 142/62, pulse 82, temperature 99 F (37.2 C), resp. rate 18, height 6\' 2"  (1.88 m), weight 74.7 kg, SpO2 98%.  Physical Exam Constitutional:      General: He is not in acute distress.    Appearance: He is well-developed.  Cardiovascular:     Rate and Rhythm: Normal rate and regular rhythm.     Heart sounds: Normal heart sounds.  Pulmonary:     Effort: Pulmonary effort is normal.     Breath sounds: Normal breath sounds.  Genitourinary:    Comments: Mild tenderness around suprapubic catheter.  Skin:    General: Skin is warm and dry.  Neurological:     Mental Status: He is alert and oriented to person, place, and time.  Psychiatric:        Behavior: Behavior normal.        Thought Content: Thought content normal.        Judgment: Judgment normal.     Lab Results Lab Results  Component Value Date   WBC 12.1 (H) 09/03/2023   HGB 11.2 (L) 09/03/2023   HCT 34.2 (L) 09/03/2023   MCV 97.7 09/03/2023    PLT 95 (L) 09/03/2023    Lab Results  Component Value Date   CREATININE 1.68 (H) 09/03/2023   BUN 30 (H) 09/03/2023   NA 137 09/03/2023   K 3.6 09/03/2023   CL 108 09/03/2023   CO2 20 (L) 09/03/2023    Lab Results  Component Value Date   ALT 28 09/02/2023   AST 45 (H) 09/02/2023   ALKPHOS 70 09/02/2023   BILITOT 1.5 (H) 09/02/2023     Microbiology: Recent Results (from the past 240 hours)  Resp panel by RT-PCR (RSV, Flu A&B, Covid) Anterior Nasal Swab     Status: None   Collection Time: 09/02/23  4:14 PM   Specimen:  Anterior Nasal Swab  Result Value Ref Range Status   SARS Coronavirus 2 by RT PCR NEGATIVE NEGATIVE Final   Influenza A by PCR NEGATIVE NEGATIVE Final   Influenza B by PCR NEGATIVE NEGATIVE Final    Comment: (NOTE) The Xpert Xpress SARS-CoV-2/FLU/RSV plus assay is intended as an aid in the diagnosis of influenza from Nasopharyngeal swab specimens and should not be used as a sole basis for treatment. Nasal washings and aspirates are unacceptable for Xpert Xpress SARS-CoV-2/FLU/RSV testing.  Fact Sheet for Patients: BloggerCourse.com  Fact Sheet for Healthcare Providers: SeriousBroker.it  This test is not yet approved or cleared by the [place] FDA and has been authorized for detection and/or diagnosis of SARS-CoV-2 by FDA under an Emergency Use Authorization (EUA). This EUA will remain in effect (meaning this test can be used) for the duration of the COVID-19 declaration under Section 564(b)(1) of the Act, 21 U.S.C. section 360bbb-3(b)(1), unless the authorization is terminated or revoked.     Resp Syncytial Virus by PCR NEGATIVE NEGATIVE Final    Comment: (NOTE) Fact Sheet for Patients: BloggerCourse.com  Fact Sheet for Healthcare Providers: SeriousBroker.it  This test is not yet approved or cleared by the [place] FDA and has been  authorized for detection and/or diagnosis of SARS-CoV-2 by FDA under an Emergency Use Authorization (EUA). This EUA will remain in effect (meaning this test can be used) for the duration of the COVID-19 declaration under Section 564(b)(1) of the Act, 21 U.S.C. section 360bbb-3(b)(1), unless the authorization is terminated or revoked.  Performed at [facility], [address], [place], [place] [address]   Blood Culture (routine x 2)     Status: None (Preliminary result)   Collection Time: 09/02/23  4:14 PM   Specimen: BLOOD  Result Value Ref Range Status   Specimen Description BLOOD SITE NOT SPECIFIED  Final   Special Requests   Final    BOTTLES DRAWN AEROBIC AND ANAEROBIC Blood Culture results may not be optimal due to an inadequate volume of blood received in culture bottles   Culture  Setup Time   Final    GRAM POSITIVE COCCI IN PAIRS IN CHAINS IN BOTH AEROBIC AND ANAEROBIC BOTTLES CRITICAL VALUE NOTED.  VALUE IS CONSISTENT WITH PREVIOUSLY REPORTED AND CALLED VALUE. Performed at [facility], [address], [place], [place] [address]    Culture GRAM POSITIVE COCCI  Final   Report Status PENDING  Incomplete  Blood Culture (routine x 2)     Status: None (Preliminary result)   Collection Time: 09/02/23  4:36 PM   Specimen: BLOOD LEFT ARM  Result Value Ref Range Status   Specimen Description BLOOD LEFT ARM  Final   Special Requests   Final    BOTTLES DRAWN AEROBIC AND ANAEROBIC Blood Culture results may not be optimal due to an inadequate volume of blood received in culture bottles   Culture  Setup Time   Final    GRAM POSITIVE COCCI IN PAIRS IN CHAINS IN BOTH AEROBIC AND ANAEROBIC BOTTLES CRITICAL RESULT CALLED TO, READ BACK BY AND VERIFIED WITH: PHARMD J LEDFORD 09/03/2023 @ 0604 BY AB Performed at [facility], [address] [place], [place] [address]    Culture [facility] POSITIVE COCCI  Final   Report Status PENDING  Incomplete  Blood Culture ID Panel  (Reflexed)     Status: Abnormal   Collection Time: 09/02/23  4:36 PM  Result Value Ref Range Status   Enterococcus faecalis  NOT DETECTED NOT DETECTED Final   Enterococcus Faecium NOT DETECTED NOT DETECTED Final   Listeria monocytogenes NOT DETECTED NOT DETECTED Final   Staphylococcus species NOT DETECTED NOT DETECTED Final   Staphylococcus aureus (BCID) NOT DETECTED NOT DETECTED Final   Staphylococcus epidermidis NOT DETECTED NOT DETECTED Final   Staphylococcus lugdunensis NOT DETECTED NOT DETECTED Final   Streptococcus species DETECTED (A) NOT DETECTED Final    Comment: CRITICAL RESULT CALLED TO, READ BACK BY AND VERIFIED WITH: PHARMD J LEDFORD 09/03/2023 @ 0604 BY AB    Streptococcus agalactiae NOT DETECTED NOT DETECTED Final   Streptococcus pneumoniae NOT DETECTED NOT DETECTED Final   Streptococcus pyogenes DETECTED (A) NOT DETECTED Final    Comment: CRITICAL RESULT CALLED TO, READ BACK BY AND VERIFIED WITH: PHARMD J LEDFORD 09/03/2023 @ 0604 BY AB    A.calcoaceticus-baumannii NOT DETECTED NOT DETECTED Final   Bacteroides fragilis NOT DETECTED NOT DETECTED Final   Enterobacterales NOT DETECTED NOT DETECTED Final   Enterobacter cloacae complex NOT DETECTED NOT DETECTED Final   Escherichia coli NOT DETECTED NOT DETECTED Final   Klebsiella aerogenes NOT DETECTED NOT DETECTED Final   Klebsiella oxytoca NOT DETECTED NOT DETECTED Final   Klebsiella pneumoniae NOT DETECTED NOT DETECTED Final   Proteus species NOT DETECTED NOT DETECTED Final   Salmonella species NOT DETECTED NOT DETECTED Final   Serratia marcescens NOT DETECTED NOT DETECTED Final   Haemophilus influenzae NOT DETECTED NOT DETECTED Final   Neisseria meningitidis NOT DETECTED NOT DETECTED Final   Pseudomonas aeruginosa NOT DETECTED NOT DETECTED Final   Stenotrophomonas maltophilia NOT DETECTED NOT DETECTED Final   Candida albicans NOT DETECTED NOT DETECTED Final   Candida auris NOT DETECTED NOT DETECTED Final   Candida  glabrata NOT DETECTED NOT DETECTED Final   Candida krusei NOT DETECTED NOT DETECTED Final   Candida parapsilosis NOT DETECTED NOT DETECTED Final   Candida tropicalis NOT DETECTED NOT DETECTED Final   Cryptococcus neoformans/gattii NOT DETECTED NOT DETECTED Final    Comment: Performed at [facility], [address], [place], [place] [address]    I have personally spent 32 minutes involved in face-to-face and non-face-to-face activities for this patient on the day of the visit. Professional time spent includes the following activities: Preparing to see the patient (review of tests), Obtaining and/or reviewing separately obtained history (admission/discharge record), Performing a medically appropriate examination and/or evaluation , Ordering medications/tests/procedures, referring and communicating with other health care professionals, Documenting clinical information in the EMR, Independently interpreting results (not separately reported), Communicating results to the patient/family/caregiver, Counseling and educating the patient/family/caregiver and Care coordination (not separately reported).    Marcos Eke, NP [facility]  09/03/2023  3:12 PM

## 2023-09-03 NOTE — Plan of Care (Signed)
  Problem: Pain Managment: Goal: General experience of comfort will improve and/or be controlled Outcome: Progressing   Problem: Safety: Goal: Ability to remain free from injury will improve Outcome: Progressing   Problem: Skin Integrity: Goal: Risk for impaired skin integrity will decrease Outcome: Progressing

## 2023-09-03 NOTE — Assessment & Plan Note (Signed)
 Continue levothyroxine.  Check TSH

## 2023-09-03 NOTE — Assessment & Plan Note (Addendum)
 Cerebrovascular disease Hypertension Hyperlipidemia BP soft - Hold amlodipine - Continue Crestor

## 2023-09-03 NOTE — Progress Notes (Signed)
 Pt arrived to [id number] via stretcher from the ED. See assessment.

## 2023-09-03 NOTE — Progress Notes (Signed)
 PHARMACY - PHYSICIAN COMMUNICATION CRITICAL VALUE ALERT - BLOOD CULTURE IDENTIFICATION (BCID)  Timothy Spencer is an 83 y.o. male who presented to [facility] on 09/02/2023 with a chief complaint of sepsis  Name of physician (or Provider) Contacted: Dr. Antionette Char  Current antibiotics: Cefepime   Changes to prescribed antibiotics recommended:  DC Cefepime  Start IV Pen G 4 million units q6h  Results for orders placed or performed during the hospital encounter of 09/02/23  Blood Culture ID Panel (Reflexed) (Collected: 09/02/2023  4:36 PM)  Result Value Ref Range   Enterococcus faecalis NOT DETECTED NOT DETECTED   Enterococcus Faecium NOT DETECTED NOT DETECTED   Listeria monocytogenes NOT DETECTED NOT DETECTED   Staphylococcus species NOT DETECTED NOT DETECTED   Staphylococcus aureus (BCID) NOT DETECTED NOT DETECTED   Staphylococcus epidermidis NOT DETECTED NOT DETECTED   Staphylococcus lugdunensis NOT DETECTED NOT DETECTED   Streptococcus species DETECTED (A) NOT DETECTED   Streptococcus agalactiae NOT DETECTED NOT DETECTED   Streptococcus pneumoniae NOT DETECTED NOT DETECTED   Streptococcus pyogenes DETECTED (A) NOT DETECTED   A.calcoaceticus-baumannii NOT DETECTED NOT DETECTED   Bacteroides fragilis NOT DETECTED NOT DETECTED   Enterobacterales NOT DETECTED NOT DETECTED   Enterobacter cloacae complex NOT DETECTED NOT DETECTED   Escherichia coli NOT DETECTED NOT DETECTED   Klebsiella aerogenes NOT DETECTED NOT DETECTED   Klebsiella oxytoca NOT DETECTED NOT DETECTED   Klebsiella pneumoniae NOT DETECTED NOT DETECTED   Proteus species NOT DETECTED NOT DETECTED   Salmonella species NOT DETECTED NOT DETECTED   Serratia marcescens NOT DETECTED NOT DETECTED   Haemophilus influenzae NOT DETECTED NOT DETECTED   Neisseria meningitidis NOT DETECTED NOT DETECTED   Pseudomonas aeruginosa NOT DETECTED NOT DETECTED   Stenotrophomonas maltophilia NOT DETECTED NOT DETECTED   Candida albicans NOT  DETECTED NOT DETECTED   Candida auris NOT DETECTED NOT DETECTED   Candida glabrata NOT DETECTED NOT DETECTED   Candida krusei NOT DETECTED NOT DETECTED   Candida parapsilosis NOT DETECTED NOT DETECTED   Candida tropicalis NOT DETECTED NOT DETECTED   Cryptococcus neoformans/gattii NOT DETECTED NOT DETECTED    Timothy Spencer 09/03/2023  6:11 AM

## 2023-09-03 NOTE — Assessment & Plan Note (Signed)
 Cr stable relative to baseline 1.6

## 2023-09-03 NOTE — Hospital Course (Signed)
 83 y.o. F with CAD s/p PCI >5 yrs, HTN, hx TIA, COPD no O2, BPH s/p SP catheter, CKD IIIa baseline 1.2-1.6 and hypothyroidism who presented with dark urine, weakness, discomfort at catheter.  In the ER, febrile hypotensive, UA amber and cloudy.  Admitted for UTI sepsis.

## 2023-09-03 NOTE — Progress Notes (Signed)
 Pt's wife called regarding update on pt status. RN answered questions to satisfaction. Wife requesting MD update as well. Joen Laura, MD aware.

## 2023-09-03 NOTE — Progress Notes (Signed)
  Progress Note   Patient: Timothy Spencer ZOX:[id number] DOB: 12-Jan-1941 DOA: 09/02/2023     1 DOS: the patient was seen and examined on 09/03/2023 at 8:52AM      Brief hospital course: 83 y.o. F with CAD s/p PCI >5 yrs, HTN, hx TIA, COPD no O2, BPH s/p SP catheter, CKD IIIa baseline 1.2-1.6 and hypothyroidism who presented with dark urine, weakness, discomfort at catheter.  In the ER, febrile hypotensive, UA amber and cloudy.  Admitted for UTI sepsis.     Assessment and Plan: * Sepsis due to Steptococcus pyogenes due to UTI due to indwelling SP catheter (HCC) Group A strep bacteremia P/w fever, leukocytosis, hypotension, encephalopathy, elevated lactic acid, thrombocytopenia due to GAS bacteremia due to UTI due to indwelling SP cath. - Exchange cath - Continue penicillin - Repeat BCx culture tomorrow - Continue IV fluids - Consult ID    CAD (coronary artery disease) Cerebrovascular disease Hypertension Hyperlipidemia BP soft - Hold amlodipine - Continue Crestor  COPD (chronic obstructive pulmonary disease) (HCC) No wheezing - Continue ICS/LABA/LAMA  Chronic kidney disease, stage 3a (HCC) Cr stable relative to baseline 1.6  Hypothyroid - Continue levothyroxine - Check TSH  BPH with indwelling suprapubic catheter Unsure why he takes Flomax if he has SP catheter, but will continue - Exchange catheter - Continue Flomax  Acute metabolic encephalopathy At baseline, patient has no cognitive impairment, here he is noted to have confusion.Likely sepsis delirium - Delirium precautions  Thrombocytopenia (HCC) 20 point drop in platelets overnight, due to sepsis.          Subjective: FEeling tired, somewhat confused still but improving.  Still with pain at site of his SP cath.       Physical Exam: BP (!) 113/56 (BP Location: Right Arm)   Pulse 80   Temp 98.5 F (36.9 C) (Oral)   Resp 19   Ht 6\' 2"  (1.88 m)   Wt 74.7 kg   SpO2 96%   BMI 21.14 kg/m    Elderly adult male, lying in bed, interactive and appropriate but tired and somewhat disoriented RRR, no murmurs, no peripheral edema Respiratory rate normal, lungs clear without rales or wheezes Abdomen tender around the suprapubic catheter Attention normal, psychomotor slowing noted, face symmetric, generalized weakness, speech fluent    Data Reviewed: Discussed with infectious disease CT abdomen pelvis without contrast showed stranding around the bladder Creatinine down to 1.6 Lactic acid down to 2 White blood cell count down to 12 Platelets down to 95 Sodium and potassium normal    Family Communication: Wife by phone    Disposition: Status is: Inpatient 83 year old man with SP catheter came in with strep bacteremia  Source may be bladder although this is atypical  We will continue IV antibiotics, obtain echocardiogram tomorrow and repeat blood cultures, defer final agent and duration of antibiotics to infectious disease        Author: Alberteen Sam, MD 09/03/2023 7:43 PM  For on call review [email or web address].

## 2023-09-03 NOTE — Assessment & Plan Note (Addendum)
 Group A strep bacteremia P/w fever, leukocytosis, hypotension, encephalopathy, elevated lactic acid, thrombocytopenia due to GAS bacteremia.   Initially there was some concern for catheter associated UTI, but this has been ruled out.  Urine colonized with Proteus, no GAS.     - Continue penicillin - Follow repeat blood cultures from 4/8 - Follow echocardiogram - Consult ID, appreciate expertise

## 2023-09-03 NOTE — Assessment & Plan Note (Addendum)
 Unsure why he takes Flomax if he has SP catheter, but will continue - Exchange catheter - Continue Flomax

## 2023-09-03 NOTE — Progress Notes (Signed)
 Initial Nutrition Assessment  DOCUMENTATION CODES:   Severe malnutrition in context of acute illness/injury  INTERVENTION:  Ensure Enlive po TID, each supplement provides 350 kcal and 20 grams of protein.  Order bedtime snack.   Magic Cup trial at dinner.   Multivitamin daily.  Encouraged po intake.   NUTRITION DIAGNOSIS:   Severe Malnutrition related to acute illness as evidenced by moderate muscle depletion, moderate fat depletion.  GOAL:   Patient will meet greater than or equal to 90% of their needs  MONITOR:   PO intake, Supplement acceptance, Weight trends  REASON FOR ASSESSMENT:   Consult Assessment of nutrition requirement/status  ASSESSMENT:   PMH significant for suprapubic catheter, OSA, COPD, hypertension, hemochromatosis, restless leg syndrome, peripheral neuropathy, CAD, and known right bundle branch block. Admitted for sepsis for streptococcus pyogenes due to UTI.  RD met with pt at bedside. Pt reports poor appetite but that he has never really had a big appetite. Pt reports drinking 2-3 Ensure at home daily. Pt is willing to try bedtime snack. Pt report likes fruit but declined other snack options.   Medications reviewed and include: Synthroid, senna, flomax, IV antibiotics,   Labs reviewed: Albumin 3.2 L, AST 45 H   Intake/Output Summary (Last 24 hours) at 09/03/2023 1658 Last data filed at 09/03/2023 0551 Gross per 24 hour  Intake 2100 ml  Output 525 ml  Net 1575 ml    Weights reviewed. Admit weight 74.7 kg.   NUTRITION - FOCUSED PHYSICAL EXAM:  Flowsheet Row Most Recent Value  Orbital Region Moderate depletion  Upper Arm Region Moderate depletion  Thoracic and Lumbar Region Mild depletion  Buccal Region Moderate depletion  Temple Region No depletion  Clavicle Bone Region Mild depletion  Clavicle and Acromion Bone Region Moderate depletion  Scapular Bone Region Moderate depletion  Dorsal Hand Mild depletion  Patellar Region Moderate  depletion  Anterior Thigh Region Moderate depletion  Posterior Calf Region Moderate depletion  Edema (RD Assessment) None  Hair Reviewed  Eyes Reviewed  Mouth Reviewed  Skin Reviewed  Nails Reviewed       Diet Order:   Diet Order             Diet regular Room service appropriate? Yes; Fluid consistency: Thin  Diet effective now                   EDUCATION NEEDS:   Education needs have been addressed  Skin:  Skin Assessment: Reviewed RN Assessment  Last BM:  4/7  Height:   Ht Readings from Last 1 Encounters:  09/02/23 6\' 2"  (1.88 m)    Weight:   Wt Readings from Last 1 Encounters:  09/03/23 74.7 kg    Ideal Body Weight:     BMI:  Body mass index is 21.14 kg/m.  Estimated Nutritional Needs:   Kcal:  1900-2200  Protein:  85-110  Fluid:  >1.9 L  Kathrynn Speed, MPH, RD, LDN Clinical Dietitian Contact information can be found at [facility].

## 2023-09-03 NOTE — ED Notes (Signed)
Patient left the floor in stable condition with his belongings.

## 2023-09-03 NOTE — Assessment & Plan Note (Signed)
 At baseline, patient has no cognitive impairment, on admission he was noted by me to have disorientation, lkely sepsis delirium, now resolved.    - Delirium precautions

## 2023-09-03 NOTE — Assessment & Plan Note (Signed)
 No wheezing - Continue ICS/LABA/LAMA

## 2023-09-03 NOTE — Progress Notes (Signed)
 Suprapubic catheter changed out per order with a 33french cath. Patient tolerated well, no issues.  Misty Stanley LPN [employer] RN

## 2023-09-04 ENCOUNTER — Inpatient Hospital Stay (HOSPITAL_COMMUNITY)

## 2023-09-04 DIAGNOSIS — G9341 Metabolic encephalopathy: Secondary | ICD-10-CM | POA: Diagnosis not present

## 2023-09-04 DIAGNOSIS — R7881 Bacteremia: Secondary | ICD-10-CM

## 2023-09-04 DIAGNOSIS — A400 Sepsis due to streptococcus, group A: Secondary | ICD-10-CM | POA: Diagnosis not present

## 2023-09-04 DIAGNOSIS — R6521 Severe sepsis with septic shock: Secondary | ICD-10-CM | POA: Diagnosis not present

## 2023-09-04 LAB — ECHOCARDIOGRAM COMPLETE BUBBLE STUDY
AR max vel: 3.46 cm2
AV Area VTI: 3.45 cm2
AV Area mean vel: 3.1 cm2
AV Mean grad: 2 mmHg
AV Peak grad: 4.8 mmHg
Ao pk vel: 1.09 m/s
Area-P 1/2: 3.12 cm2
Calc EF: 61.1 %
S' Lateral: 2.3 cm
Single Plane A2C EF: 56.9 %
Single Plane A4C EF: 66.1 %

## 2023-09-04 LAB — BASIC METABOLIC PANEL WITH GFR
Anion gap: 12 (ref 5–15)
BUN: 25 mg/dL — ABNORMAL HIGH (ref 8–23)
CO2: 22 mmol/L (ref 22–32)
Calcium: 8.7 mg/dL — ABNORMAL LOW (ref 8.9–10.3)
Chloride: 102 mmol/L (ref 98–111)
Creatinine, Ser: 1.38 mg/dL — ABNORMAL HIGH (ref 0.61–1.24)
GFR, Estimated: 51 mL/min — ABNORMAL LOW (ref >60)
Glucose, Bld: 120 mg/dL — ABNORMAL HIGH (ref 70–99)
Potassium: 3.6 mmol/L (ref 3.5–5.1)
Sodium: 136 mmol/L (ref 135–145)

## 2023-09-04 LAB — CBC
HCT: 33.7 % — ABNORMAL LOW (ref 39.0–52.0)
Hemoglobin: 11.7 g/dL — ABNORMAL LOW (ref 13.0–17.0)
MCH: 32.1 pg (ref 26.0–34.0)
MCHC: 34.7 g/dL (ref 30.0–36.0)
MCV: 92.3 fL (ref 80.0–100.0)
Platelets: 85 10*3/uL — ABNORMAL LOW (ref 150–400)
RBC: 3.65 MIL/uL — ABNORMAL LOW (ref 4.22–5.81)
RDW: 14.4 % (ref 11.5–15.5)
WBC: 8.7 10*3/uL (ref 4.0–10.5)
nRBC: 0 % (ref 0.0–0.2)

## 2023-09-04 MED ORDER — MOMETASONE FURO-FORMOTEROL FUM 200-5 MCG/ACT IN AERO
2.0000 | INHALATION_SPRAY | Freq: Two times a day (BID) | RESPIRATORY_TRACT | Status: DC
  Administered 2023-09-05 – 2023-09-06 (×3): 2 via RESPIRATORY_TRACT

## 2023-09-04 MED ORDER — PERFLUTREN LIPID MICROSPHERE
1.0000 mL | INTRAVENOUS | Status: AC | PRN
  Administered 2023-09-04: 2 mL via INTRAVENOUS

## 2023-09-04 MED ORDER — UMECLIDINIUM BROMIDE 62.5 MCG/ACT IN AEPB
1.0000 | INHALATION_SPRAY | Freq: Every day | RESPIRATORY_TRACT | Status: DC
  Administered 2023-09-06: 1 via RESPIRATORY_TRACT
  Filled 2023-09-04: qty 7

## 2023-09-04 NOTE — Evaluation (Signed)
 Physical Therapy Evaluation Patient Details Name: Timothy Spencer MRN: [id number] DOB: 30-Aug-1940 Today's Date: 09/04/2023  History of Present Illness  The pt is an 83 yo male presenting 4/6 with darker urine, admitted for sepsis and UTI. PMH includes: chronic suprapubic catheter, OSA, COPD not on O2, HTN, hemochromatosis, restless leg syndrome, peripheral neuropathy, and CAD.   Clinical Impression  Pt in bed upon arrival of PT, agreeable to evaluation at this time. Prior to admission the pt was ambulating with use of RW in the home and rollator in community, reports no recent falls since switch to use of RW, and independence with bathing, toileting, and showering. The pt does describe sedentary lifestyle with his day spent 50/50 in bed or chair. The pt was able to complete bed mobility without assistance, but needed min-modA to manage sit-stand transfer with use of RW depending on height of surface. Pt also limited to ~6 ft ambulation at this time, and pt states he needs to be able to ambulate ~20 ft in his home to get to bathroom. The pt will continue to benefit from skilled PT acutely to progress activity tolerance, stability, and strength to be able to return home with family assist once medically stable.          If plan is discharge home, recommend the following: A little help with walking and/or transfers;A little help with bathing/dressing/bathroom;Assist for transportation;Help with stairs or ramp for entrance;Supervision due to cognitive status   Can travel by private vehicle        Equipment Recommendations Wheelchair (measurements PT);Wheelchair cushion (measurements PT) (vs no DME if pt walking distance improves)  Recommendations for Other Services  OT consult    Functional Status Assessment Patient has had a recent decline in their functional status and demonstrates the ability to make significant improvements in function in a reasonable and predictable amount of time.      Precautions / Restrictions Precautions Precautions: Fall Recall of Precautions/Restrictions: Intact Restrictions Weight Bearing Restrictions Per Provider Order: No      Mobility  Bed Mobility Overal bed mobility: Needs Assistance Bed Mobility: Supine to Sit     Supine to sit: Contact guard, Used rails     General bed mobility comments: increased time    Transfers Overall transfer level: Needs assistance Equipment used: Rolling walker (2 wheels) Transfers: Sit to/from Stand, Bed to chair/wheelchair/BSC Sit to Stand: Min assist, From elevated surface, Mod assist   Step pivot transfers: Min assist       General transfer comment: minA to rise from elevated bed, modA to rise from recliner. limited power in LE, trunk flexed in standing    Ambulation/Gait Ambulation/Gait assistance: Min assist Gait Distance (Feet): 6 Feet Assistive device: Rolling walker (2 wheels) Gait Pattern/deviations: Step-to pattern, Decreased stride length, Shuffle, Trunk flexed Gait velocity: decreased Gait velocity interpretation: <1.8 ft/sec, indicate of risk for recurrent falls   General Gait Details: pt with small steps, minimal clearance, trunk flexed and heavy dependence on BUE support. declined further ambulation due to fagitue     Balance Overall balance assessment: Needs assistance Sitting-balance support: No upper extremity supported Sitting balance-Leahy Scale: Fair Sitting balance - Comments: good static sitting, posterior LOB with LE MMT Postural control: Posterior lean Standing balance support: Bilateral upper extremity supported, During functional activity, Reliant on assistive device for balance Standing balance-Leahy Scale: Poor Standing balance comment: dependent on BUE support  Pertinent Vitals/Pain Pain Assessment Pain Assessment: No/denies pain    Home Living Family/patient expects to be discharged to:: Private residence Living  Arrangements: Spouse/significant other Available Help at Discharge: Family;Available 24 hours/day Type of Home: House Home Access: Stairs to enter Entrance Stairs-Rails: None Entrance Stairs-Number of Steps: 1 Alternate Level Stairs-Number of Steps: flight but has a chair lift Home Layout: Bed/bath upstairs;Full bath on main level;Multi-level Home Equipment: Rolling Walker (2 wheels);Rollator (4 wheels);Shower seat;Grab bars - tub/shower;Hand held shower head;BSC/3in1;Grab bars - toilet      Prior Function Prior Level of Function : Needs assist;History of Falls (last six months)             Mobility Comments: uses  walker in the home and rollator in community, no  falls in last 6 months, but fell many times in last year. no falls since using walker. ADLs Comments: pt reports bathing and showers independently, does not cook, has maid. sits for showers     Extremity/Trunk Assessment   Upper Extremity Assessment Upper Extremity Assessment: Overall WFL for tasks assessed    Lower Extremity Assessment Lower Extremity Assessment: Overall WFL for tasks assessed (hx of neuropathy, pt denies difference in sensation. grossly 4-/5 to MMT)    Cervical / Trunk Assessment Cervical / Trunk Assessment: Kyphotic  Communication   Communication Communication: No apparent difficulties    Cognition Arousal: Alert Behavior During Therapy: WFL for tasks assessed/performed   PT - Cognitive impairments: No family/caregiver present to determine baseline                       PT - Cognition Comments: pt following all commands, answering questions appropriately, not formally assessed Following commands: Intact       Cueing Cueing Techniques: Verbal cues     General Comments General comments (skin integrity, edema, etc.): VSS on RA SpO2 98%, 3/4 DOE        Assessment/Plan    PT Assessment Patient needs continued PT services  PT Problem List Decreased strength;Decreased  activity tolerance;Decreased balance;Decreased mobility       PT Treatment Interventions DME instruction;Gait training;Stair training;Functional mobility training;Therapeutic activities;Therapeutic exercise;Balance training;Patient/family education    PT Goals (Current goals can be found in the Care Plan section)  Acute Rehab PT Goals Patient Stated Goal: to return home when moving better PT Goal Formulation: With patient Time For Goal Achievement: 09/18/23 Potential to Achieve Goals: Good    Frequency Min 2X/week        AM-PAC PT "6 Clicks" Mobility  Outcome Measure Help needed turning from your back to your side while in a flat bed without using bedrails?: A Little Help needed moving from lying on your back to sitting on the side of a flat bed without using bedrails?: A Little Help needed moving to and from a bed to a chair (including a wheelchair)?: A Little Help needed standing up from a chair using your arms (e.g., wheelchair or bedside chair)?: A Lot Help needed to walk in hospital room?: Total (<20 ft) Help needed climbing 3-5 steps with a railing? : Total 6 Click Score: 13    End of Session Equipment Utilized During Treatment: Gait belt Activity Tolerance: Patient tolerated treatment well;Patient limited by fatigue Patient left: in chair;with call bell/phone within reach;with chair alarm set Nurse Communication: Mobility status PT Visit Diagnosis: Unsteadiness on feet (R26.81);Other abnormalities of gait and mobility (R26.89);Muscle weakness (generalized) (M62.81)    Time: 1610-9604 PT Time Calculation (min) (ACUTE ONLY): 25  min   Charges:   PT Evaluation $PT Eval Moderate Complexity: 1 Mod PT Treatments $Therapeutic Activity: 8-22 mins PT General Charges $$ ACUTE PT VISIT: 1 Visit         Vickki Muff, PT, DPT   Acute Rehabilitation Department Office [phone] Secure Chat Communication Preferred  Ronnie Derby 09/04/2023, 3:43 PM

## 2023-09-04 NOTE — Plan of Care (Signed)
  Problem: Clinical Measurements: Goal: Will remain free from infection Outcome: Progressing Goal: Cardiovascular complication will be avoided Outcome: Progressing   Problem: Activity: Goal: Risk for activity intolerance will decrease Outcome: Progressing   

## 2023-09-04 NOTE — Progress Notes (Signed)
  Progress Note   Patient: Timothy Spencer JYN:[id number] DOB: 02-Mar-1941 DOA: 09/02/2023     2 DOS: the patient was seen and examined on 09/04/2023 at 11:25AM      Brief hospital course: 83 y.o. F with CAD s/p PCI >5 yrs, HTN, hx TIA, COPD no O2, BPH s/p SP catheter, CKD IIIa baseline 1.2-1.6 and hypothyroidism who presented with dark urine, weakness, discomfort at catheter.  In the ER, febrile hypotensive, UA amber and cloudy.  Admitted for UTI sepsis.     Assessment and Plan: * Sepsis due to Steptococcus pyogenes (HCC) Group A strep bacteremia P/w fever, leukocytosis, hypotension, encephalopathy, elevated lactic acid, thrombocytopenia due to GAS bacteremia.   Initially there was some concern for catheter associated UTI, but this has been ruled out.  Urine colonized with Proteus, no GAS.     - Continue penicillin - Follow repeat blood cultures from 4/8 - Follow echocardiogram - Consult ID, appreciate expertise    CAD (coronary artery disease) Cerebrovascular disease Hypertension Hyperlipidemia BP low normal off meds - Hold amlodipine - Continue Crestor  COPD (chronic obstructive pulmonary disease) (HCC) No wheezing - Continue ICS/LABA/LAMA  Chronic kidney disease, stage 3a (HCC) Cr stable relative to baseline 1.2-1.6  Hypothyroid - Continue levothyroxine - Check TSH  BPH with indwelling suprapubic catheter Unsure why he takes Flomax if he has SP catheter, but will continue - Exchange catheter - Continue Flomax  Acute metabolic encephalopathy At baseline, patient has no cognitive impairment, on admission he was noted by me to have disorientation, lkely sepsis delirium, now resolved.    - Delirium precautions  Thrombocytopenia (HCC) Platelets trending down - SCDs for DVT PPx  Normocytic anemia Mild, asymptomatic, due to sepsis - Repeat CBC as an outpatient        Subjective: Patient is feeling better, appetite is good, no further fever.  Mentation is  improved.  Nursing concerns.     Physical Exam: BP (!) 113/54 (BP Location: Left Arm)   Pulse 82   Temp 98 F (36.7 C)   Resp 18   Ht 6\' 2"  (1.88 m)   Wt 74.7 kg   SpO2 96%   BMI 21.14 kg/m   Elderly adult male, sitting up in bed, reading RRR, no murmurs, no peripheral edema Respiratory rate normal, lungs clear without rales or wheezes Abdomen soft, no tenderness palpation or guarding Attention normal, affect present, judgment and insight appear normal, face symmetric, speech fluent    Data Reviewed: Basic metabolic panel shows creatinine down to 1.4, sodium and potassium normal CBC shows worsening thrombocytopenia, leukocytosis resolved, mild anemia    Family Communication: Wife by phone    Disposition: Status is: Inpatient  83 y.o. M with strep bacteremia  Source unclear.    Will continue IV antibiotics, obtain echo; if cultures negative at 48 hours, likely able to dc home with [facility] with final antibiotic regimen per ID        Author: Alberteen Sam, MD 09/04/2023 1:20 PM  For on call review [email or web address].

## 2023-09-04 NOTE — Progress Notes (Signed)
  Echocardiogram 2D Echocardiogram has been performed.  Rosemary Holms 09/04/2023, 2:38 PM

## 2023-09-04 NOTE — Progress Notes (Signed)
 [facility]  Date of Admission:  09/02/2023     Reason for Follow Up: Sepsis [facility])  Total days of antibiotics 3         ASSESSMENT:  Timothy Spencer blood cultures from 09/04/2023 are pending in the setting of Streptococcus pyogenes bacteremia with most likely source being the suprapubic catheter.  Unlikely urinary source with urine culture growing Proteus mirabilis.  Continue to monitor blood cultures for clearance of bacteremia.  Echocardiogram pending.  Recommend exchange of suprapubic catheter given setting of bacteremia.  Discussed plan of care to continue current dose of penicillin.  Anticipate treating with oral amoxicillin pending clearance of blood cultures.  Continue universal/standard precautions.  Remaining medical and supportive care per primary team.   PLAN:  Continue current dose of penicillin G. Monitor cultures for clearance of bacteremia. Exchange suprapubic catheter if not already completed. Await TTE. Continue universal/standard precautions. Remaining medical and supportive care per internal medicine  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 [facility]) Active Problems:   Hypothyroid   Hyperlipidemia with target LDL less than 70   CAD (coronary artery disease)   Essential hypertension   COPD (chronic obstructive pulmonary disease) (HCC)   Thrombocytopenia (HCC)   Chronic kidney disease, stage 3a (HCC)   Acute metabolic encephalopathy   BPH with indwelling suprapubic catheter   Streptococcal bacteremia   Suprapubic catheter (HCC)    buPROPion  150 mg Oral q morning   feeding supplement  237 mL Oral TID BM   latanoprost  1 drop Both Eyes QHS   levothyroxine  112 mcg Oral Daily   mometasone-formoterol  2 puff Inhalation BID   And   umeclidinium bromide  1 puff Inhalation Daily   multivitamin with minerals  1 tablet Oral Daily   rOPINIRole  1 mg Oral QHS   rosuvastatin  5 mg Oral Daily   senna-docusate  1 tablet Oral  QHS   sertraline  200 mg Oral Daily   tamsulosin  0.4 mg Oral Daily    SUBJECTIVE:  Afebrile overnight with no acute events.  Tolerating antibiotics with no adverse side effects.  Denies fevers, chills, or sweats.  No new areas of concern/pain  Allergies  Allergen Reactions   Hydrochlorothiazide W-Spironolactone Other (See Comments)    Other Reaction(s): dizziness, fainting     Review of Systems: Review of Systems  Constitutional:  Negative for chills, fever and weight loss.  Respiratory:  Negative for cough, shortness of breath and wheezing.   Cardiovascular:  Negative for chest pain and leg swelling.  Gastrointestinal:  Negative for abdominal pain, constipation, diarrhea, nausea and vomiting.  Skin:  Negative for rash.      OBJECTIVE: Vitals:   09/03/23 2107 09/04/23 0502 09/04/23 0744 09/04/23 0836  BP: (!) 121/47 (!) 132/57 (!) 113/54   Pulse: 87 73 84 82  Resp: 17 18 18 18   Temp: 98 F (36.7 C) 98.2 F (36.8 C) 98 F (36.7 C)   TempSrc: Oral Oral    SpO2: 96% 97% 97% 96%  Weight:      Height:       Body mass index is 21.14 kg/m.  Physical Exam Constitutional:      General: He is not in acute distress.    Appearance: He is well-developed.  Cardiovascular:     Rate and Rhythm: Normal rate and regular rhythm.     Heart sounds: Normal heart sounds.  Pulmonary:     Effort: Pulmonary effort  is normal.     Breath sounds: Normal breath sounds.  Genitourinary:    Comments: Suprapubic catheter in place.  Skin:    General: Skin is warm and dry.  Neurological:     Mental Status: He is alert and oriented to person, place, and time.  Psychiatric:        Mood and Affect: Mood normal.     Lab Results Lab Results  Component Value Date   WBC 8.7 09/04/2023   HGB 11.7 (L) 09/04/2023   HCT 33.7 (L) 09/04/2023   MCV 92.3 09/04/2023   PLT 85 (L) 09/04/2023    Lab Results  Component Value Date   CREATININE 1.38 (H) 09/04/2023   BUN 25 (H) 09/04/2023   NA  136 09/04/2023   K 3.6 09/04/2023   CL 102 09/04/2023   CO2 22 09/04/2023    Lab Results  Component Value Date   ALT 28 09/02/2023   AST 45 (H) 09/02/2023   ALKPHOS 70 09/02/2023   BILITOT 1.5 (H) 09/02/2023     Microbiology: Recent Results (from the past 240 hours)  Resp panel by RT-PCR (RSV, Flu A&B, Covid) Anterior Nasal Swab     Status: None   Collection Time: 09/02/23  4:14 PM   Specimen: Anterior Nasal Swab  Result Value Ref Range Status   SARS Coronavirus 2 by RT PCR NEGATIVE NEGATIVE Final   Influenza A by PCR NEGATIVE NEGATIVE Final   Influenza B by PCR NEGATIVE NEGATIVE Final    Comment: (NOTE) The Xpert Xpress SARS-CoV-2/FLU/RSV plus assay is intended as an aid in the diagnosis of influenza from Nasopharyngeal swab specimens and should not be used as a sole basis for treatment. Nasal washings and aspirates are unacceptable for Xpert Xpress SARS-CoV-2/FLU/RSV testing.  Fact Sheet for Patients: BloggerCourse.com  Fact Sheet for Healthcare Providers: SeriousBroker.it  This test is not yet approved or cleared by the [place] FDA and has been authorized for detection and/or diagnosis of SARS-CoV-2 by FDA under an Emergency Use Authorization (EUA). This EUA will remain in effect (meaning this test can be used) for the duration of the COVID-19 declaration under Section 564(b)(1) of the Act, 21 U.S.C. section 360bbb-3(b)(1), unless the authorization is terminated or revoked.     Resp Syncytial Virus by PCR NEGATIVE NEGATIVE Final    Comment: (NOTE) Fact Sheet for Patients: BloggerCourse.com  Fact Sheet for Healthcare Providers: SeriousBroker.it  This test is not yet approved or cleared by the [place] FDA and has been authorized for detection and/or diagnosis of SARS-CoV-2 by FDA under an Emergency Use Authorization (EUA). This EUA will remain in effect  (meaning this test can be used) for the duration of the COVID-19 declaration under Section 564(b)(1) of the Act, 21 U.S.C. section 360bbb-3(b)(1), unless the authorization is terminated or revoked.  Performed at [facility], [address], [place], [place] [address]   Blood Culture (routine x 2)     Status: Abnormal (Preliminary result)   Collection Time: 09/02/23  4:14 PM   Specimen: BLOOD  Result Value Ref Range Status   Specimen Description BLOOD SITE NOT SPECIFIED  Final   Special Requests   Final    BOTTLES DRAWN AEROBIC AND ANAEROBIC Blood Culture results may not be optimal due to an inadequate volume of blood received in culture bottles   Culture  Setup Time   Final    GRAM POSITIVE COCCI IN PAIRS IN CHAINS IN BOTH AEROBIC AND ANAEROBIC BOTTLES CRITICAL VALUE NOTED.  VALUE IS CONSISTENT WITH PREVIOUSLY REPORTED AND CALLED VALUE.    Culture (A)  Final    GROUP A STREP (S.PYOGENES) ISOLATED HEALTH DEPARTMENT NOTIFIED Performed at [facility], [address], [place], [place] [address]    Report Status PENDING  Incomplete  Urine Culture     Status: Abnormal (Preliminary result)   Collection Time: 09/02/23  4:14 PM   Specimen: Urine, Random  Result Value Ref Range Status   Specimen Description URINE, RANDOM  Final   Special Requests NONE Reflexed from [id number]  Final   Culture (A)  Final    >=100,000 COLONIES/mL PROTEUS MIRABILIS SUSCEPTIBILITIES TO FOLLOW CULTURE REINCUBATED FOR BETTER GROWTH Performed at [facility], [address] [place], [place] [address]    Report Status PENDING  Incomplete  Blood Culture (routine x 2)     Status: Abnormal (Preliminary result)   Collection Time: 09/02/23  4:36 PM   Specimen: BLOOD LEFT ARM  Result Value Ref Range Status   Specimen Description BLOOD LEFT ARM  Final   Special Requests   Final    BOTTLES DRAWN AEROBIC AND ANAEROBIC Blood Culture results may not be optimal due to an inadequate volume of blood  received in culture bottles   Culture  Setup Time   Final    GRAM POSITIVE COCCI IN PAIRS IN CHAINS IN BOTH AEROBIC AND ANAEROBIC BOTTLES CRITICAL RESULT CALLED TO, READ BACK BY AND VERIFIED WITH: PHARMD J LEDFORD 09/03/2023 @ 0604 BY AB    Culture (A)  Final    GROUP A STREP (S.PYOGENES) ISOLATED SUSCEPTIBILITIES TO FOLLOW HEALTH DEPARTMENT NOTIFIED Performed at [facility], [address] [place], [place] [address]    Report Status PENDING  Incomplete  Blood Culture ID Panel (Reflexed)     Status: Abnormal   Collection Time: 09/02/23  4:36 PM  Result Value Ref Range Status   Enterococcus faecalis NOT DETECTED NOT DETECTED Final   Enterococcus Faecium NOT DETECTED NOT DETECTED Final   Listeria monocytogenes NOT DETECTED NOT DETECTED Final   Staphylococcus species NOT DETECTED NOT DETECTED Final   Staphylococcus aureus (BCID) NOT DETECTED NOT DETECTED Final   Staphylococcus epidermidis NOT DETECTED NOT DETECTED Final   Staphylococcus lugdunensis NOT DETECTED NOT DETECTED Final   Streptococcus species DETECTED (A) NOT DETECTED Final    Comment: CRITICAL RESULT CALLED TO, READ BACK BY AND VERIFIED WITH: PHARMD J LEDFORD 09/03/2023 @ 0604 BY AB    Streptococcus agalactiae NOT DETECTED NOT DETECTED Final   Streptococcus pneumoniae NOT DETECTED NOT DETECTED Final   Streptococcus pyogenes DETECTED (A) NOT DETECTED Final    Comment: CRITICAL RESULT CALLED TO, READ BACK BY AND VERIFIED WITH: PHARMD J LEDFORD 09/03/2023 @ 0604 BY AB    A.calcoaceticus-baumannii NOT DETECTED NOT DETECTED Final   Bacteroides fragilis NOT DETECTED NOT DETECTED Final   Enterobacterales NOT DETECTED NOT DETECTED Final   Enterobacter cloacae complex NOT DETECTED NOT DETECTED Final   Escherichia coli NOT DETECTED NOT DETECTED Final   Klebsiella aerogenes NOT DETECTED NOT DETECTED Final   Klebsiella oxytoca NOT DETECTED NOT DETECTED Final   Klebsiella pneumoniae NOT DETECTED NOT DETECTED Final   Proteus  species NOT DETECTED NOT DETECTED Final   Salmonella species NOT DETECTED NOT DETECTED Final   Serratia marcescens NOT DETECTED NOT DETECTED Final   Haemophilus influenzae NOT DETECTED NOT DETECTED Final   Neisseria meningitidis NOT DETECTED NOT DETECTED Final   Pseudomonas aeruginosa NOT DETECTED NOT DETECTED Final  Stenotrophomonas maltophilia NOT DETECTED NOT DETECTED Final   Candida albicans NOT DETECTED NOT DETECTED Final   Candida auris NOT DETECTED NOT DETECTED Final   Candida glabrata NOT DETECTED NOT DETECTED Final   Candida krusei NOT DETECTED NOT DETECTED Final   Candida parapsilosis NOT DETECTED NOT DETECTED Final   Candida tropicalis NOT DETECTED NOT DETECTED Final   Cryptococcus neoformans/gattii NOT DETECTED NOT DETECTED Final    Comment: Performed at [facility], [address], [place], [place] [address]  C Difficile Quick Screen w PCR reflex     Status: None   Collection Time: 09/03/23 11:42 AM   Specimen: STOOL  Result Value Ref Range Status   C Diff antigen NEGATIVE NEGATIVE Final   C Diff toxin NEGATIVE NEGATIVE Final   C Diff interpretation No C. difficile detected.  Final    Comment: Performed at [facility], [address] [place], [place] [address]    I have personally spent 26 minutes involved in face-to-face and non-face-to-face activities for this patient on the day of the visit. Professional time spent includes the following activities: Preparing to see the patient (review of tests), Obtaining and/or reviewing separately obtained history (admission/discharge record), Performing a medically appropriate examination and/or evaluation , Ordering medications/tests/procedures, referring and communicating with other health care professionals, Documenting clinical information in the EMR, Independently interpreting results (not separately reported), Communicating results to the patient/family/caregiver, Counseling and educating the patient/family/caregiver  and Care coordination (not separately reported).   Marcos Eke, NP [facility]  09/04/2023  1:22 PM

## 2023-09-05 DIAGNOSIS — A400 Sepsis due to streptococcus, group A: Secondary | ICD-10-CM | POA: Diagnosis not present

## 2023-09-05 DIAGNOSIS — J449 Chronic obstructive pulmonary disease, unspecified: Secondary | ICD-10-CM | POA: Diagnosis not present

## 2023-09-05 DIAGNOSIS — N1831 Chronic kidney disease, stage 3a: Secondary | ICD-10-CM | POA: Diagnosis not present

## 2023-09-05 DIAGNOSIS — B950 Streptococcus, group A, as the cause of diseases classified elsewhere: Secondary | ICD-10-CM

## 2023-09-05 DIAGNOSIS — I10 Essential (primary) hypertension: Secondary | ICD-10-CM | POA: Diagnosis not present

## 2023-09-05 LAB — CBC
HCT: 30.2 % — ABNORMAL LOW (ref 39.0–52.0)
Hemoglobin: 10.2 g/dL — ABNORMAL LOW (ref 13.0–17.0)
MCH: 31.4 pg (ref 26.0–34.0)
MCHC: 33.8 g/dL (ref 30.0–36.0)
MCV: 92.9 fL (ref 80.0–100.0)
Platelets: 80 10*3/uL — ABNORMAL LOW (ref 150–400)
RBC: 3.25 MIL/uL — ABNORMAL LOW (ref 4.22–5.81)
RDW: 14.3 % (ref 11.5–15.5)
WBC: 5.7 10*3/uL (ref 4.0–10.5)
nRBC: 0 % (ref 0.0–0.2)

## 2023-09-05 LAB — BASIC METABOLIC PANEL WITH GFR
Anion gap: 7 (ref 5–15)
BUN: 27 mg/dL — ABNORMAL HIGH (ref 8–23)
CO2: 24 mmol/L (ref 22–32)
Calcium: 8.4 mg/dL — ABNORMAL LOW (ref 8.9–10.3)
Chloride: 103 mmol/L (ref 98–111)
Creatinine, Ser: 1.26 mg/dL — ABNORMAL HIGH (ref 0.61–1.24)
GFR, Estimated: 57 mL/min — ABNORMAL LOW (ref >60)
Glucose, Bld: 96 mg/dL (ref 70–99)
Potassium: 4.1 mmol/L (ref 3.5–5.1)
Sodium: 134 mmol/L — ABNORMAL LOW (ref 135–145)

## 2023-09-05 LAB — CULTURE, BLOOD (ROUTINE X 2)

## 2023-09-05 LAB — TSH: TSH: 20.797 u[IU]/mL — ABNORMAL HIGH (ref 0.350–4.500)

## 2023-09-05 MED ORDER — SERTRALINE HCL 100 MG PO TABS
100.0000 mg | ORAL_TABLET | Freq: Every day | ORAL | Status: DC
  Administered 2023-09-05 – 2023-09-06 (×2): 100 mg via ORAL
  Filled 2023-09-05: qty 1

## 2023-09-05 NOTE — Progress Notes (Addendum)
 [facility] PROGRESS NOTE    Progress Note  Timothy Spencer  LOV:[id number] DOB: March 30, 1941 DOA: 09/02/2023 PCP: Merri Brunette, MD     Brief Narrative:   Timothy Spencer is an 83 y.o. male past medical history of CAD status post PCI.  5 years ago, essential hypertension, history of TIA, COPD not on oxygen, BPH status post suprapubic catheter, chronic kidney disease stage IIIa with a baseline creatinine around 1.2-1.6 comes in for dark urine and discomfort in his suprapubic catheter was found to be febrile in the ED admitted for UTI and sepsis. Assessment/Plan:   Severe sepsis due to Steptococcus pyogenes (HCC)/group a streptococcus bacteremia: Initially there was a concern for catheter UTI but this has been ruled out. Blood cultures on 09/02/2023 grew GBA pansensitive. ID has been consulted recommended a 2D echo with an EF of 60% no motion abnormality grade 1 diastolic dysfunction no valvular vegetation. Surveillance blood cultures on 09/03/2023 have been negative till date. ID recommended high-dose penicillin IV which can be switched to oral high-dose amoxicillin upon discharge.  For total of 2 weeks of antibiotics from date of culture clearance. PT evaluated the recommended home health PT  CAD/CVA/hypertension/hyperlipidemia: Holding amlodipine continue Crestor. Pressures well-controlled.  COPD: Continue inhalers.  Chronic kidney disease stage IIIa: His creatinine appears to be at baseline.  Hypothyroidism: Continue Synthroid. TSH was 20, repeat in 6 weeks as an outpatient.  BPH in the setting of indwelling suprapubic catheter: Unsure if he takes Flomax.This was continued. Foley catheter has been exchanged.  Acute metabolic encephalopathy: Likely due to infectious etiology now resolved.  Thrombocytopenia: Continue to hold Lovenox, platelets appear to be stabilizing this morning and really repeat a CBC tomorrow morning.  Normocytic anemia: Hemoglobin is  stable.  Moderate protein malnutrition: Noted  DVT prophylaxis: scd Family Communication:wife Status is: Inpatient Remains inpatient appropriate because: Sepsis due to bacteremia    Code Status:     Code Status Orders  (From admission, onward)           Start     Ordered   09/02/23 2229  Full code  Continuous       Question:  By:  Answer:  Consent: discussion documented in EHR   09/02/23 2229           Code Status History     Date Active Date Inactive Code Status Order ID Comments User Context   04/03/2023 1259 04/04/2023 0508 Full Code [id number]  Gilmer Mor, DO HOV   02/20/2023 1205 02/21/2023 0513 Full Code [id number]  Richarda Overlie, MD HOV   08/03/2022 1653 08/09/2022 2038 Full Code [id number]  Jerald Kief, MD Inpatient   05/09/2022 2137 05/14/2022 0001 Full Code [id number]  Charlsie Quest, MD ED   01/29/2022 2316 01/30/2022 0701 Full Code [id number]  Charlsie Quest, MD ED   11/24/2014 1418 11/24/2014 2109 Full Code [id number]  Lennette Bihari, MD Inpatient         IV Access:   Peripheral IV   Procedures and diagnostic studies:   ECHOCARDIOGRAM COMPLETE BUBBLE STUDY Result Date: 09/04/2023    ECHOCARDIOGRAM REPORT   Patient Name:   Timothy Spencer Date of Exam: 09/04/2023 Medical Rec #:  [id number]       Height:       74.0 in Accession #:    [id number]      Weight:       164.7 lb Date of Birth:  Sep 27, 1940      BSA:  2.001 m Patient Age:    82 years        BP:           113/54 mmHg Patient Gender: M               HR:           83 bpm. Exam Location:  Inpatient Procedure: 2D Echo, Color Doppler and Cardiac Doppler (Both Spectral and Color            Flow Doppler were utilized during procedure). Indications:    Bacteremia  History:        Patient has prior history of Echocardiogram examinations, most                 recent 10/28/2021.  Sonographer:    Maxwell Marion Referring Phys: [id number] GREGORY D CALONE IMPRESSIONS  1. Left ventricular ejection fraction, by estimation,  is 60 to 65%. The left ventricle has normal function. The left ventricle has no regional wall motion abnormalities. There is moderate asymmetric left ventricular hypertrophy. Left ventricular diastolic parameters are consistent with Grade I diastolic dysfunction (impaired relaxation).  2. Right ventricular systolic function is normal. The right ventricular size is normal.  3. Poor quality bubble study.  4. There is no evidence of cardiac tamponade.  5. The mitral valve is normal in structure. No evidence of mitral valve regurgitation. No evidence of mitral stenosis.  6. The aortic valve was not well visualized. Aortic valve regurgitation is not visualized. No aortic stenosis is present. Comparison(s): No significant change from prior study. Prior images reviewed side by side. Present study is more technically difficult. Conclusion(s)/Recommendation(s): No evidence of valvular vegetations on this transthoracic echocardiogram. Consider a transesophageal echocardiogram to exclude infective endocarditis if clinically indicated. FINDINGS  Left Ventricle: Left ventricular ejection fraction, by estimation, is 60 to 65%. The left ventricle has normal function. The left ventricle has no regional wall motion abnormalities. Definity contrast agent was given IV to delineate the left ventricular  endocardial borders. Strain was performed and the global longitudinal strain is indeterminate. The left ventricular internal cavity size was normal in size. There is moderate asymmetric left ventricular hypertrophy. Left ventricular diastolic parameters  are consistent with Grade I diastolic dysfunction (impaired relaxation). Right Ventricle: The right ventricular size is normal. No increase in right ventricular wall thickness. Right ventricular systolic function is normal. Left Atrium: Left atrial size was normal in size. Right Atrium: Right atrial size was normal in size. Pericardium: Trivial pericardial effusion is present. There is  no evidence of cardiac tamponade. Mitral Valve: The mitral valve is normal in structure. No evidence of mitral valve regurgitation. No evidence of mitral valve stenosis. Tricuspid Valve: The tricuspid valve is normal in structure. Tricuspid valve regurgitation is not demonstrated. No evidence of tricuspid stenosis. Aortic Valve: The aortic valve was not well visualized. Aortic valve regurgitation is not visualized. No aortic stenosis is present. Aortic valve mean gradient measures 2.0 mmHg. Aortic valve peak gradient measures 4.8 mmHg. Aortic valve area, by VTI measures 3.45 cm. Pulmonic Valve: The pulmonic valve was normal in structure. Pulmonic valve regurgitation is not visualized. No evidence of pulmonic stenosis. Aorta: The aortic root and ascending aorta are structurally normal, with no evidence of dilitation. IAS/Shunts: The atrial septum is grossly normal. Additional Comments: 3D was performed not requiring image post processing on an independent workstation and was indeterminate.  LEFT VENTRICLE PLAX 2D LVIDd:         3.90 cm  Diastology LVIDs:         2.30 cm      LV e' medial:    5.77 cm/s LV PW:         0.70 cm      LV E/e' medial:  12.6 LV IVS:        1.40 cm      LV e' lateral:   6.20 cm/s LVOT diam:     2.30 cm      LV E/e' lateral: 11.7 LV SV:         71 LV SV Index:   36 LVOT Area:     4.15 cm  LV Volumes (MOD) LV vol d, MOD A2C: 96.3 ml LV vol d, MOD A4C: 110.0 ml LV vol s, MOD A2C: 41.5 ml LV vol s, MOD A4C: 37.3 ml LV SV MOD A2C:     54.8 ml LV SV MOD A4C:     110.0 ml LV SV MOD BP:      62.8 ml RIGHT VENTRICLE RV S prime:     11.40 cm/s TAPSE (M-mode): 1.3 cm LEFT ATRIUM             Index LA diam:        4.00 cm 2.00 cm/m LA Vol (A2C):   19.0 ml 9.49 ml/m LA Vol (A4C):   19.4 ml 9.69 ml/m LA Biplane Vol: 20.9 ml 10.44 ml/m  AORTIC VALVE AV Area (Vmax):    3.46 cm AV Area (Vmean):   3.10 cm AV Area (VTI):     3.45 cm AV Vmax:           109.00 cm/s AV Vmean:          71.800 cm/s AV  VTI:            0.206 m AV Peak Grad:      4.8 mmHg AV Mean Grad:      2.0 mmHg LVOT Vmax:         90.80 cm/s LVOT Vmean:        53.600 cm/s LVOT VTI:          0.171 m LVOT/AV VTI ratio: 0.83  AORTA Ao Asc diam: 3.70 cm MITRAL VALVE MV Area (PHT): 3.12 cm    SHUNTS MV Decel Time: 243 msec    Systemic VTI:  0.17 m MV E velocity: 72.70 cm/s  Systemic Diam: 2.30 cm MV A velocity: 87.00 cm/s MV E/A ratio:  0.84 Riley Lam MD Electronically signed by Riley Lam MD Signature Date/Time: 09/04/2023/3:32:02 PM    Final      Medical Consultants:   None.   Subjective:    Timothy Spencer feels great no complaints, wants to go to the bed  Objective:    Vitals:   09/04/23 2041 09/05/23 0457 09/05/23 0735 09/05/23 0835  BP: (!) 123/45 (!) 115/52 (!) 117/48   Pulse: 68 67 70 70  Resp: 18 16  18   Temp: 98.1 F (36.7 C) 97.7 F (36.5 C) 98 F (36.7 C)   TempSrc: Oral     SpO2: 99% 97% 98% 98%  Weight:      Height:       SpO2: 98 % O2 Flow Rate (L/min): 0 L/min   Intake/Output Summary (Last 24 hours) at 09/05/2023 0940 Last data filed at 09/05/2023 0900 Gross per 24 hour  Intake 360 ml  Output 1751 ml  Net -1391 ml   Filed Weights   09/02/23 1600 09/03/23 1108  Weight: 72.6 kg 74.7 kg    Exam: General exam: In no acute distress. Respiratory system: Good air movement and clear to auscultation. Cardiovascular system: S1 & S2 heard, RRR. No JVD. Gastrointestinal system: Abdomen is nondistended, soft and nontender.  Extremities: No pedal edema. Skin: No rashes, lesions or ulcers Psychiatry: Judgement and insight appear normal. Mood & affect appropriate.    Data Reviewed:    Labs: Basic Metabolic Panel: Recent Labs  Lab 09/02/23 1614 09/03/23 0334 09/04/23 0646 09/05/23 0557  NA 137 137 136 134*  K 4.1 3.6 3.6 4.1  CL 104 108 102 103  CO2 21* 20* 22 24  GLUCOSE 128* 139* 120* 96  BUN 32* 30* 25* 27*  CREATININE 1.83* 1.68* 1.38* 1.26*  CALCIUM 8.9  8.3* 8.7* 8.4*   GFR Estimated Creatinine Clearance: 46.9 mL/min (A) (by C-G formula based on SCr of 1.26 mg/dL (H)). Liver Function Tests: Recent Labs  Lab 09/02/23 1614  AST 45*  ALT 28  ALKPHOS 70  BILITOT 1.5*  PROT 6.3*  ALBUMIN 3.2*   No results for input(s): "LIPASE", "AMYLASE" in the last 168 hours. No results for input(s): "AMMONIA" in the last 168 hours. Coagulation profile Recent Labs  Lab 09/02/23 1614  INR 1.4*   COVID-19 Labs  No results for input(s): "DDIMER", "FERRITIN", "LDH", "CRP" in the last 72 hours.  Lab Results  Component Value Date   SARSCOV2NAA NEGATIVE 09/02/2023    CBC: Recent Labs  Lab 09/02/23 1614 09/03/23 0334 09/04/23 0646 09/05/23 0557  WBC 18.0* 12.1* 8.7 5.7  NEUTROABS 15.9*  --   --   --   HGB 13.5 11.2* 11.7* 10.2*  HCT 39.7 34.2* 33.7* 30.2*  MCV 95.0 97.7 92.3 92.9  PLT 119* 95* 85* 80*   Cardiac Enzymes: No results for input(s): "CKTOTAL", "CKMB", "CKMBINDEX", "TROPONINI" in the last 168 hours. BNP (last 3 results) No results for input(s): "PROBNP" in the last 8760 hours. CBG: No results for input(s): "GLUCAP" in the last 168 hours. D-Dimer: No results for input(s): "DDIMER" in the last 72 hours. Hgb A1c: No results for input(s): "HGBA1C" in the last 72 hours. Lipid Profile: No results for input(s): "CHOL", "HDL", "LDLCALC", "TRIG", "CHOLHDL", "LDLDIRECT" in the last 72 hours. Thyroid function studies: Recent Labs    09/05/23 0557  TSH 20.797*   Anemia work up: No results for input(s): "VITAMINB12", "FOLATE", "FERRITIN", "TIBC", "IRON", "RETICCTPCT" in the last 72 hours. Sepsis Labs: Recent Labs  Lab 09/02/23 1614 09/02/23 1630 09/02/23 1834 09/03/23 0334 09/04/23 0646 09/05/23 0557  WBC 18.0*  --   --  12.1* 8.7 5.7  LATICACIDVEN  --  2.6* 2.2*  --   --   --    Microbiology Recent Results (from the past 240 hours)  Resp panel by RT-PCR (RSV, Flu A&B, Covid) Anterior Nasal Swab     Status: None    Collection Time: 09/02/23  4:14 PM   Specimen: Anterior Nasal Swab  Result Value Ref Range Status   SARS Coronavirus 2 by RT PCR NEGATIVE NEGATIVE Final   Influenza A by PCR NEGATIVE NEGATIVE Final   Influenza B by PCR NEGATIVE NEGATIVE Final    Comment: (NOTE) The Xpert Xpress SARS-CoV-2/FLU/RSV plus assay is intended as an aid in the diagnosis of influenza from Nasopharyngeal swab specimens and should not be used as a sole basis for treatment. Nasal washings and aspirates are unacceptable for Xpert Xpress SARS-CoV-2/FLU/RSV testing.  Fact Sheet for Patients: BloggerCourse.com  Fact Sheet for Healthcare  Providers: SeriousBroker.it  This test is not yet approved or cleared by the [place] and has been authorized for detection and/or diagnosis of SARS-CoV-2 by FDA under an Emergency Use Authorization (EUA). This EUA will remain in effect (meaning this test can be used) for the duration of the COVID-19 declaration under Section 564(b)(1) of the Act, 21 U.S.C. section 360bbb-3(b)(1), unless the authorization is terminated or revoked.     Resp Syncytial Virus by PCR NEGATIVE NEGATIVE Final    Comment: (NOTE) Fact Sheet for Patients: BloggerCourse.com  Fact Sheet for Healthcare Providers: SeriousBroker.it  This test is not yet approved or cleared by the [place] FDA and has been authorized for detection and/or diagnosis of SARS-CoV-2 by FDA under an Emergency Use Authorization (EUA). This EUA will remain in effect (meaning this test can be used) for the duration of the COVID-19 declaration under Section 564(b)(1) of the Act, 21 U.S.C. section 360bbb-3(b)(1), unless the authorization is terminated or revoked.  Performed at [facility], [address], [place], [place] [address]   Blood Culture (routine x 2)     Status: Abnormal   Collection Time: 09/02/23   4:14 PM   Specimen: BLOOD  Result Value Ref Range Status   Specimen Description BLOOD SITE NOT SPECIFIED  Final   Special Requests   Final    BOTTLES DRAWN AEROBIC AND ANAEROBIC Blood Culture results may not be optimal due to an inadequate volume of blood received in culture bottles   Culture  Setup Time   Final    GRAM POSITIVE COCCI IN PAIRS IN CHAINS IN BOTH AEROBIC AND ANAEROBIC BOTTLES CRITICAL VALUE NOTED.  VALUE IS CONSISTENT WITH PREVIOUSLY REPORTED AND CALLED VALUE.    Culture (A)  Final    GROUP A STREP (S.PYOGENES) ISOLATED HEALTH DEPARTMENT NOTIFIED SUSCEPTIBILITIES PERFORMED ON PREVIOUS CULTURE WITHIN THE LAST 5 DAYS. Performed at [facility], [address] [place], [place] [address]    Report Status 09/05/2023 FINAL  Final  Urine Culture     Status: Abnormal (Preliminary result)   Collection Time: 09/02/23  4:14 PM   Specimen: Urine, Random  Result Value Ref Range Status   Specimen Description URINE, RANDOM  Final   Special Requests NONE Reflexed from [id number]  Final   Culture (A)  Final    >=100,000 COLONIES/mL PROTEUS MIRABILIS >=100,000 COLONIES/mL ENTEROCOCCUS FAECALIS SUSCEPTIBILITIES TO FOLLOW Performed at [facility], [address], [place], [place] [address]    Report Status PENDING  Incomplete   Organism ID, Bacteria ENTEROCOCCUS FAECALIS (A)  Final      Susceptibility   Enterococcus faecalis - MIC*    AMPICILLIN <=2 SENSITIVE Sensitive     NITROFURANTOIN <=16 SENSITIVE Sensitive     VANCOMYCIN 1 SENSITIVE Sensitive     * >=100,000 COLONIES/mL ENTEROCOCCUS FAECALIS  Blood Culture (routine x 2)     Status: Abnormal   Collection Time: 09/02/23  4:36 PM   Specimen: BLOOD LEFT ARM  Result Value Ref Range Status   Specimen Description BLOOD LEFT ARM  Final   Special Requests   Final    BOTTLES DRAWN AEROBIC AND ANAEROBIC Blood Culture results may not be optimal due to an inadequate volume of blood received in culture bottles   Culture   Setup Time   Final    GRAM POSITIVE COCCI IN PAIRS IN CHAINS IN BOTH AEROBIC AND ANAEROBIC BOTTLES CRITICAL RESULT CALLED TO, READ BACK BY AND VERIFIED WITH: PHARMD J LEDFORD  09/03/2023 @ 0604 BY AB    Culture (A)  Final    GROUP A STREP (S.PYOGENES) ISOLATED HEALTH DEPARTMENT NOTIFIED Performed at [facility], [address] [place], [place] [address]    Report Status 09/05/2023 FINAL  Final   Organism ID, Bacteria GROUP A STREP (S.PYOGENES) ISOLATED  Final      Susceptibility   Group a strep (s.pyogenes) isolated - MIC*    PENICILLIN <=0.06 SENSITIVE Sensitive     CEFTRIAXONE <=0.12 SENSITIVE Sensitive     ERYTHROMYCIN <=0.12 SENSITIVE Sensitive     LEVOFLOXACIN 1 SENSITIVE Sensitive     VANCOMYCIN 0.5 SENSITIVE Sensitive     * GROUP A STREP (S.PYOGENES) ISOLATED  Blood Culture ID Panel (Reflexed)     Status: Abnormal   Collection Time: 09/02/23  4:36 PM  Result Value Ref Range Status   Enterococcus faecalis NOT DETECTED NOT DETECTED Final   Enterococcus Faecium NOT DETECTED NOT DETECTED Final   Listeria monocytogenes NOT DETECTED NOT DETECTED Final   Staphylococcus species NOT DETECTED NOT DETECTED Final   Staphylococcus aureus (BCID) NOT DETECTED NOT DETECTED Final   Staphylococcus epidermidis NOT DETECTED NOT DETECTED Final   Staphylococcus lugdunensis NOT DETECTED NOT DETECTED Final   Streptococcus species DETECTED (A) NOT DETECTED Final    Comment: CRITICAL RESULT CALLED TO, READ BACK BY AND VERIFIED WITH: PHARMD J LEDFORD 09/03/2023 @ 0604 BY AB    Streptococcus agalactiae NOT DETECTED NOT DETECTED Final   Streptococcus pneumoniae NOT DETECTED NOT DETECTED Final   Streptococcus pyogenes DETECTED (A) NOT DETECTED Final    Comment: CRITICAL RESULT CALLED TO, READ BACK BY AND VERIFIED WITH: PHARMD J LEDFORD 09/03/2023 @ 0604 BY AB    A.calcoaceticus-baumannii NOT DETECTED NOT DETECTED Final   Bacteroides fragilis NOT DETECTED NOT DETECTED Final   Enterobacterales NOT  DETECTED NOT DETECTED Final   Enterobacter cloacae complex NOT DETECTED NOT DETECTED Final   Escherichia coli NOT DETECTED NOT DETECTED Final   Klebsiella aerogenes NOT DETECTED NOT DETECTED Final   Klebsiella oxytoca NOT DETECTED NOT DETECTED Final   Klebsiella pneumoniae NOT DETECTED NOT DETECTED Final   Proteus species NOT DETECTED NOT DETECTED Final   Salmonella species NOT DETECTED NOT DETECTED Final   Serratia marcescens NOT DETECTED NOT DETECTED Final   Haemophilus influenzae NOT DETECTED NOT DETECTED Final   Neisseria meningitidis NOT DETECTED NOT DETECTED Final   Pseudomonas aeruginosa NOT DETECTED NOT DETECTED Final   Stenotrophomonas maltophilia NOT DETECTED NOT DETECTED Final   Candida albicans NOT DETECTED NOT DETECTED Final   Candida auris NOT DETECTED NOT DETECTED Final   Candida glabrata NOT DETECTED NOT DETECTED Final   Candida krusei NOT DETECTED NOT DETECTED Final   Candida parapsilosis NOT DETECTED NOT DETECTED Final   Candida tropicalis NOT DETECTED NOT DETECTED Final   Cryptococcus neoformans/gattii NOT DETECTED NOT DETECTED Final    Comment: Performed at [facility], [address] [place], [place] [address]  C Difficile Quick Screen w PCR reflex     Status: None   Collection Time: 09/03/23 11:42 AM   Specimen: STOOL  Result Value Ref Range Status   C Diff antigen NEGATIVE NEGATIVE Final   C Diff toxin NEGATIVE NEGATIVE Final   C Diff interpretation No C. difficile detected.  Final    Comment: Performed at [facility], [address], [place], [place] [address]  Culture, blood (Routine X 2) w Reflex to ID Panel     Status: None (  Preliminary result)   Collection Time: 09/04/23  6:46 AM   Specimen: BLOOD  Result Value Ref Range Status   Specimen Description BLOOD BLOOD RIGHT ARM  Final   Special Requests   Final    BOTTLES DRAWN AEROBIC AND ANAEROBIC Blood Culture adequate volume   Culture   Final    NO GROWTH 1 DAY Performed at [facility], [address], [place], [place] [address]    Report Status PENDING  Incomplete  Culture, blood (Routine X 2) w Reflex to ID Panel     Status: None (Preliminary result)   Collection Time: 09/04/23  6:47 AM   Specimen: BLOOD  Result Value Ref Range Status   Specimen Description BLOOD BLOOD LEFT HAND  Final   Special Requests   Final    BOTTLES DRAWN AEROBIC AND ANAEROBIC Blood Culture adequate volume   Culture   Final    NO GROWTH 1 DAY Performed at [facility], [address], [place], [place] [address]    Report Status PENDING  Incomplete     Medications:    buPROPion  150 mg Oral q morning   feeding supplement  237 mL Oral TID BM   latanoprost  1 drop Both Eyes QHS   levothyroxine  112 mcg Oral Daily   mometasone-formoterol  2 puff Inhalation BID   And   umeclidinium bromide  1 puff Inhalation Daily   multivitamin with minerals  1 tablet Oral Daily   rOPINIRole  1 mg Oral QHS   rosuvastatin  5 mg Oral Daily   senna-docusate  1 tablet Oral QHS   sertraline  200 mg Oral Daily   tamsulosin  0.4 mg Oral Daily   Continuous Infusions:  pencillin G potassium IV 4 Million Units (09/05/23 6295)   promethazine (PHENERGAN) injection (IM or IVPB) 12.5 mg (09/03/23 1504)      LOS: 3 days   Marinda Elk  [facility]  09/05/2023, 9:40 AM

## 2023-09-05 NOTE — Care Management Important Message (Signed)
 Important Message  Patient Details  Name: Timothy Spencer MRN: [id number] Date of Birth: 07-21-1940   Important Message Given:  Yes - Medicare IM     Dorena Bodo 09/05/2023, 2:45 PM

## 2023-09-05 NOTE — Plan of Care (Signed)

## 2023-09-05 NOTE — Evaluation (Signed)
 Occupational Therapy Evaluation Patient Details Name: Timothy Spencer MRN: [id number] DOB: 07/31/1940 Today's Date: 09/05/2023   History of Present Illness   The pt is an 83 yo male presenting 4/6 with darker urine, admitted for sepsis and UTI. PMH includes: chronic suprapubic catheter, OSA, COPD not on O2, HTN, hemochromatosis, restless leg syndrome, peripheral neuropathy, and CAD.     Clinical Impressions Pt admitted based on above, and was seen based on problem list below. PTA pt was living with his wife and receiving min to mod assistance with ADLs and IADLs. Today pt is requiring set up  to mod assist for  ADLs. Bed mobility was CGA and functional transfers are  min  assist. Pt attempted to walk to bathroom to complete grooming, but reporting fatigue and requesting to complete tasks seated. Recommendation of HHOT to optimize independence levels. OT will continue to follow acutely to maximize functional independence.        If plan is discharge home, recommend the following:   A little help with walking and/or transfers;A little help with bathing/dressing/bathroom;Assistance with cooking/housework;Direct supervision/assist for medications management     Functional Status Assessment   Patient has had a recent decline in their functional status and demonstrates the ability to make significant improvements in function in a reasonable and predictable amount of time.     Equipment Recommendations   None recommended by OT (All DME needs met)     Recommendations for Other Services         Precautions/Restrictions   Precautions Precautions: Fall Recall of Precautions/Restrictions: Intact Restrictions Weight Bearing Restrictions Per Provider Order: No     Mobility Bed Mobility Overal bed mobility: Needs Assistance Bed Mobility: Supine to Sit     Supine to sit: Contact guard, Used rails     General bed mobility comments: increased time    Transfers Overall  transfer level: Needs assistance Equipment used: Rolling walker (2 wheels) Transfers: Sit to/from Stand, Bed to chair/wheelchair/BSC Sit to Stand: Min assist, From elevated surface, Mod assist     Step pivot transfers: Min assist     General transfer comment: Min assist to stand      Balance Overall balance assessment: Needs assistance Sitting-balance support: No upper extremity supported, Feet supported Sitting balance-Leahy Scale: Fair     Standing balance support: Bilateral upper extremity supported, During functional activity, Reliant on assistive device for balance Standing balance-Leahy Scale: Poor Standing balance comment: reliant on RW       ADL either performed or assessed with clinical judgement   ADL Overall ADL's : Needs assistance/impaired Eating/Feeding: Set up;Sitting   Grooming: Oral care;Set up;Sitting Grooming Details (indicate cue type and reason): Pt able to complete, but difficulty squeezing toothpaste         Upper Body Dressing : Set up;Sitting   Lower Body Dressing: Moderate assistance;Sit to/from stand Lower Body Dressing Details (indicate cue type and reason): Pt reports min assists with pants at baseline to thread legs, assist to pull over waist once standing Toilet Transfer: Minimal assistance;Ambulation;Rolling walker (2 wheels) Toilet Transfer Details (indicate cue type and reason): Simulated in room         Functional mobility during ADLs: Minimal assistance;Rolling walker (2 wheels) General ADL Comments: Pt fatigues easily     Vision Baseline Vision/History: 1 Wears glasses Vision Assessment?: No apparent visual deficits            Pertinent Vitals/Pain Pain Assessment Pain Assessment: No/denies pain     Extremity/Trunk Assessment Upper  Extremity Assessment Upper Extremity Assessment: Generalized weakness;RUE deficits/detail;LUE deficits/detail RUE Sensation: history of peripheral neuropathy RUE Coordination: decreased  fine motor LUE Sensation: history of peripheral neuropathy LUE Coordination: decreased fine motor   Lower Extremity Assessment Lower Extremity Assessment: Defer to PT evaluation   Cervical / Trunk Assessment Cervical / Trunk Assessment: Kyphotic   Communication Communication Communication: No apparent difficulties   Cognition Arousal: Alert Behavior During Therapy: WFL for tasks assessed/performed     Following commands: Intact       Cueing  General Comments   Cueing Techniques: Verbal cues  Pt sounds SOB but O2 stats on RA remin at 97%           Home Living Family/patient expects to be discharged to:: Private residence Living Arrangements: Spouse/significant other Available Help at Discharge: Family;Available 24 hours/day Type of Home: House Home Access: Stairs to enter [employer] of Steps: 1 Entrance Stairs-Rails: None Home Layout: Bed/bath upstairs;Full bath on main level;Multi-level Alternate Level Stairs-Number of Steps: flight but has a chair lift   Bathroom Shower/Tub: Producer, television/film/video: Standard Bathroom Accessibility: Yes How Accessible: Accessible via walker Home Equipment: Rolling Walker (2 wheels);Rollator (4 wheels);Shower seat;Grab bars - tub/shower;Hand held shower head;BSC/3in1;Grab bars - toilet          Prior Functioning/Environment Prior Level of Function : Needs assist;History of Falls (last six months)             Mobility Comments: uses  walker in the home and rollator in community, no  falls in last 6 months, but fell many times in last year. no falls since using walker. ADLs Comments: has maid intermittently bring in food and cleans, pt reports wife provides min assist for pants    OT Problem List: Decreased strength;Decreased range of motion;Decreased activity tolerance;Impaired balance (sitting and/or standing);Decreased coordination;Decreased safety awareness;Decreased knowledge of use of DME or AE    OT Treatment/Interventions: Self-care/ADL training;Therapeutic exercise;Energy conservation;DME and/or AE instruction;Therapeutic activities;Patient/family education;Balance training      OT Goals(Current goals can be found in the care plan section)   Acute Rehab OT Goals Patient Stated Goal: To go home OT Goal Formulation: With patient Time For Goal Achievement: 09/19/23 Potential to Achieve Goals: Good   OT Frequency:  Min 2X/week       AM-PAC OT "6 Clicks" Daily Activity     Outcome Measure Help from another person eating meals?: None Help from another person taking care of personal grooming?: A Little Help from another person toileting, which includes using toliet, bedpan, or urinal?: A Little Help from another person bathing (including washing, rinsing, drying)?: A Lot Help from another person to put on and taking off regular upper body clothing?: A Little Help from another person to put on and taking off regular lower body clothing?: A Lot 6 Click Score: 17   End of Session Equipment Utilized During Treatment: Gait belt;Rolling walker (2 wheels) Nurse Communication: Mobility status  Activity Tolerance: Patient limited by fatigue Patient left: in chair;with call bell/phone within reach;with chair alarm set  OT Visit Diagnosis: Unsteadiness on feet (R26.81);Other abnormalities of gait and mobility (R26.89);Repeated falls (R29.6);Muscle weakness (generalized) (M62.81);History of falling (Z91.81)                Time: 6440-3474 OT Time Calculation (min): 25 min Charges:  OT General Charges $OT Visit: 1 Visit OT Evaluation $OT Eval Moderate Complexity: 1 Mod  Timothy Spencer, OT  Acute Rehabilitation Services Office [phone] Secure chat preferred  Timothy Spencer 09/05/2023, 9:13 AM

## 2023-09-05 NOTE — Progress Notes (Addendum)
 Presents with sepsis / UTI. Hx of chronic suprapubic catheter, OSA, COPD, HTN, hemochromatosis, restless leg syndrome, peripheral neuropathy, and CAD.    09/05/23 1312  TOC Brief Assessment  Insurance and Status Reviewed  Patient has primary care physician Yes  Home environment has been reviewed From home with wife.  Prior level of function: PTA independent with ADL's. Outpatient therapy. DME: RW, rollator.  Prior/Current Home Services No current home services  Social Drivers of Health Review SDOH reviewed no interventions necessary  Readmission risk has been reviewed No  Transition of care needs transition of care needs identified, TOC will continue to follow   NCM shared PT/OT recommendation: home with home health services. Pt states agreeable to home health services. Pt without provider preference. Referral made with [place] with [facility] and accepted pending MD orders / face to face. Pt declined W/C if needed.  Pt states when d/c ready he will have a ride home by  a friend if  he is d/c before 10:30 am  on the day of .  TOC team following and will assist with needs. Gae Gallop RN, [place] [phone]

## 2023-09-05 NOTE — Progress Notes (Signed)
 [facility]  Date of Admission:  09/02/2023     Reason for Follow Up: Sepsis [facility])          ASSESSMENT:  [name] blood cultures from 09/04/2023 remain without growth in 1 day in the setting of group A strep bacteremia of unclear origin with concern for suprapubic catheter infection in the absence of other signs of infection.  Discussed plan of care to continue IV antibiotics for another day and then transition to oral antibiotics  for a total of 2 weeks following bacteremia.  Please ensure catheter has been changed in the setting of bacteremia.  Continue standard/universal precautions.  Remaining medical and supportive care per internal medicine.  PLAN:  Continue current dose of penicillin.  Monitor blood cultures for clearance of bacteremia. Ensure exchange of suprapubic catheter. Universal/standard precautions. Remaining medical and supportive care per internal medicine.  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 [facility]) Active Problems:   Hypothyroid   Hyperlipidemia with target LDL less than 70   CAD (coronary artery disease)   Essential hypertension   COPD (chronic obstructive pulmonary disease) (HCC)   Thrombocytopenia (HCC)   Chronic kidney disease, stage 3a (HCC)   Acute metabolic encephalopathy   BPH with indwelling suprapubic catheter   Streptococcal bacteremia   Suprapubic catheter (HCC)    buPROPion  150 mg Oral q morning   feeding supplement  237 mL Oral TID BM   latanoprost  1 drop Both Eyes QHS   levothyroxine  112 mcg Oral Daily   mometasone-formoterol  2 puff Inhalation BID   And   umeclidinium bromide  1 puff Inhalation Daily   multivitamin with minerals  1 tablet Oral Daily   rOPINIRole  1 mg Oral QHS   rosuvastatin  5 mg Oral Daily   senna-docusate  1 tablet Oral QHS   sertraline  100 mg Oral Daily   tamsulosin  0.4 mg Oral Daily    SUBJECTIVE:  Afebrile overnight with no acute events.  Feeling good  with no new concerns/complaints.  Tolerating antibiotics with no adverse side effects and denies Nausea, vomiting, or diarrhea.  Allergies  Allergen Reactions   Hydrochlorothiazide W-Spironolactone Other (See Comments)    Other Reaction(s): dizziness, fainting     Review of Systems: Review of Systems  Constitutional:  Negative for chills, fever and weight loss.  Respiratory:  Negative for cough, shortness of breath and wheezing.   Cardiovascular:  Negative for chest pain and leg swelling.  Gastrointestinal:  Negative for abdominal pain, constipation, diarrhea, nausea and vomiting.  Skin:  Negative for rash.      OBJECTIVE: Vitals:   09/05/23 0457 09/05/23 0735 09/05/23 0835 09/05/23 1442  BP: (!) 115/52 (!) 117/48  109/60  Pulse: 67 70 70 74  Resp: 16  18   Temp: 97.7 F (36.5 C) 98 F (36.7 C)  98 F (36.7 C)  TempSrc:      SpO2: 97% 98% 98% 96%  Weight:      Height:       Body mass index is 21.14 kg/m.  Physical Exam Constitutional:      General: He is not in acute distress.    Appearance: He is well-developed.  Cardiovascular:     Rate and Rhythm: Normal rate and regular rhythm.     Heart sounds: Normal heart sounds.  Pulmonary:     Effort: Pulmonary effort is normal.     Breath sounds: Normal breath sounds.  Skin:    General: Skin is warm and dry.  Neurological:     Mental Status: He is alert and oriented to person, place, and time.     Lab Results Lab Results  Component Value Date   WBC 5.7 09/05/2023   HGB 10.2 (L) 09/05/2023   HCT 30.2 (L) 09/05/2023   MCV 92.9 09/05/2023   PLT 80 (L) 09/05/2023    Lab Results  Component Value Date   CREATININE 1.26 (H) 09/05/2023   BUN 27 (H) 09/05/2023   NA 134 (L) 09/05/2023   K 4.1 09/05/2023   CL 103 09/05/2023   CO2 24 09/05/2023    Lab Results  Component Value Date   ALT 28 09/02/2023   AST 45 (H) 09/02/2023   ALKPHOS 70 09/02/2023   BILITOT 1.5 (H) 09/02/2023     Microbiology: Recent  Results (from the past 240 hours)  Resp panel by RT-PCR (RSV, Flu A&B, Covid) Anterior Nasal Swab     Status: None   Collection Time: 09/02/23  4:14 PM   Specimen: Anterior Nasal Swab  Result Value Ref Range Status   SARS Coronavirus 2 by RT PCR NEGATIVE NEGATIVE Final   Influenza A by PCR NEGATIVE NEGATIVE Final   Influenza B by PCR NEGATIVE NEGATIVE Final    Comment: (NOTE) The Xpert Xpress SARS-CoV-2/FLU/RSV plus assay is intended as an aid in the diagnosis of influenza from Nasopharyngeal swab specimens and should not be used as a sole basis for treatment. Nasal washings and aspirates are unacceptable for Xpert Xpress SARS-CoV-2/FLU/RSV testing.  Fact Sheet for Patients: BloggerCourse.com  Fact Sheet for Healthcare Providers: SeriousBroker.it  This test is not yet approved or cleared by the [place] FDA and has been authorized for detection and/or diagnosis of SARS-CoV-2 by FDA under an Emergency Use Authorization (EUA). This EUA will remain in effect (meaning this test can be used) for the duration of the COVID-19 declaration under Section 564(b)(1) of the Act, 21 U.S.C. section 360bbb-3(b)(1), unless the authorization is terminated or revoked.     Resp Syncytial Virus by PCR NEGATIVE NEGATIVE Final    Comment: (NOTE) Fact Sheet for Patients: BloggerCourse.com  Fact Sheet for Healthcare Providers: SeriousBroker.it  This test is not yet approved or cleared by the [place] FDA and has been authorized for detection and/or diagnosis of SARS-CoV-2 by FDA under an Emergency Use Authorization (EUA). This EUA will remain in effect (meaning this test can be used) for the duration of the COVID-19 declaration under Section 564(b)(1) of the Act, 21 U.S.C. section 360bbb-3(b)(1), unless the authorization is terminated or revoked.  Performed at [facility], [address] [place], [place] [address]   Blood Culture (routine x 2)     Status: Abnormal   Collection Time: 09/02/23  4:14 PM   Specimen: BLOOD  Result Value Ref Range Status   Specimen Description BLOOD SITE NOT SPECIFIED  Final   Special Requests   Final    BOTTLES DRAWN AEROBIC AND ANAEROBIC Blood Culture results may not be optimal due to an inadequate volume of blood received in culture bottles   Culture  Setup Time   Final    GRAM POSITIVE COCCI IN PAIRS IN CHAINS IN BOTH AEROBIC AND ANAEROBIC BOTTLES CRITICAL VALUE NOTED.  VALUE IS CONSISTENT WITH PREVIOUSLY REPORTED AND CALLED VALUE.    Culture (A)  Final    GROUP A STREP (S.PYOGENES) ISOLATED HEALTH DEPARTMENT NOTIFIED SUSCEPTIBILITIES PERFORMED ON PREVIOUS CULTURE WITHIN THE LAST 5  DAYS. Performed at [facility], [address], [place], [place] [address]    Report Status 09/05/2023 FINAL  Final  Urine Culture     Status: Abnormal (Preliminary result)   Collection Time: 09/02/23  4:14 PM   Specimen: Urine, Random  Result Value Ref Range Status   Specimen Description URINE, RANDOM  Final   Special Requests NONE Reflexed from [id number]  Final   Culture (A)  Final    >=100,000 COLONIES/mL PROTEUS MIRABILIS >=100,000 COLONIES/mL ENTEROCOCCUS FAECALIS SUSCEPTIBILITIES TO FOLLOW Performed at [facility], [address] [place], [place] [address]    Report Status PENDING  Incomplete   Organism ID, Bacteria ENTEROCOCCUS FAECALIS (A)  Final      Susceptibility   Enterococcus faecalis - MIC*    AMPICILLIN <=2 SENSITIVE Sensitive     NITROFURANTOIN <=16 SENSITIVE Sensitive     VANCOMYCIN 1 SENSITIVE Sensitive     * >=100,000 COLONIES/mL ENTEROCOCCUS FAECALIS  Blood Culture (routine x 2)     Status: Abnormal   Collection Time: 09/02/23  4:36 PM   Specimen: BLOOD LEFT ARM  Result Value Ref Range Status   Specimen Description BLOOD LEFT ARM  Final   Special Requests   Final    BOTTLES DRAWN AEROBIC AND ANAEROBIC  Blood Culture results may not be optimal due to an inadequate volume of blood received in culture bottles   Culture  Setup Time   Final    GRAM POSITIVE COCCI IN PAIRS IN CHAINS IN BOTH AEROBIC AND ANAEROBIC BOTTLES CRITICAL RESULT CALLED TO, READ BACK BY AND VERIFIED WITH: PHARMD J LEDFORD 09/03/2023 @ 0604 BY AB    Culture (A)  Final    GROUP A STREP (S.PYOGENES) ISOLATED HEALTH DEPARTMENT NOTIFIED Performed at [facility], [address], [place], [place] [address]    Report Status 09/05/2023 FINAL  Final   Organism ID, Bacteria GROUP A STREP (S.PYOGENES) ISOLATED  Final      Susceptibility   Group a strep (s.pyogenes) isolated - MIC*    PENICILLIN <=0.06 SENSITIVE Sensitive     CEFTRIAXONE <=0.12 SENSITIVE Sensitive     ERYTHROMYCIN <=0.12 SENSITIVE Sensitive     LEVOFLOXACIN 1 SENSITIVE Sensitive     VANCOMYCIN 0.5 SENSITIVE Sensitive     * GROUP A STREP (S.PYOGENES) ISOLATED  Blood Culture ID Panel (Reflexed)     Status: Abnormal   Collection Time: 09/02/23  4:36 PM  Result Value Ref Range Status   Enterococcus faecalis NOT DETECTED NOT DETECTED Final   Enterococcus Faecium NOT DETECTED NOT DETECTED Final   Listeria monocytogenes NOT DETECTED NOT DETECTED Final   Staphylococcus species NOT DETECTED NOT DETECTED Final   Staphylococcus aureus (BCID) NOT DETECTED NOT DETECTED Final   Staphylococcus epidermidis NOT DETECTED NOT DETECTED Final   Staphylococcus lugdunensis NOT DETECTED NOT DETECTED Final   Streptococcus species DETECTED (A) NOT DETECTED Final    Comment: CRITICAL RESULT CALLED TO, READ BACK BY AND VERIFIED WITH: PHARMD J LEDFORD 09/03/2023 @ 0604 BY AB    Streptococcus agalactiae NOT DETECTED NOT DETECTED Final   Streptococcus pneumoniae NOT DETECTED NOT DETECTED Final   Streptococcus pyogenes DETECTED (A) NOT DETECTED Final    Comment: CRITICAL RESULT CALLED TO, READ BACK BY AND VERIFIED WITH: PHARMD J LEDFORD 09/03/2023 @ 0604 BY AB     A.calcoaceticus-baumannii NOT DETECTED NOT DETECTED Final   Bacteroides fragilis NOT DETECTED NOT DETECTED Final   Enterobacterales NOT DETECTED NOT DETECTED Final  Enterobacter cloacae complex NOT DETECTED NOT DETECTED Final   Escherichia coli NOT DETECTED NOT DETECTED Final   Klebsiella aerogenes NOT DETECTED NOT DETECTED Final   Klebsiella oxytoca NOT DETECTED NOT DETECTED Final   Klebsiella pneumoniae NOT DETECTED NOT DETECTED Final   Proteus species NOT DETECTED NOT DETECTED Final   Salmonella species NOT DETECTED NOT DETECTED Final   Serratia marcescens NOT DETECTED NOT DETECTED Final   Haemophilus influenzae NOT DETECTED NOT DETECTED Final   Neisseria meningitidis NOT DETECTED NOT DETECTED Final   Pseudomonas aeruginosa NOT DETECTED NOT DETECTED Final   Stenotrophomonas maltophilia NOT DETECTED NOT DETECTED Final   Candida albicans NOT DETECTED NOT DETECTED Final   Candida auris NOT DETECTED NOT DETECTED Final   Candida glabrata NOT DETECTED NOT DETECTED Final   Candida krusei NOT DETECTED NOT DETECTED Final   Candida parapsilosis NOT DETECTED NOT DETECTED Final   Candida tropicalis NOT DETECTED NOT DETECTED Final   Cryptococcus neoformans/gattii NOT DETECTED NOT DETECTED Final    Comment: Performed at [facility], [address], [place], [place] [address]  C Difficile Quick Screen w PCR reflex     Status: None   Collection Time: 09/03/23 11:42 AM   Specimen: STOOL  Result Value Ref Range Status   C Diff antigen NEGATIVE NEGATIVE Final   C Diff toxin NEGATIVE NEGATIVE Final   C Diff interpretation No C. difficile detected.  Final    Comment: Performed at [facility], [address] [place], [place] [address]  Culture, blood (Routine X 2) w Reflex to ID Panel     Status: None (Preliminary result)   Collection Time: 09/04/23  6:46 AM   Specimen: BLOOD  Result Value Ref Range Status   Specimen Description BLOOD BLOOD RIGHT ARM  Final   Special Requests   Final     BOTTLES DRAWN AEROBIC AND ANAEROBIC Blood Culture adequate volume   Culture   Final    NO GROWTH 1 DAY Performed at [facility], [address], [place], [place] [address]    Report Status PENDING  Incomplete  Culture, blood (Routine X 2) w Reflex to ID Panel     Status: None (Preliminary result)   Collection Time: 09/04/23  6:47 AM   Specimen: BLOOD  Result Value Ref Range Status   Specimen Description BLOOD BLOOD LEFT HAND  Final   Special Requests   Final    BOTTLES DRAWN AEROBIC AND ANAEROBIC Blood Culture adequate volume   Culture   Final    NO GROWTH 1 DAY Performed at [facility], [address], [place], [place] [address]    Report Status PENDING  Incomplete     Marcos Eke, NP [facility]  09/05/2023  3:09 PM

## 2023-09-06 ENCOUNTER — Other Ambulatory Visit (HOSPITAL_COMMUNITY): Payer: Self-pay

## 2023-09-06 DIAGNOSIS — I10 Essential (primary) hypertension: Secondary | ICD-10-CM | POA: Diagnosis not present

## 2023-09-06 DIAGNOSIS — N1831 Chronic kidney disease, stage 3a: Secondary | ICD-10-CM | POA: Diagnosis not present

## 2023-09-06 DIAGNOSIS — I2510 Atherosclerotic heart disease of native coronary artery without angina pectoris: Secondary | ICD-10-CM | POA: Diagnosis not present

## 2023-09-06 DIAGNOSIS — A400 Sepsis due to streptococcus, group A: Secondary | ICD-10-CM | POA: Diagnosis not present

## 2023-09-06 LAB — URINE CULTURE: Culture: >=100000 — AB

## 2023-09-06 LAB — CBC
HCT: 30.6 % — ABNORMAL LOW (ref 39.0–52.0)
Hemoglobin: 10.4 g/dL — ABNORMAL LOW (ref 13.0–17.0)
MCH: 32 pg (ref 26.0–34.0)
MCHC: 34 g/dL (ref 30.0–36.0)
MCV: 94.2 fL (ref 80.0–100.0)
Platelets: 94 10*3/uL — ABNORMAL LOW (ref 150–400)
RBC: 3.25 MIL/uL — ABNORMAL LOW (ref 4.22–5.81)
RDW: 14.2 % (ref 11.5–15.5)
WBC: 5.6 10*3/uL (ref 4.0–10.5)
nRBC: 0 % (ref 0.0–0.2)

## 2023-09-06 MED ORDER — AMOXICILLIN 500 MG PO TABS
1000.0000 mg | ORAL_TABLET | Freq: Three times a day (TID) | ORAL | 0 refills | Status: DC
  Filled 2023-09-06: qty 60, 10d supply, fill #0

## 2023-09-06 MED ORDER — AMOXICILLIN 500 MG PO CAPS
1000.0000 mg | ORAL_CAPSULE | Freq: Three times a day (TID) | ORAL | 0 refills | Status: AC
  Filled 2023-09-06: qty 72, 12d supply, fill #0

## 2023-09-06 MED ORDER — AMOXICILLIN 875 MG PO TABS
875.0000 mg | ORAL_TABLET | Freq: Two times a day (BID) | ORAL | 0 refills | Status: DC
  Filled 2023-09-06: qty 20, 10d supply, fill #0

## 2023-09-06 NOTE — Discharge Summary (Addendum)
 Physician Discharge Summary  RAKEEN GAILLARD ZOX:[id number] DOB: 03-31-1941 DOA: 09/02/2023  PCP: Merri Brunette, MD  Admit date: 09/02/2023 Discharge date: 09/06/2023  Admitted From: Home\ Disposition:  Home  Recommendations for Outpatient Follow-up:  Follow up with Hand surgery in 1-2 weeks Please obtain BMP/CBC in one week Follow-up with PCP in 4 weeks repeat a TSH as an outpatient.   Home Health:Yes Equipment/Devices:None  Discharge Condition:Stable CODE STATUS:Full Diet recommendation: Heart Healthy   Brief/Interim Summary:  83 y.o. male past medical history of CAD status post PCI.  5 years ago, essential hypertension, history of TIA, COPD not on oxygen, BPH status post suprapubic catheter, chronic kidney disease stage IIIa with a baseline creatinine around 1.2-1.6 comes in for dark urine and discomfort in his suprapubic catheter was found to be febrile in the ED admitted for UTI and sepsis.   Discharge Diagnoses:  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 [facility]) Active Problems:   CAD (coronary artery disease)   Essential hypertension   COPD (chronic obstructive pulmonary disease) (HCC)   Chronic kidney disease, stage 3a (HCC)   Hypothyroid   Hyperlipidemia with target LDL less than 70   Thrombocytopenia (HCC)   Acute metabolic encephalopathy   BPH with indwelling suprapubic catheter   Bacteremia due to Streptococcus   Suprapubic catheter (HCC)   Bacterial infection due to streptococcus, group A  Severe sepsis due to strep pyogenes and bacteremia: Blood cultures on 09/02/2023 grew GBA pansensitive. ID was consulted recommended a 2D echo that showed no vegetation.  Given high-dose IV penicillin. Surveillance blood cultures on 09/03/2023 have been negative till date. He will continue high-dose amoxicillin as an outpatient. PT was consulted who recommended home health PT.  CAD/CVA/essential hypertension/hyperlipidemia: No changes made to his medication  continue current regimen.  COPD: Continue inhalers per  Acute kidney injury on chronic kidney see stage IIIa: He was started on IV fluids his creatinine returned to baseline.  Hypothyroidism: Continue current dose of Synthroid, TSH were checked in the hospital and it was elevated at 21 need to be repeated as an outpatient. He remained asymptomatic.  BPH: in the setting of indwelling Foley catheter No changes made to his medications Foley was exchanged.  Acute metabolic encephalopathy: Likely due to infectious etiology now resolved.  Thrombocytopenia: Likely due to infectious etiology now improving.  Normocytic anemia: Hemoglobin is stable.  Moderate protein caloric malnutrition: Noted.  Discharge Instructions  Discharge Instructions     Diet - low sodium heart healthy   Complete by: As directed    Increase activity slowly   Complete by: As directed       Allergies as of 09/06/2023       Reactions   Hydrochlorothiazide W-spironolactone Other (See Comments)   Other Reaction(s): dizziness, fainting        Medication List     TAKE these medications    albuterol 108 (90 Base) MCG/ACT inhaler Commonly known as: VENTOLIN HFA INHALE 2 PUFFS INTO THE LUNGS EVERY 6 HOURS AS NEEDED FOR WHEEZING OR SHORTNESS OF BREATH   amLODipine 10 MG tablet Commonly known as: NORVASC Take 10 mg by mouth daily.   amoxicillin 500 MG tablet Commonly known as: AMOXIL Take 2 tablets (1,000 mg total) by mouth in the morning, at noon, and at bedtime for 10 days.   Breztri Aerosphere 160-9-4.8 MCG/ACT Aero inhaler Generic drug: budeson-glycopyrrolate-formoterol Inhale 2 puffs into the lungs in the morning and at bedtime.   buPROPion 150 MG 24 hr tablet  Commonly known as: WELLBUTRIN XL Take 150 mg by mouth every morning.   feeding supplement Liqd Take 237 mLs by mouth 2 (two) times daily between meals.   latanoprost 0.005 % ophthalmic solution Commonly known as:  XALATAN Place 1 drop into both eyes at bedtime.   levothyroxine 112 MCG tablet Commonly known as: SYNTHROID Take 112 mcg by mouth daily.   rOPINIRole 1 MG tablet Commonly known as: REQUIP Take 1 mg by mouth at bedtime.   rosuvastatin 5 MG tablet Commonly known as: CRESTOR Take 5 mg by mouth daily.   senna-docusate 8.6-50 MG tablet Commonly known as: Senokot-S Take 1 tablet by mouth at bedtime as needed for mild constipation.   sertraline 100 MG tablet Commonly known as: ZOLOFT Take 200 mg by mouth daily.   tamsulosin 0.4 MG Caps capsule Commonly known as: FLOMAX Take 0.4 mg by mouth in the morning and at bedtime.   zolpidem 6.25 MG CR tablet Commonly known as: AMBIEN CR Take 6.25 mg by mouth at bedtime as needed.        Follow-up Information     Merri Brunette, MD Follow up.   Specialty: Internal Medicine Contact information: [address] [place] [address] [phone]                Allergies  Allergen Reactions   Hydrochlorothiazide W-Spironolactone Other (See Comments)    Other Reaction(s): dizziness, fainting    Consultations: ID Hand surgery   Procedures/Studies: ECHOCARDIOGRAM COMPLETE BUBBLE STUDY Result Date: 09/04/2023    ECHOCARDIOGRAM REPORT   Patient Name:   DAILEN MCCLISH Date of Exam: 09/04/2023 Medical Rec #:  [id number]       Height:       74.0 in Accession #:    [id number]      Weight:       164.7 lb Date of Birth:  July 21, 1940      BSA:          2.001 m Patient Age:    82 years        BP:           113/54 mmHg Patient Gender: M               HR:           83 bpm. Exam Location:  Inpatient Procedure: 2D Echo, Color Doppler and Cardiac Doppler (Both Spectral and Color            Flow Doppler were utilized during procedure). Indications:    Bacteremia  History:        Patient has prior history of Echocardiogram examinations, most                 recent 10/28/2021.  Sonographer:    Maxwell Marion Referring Phys: [id number] GREGORY  D CALONE IMPRESSIONS  1. Left ventricular ejection fraction, by estimation, is 60 to 65%. The left ventricle has normal function. The left ventricle has no regional wall motion abnormalities. There is moderate asymmetric left ventricular hypertrophy. Left ventricular diastolic parameters are consistent with Grade I diastolic dysfunction (impaired relaxation).  2. Right ventricular systolic function is normal. The right ventricular size is normal.  3. Poor quality bubble study.  4. There is no evidence of cardiac tamponade.  5. The mitral valve is normal in structure. No evidence of mitral valve regurgitation. No evidence of mitral stenosis.  6. The aortic valve was not well visualized. Aortic valve regurgitation is not visualized. No aortic  stenosis is present. Comparison(s): No significant change from prior study. Prior images reviewed side by side. Present study is more technically difficult. Conclusion(s)/Recommendation(s): No evidence of valvular vegetations on this transthoracic echocardiogram. Consider a transesophageal echocardiogram to exclude infective endocarditis if clinically indicated. FINDINGS  Left Ventricle: Left ventricular ejection fraction, by estimation, is 60 to 65%. The left ventricle has normal function. The left ventricle has no regional wall motion abnormalities. Definity contrast agent was given IV to delineate the left ventricular  endocardial borders. Strain was performed and the global longitudinal strain is indeterminate. The left ventricular internal cavity size was normal in size. There is moderate asymmetric left ventricular hypertrophy. Left ventricular diastolic parameters  are consistent with Grade I diastolic dysfunction (impaired relaxation). Right Ventricle: The right ventricular size is normal. No increase in right ventricular wall thickness. Right ventricular systolic function is normal. Left Atrium: Left atrial size was normal in size. Right Atrium: Right atrial size was  normal in size. Pericardium: Trivial pericardial effusion is present. There is no evidence of cardiac tamponade. Mitral Valve: The mitral valve is normal in structure. No evidence of mitral valve regurgitation. No evidence of mitral valve stenosis. Tricuspid Valve: The tricuspid valve is normal in structure. Tricuspid valve regurgitation is not demonstrated. No evidence of tricuspid stenosis. Aortic Valve: The aortic valve was not well visualized. Aortic valve regurgitation is not visualized. No aortic stenosis is present. Aortic valve mean gradient measures 2.0 mmHg. Aortic valve peak gradient measures 4.8 mmHg. Aortic valve area, by VTI measures 3.45 cm. Pulmonic Valve: The pulmonic valve was normal in structure. Pulmonic valve regurgitation is not visualized. No evidence of pulmonic stenosis. Aorta: The aortic root and ascending aorta are structurally normal, with no evidence of dilitation. IAS/Shunts: The atrial septum is grossly normal. Additional Comments: 3D was performed not requiring image post processing on an independent workstation and was indeterminate.  LEFT VENTRICLE PLAX 2D LVIDd:         3.90 cm      Diastology LVIDs:         2.30 cm      LV e' medial:    5.77 cm/s LV PW:         0.70 cm      LV E/e' medial:  12.6 LV IVS:        1.40 cm      LV e' lateral:   6.20 cm/s LVOT diam:     2.30 cm      LV E/e' lateral: 11.7 LV SV:         71 LV SV Index:   36 LVOT Area:     4.15 cm  LV Volumes (MOD) LV vol d, MOD A2C: 96.3 ml LV vol d, MOD A4C: 110.0 ml LV vol s, MOD A2C: 41.5 ml LV vol s, MOD A4C: 37.3 ml LV SV MOD A2C:     54.8 ml LV SV MOD A4C:     110.0 ml LV SV MOD BP:      62.8 ml RIGHT VENTRICLE RV S prime:     11.40 cm/s TAPSE (M-mode): 1.3 cm LEFT ATRIUM             Index LA diam:        4.00 cm 2.00 cm/m LA Vol (A2C):   19.0 ml 9.49 ml/m LA Vol (A4C):   19.4 ml 9.69 ml/m LA Biplane Vol: 20.9 ml 10.44 ml/m  AORTIC VALVE AV Area (Vmax):    3.46 cm AV Area (Vmean):  3.10 cm AV Area (VTI):      3.45 cm AV Vmax:           109.00 cm/s AV Vmean:          71.800 cm/s AV VTI:            0.206 m AV Peak Grad:      4.8 mmHg AV Mean Grad:      2.0 mmHg LVOT Vmax:         90.80 cm/s LVOT Vmean:        53.600 cm/s LVOT VTI:          0.171 m LVOT/AV VTI ratio: 0.83  AORTA Ao Asc diam: 3.70 cm MITRAL VALVE MV Area (PHT): 3.12 cm    SHUNTS MV Decel Time: 243 msec    Systemic VTI:  0.17 m MV E velocity: 72.70 cm/s  Systemic Diam: 2.30 cm MV A velocity: 87.00 cm/s MV E/A ratio:  0.84 Riley Lam MD Electronically signed by Riley Lam MD Signature Date/Time: 09/04/2023/3:32:02 PM    Final    CT ABDOMEN PELVIS WO CONTRAST Result Date: 09/02/2023 CLINICAL DATA:  Sepsis EXAM: CT ABDOMEN AND PELVIS WITHOUT CONTRAST TECHNIQUE: Multidetector CT imaging of the abdomen and pelvis was performed following the standard protocol without IV contrast. RADIATION DOSE REDUCTION: This exam was performed according to the departmental dose-optimization program which includes automated exposure control, adjustment of the mA and/or kV according to patient size and/or use of iterative reconstruction technique. COMPARISON:  CT abdomen pelvis 08/05/2019. FINDINGS: Lower chest: Atherosclerotic plaque including coronary calcification. Elevated right hemidiaphragm with right base atelectasis. No acute abnormality. Hepatobiliary: No focal liver abnormality. Calcified gallstone noted within the gallbladder lumen. No gallbladder wall thickening or pericholecystic fluid. No biliary dilatation. Pancreas: No focal lesion. Normal pancreatic contour. No surrounding inflammatory changes. No main pancreatic ductal dilatation. Spleen: Normal in size without focal abnormality. Adrenals/Urinary Tract: No adrenal nodule bilaterally. No nephrolithiasis and no hydronephrosis. Fluid density lesions likely represent simple renal cysts. Simple renal cysts, in the absence of clinically indicated signs/symptoms, require no independent follow-up.  No ureterolithiasis or hydroureter. Suprapubic catheter with inflated balloon and tip terminating within the urinary bladder lumen. Urinary bladder fully decompressed. Question perivesicular fat stranding. Stomach/Bowel: Stomach is within normal limits. No evidence of bowel wall thickening or dilatation. Marked colonic diverticulosis. Appendix appears normal. Vascular/Lymphatic: No abdominal aorta or iliac aneurysm. Severe atherosclerotic plaque of the aorta and its branches. No abdominal, pelvic, or inguinal lymphadenopathy. Reproductive: No mass. Other: No intraperitoneal free fluid. No intraperitoneal free gas. No organized fluid collection. Musculoskeletal: Small fat containing left inguinal hernia. No suspicious lytic or blastic osseous lesions. No acute displaced fracture. Multilevel degenerative changes of the spine. Partially visualized total left hip arthroplasty. Partially visualized surgical hardware within the right proximal femur with findings suggestive of some removal of prior surgical hardware. IMPRESSION: 1. Suprapubic catheter in appropriate position with urinary bladder lumen fully decompressed. Question perivesicular fat stranding. Correlate for infection with urinalysis. 2. Cholelithiasis with no CT evidence of acute cholecystitis. 3. Colonic diverticulosis with no acute diverticulitis. 4.  Aortic Atherosclerosis (ICD10-I70.0). 5. Limited evaluation on this noncontrast study Electronically Signed   By: Tish Frederickson M.D.   On: 09/02/2023 20:08   DG Chest Port 1 View Result Date: 09/02/2023 CLINICAL DATA:  Possible sepsis. EXAM: PORTABLE CHEST 1 VIEW COMPARISON:  08/03/2022. FINDINGS: The heart size and mediastinal contours are within normal limits. There is atherosclerotic calcification of the aorta. Chronic elevation of the  right diaphragm is noted. There is a mild atelectasis or scarring at the lung bases. No consolidation, effusion, or pneumothorax. No acute osseous abnormality.  IMPRESSION: Stable chest with no active disease. Electronically Signed   By: Thornell Sartorius M.D.   On: 09/02/2023 16:54   (Echo, Carotid, EGD, Colonoscopy, ERCP)    Subjective: No complaints  Discharge Exam: Vitals:   09/06/23 0755 09/06/23 0833  BP:  134/61  Pulse:  67  Resp:    Temp:  97.9 F (36.6 C)  SpO2: 97% 96%   Vitals:   09/06/23 0432 09/06/23 0754 09/06/23 0755 09/06/23 0833  BP: 131/63   134/61  Pulse: 69   67  Resp: 18     Temp: 97.9 F (36.6 C)   97.9 F (36.6 C)  TempSrc: Oral   Oral  SpO2: 97% 97% 97% 96%  Weight:      Height:        General: Pt is alert, awake, not in acute distress Cardiovascular: RRR, S1/S2 +, no rubs, no gallops Respiratory: CTA bilaterally, no wheezing, no rhonchi Abdominal: Soft, NT, ND, bowel sounds + Extremities: no edema, no cyanosis    The results of significant diagnostics from this hospitalization (including imaging, microbiology, ancillary and laboratory) are listed below for reference.     Microbiology: Recent Results (from the past 240 hours)  Resp panel by RT-PCR (RSV, Flu A&B, Covid) Anterior Nasal Swab     Status: None   Collection Time: 09/02/23  4:14 PM   Specimen: Anterior Nasal Swab  Result Value Ref Range Status   SARS Coronavirus 2 by RT PCR NEGATIVE NEGATIVE Final   Influenza A by PCR NEGATIVE NEGATIVE Final   Influenza B by PCR NEGATIVE NEGATIVE Final    Comment: (NOTE) The Xpert Xpress SARS-CoV-2/FLU/RSV plus assay is intended as an aid in the diagnosis of influenza from Nasopharyngeal swab specimens and should not be used as a sole basis for treatment. Nasal washings and aspirates are unacceptable for Xpert Xpress SARS-CoV-2/FLU/RSV testing.  Fact Sheet for Patients: BloggerCourse.com  Fact Sheet for Healthcare Providers: SeriousBroker.it  This test is not yet approved or cleared by the [place] FDA and has been authorized for detection  and/or diagnosis of SARS-CoV-2 by FDA under an Emergency Use Authorization (EUA). This EUA will remain in effect (meaning this test can be used) for the duration of the COVID-19 declaration under Section 564(b)(1) of the Act, 21 U.S.C. section 360bbb-3(b)(1), unless the authorization is terminated or revoked.     Resp Syncytial Virus by PCR NEGATIVE NEGATIVE Final    Comment: (NOTE) Fact Sheet for Patients: BloggerCourse.com  Fact Sheet for Healthcare Providers: SeriousBroker.it  This test is not yet approved or cleared by the [place] FDA and has been authorized for detection and/or diagnosis of SARS-CoV-2 by FDA under an Emergency Use Authorization (EUA). This EUA will remain in effect (meaning this test can be used) for the duration of the COVID-19 declaration under Section 564(b)(1) of the Act, 21 U.S.C. section 360bbb-3(b)(1), unless the authorization is terminated or revoked.  Performed at [facility], [address] [place], [place] [address]   Blood Culture (routine x 2)     Status: Abnormal   Collection Time: 09/02/23  4:14 PM   Specimen: BLOOD  Result Value Ref Range Status   Specimen Description BLOOD SITE NOT SPECIFIED  Final   Special Requests   Final    BOTTLES DRAWN AEROBIC AND ANAEROBIC Blood Culture results may not  be optimal due to an inadequate volume of blood received in culture bottles   Culture  Setup Time   Final    GRAM POSITIVE COCCI IN PAIRS IN CHAINS IN BOTH AEROBIC AND ANAEROBIC BOTTLES CRITICAL VALUE NOTED.  VALUE IS CONSISTENT WITH PREVIOUSLY REPORTED AND CALLED VALUE.    Culture (A)  Final    GROUP A STREP (S.PYOGENES) ISOLATED HEALTH DEPARTMENT NOTIFIED SUSCEPTIBILITIES PERFORMED ON PREVIOUS CULTURE WITHIN THE LAST 5 DAYS. Performed at [facility], [address] [place], [place] [address]    Report Status 09/05/2023 FINAL  Final  Urine Culture     Status: Abnormal  (Preliminary result)   Collection Time: 09/02/23  4:14 PM   Specimen: Urine, Random  Result Value Ref Range Status   Specimen Description URINE, RANDOM  Final   Special Requests NONE Reflexed from [phone]  Final   Culture (A)  Final    >=100,000 COLONIES/mL PROTEUS MIRABILIS >=100,000 COLONIES/mL ENTEROCOCCUS FAECALIS SUSCEPTIBILITIES TO FOLLOW Performed at [facility], [address], [place], [place] [address]    Report Status PENDING  Incomplete   Organism ID, Bacteria ENTEROCOCCUS FAECALIS (A)  Final      Susceptibility   Enterococcus faecalis - MIC*    AMPICILLIN <=2 SENSITIVE Sensitive     NITROFURANTOIN <=16 SENSITIVE Sensitive     VANCOMYCIN 1 SENSITIVE Sensitive     * >=100,000 COLONIES/mL ENTEROCOCCUS FAECALIS  Blood Culture (routine x 2)     Status: Abnormal   Collection Time: 09/02/23  4:36 PM   Specimen: BLOOD LEFT ARM  Result Value Ref Range Status   Specimen Description BLOOD LEFT ARM  Final   Special Requests   Final    BOTTLES DRAWN AEROBIC AND ANAEROBIC Blood Culture results may not be optimal due to an inadequate volume of blood received in culture bottles   Culture  Setup Time   Final    GRAM POSITIVE COCCI IN PAIRS IN CHAINS IN BOTH AEROBIC AND ANAEROBIC BOTTLES CRITICAL RESULT CALLED TO, READ BACK BY AND VERIFIED WITH: PHARMD J LEDFORD 09/03/2023 @ 0604 BY AB    Culture (A)  Final    GROUP A STREP (S.PYOGENES) ISOLATED HEALTH DEPARTMENT NOTIFIED Performed at [facility], [address], [place], [place] [address]    Report Status 09/05/2023 FINAL  Final   Organism ID, Bacteria GROUP A STREP (S.PYOGENES) ISOLATED  Final      Susceptibility   Group a strep (s.pyogenes) isolated - MIC*    PENICILLIN <=0.06 SENSITIVE Sensitive     CEFTRIAXONE <=0.12 SENSITIVE Sensitive     ERYTHROMYCIN <=0.12 SENSITIVE Sensitive     LEVOFLOXACIN 1 SENSITIVE Sensitive     VANCOMYCIN 0.5 SENSITIVE Sensitive     * GROUP A STREP (S.PYOGENES) ISOLATED  Blood  Culture ID Panel (Reflexed)     Status: Abnormal   Collection Time: 09/02/23  4:36 PM  Result Value Ref Range Status   Enterococcus faecalis NOT DETECTED NOT DETECTED Final   Enterococcus Faecium NOT DETECTED NOT DETECTED Final   Listeria monocytogenes NOT DETECTED NOT DETECTED Final   Staphylococcus species NOT DETECTED NOT DETECTED Final   Staphylococcus aureus (BCID) NOT DETECTED NOT DETECTED Final   Staphylococcus epidermidis NOT DETECTED NOT DETECTED Final   Staphylococcus lugdunensis NOT DETECTED NOT DETECTED Final   Streptococcus species DETECTED (A) NOT DETECTED Final    Comment: CRITICAL RESULT CALLED TO, READ BACK BY AND VERIFIED WITH: PHARMD J LEDFORD 09/03/2023 @ 0604 BY AB  Streptococcus agalactiae NOT DETECTED NOT DETECTED Final   Streptococcus pneumoniae NOT DETECTED NOT DETECTED Final   Streptococcus pyogenes DETECTED (A) NOT DETECTED Final    Comment: CRITICAL RESULT CALLED TO, READ BACK BY AND VERIFIED WITH: PHARMD J LEDFORD 09/03/2023 @ 0604 BY AB    A.calcoaceticus-baumannii NOT DETECTED NOT DETECTED Final   Bacteroides fragilis NOT DETECTED NOT DETECTED Final   Enterobacterales NOT DETECTED NOT DETECTED Final   Enterobacter cloacae complex NOT DETECTED NOT DETECTED Final   Escherichia coli NOT DETECTED NOT DETECTED Final   Klebsiella aerogenes NOT DETECTED NOT DETECTED Final   Klebsiella oxytoca NOT DETECTED NOT DETECTED Final   Klebsiella pneumoniae NOT DETECTED NOT DETECTED Final   Proteus species NOT DETECTED NOT DETECTED Final   Salmonella species NOT DETECTED NOT DETECTED Final   Serratia marcescens NOT DETECTED NOT DETECTED Final   Haemophilus influenzae NOT DETECTED NOT DETECTED Final   Neisseria meningitidis NOT DETECTED NOT DETECTED Final   Pseudomonas aeruginosa NOT DETECTED NOT DETECTED Final   Stenotrophomonas maltophilia NOT DETECTED NOT DETECTED Final   Candida albicans NOT DETECTED NOT DETECTED Final   Candida auris NOT DETECTED NOT DETECTED Final    Candida glabrata NOT DETECTED NOT DETECTED Final   Candida krusei NOT DETECTED NOT DETECTED Final   Candida parapsilosis NOT DETECTED NOT DETECTED Final   Candida tropicalis NOT DETECTED NOT DETECTED Final   Cryptococcus neoformans/gattii NOT DETECTED NOT DETECTED Final    Comment: Performed at [facility], [address] [place], [place] [address]  C Difficile Quick Screen w PCR reflex     Status: None   Collection Time: 09/03/23 11:42 AM   Specimen: STOOL  Result Value Ref Range Status   C Diff antigen NEGATIVE NEGATIVE Final   C Diff toxin NEGATIVE NEGATIVE Final   C Diff interpretation No C. difficile detected.  Final    Comment: Performed at [facility], [address], [place], [place] [address]  Culture, blood (Routine X 2) w Reflex to ID Panel     Status: None (Preliminary result)   Collection Time: 09/04/23  6:46 AM   Specimen: BLOOD  Result Value Ref Range Status   Specimen Description BLOOD BLOOD RIGHT ARM  Final   Special Requests   Final    BOTTLES DRAWN AEROBIC AND ANAEROBIC Blood Culture adequate volume   Culture   Final    NO GROWTH 2 DAYS Performed at [facility], [address] [place], [place] [address]    Report Status PENDING  Incomplete  Culture, blood (Routine X 2) w Reflex to ID Panel     Status: None (Preliminary result)   Collection Time: 09/04/23  6:47 AM   Specimen: BLOOD  Result Value Ref Range Status   Specimen Description BLOOD BLOOD LEFT HAND  Final   Special Requests   Final    BOTTLES DRAWN AEROBIC AND ANAEROBIC Blood Culture adequate volume   Culture   Final    NO GROWTH 2 DAYS Performed at [facility], [address], [place], [place] [address]    Report Status PENDING  Incomplete     Labs: BNP (last 3 results) No results for input(s): "BNP" in the last 8760 hours. Basic Metabolic Panel: Recent Labs  Lab 09/02/23 1614 09/03/23 0334 09/04/23 0646 09/05/23 0557  NA 137 137 136 134*  K 4.1 3.6 3.6 4.1   CL 104 108 102 103  CO2 21* 20* 22 24  GLUCOSE 128*  139* 120* 96  BUN 32* 30* 25* 27*  CREATININE 1.83* 1.68* 1.38* 1.26*  CALCIUM 8.9 8.3* 8.7* 8.4*   Liver Function Tests: Recent Labs  Lab 09/02/23 1614  AST 45*  ALT 28  ALKPHOS 70  BILITOT 1.5*  PROT 6.3*  ALBUMIN 3.2*   No results for input(s): "LIPASE", "AMYLASE" in the last 168 hours. No results for input(s): "AMMONIA" in the last 168 hours. CBC: Recent Labs  Lab 09/02/23 1614 09/03/23 0334 09/04/23 0646 09/05/23 0557 09/06/23 0230  WBC 18.0* 12.1* 8.7 5.7 5.6  NEUTROABS 15.9*  --   --   --   --   HGB 13.5 11.2* 11.7* 10.2* 10.4*  HCT 39.7 34.2* 33.7* 30.2* 30.6*  MCV 95.0 97.7 92.3 92.9 94.2  PLT 119* 95* 85* 80* 94*   Cardiac Enzymes: No results for input(s): "CKTOTAL", "CKMB", "CKMBINDEX", "TROPONINI" in the last 168 hours. BNP: Invalid input(s): "POCBNP" CBG: No results for input(s): "GLUCAP" in the last 168 hours. D-Dimer No results for input(s): "DDIMER" in the last 72 hours. Hgb A1c No results for input(s): "HGBA1C" in the last 72 hours. Lipid Profile No results for input(s): "CHOL", "HDL", "LDLCALC", "TRIG", "CHOLHDL", "LDLDIRECT" in the last 72 hours. Thyroid function studies Recent Labs    09/05/23 0557  TSH 20.797*   Anemia work up No results for input(s): "VITAMINB12", "FOLATE", "FERRITIN", "TIBC", "IRON", "RETICCTPCT" in the last 72 hours. Urinalysis    Component Value Date/Time   COLORURINE AMBER (A) 09/02/2023 1614   APPEARANCEUR CLOUDY (A) 09/02/2023 1614   LABSPEC 1.025 09/02/2023 1614   PHURINE 7.0 09/02/2023 1614   GLUCOSEU NEGATIVE 09/02/2023 1614   HGBUR NEGATIVE 09/02/2023 1614   BILIRUBINUR NEGATIVE 09/02/2023 1614   KETONESUR NEGATIVE 09/02/2023 1614   PROTEINUR 100 (A) 09/02/2023 1614   UROBILINOGEN 0.2 04/02/2009 1155   NITRITE NEGATIVE 09/02/2023 1614   LEUKOCYTESUR MODERATE (A) 09/02/2023 1614   Sepsis Labs Recent Labs  Lab 09/03/23 0334 09/04/23 0646  09/05/23 0557 09/06/23 0230  WBC 12.1* 8.7 5.7 5.6   Microbiology Recent Results (from the past 240 hours)  Resp panel by RT-PCR (RSV, Flu A&B, Covid) Anterior Nasal Swab     Status: None   Collection Time: 09/02/23  4:14 PM   Specimen: Anterior Nasal Swab  Result Value Ref Range Status   SARS Coronavirus 2 by RT PCR NEGATIVE NEGATIVE Final   Influenza A by PCR NEGATIVE NEGATIVE Final   Influenza B by PCR NEGATIVE NEGATIVE Final    Comment: (NOTE) The Xpert Xpress SARS-CoV-2/FLU/RSV plus assay is intended as an aid in the diagnosis of influenza from Nasopharyngeal swab specimens and should not be used as a sole basis for treatment. Nasal washings and aspirates are unacceptable for Xpert Xpress SARS-CoV-2/FLU/RSV testing.  Fact Sheet for Patients: BloggerCourse.com  Fact Sheet for Healthcare Providers: SeriousBroker.it  This test is not yet approved or cleared by the [place] FDA and has been authorized for detection and/or diagnosis of SARS-CoV-2 by FDA under an Emergency Use Authorization (EUA). This EUA will remain in effect (meaning this test can be used) for the duration of the COVID-19 declaration under Section 564(b)(1) of the Act, 21 U.S.C. section 360bbb-3(b)(1), unless the authorization is terminated or revoked.     Resp Syncytial Virus by PCR NEGATIVE NEGATIVE Final    Comment: (NOTE) Fact Sheet for Patients: BloggerCourse.com  Fact Sheet for Healthcare Providers: SeriousBroker.it  This test is not yet approved or cleared by the [place] FDA and has been  authorized for detection and/or diagnosis of SARS-CoV-2 by FDA under an Emergency Use Authorization (EUA). This EUA will remain in effect (meaning this test can be used) for the duration of the COVID-19 declaration under Section 564(b)(1) of the Act, 21 U.S.C. section 360bbb-3(b)(1), unless the  authorization is terminated or revoked.  Performed at [facility], [address], [place], [place] [address]   Blood Culture (routine x 2)     Status: Abnormal   Collection Time: 09/02/23  4:14 PM   Specimen: BLOOD  Result Value Ref Range Status   Specimen Description BLOOD SITE NOT SPECIFIED  Final   Special Requests   Final    BOTTLES DRAWN AEROBIC AND ANAEROBIC Blood Culture results may not be optimal due to an inadequate volume of blood received in culture bottles   Culture  Setup Time   Final    GRAM POSITIVE COCCI IN PAIRS IN CHAINS IN BOTH AEROBIC AND ANAEROBIC BOTTLES CRITICAL VALUE NOTED.  VALUE IS CONSISTENT WITH PREVIOUSLY REPORTED AND CALLED VALUE.    Culture (A)  Final    GROUP A STREP (S.PYOGENES) ISOLATED HEALTH DEPARTMENT NOTIFIED SUSCEPTIBILITIES PERFORMED ON PREVIOUS CULTURE WITHIN THE LAST 5 DAYS. Performed at [facility], [address], [place], [place] [address]    Report Status 09/05/2023 FINAL  Final  Urine Culture     Status: Abnormal (Preliminary result)   Collection Time: 09/02/23  4:14 PM   Specimen: Urine, Random  Result Value Ref Range Status   Specimen Description URINE, RANDOM  Final   Special Requests NONE Reflexed from [id number]  Final   Culture (A)  Final    >=100,000 COLONIES/mL PROTEUS MIRABILIS >=100,000 COLONIES/mL ENTEROCOCCUS FAECALIS SUSCEPTIBILITIES TO FOLLOW Performed at [facility], [address] [place], [place] [address]    Report Status PENDING  Incomplete   Organism ID, Bacteria ENTEROCOCCUS FAECALIS (A)  Final      Susceptibility   Enterococcus faecalis - MIC*    AMPICILLIN <=2 SENSITIVE Sensitive     NITROFURANTOIN <=16 SENSITIVE Sensitive     VANCOMYCIN 1 SENSITIVE Sensitive     * >=100,000 COLONIES/mL ENTEROCOCCUS FAECALIS  Blood Culture (routine x 2)     Status: Abnormal   Collection Time: 09/02/23  4:36 PM   Specimen: BLOOD LEFT ARM  Result Value Ref Range Status   Specimen Description BLOOD  LEFT ARM  Final   Special Requests   Final    BOTTLES DRAWN AEROBIC AND ANAEROBIC Blood Culture results may not be optimal due to an inadequate volume of blood received in culture bottles   Culture  Setup Time   Final    GRAM POSITIVE COCCI IN PAIRS IN CHAINS IN BOTH AEROBIC AND ANAEROBIC BOTTLES CRITICAL RESULT CALLED TO, READ BACK BY AND VERIFIED WITH: PHARMD J LEDFORD 09/03/2023 @ 0604 BY AB    Culture (A)  Final    GROUP A STREP (S.PYOGENES) ISOLATED HEALTH DEPARTMENT NOTIFIED Performed at [facility], [address], [place], [place] [address]    Report Status 09/05/2023 FINAL  Final   Organism ID, Bacteria GROUP A STREP (S.PYOGENES) ISOLATED  Final      Susceptibility   Group a strep (s.pyogenes) isolated - MIC*    PENICILLIN <=0.06 SENSITIVE Sensitive     CEFTRIAXONE <=0.12 SENSITIVE Sensitive     ERYTHROMYCIN <=0.12 SENSITIVE Sensitive     LEVOFLOXACIN 1 SENSITIVE Sensitive     VANCOMYCIN 0.5 SENSITIVE Sensitive     *  GROUP A STREP (S.PYOGENES) ISOLATED  Blood Culture ID Panel (Reflexed)     Status: Abnormal   Collection Time: 09/02/23  4:36 PM  Result Value Ref Range Status   Enterococcus faecalis NOT DETECTED NOT DETECTED Final   Enterococcus Faecium NOT DETECTED NOT DETECTED Final   Listeria monocytogenes NOT DETECTED NOT DETECTED Final   Staphylococcus species NOT DETECTED NOT DETECTED Final   Staphylococcus aureus (BCID) NOT DETECTED NOT DETECTED Final   Staphylococcus epidermidis NOT DETECTED NOT DETECTED Final   Staphylococcus lugdunensis NOT DETECTED NOT DETECTED Final   Streptococcus species DETECTED (A) NOT DETECTED Final    Comment: CRITICAL RESULT CALLED TO, READ BACK BY AND VERIFIED WITH: PHARMD J LEDFORD 09/03/2023 @ 0604 BY AB    Streptococcus agalactiae NOT DETECTED NOT DETECTED Final   Streptococcus pneumoniae NOT DETECTED NOT DETECTED Final   Streptococcus pyogenes DETECTED (A) NOT DETECTED Final    Comment: CRITICAL RESULT CALLED TO, READ BACK BY  AND VERIFIED WITH: PHARMD J LEDFORD 09/03/2023 @ 0604 BY AB    A.calcoaceticus-baumannii NOT DETECTED NOT DETECTED Final   Bacteroides fragilis NOT DETECTED NOT DETECTED Final   Enterobacterales NOT DETECTED NOT DETECTED Final   Enterobacter cloacae complex NOT DETECTED NOT DETECTED Final   Escherichia coli NOT DETECTED NOT DETECTED Final   Klebsiella aerogenes NOT DETECTED NOT DETECTED Final   Klebsiella oxytoca NOT DETECTED NOT DETECTED Final   Klebsiella pneumoniae NOT DETECTED NOT DETECTED Final   Proteus species NOT DETECTED NOT DETECTED Final   Salmonella species NOT DETECTED NOT DETECTED Final   Serratia marcescens NOT DETECTED NOT DETECTED Final   Haemophilus influenzae NOT DETECTED NOT DETECTED Final   Neisseria meningitidis NOT DETECTED NOT DETECTED Final   Pseudomonas aeruginosa NOT DETECTED NOT DETECTED Final   Stenotrophomonas maltophilia NOT DETECTED NOT DETECTED Final   Candida albicans NOT DETECTED NOT DETECTED Final   Candida auris NOT DETECTED NOT DETECTED Final   Candida glabrata NOT DETECTED NOT DETECTED Final   Candida krusei NOT DETECTED NOT DETECTED Final   Candida parapsilosis NOT DETECTED NOT DETECTED Final   Candida tropicalis NOT DETECTED NOT DETECTED Final   Cryptococcus neoformans/gattii NOT DETECTED NOT DETECTED Final    Comment: Performed at [facility], [address] [place], [place] [address]  C Difficile Quick Screen w PCR reflex     Status: None   Collection Time: 09/03/23 11:42 AM   Specimen: STOOL  Result Value Ref Range Status   C Diff antigen NEGATIVE NEGATIVE Final   C Diff toxin NEGATIVE NEGATIVE Final   C Diff interpretation No C. difficile detected.  Final    Comment: Performed at [facility], [address] [place], [place] [address]  Culture, blood (Routine X 2) w Reflex to ID Panel     Status: None (Preliminary result)   Collection Time: 09/04/23  6:46 AM   Specimen: BLOOD  Result Value Ref Range Status   Specimen  Description BLOOD BLOOD RIGHT ARM  Final   Special Requests   Final    BOTTLES DRAWN AEROBIC AND ANAEROBIC Blood Culture adequate volume   Culture   Final    NO GROWTH 2 DAYS Performed at [facility], [address], [place], [place] [address]    Report Status PENDING  Incomplete  Culture, blood (Routine X 2) w Reflex to ID Panel     Status: None (Preliminary result)   Collection Time: 09/04/23  6:47 AM   Specimen: BLOOD  Result Value Ref Range Status   Specimen Description BLOOD BLOOD LEFT HAND  Final   Special Requests   Final    BOTTLES DRAWN AEROBIC AND ANAEROBIC Blood Culture adequate volume   Culture   Final    NO GROWTH 2 DAYS Performed at [facility], [address] [place], [place] [address]    Report Status PENDING  Incomplete     Time coordinating discharge: Over 35 minutes  SIGNED:   Marinda Elk, MD  [facility] 09/06/2023, 9:10 AM Pager   If 7PM-7AM, please contact night-coverage [email or web address] Password [email or web address]

## 2023-09-06 NOTE — Progress Notes (Signed)
 Physical Therapy Treatment Patient Details Name: Timothy Spencer MRN: [id number] DOB: 1940-09-07 Today's Date: 09/06/2023   History of Present Illness The pt is an 83 yo male presenting 4/6 with darker urine, admitted for sepsis and UTI. PMH includes: chronic suprapubic catheter, OSA, COPD not on O2, HTN, hemochromatosis, restless leg syndrome, peripheral neuropathy, and CAD.    PT Comments  Pt restless upon arrival to room, states he cannot work with PT initially because he can't find his shoes that he left in the ED and his catheter bag is "too big". Pt remains fixated on changing out his catheter bag throughout session, RN notified. Pt demonstrating improved activity tolerance this date, ambulating 60 ft with RW with max cues for form/safety that pt has difficulty following. Pt states he is perfectly capable of d/c home with support of wife. However, wife called at end of session stating "I can't take care of him, I am on oxygen". She also expressed her desire was for pt to d/c to [facility] but pt declined this. Care team notified of this. PT to continue to follow.      If plan is discharge home, recommend the following: A little help with walking and/or transfers;A little help with bathing/dressing/bathroom;Assist for transportation;Help with stairs or ramp for entrance;Supervision due to cognitive status   Can travel by private vehicle        Equipment Recommendations  Wheelchair (measurements PT);Wheelchair cushion (measurements PT) (limited tolerance)    Recommendations for Other Services       Precautions / Restrictions Precautions Precautions: Fall Recall of Precautions/Restrictions: Intact Restrictions Weight Bearing Restrictions Per Provider Order: No     Mobility  Bed Mobility Overal bed mobility: Needs Assistance Bed Mobility: Supine to Sit, Sit to Supine     Supine to sit: Supervision Sit to supine: Supervision   General bed mobility comments: for safety,  cues for sequencing for scooting to EOB. Increased time and multiple stop/starts    Transfers Overall transfer level: Needs assistance Equipment used: Rolling walker (2 wheels) Transfers: Sit to/from Stand Sit to Stand: Supervision           General transfer comment: for safety, slow to rise and steady    Ambulation/Gait Ambulation/Gait assistance: Contact guard assist Gait Distance (Feet): 60 Feet Assistive device: Rolling walker (2 wheels) Gait Pattern/deviations: Decreased stride length, Shuffle, Trunk flexed, Step-through pattern Gait velocity: decr     General Gait Details: close guard for safety, cues for upright posture, placement in RW, proximity of RW to self. Pt declining cues at times stating "I can't fix that today"   Stairs             Wheelchair Mobility     Tilt Bed    Modified Rankin (Stroke Patients Only)       Balance Overall balance assessment: Needs assistance Sitting-balance support: No upper extremity supported, Feet supported Sitting balance-Leahy Scale: Fair     Standing balance support: Bilateral upper extremity supported, During functional activity, Reliant on assistive device for balance Standing balance-Leahy Scale: Poor Standing balance comment: reliant on RW                            Communication Communication Communication: No apparent difficulties  Cognition Arousal: Alert Behavior During Therapy: Restless   PT - Cognitive impairments: No family/caregiver present to determine baseline, Attention, Sequencing, Problem solving, Safety/Judgement  PT - Cognition Comments: pt oriented to self and situation, is very distractible and perseverates on missing his shoes, wanting a smaller catheter. Pt requires sequencing and safety cues throughout mobility, seems to lack insight into deficits. Following commands: Intact      Cueing Cueing Techniques: Verbal cues  Exercises       General Comments General comments (skin integrity, edema, etc.): DOE 2/4, settles with rest      Pertinent Vitals/Pain Pain Assessment Pain Assessment: No/denies pain    Home Living                          Prior Function            PT Goals (current goals can now be found in the care plan section) Acute Rehab PT Goals Patient Stated Goal: to return home when moving better PT Goal Formulation: With patient Time For Goal Achievement: 09/18/23 Potential to Achieve Goals: Good Progress towards PT goals: Progressing toward goals    Frequency    Min 2X/week      PT Plan      Co-evaluation              AM-PAC PT "6 Clicks" Mobility   Outcome Measure  Help needed turning from your back to your side while in a flat bed without using bedrails?: A Little Help needed moving from lying on your back to sitting on the side of a flat bed without using bedrails?: A Little Help needed moving to and from a bed to a chair (including a wheelchair)?: A Little Help needed standing up from a chair using your arms (e.g., wheelchair or bedside chair)?: A Little Help needed to walk in hospital room?: A Little Help needed climbing 3-5 steps with a railing? : A Lot 6 Click Score: 17    End of Session Equipment Utilized During Treatment: Gait belt Activity Tolerance: Patient tolerated treatment well;Patient limited by fatigue Patient left: with call bell/phone within reach;in bed;with bed alarm set Nurse Communication: Mobility status PT Visit Diagnosis: Unsteadiness on feet (R26.81);Other abnormalities of gait and mobility (R26.89);Muscle weakness (generalized) (M62.81)     Time: 4742-5956 PT Time Calculation (min) (ACUTE ONLY): 26 min  Charges:    $Gait Training: 8-22 mins $Therapeutic Activity: 8-22 mins PT General Charges $$ ACUTE PT VISIT: 1 Visit                     Marye Round, PT DPT Acute Rehabilitation Services Secure Chat Preferred  Office  [phone]    Taleya Whitcher E Christain [place] 09/06/2023, 11:23 AM

## 2023-09-06 NOTE — Progress Notes (Signed)
    [facility]  Date of Admission:  09/02/2023     Reason for Follow Up: Sepsis [facility])         BRIEF PROGRESS NOTE:  Timothy Spencer blood cultures remain without growth in the setting of Streptococcus pyogenes bacteremia. Will change antibiotics to amoxicillin 1 g PO tid for a total of 14 days following clearance of blood cultures on 09/04/23. Ok for discharge from ID standpoint.  Marcos Eke, NP [facility]  09/06/2023  9:27 AM

## 2023-09-06 NOTE — Plan of Care (Signed)

## 2023-09-06 NOTE — Progress Notes (Signed)
 Discharge packet (AVS) provided to pt with instructions. Pt verbalized understanding of instructions. NO complaints. Pt d/c to  home with [facility] services, TOC on board, Pt takes uber ride, he remains alert/oriented in no apparent distress.

## 2023-09-09 LAB — CULTURE, BLOOD (ROUTINE X 2)
Culture: NO GROWTH
Culture: NO GROWTH
Special Requests: ADEQUATE
Special Requests: ADEQUATE

## 2023-09-10 DIAGNOSIS — R071 Chest pain on breathing: Secondary | ICD-10-CM | POA: Diagnosis not present

## 2023-09-10 DIAGNOSIS — E039 Hypothyroidism, unspecified: Secondary | ICD-10-CM | POA: Diagnosis not present

## 2023-09-10 DIAGNOSIS — R0781 Pleurodynia: Secondary | ICD-10-CM | POA: Diagnosis not present

## 2023-09-11 DIAGNOSIS — I129 Hypertensive chronic kidney disease with stage 1 through stage 4 chronic kidney disease, or unspecified chronic kidney disease: Secondary | ICD-10-CM | POA: Diagnosis not present

## 2023-09-11 DIAGNOSIS — E785 Hyperlipidemia, unspecified: Secondary | ICD-10-CM | POA: Diagnosis not present

## 2023-09-11 DIAGNOSIS — N401 Enlarged prostate with lower urinary tract symptoms: Secondary | ICD-10-CM | POA: Diagnosis not present

## 2023-09-11 DIAGNOSIS — H3530 Unspecified macular degeneration: Secondary | ICD-10-CM | POA: Diagnosis not present

## 2023-09-11 DIAGNOSIS — N390 Urinary tract infection, site not specified: Secondary | ICD-10-CM | POA: Diagnosis not present

## 2023-09-11 DIAGNOSIS — M109 Gout, unspecified: Secondary | ICD-10-CM | POA: Diagnosis not present

## 2023-09-11 DIAGNOSIS — F32A Depression, unspecified: Secondary | ICD-10-CM | POA: Diagnosis not present

## 2023-09-11 DIAGNOSIS — T83510A Infection and inflammatory reaction due to cystostomy catheter, initial encounter: Secondary | ICD-10-CM | POA: Diagnosis not present

## 2023-09-11 DIAGNOSIS — A409 Streptococcal sepsis, unspecified: Secondary | ICD-10-CM | POA: Diagnosis not present

## 2023-09-11 DIAGNOSIS — I959 Hypotension, unspecified: Secondary | ICD-10-CM | POA: Diagnosis not present

## 2023-09-11 DIAGNOSIS — I4510 Unspecified right bundle-branch block: Secondary | ICD-10-CM | POA: Diagnosis not present

## 2023-09-11 DIAGNOSIS — N1831 Chronic kidney disease, stage 3a: Secondary | ICD-10-CM | POA: Diagnosis not present

## 2023-09-11 DIAGNOSIS — E43 Unspecified severe protein-calorie malnutrition: Secondary | ICD-10-CM | POA: Diagnosis not present

## 2023-09-11 DIAGNOSIS — I2510 Atherosclerotic heart disease of native coronary artery without angina pectoris: Secondary | ICD-10-CM | POA: Diagnosis not present

## 2023-09-11 DIAGNOSIS — Z466 Encounter for fitting and adjustment of urinary device: Secondary | ICD-10-CM | POA: Diagnosis not present

## 2023-09-11 DIAGNOSIS — R338 Other retention of urine: Secondary | ICD-10-CM | POA: Diagnosis not present

## 2023-09-11 DIAGNOSIS — E039 Hypothyroidism, unspecified: Secondary | ICD-10-CM | POA: Diagnosis not present

## 2023-09-11 DIAGNOSIS — G9341 Metabolic encephalopathy: Secondary | ICD-10-CM | POA: Diagnosis not present

## 2023-09-11 DIAGNOSIS — D696 Thrombocytopenia, unspecified: Secondary | ICD-10-CM | POA: Diagnosis not present

## 2023-09-11 DIAGNOSIS — J439 Emphysema, unspecified: Secondary | ICD-10-CM | POA: Diagnosis not present

## 2023-09-11 DIAGNOSIS — G2581 Restless legs syndrome: Secondary | ICD-10-CM | POA: Diagnosis not present

## 2023-09-11 DIAGNOSIS — G629 Polyneuropathy, unspecified: Secondary | ICD-10-CM | POA: Diagnosis not present

## 2023-09-11 DIAGNOSIS — I440 Atrioventricular block, first degree: Secondary | ICD-10-CM | POA: Diagnosis not present

## 2023-09-11 DIAGNOSIS — D631 Anemia in chronic kidney disease: Secondary | ICD-10-CM | POA: Diagnosis not present

## 2023-09-11 DIAGNOSIS — F419 Anxiety disorder, unspecified: Secondary | ICD-10-CM | POA: Diagnosis not present

## 2023-09-13 DIAGNOSIS — R5381 Other malaise: Secondary | ICD-10-CM | POA: Diagnosis not present

## 2023-09-13 DIAGNOSIS — J432 Centrilobular emphysema: Secondary | ICD-10-CM | POA: Diagnosis not present

## 2023-09-13 DIAGNOSIS — N1831 Chronic kidney disease, stage 3a: Secondary | ICD-10-CM | POA: Diagnosis not present

## 2023-09-13 DIAGNOSIS — E039 Hypothyroidism, unspecified: Secondary | ICD-10-CM | POA: Diagnosis not present

## 2023-09-16 DIAGNOSIS — D696 Thrombocytopenia, unspecified: Secondary | ICD-10-CM | POA: Diagnosis not present

## 2023-09-16 DIAGNOSIS — I129 Hypertensive chronic kidney disease with stage 1 through stage 4 chronic kidney disease, or unspecified chronic kidney disease: Secondary | ICD-10-CM | POA: Diagnosis not present

## 2023-09-16 DIAGNOSIS — N401 Enlarged prostate with lower urinary tract symptoms: Secondary | ICD-10-CM | POA: Diagnosis not present

## 2023-09-16 DIAGNOSIS — E039 Hypothyroidism, unspecified: Secondary | ICD-10-CM | POA: Diagnosis not present

## 2023-09-16 DIAGNOSIS — N390 Urinary tract infection, site not specified: Secondary | ICD-10-CM | POA: Diagnosis not present

## 2023-09-16 DIAGNOSIS — A409 Streptococcal sepsis, unspecified: Secondary | ICD-10-CM | POA: Diagnosis not present

## 2023-09-16 DIAGNOSIS — E785 Hyperlipidemia, unspecified: Secondary | ICD-10-CM | POA: Diagnosis not present

## 2023-09-16 DIAGNOSIS — Z466 Encounter for fitting and adjustment of urinary device: Secondary | ICD-10-CM | POA: Diagnosis not present

## 2023-09-16 DIAGNOSIS — E43 Unspecified severe protein-calorie malnutrition: Secondary | ICD-10-CM | POA: Diagnosis not present

## 2023-09-16 DIAGNOSIS — F32A Depression, unspecified: Secondary | ICD-10-CM | POA: Diagnosis not present

## 2023-09-16 DIAGNOSIS — R338 Other retention of urine: Secondary | ICD-10-CM | POA: Diagnosis not present

## 2023-09-16 DIAGNOSIS — T83510A Infection and inflammatory reaction due to cystostomy catheter, initial encounter: Secondary | ICD-10-CM | POA: Diagnosis not present

## 2023-09-16 DIAGNOSIS — N1831 Chronic kidney disease, stage 3a: Secondary | ICD-10-CM | POA: Diagnosis not present

## 2023-09-16 DIAGNOSIS — G2581 Restless legs syndrome: Secondary | ICD-10-CM | POA: Diagnosis not present

## 2023-09-16 DIAGNOSIS — J439 Emphysema, unspecified: Secondary | ICD-10-CM | POA: Diagnosis not present

## 2023-09-16 DIAGNOSIS — D631 Anemia in chronic kidney disease: Secondary | ICD-10-CM | POA: Diagnosis not present

## 2023-09-18 DIAGNOSIS — N1831 Chronic kidney disease, stage 3a: Secondary | ICD-10-CM | POA: Diagnosis not present

## 2023-09-18 DIAGNOSIS — J439 Emphysema, unspecified: Secondary | ICD-10-CM | POA: Diagnosis not present

## 2023-09-18 DIAGNOSIS — G2581 Restless legs syndrome: Secondary | ICD-10-CM | POA: Diagnosis not present

## 2023-09-18 DIAGNOSIS — T83510A Infection and inflammatory reaction due to cystostomy catheter, initial encounter: Secondary | ICD-10-CM | POA: Diagnosis not present

## 2023-09-18 DIAGNOSIS — Z466 Encounter for fitting and adjustment of urinary device: Secondary | ICD-10-CM | POA: Diagnosis not present

## 2023-09-18 DIAGNOSIS — E43 Unspecified severe protein-calorie malnutrition: Secondary | ICD-10-CM | POA: Diagnosis not present

## 2023-09-18 DIAGNOSIS — D631 Anemia in chronic kidney disease: Secondary | ICD-10-CM | POA: Diagnosis not present

## 2023-09-18 DIAGNOSIS — N401 Enlarged prostate with lower urinary tract symptoms: Secondary | ICD-10-CM | POA: Diagnosis not present

## 2023-09-18 DIAGNOSIS — A409 Streptococcal sepsis, unspecified: Secondary | ICD-10-CM | POA: Diagnosis not present

## 2023-09-18 DIAGNOSIS — E785 Hyperlipidemia, unspecified: Secondary | ICD-10-CM | POA: Diagnosis not present

## 2023-09-18 DIAGNOSIS — D696 Thrombocytopenia, unspecified: Secondary | ICD-10-CM | POA: Diagnosis not present

## 2023-09-18 DIAGNOSIS — E039 Hypothyroidism, unspecified: Secondary | ICD-10-CM | POA: Diagnosis not present

## 2023-09-18 DIAGNOSIS — I129 Hypertensive chronic kidney disease with stage 1 through stage 4 chronic kidney disease, or unspecified chronic kidney disease: Secondary | ICD-10-CM | POA: Diagnosis not present

## 2023-09-18 DIAGNOSIS — F32A Depression, unspecified: Secondary | ICD-10-CM | POA: Diagnosis not present

## 2023-09-18 DIAGNOSIS — R338 Other retention of urine: Secondary | ICD-10-CM | POA: Diagnosis not present

## 2023-09-18 DIAGNOSIS — N390 Urinary tract infection, site not specified: Secondary | ICD-10-CM | POA: Diagnosis not present

## 2023-09-24 DIAGNOSIS — N400 Enlarged prostate without lower urinary tract symptoms: Secondary | ICD-10-CM | POA: Diagnosis not present

## 2023-09-24 DIAGNOSIS — G8929 Other chronic pain: Secondary | ICD-10-CM | POA: Diagnosis not present

## 2023-09-24 DIAGNOSIS — N1831 Chronic kidney disease, stage 3a: Secondary | ICD-10-CM | POA: Diagnosis not present

## 2023-09-24 DIAGNOSIS — G629 Polyneuropathy, unspecified: Secondary | ICD-10-CM | POA: Diagnosis not present

## 2023-09-24 DIAGNOSIS — K219 Gastro-esophageal reflux disease without esophagitis: Secondary | ICD-10-CM | POA: Diagnosis not present

## 2023-09-24 DIAGNOSIS — G4733 Obstructive sleep apnea (adult) (pediatric): Secondary | ICD-10-CM | POA: Diagnosis not present

## 2023-09-24 DIAGNOSIS — G2581 Restless legs syndrome: Secondary | ICD-10-CM | POA: Diagnosis not present

## 2023-09-24 DIAGNOSIS — J432 Centrilobular emphysema: Secondary | ICD-10-CM | POA: Diagnosis not present

## 2023-09-24 DIAGNOSIS — I2510 Atherosclerotic heart disease of native coronary artery without angina pectoris: Secondary | ICD-10-CM | POA: Diagnosis not present

## 2023-09-24 DIAGNOSIS — M545 Low back pain, unspecified: Secondary | ICD-10-CM | POA: Diagnosis not present

## 2023-09-24 DIAGNOSIS — F419 Anxiety disorder, unspecified: Secondary | ICD-10-CM | POA: Diagnosis not present

## 2023-09-24 DIAGNOSIS — I129 Hypertensive chronic kidney disease with stage 1 through stage 4 chronic kidney disease, or unspecified chronic kidney disease: Secondary | ICD-10-CM | POA: Diagnosis not present

## 2023-09-24 DIAGNOSIS — R1310 Dysphagia, unspecified: Secondary | ICD-10-CM | POA: Diagnosis not present

## 2023-09-24 DIAGNOSIS — F32A Depression, unspecified: Secondary | ICD-10-CM | POA: Diagnosis not present

## 2023-09-24 DIAGNOSIS — M1030 Gout due to renal impairment, unspecified site: Secondary | ICD-10-CM | POA: Diagnosis not present

## 2023-09-24 DIAGNOSIS — N529 Male erectile dysfunction, unspecified: Secondary | ICD-10-CM | POA: Diagnosis not present

## 2023-09-24 DIAGNOSIS — E7800 Pure hypercholesterolemia, unspecified: Secondary | ICD-10-CM | POA: Diagnosis not present

## 2023-09-24 DIAGNOSIS — A409 Streptococcal sepsis, unspecified: Secondary | ICD-10-CM | POA: Diagnosis not present

## 2023-09-24 DIAGNOSIS — E039 Hypothyroidism, unspecified: Secondary | ICD-10-CM | POA: Diagnosis not present

## 2023-09-24 DIAGNOSIS — I440 Atrioventricular block, first degree: Secondary | ICD-10-CM | POA: Diagnosis not present

## 2023-09-24 DIAGNOSIS — I452 Bifascicular block: Secondary | ICD-10-CM | POA: Diagnosis not present

## 2023-09-24 DIAGNOSIS — M810 Age-related osteoporosis without current pathological fracture: Secondary | ICD-10-CM | POA: Diagnosis not present

## 2023-09-24 DIAGNOSIS — H409 Unspecified glaucoma: Secondary | ICD-10-CM | POA: Diagnosis not present

## 2023-09-24 DIAGNOSIS — I38 Endocarditis, valve unspecified: Secondary | ICD-10-CM | POA: Diagnosis not present

## 2023-09-24 DIAGNOSIS — N390 Urinary tract infection, site not specified: Secondary | ICD-10-CM | POA: Diagnosis not present

## 2023-10-02 DIAGNOSIS — R260 Ataxic gait: Secondary | ICD-10-CM | POA: Diagnosis not present

## 2023-10-02 DIAGNOSIS — M545 Low back pain, unspecified: Secondary | ICD-10-CM | POA: Diagnosis not present

## 2023-10-05 DIAGNOSIS — G2581 Restless legs syndrome: Secondary | ICD-10-CM | POA: Diagnosis not present

## 2023-10-05 DIAGNOSIS — G4733 Obstructive sleep apnea (adult) (pediatric): Secondary | ICD-10-CM | POA: Diagnosis not present

## 2023-10-05 DIAGNOSIS — M810 Age-related osteoporosis without current pathological fracture: Secondary | ICD-10-CM | POA: Diagnosis not present

## 2023-10-05 DIAGNOSIS — I440 Atrioventricular block, first degree: Secondary | ICD-10-CM | POA: Diagnosis not present

## 2023-10-05 DIAGNOSIS — J432 Centrilobular emphysema: Secondary | ICD-10-CM | POA: Diagnosis not present

## 2023-10-05 DIAGNOSIS — I38 Endocarditis, valve unspecified: Secondary | ICD-10-CM | POA: Diagnosis not present

## 2023-10-05 DIAGNOSIS — N1831 Chronic kidney disease, stage 3a: Secondary | ICD-10-CM | POA: Diagnosis not present

## 2023-10-05 DIAGNOSIS — N529 Male erectile dysfunction, unspecified: Secondary | ICD-10-CM | POA: Diagnosis not present

## 2023-10-05 DIAGNOSIS — N390 Urinary tract infection, site not specified: Secondary | ICD-10-CM | POA: Diagnosis not present

## 2023-10-05 DIAGNOSIS — M545 Low back pain, unspecified: Secondary | ICD-10-CM | POA: Diagnosis not present

## 2023-10-05 DIAGNOSIS — R1310 Dysphagia, unspecified: Secondary | ICD-10-CM | POA: Diagnosis not present

## 2023-10-05 DIAGNOSIS — M1030 Gout due to renal impairment, unspecified site: Secondary | ICD-10-CM | POA: Diagnosis not present

## 2023-10-05 DIAGNOSIS — G629 Polyneuropathy, unspecified: Secondary | ICD-10-CM | POA: Diagnosis not present

## 2023-10-05 DIAGNOSIS — F32A Depression, unspecified: Secondary | ICD-10-CM | POA: Diagnosis not present

## 2023-10-05 DIAGNOSIS — E7800 Pure hypercholesterolemia, unspecified: Secondary | ICD-10-CM | POA: Diagnosis not present

## 2023-10-05 DIAGNOSIS — F419 Anxiety disorder, unspecified: Secondary | ICD-10-CM | POA: Diagnosis not present

## 2023-10-05 DIAGNOSIS — G8929 Other chronic pain: Secondary | ICD-10-CM | POA: Diagnosis not present

## 2023-10-05 DIAGNOSIS — I2510 Atherosclerotic heart disease of native coronary artery without angina pectoris: Secondary | ICD-10-CM | POA: Diagnosis not present

## 2023-10-05 DIAGNOSIS — I129 Hypertensive chronic kidney disease with stage 1 through stage 4 chronic kidney disease, or unspecified chronic kidney disease: Secondary | ICD-10-CM | POA: Diagnosis not present

## 2023-10-05 DIAGNOSIS — A409 Streptococcal sepsis, unspecified: Secondary | ICD-10-CM | POA: Diagnosis not present

## 2023-10-05 DIAGNOSIS — I452 Bifascicular block: Secondary | ICD-10-CM | POA: Diagnosis not present

## 2023-10-05 DIAGNOSIS — H409 Unspecified glaucoma: Secondary | ICD-10-CM | POA: Diagnosis not present

## 2023-10-05 DIAGNOSIS — E039 Hypothyroidism, unspecified: Secondary | ICD-10-CM | POA: Diagnosis not present

## 2023-10-05 DIAGNOSIS — N400 Enlarged prostate without lower urinary tract symptoms: Secondary | ICD-10-CM | POA: Diagnosis not present

## 2023-10-05 DIAGNOSIS — K219 Gastro-esophageal reflux disease without esophagitis: Secondary | ICD-10-CM | POA: Diagnosis not present

## 2023-10-09 DIAGNOSIS — R260 Ataxic gait: Secondary | ICD-10-CM | POA: Diagnosis not present

## 2023-10-09 DIAGNOSIS — M545 Low back pain, unspecified: Secondary | ICD-10-CM | POA: Diagnosis not present

## 2023-10-10 ENCOUNTER — Ambulatory Visit: Admitting: Pulmonary Disease

## 2023-10-11 DIAGNOSIS — N529 Male erectile dysfunction, unspecified: Secondary | ICD-10-CM | POA: Diagnosis not present

## 2023-10-11 DIAGNOSIS — E7800 Pure hypercholesterolemia, unspecified: Secondary | ICD-10-CM | POA: Diagnosis not present

## 2023-10-11 DIAGNOSIS — H409 Unspecified glaucoma: Secondary | ICD-10-CM | POA: Diagnosis not present

## 2023-10-11 DIAGNOSIS — M1030 Gout due to renal impairment, unspecified site: Secondary | ICD-10-CM | POA: Diagnosis not present

## 2023-10-11 DIAGNOSIS — G2581 Restless legs syndrome: Secondary | ICD-10-CM | POA: Diagnosis not present

## 2023-10-11 DIAGNOSIS — F32A Depression, unspecified: Secondary | ICD-10-CM | POA: Diagnosis not present

## 2023-10-11 DIAGNOSIS — E039 Hypothyroidism, unspecified: Secondary | ICD-10-CM | POA: Diagnosis not present

## 2023-10-11 DIAGNOSIS — A409 Streptococcal sepsis, unspecified: Secondary | ICD-10-CM | POA: Diagnosis not present

## 2023-10-11 DIAGNOSIS — G8929 Other chronic pain: Secondary | ICD-10-CM | POA: Diagnosis not present

## 2023-10-11 DIAGNOSIS — I38 Endocarditis, valve unspecified: Secondary | ICD-10-CM | POA: Diagnosis not present

## 2023-10-11 DIAGNOSIS — G629 Polyneuropathy, unspecified: Secondary | ICD-10-CM | POA: Diagnosis not present

## 2023-10-11 DIAGNOSIS — I440 Atrioventricular block, first degree: Secondary | ICD-10-CM | POA: Diagnosis not present

## 2023-10-11 DIAGNOSIS — M810 Age-related osteoporosis without current pathological fracture: Secondary | ICD-10-CM | POA: Diagnosis not present

## 2023-10-11 DIAGNOSIS — N390 Urinary tract infection, site not specified: Secondary | ICD-10-CM | POA: Diagnosis not present

## 2023-10-11 DIAGNOSIS — R1310 Dysphagia, unspecified: Secondary | ICD-10-CM | POA: Diagnosis not present

## 2023-10-11 DIAGNOSIS — F419 Anxiety disorder, unspecified: Secondary | ICD-10-CM | POA: Diagnosis not present

## 2023-10-11 DIAGNOSIS — I452 Bifascicular block: Secondary | ICD-10-CM | POA: Diagnosis not present

## 2023-10-11 DIAGNOSIS — G4733 Obstructive sleep apnea (adult) (pediatric): Secondary | ICD-10-CM | POA: Diagnosis not present

## 2023-10-11 DIAGNOSIS — N400 Enlarged prostate without lower urinary tract symptoms: Secondary | ICD-10-CM | POA: Diagnosis not present

## 2023-10-11 DIAGNOSIS — M545 Low back pain, unspecified: Secondary | ICD-10-CM | POA: Diagnosis not present

## 2023-10-11 DIAGNOSIS — K219 Gastro-esophageal reflux disease without esophagitis: Secondary | ICD-10-CM | POA: Diagnosis not present

## 2023-10-11 DIAGNOSIS — N1831 Chronic kidney disease, stage 3a: Secondary | ICD-10-CM | POA: Diagnosis not present

## 2023-10-11 DIAGNOSIS — J432 Centrilobular emphysema: Secondary | ICD-10-CM | POA: Diagnosis not present

## 2023-10-11 DIAGNOSIS — I129 Hypertensive chronic kidney disease with stage 1 through stage 4 chronic kidney disease, or unspecified chronic kidney disease: Secondary | ICD-10-CM | POA: Diagnosis not present

## 2023-10-11 DIAGNOSIS — I2510 Atherosclerotic heart disease of native coronary artery without angina pectoris: Secondary | ICD-10-CM | POA: Diagnosis not present

## 2023-10-15 DIAGNOSIS — R338 Other retention of urine: Secondary | ICD-10-CM | POA: Diagnosis not present

## 2023-10-16 ENCOUNTER — Telehealth: Payer: Self-pay | Admitting: *Deleted

## 2023-10-16 DIAGNOSIS — R260 Ataxic gait: Secondary | ICD-10-CM | POA: Diagnosis not present

## 2023-10-16 DIAGNOSIS — N390 Urinary tract infection, site not specified: Secondary | ICD-10-CM | POA: Diagnosis not present

## 2023-10-16 DIAGNOSIS — N1831 Chronic kidney disease, stage 3a: Secondary | ICD-10-CM | POA: Diagnosis not present

## 2023-10-16 DIAGNOSIS — M545 Low back pain, unspecified: Secondary | ICD-10-CM | POA: Diagnosis not present

## 2023-10-16 DIAGNOSIS — I129 Hypertensive chronic kidney disease with stage 1 through stage 4 chronic kidney disease, or unspecified chronic kidney disease: Secondary | ICD-10-CM | POA: Diagnosis not present

## 2023-10-16 DIAGNOSIS — A409 Streptococcal sepsis, unspecified: Secondary | ICD-10-CM | POA: Diagnosis not present

## 2023-10-16 NOTE — Telephone Encounter (Signed)
 Copied from CRM [id number]. Topic: General - Other >> Oct 10, 2023  2:26 PM Margarette Shawl wrote: Reason for CRM:   Pt contacted clinic around 2:17 PM due to being lost and unable to find clinic. His appt was scheduled for 1:45 PM and I advised more than likely they would be unable to see him. Assisted with directions for future visit and attempted to reschedule; however, he said he would contact clinic back once he was home.  Routing to admin pool to get him rescheduled, thanks!

## 2023-10-16 NOTE — Telephone Encounter (Signed)
 Attempted to call patient. Left voicemail for patient to call office back to get scheduled.

## 2023-10-17 NOTE — Telephone Encounter (Signed)
 LM today as well.

## 2023-10-18 DIAGNOSIS — R260 Ataxic gait: Secondary | ICD-10-CM | POA: Diagnosis not present

## 2023-10-18 DIAGNOSIS — M545 Low back pain, unspecified: Secondary | ICD-10-CM | POA: Diagnosis not present

## 2023-10-25 DIAGNOSIS — R1310 Dysphagia, unspecified: Secondary | ICD-10-CM | POA: Diagnosis not present

## 2023-10-25 DIAGNOSIS — K219 Gastro-esophageal reflux disease without esophagitis: Secondary | ICD-10-CM | POA: Diagnosis not present

## 2023-10-25 DIAGNOSIS — G2581 Restless legs syndrome: Secondary | ICD-10-CM | POA: Diagnosis not present

## 2023-10-25 DIAGNOSIS — G629 Polyneuropathy, unspecified: Secondary | ICD-10-CM | POA: Diagnosis not present

## 2023-10-25 DIAGNOSIS — A409 Streptococcal sepsis, unspecified: Secondary | ICD-10-CM | POA: Diagnosis not present

## 2023-10-25 DIAGNOSIS — F419 Anxiety disorder, unspecified: Secondary | ICD-10-CM | POA: Diagnosis not present

## 2023-10-25 DIAGNOSIS — N400 Enlarged prostate without lower urinary tract symptoms: Secondary | ICD-10-CM | POA: Diagnosis not present

## 2023-10-25 DIAGNOSIS — H409 Unspecified glaucoma: Secondary | ICD-10-CM | POA: Diagnosis not present

## 2023-10-25 DIAGNOSIS — M810 Age-related osteoporosis without current pathological fracture: Secondary | ICD-10-CM | POA: Diagnosis not present

## 2023-10-25 DIAGNOSIS — I440 Atrioventricular block, first degree: Secondary | ICD-10-CM | POA: Diagnosis not present

## 2023-10-25 DIAGNOSIS — J432 Centrilobular emphysema: Secondary | ICD-10-CM | POA: Diagnosis not present

## 2023-10-25 DIAGNOSIS — N1831 Chronic kidney disease, stage 3a: Secondary | ICD-10-CM | POA: Diagnosis not present

## 2023-10-25 DIAGNOSIS — I38 Endocarditis, valve unspecified: Secondary | ICD-10-CM | POA: Diagnosis not present

## 2023-10-25 DIAGNOSIS — E7800 Pure hypercholesterolemia, unspecified: Secondary | ICD-10-CM | POA: Diagnosis not present

## 2023-10-25 DIAGNOSIS — N529 Male erectile dysfunction, unspecified: Secondary | ICD-10-CM | POA: Diagnosis not present

## 2023-10-25 DIAGNOSIS — I452 Bifascicular block: Secondary | ICD-10-CM | POA: Diagnosis not present

## 2023-10-25 DIAGNOSIS — M1030 Gout due to renal impairment, unspecified site: Secondary | ICD-10-CM | POA: Diagnosis not present

## 2023-10-25 DIAGNOSIS — E039 Hypothyroidism, unspecified: Secondary | ICD-10-CM | POA: Diagnosis not present

## 2023-10-25 DIAGNOSIS — M545 Low back pain, unspecified: Secondary | ICD-10-CM | POA: Diagnosis not present

## 2023-10-25 DIAGNOSIS — I129 Hypertensive chronic kidney disease with stage 1 through stage 4 chronic kidney disease, or unspecified chronic kidney disease: Secondary | ICD-10-CM | POA: Diagnosis not present

## 2023-10-25 DIAGNOSIS — I2510 Atherosclerotic heart disease of native coronary artery without angina pectoris: Secondary | ICD-10-CM | POA: Diagnosis not present

## 2023-10-25 DIAGNOSIS — G8929 Other chronic pain: Secondary | ICD-10-CM | POA: Diagnosis not present

## 2023-10-25 DIAGNOSIS — G4733 Obstructive sleep apnea (adult) (pediatric): Secondary | ICD-10-CM | POA: Diagnosis not present

## 2023-10-25 DIAGNOSIS — N390 Urinary tract infection, site not specified: Secondary | ICD-10-CM | POA: Diagnosis not present

## 2023-10-25 DIAGNOSIS — F32A Depression, unspecified: Secondary | ICD-10-CM | POA: Diagnosis not present

## 2023-11-01 DIAGNOSIS — G8929 Other chronic pain: Secondary | ICD-10-CM | POA: Diagnosis not present

## 2023-11-01 DIAGNOSIS — K219 Gastro-esophageal reflux disease without esophagitis: Secondary | ICD-10-CM | POA: Diagnosis not present

## 2023-11-01 DIAGNOSIS — I440 Atrioventricular block, first degree: Secondary | ICD-10-CM | POA: Diagnosis not present

## 2023-11-01 DIAGNOSIS — F419 Anxiety disorder, unspecified: Secondary | ICD-10-CM | POA: Diagnosis not present

## 2023-11-01 DIAGNOSIS — I2510 Atherosclerotic heart disease of native coronary artery without angina pectoris: Secondary | ICD-10-CM | POA: Diagnosis not present

## 2023-11-01 DIAGNOSIS — N1831 Chronic kidney disease, stage 3a: Secondary | ICD-10-CM | POA: Diagnosis not present

## 2023-11-01 DIAGNOSIS — G629 Polyneuropathy, unspecified: Secondary | ICD-10-CM | POA: Diagnosis not present

## 2023-11-01 DIAGNOSIS — M810 Age-related osteoporosis without current pathological fracture: Secondary | ICD-10-CM | POA: Diagnosis not present

## 2023-11-01 DIAGNOSIS — E039 Hypothyroidism, unspecified: Secondary | ICD-10-CM | POA: Diagnosis not present

## 2023-11-01 DIAGNOSIS — M545 Low back pain, unspecified: Secondary | ICD-10-CM | POA: Diagnosis not present

## 2023-11-01 DIAGNOSIS — N400 Enlarged prostate without lower urinary tract symptoms: Secondary | ICD-10-CM | POA: Diagnosis not present

## 2023-11-01 DIAGNOSIS — G4733 Obstructive sleep apnea (adult) (pediatric): Secondary | ICD-10-CM | POA: Diagnosis not present

## 2023-11-01 DIAGNOSIS — N529 Male erectile dysfunction, unspecified: Secondary | ICD-10-CM | POA: Diagnosis not present

## 2023-11-01 DIAGNOSIS — J432 Centrilobular emphysema: Secondary | ICD-10-CM | POA: Diagnosis not present

## 2023-11-01 DIAGNOSIS — I38 Endocarditis, valve unspecified: Secondary | ICD-10-CM | POA: Diagnosis not present

## 2023-11-01 DIAGNOSIS — I452 Bifascicular block: Secondary | ICD-10-CM | POA: Diagnosis not present

## 2023-11-01 DIAGNOSIS — M1030 Gout due to renal impairment, unspecified site: Secondary | ICD-10-CM | POA: Diagnosis not present

## 2023-11-01 DIAGNOSIS — G2581 Restless legs syndrome: Secondary | ICD-10-CM | POA: Diagnosis not present

## 2023-11-01 DIAGNOSIS — I129 Hypertensive chronic kidney disease with stage 1 through stage 4 chronic kidney disease, or unspecified chronic kidney disease: Secondary | ICD-10-CM | POA: Diagnosis not present

## 2023-11-01 DIAGNOSIS — H409 Unspecified glaucoma: Secondary | ICD-10-CM | POA: Diagnosis not present

## 2023-11-01 DIAGNOSIS — A409 Streptococcal sepsis, unspecified: Secondary | ICD-10-CM | POA: Diagnosis not present

## 2023-11-01 DIAGNOSIS — N390 Urinary tract infection, site not specified: Secondary | ICD-10-CM | POA: Diagnosis not present

## 2023-11-01 DIAGNOSIS — F32A Depression, unspecified: Secondary | ICD-10-CM | POA: Diagnosis not present

## 2023-11-01 DIAGNOSIS — E7800 Pure hypercholesterolemia, unspecified: Secondary | ICD-10-CM | POA: Diagnosis not present

## 2023-11-01 DIAGNOSIS — R1310 Dysphagia, unspecified: Secondary | ICD-10-CM | POA: Diagnosis not present

## 2023-11-05 DIAGNOSIS — R1310 Dysphagia, unspecified: Secondary | ICD-10-CM | POA: Diagnosis not present

## 2023-11-05 DIAGNOSIS — N390 Urinary tract infection, site not specified: Secondary | ICD-10-CM | POA: Diagnosis not present

## 2023-11-05 DIAGNOSIS — N400 Enlarged prostate without lower urinary tract symptoms: Secondary | ICD-10-CM | POA: Diagnosis not present

## 2023-11-05 DIAGNOSIS — G4733 Obstructive sleep apnea (adult) (pediatric): Secondary | ICD-10-CM | POA: Diagnosis not present

## 2023-11-05 DIAGNOSIS — F32A Depression, unspecified: Secondary | ICD-10-CM | POA: Diagnosis not present

## 2023-11-05 DIAGNOSIS — A409 Streptococcal sepsis, unspecified: Secondary | ICD-10-CM | POA: Diagnosis not present

## 2023-11-05 DIAGNOSIS — M810 Age-related osteoporosis without current pathological fracture: Secondary | ICD-10-CM | POA: Diagnosis not present

## 2023-11-05 DIAGNOSIS — I440 Atrioventricular block, first degree: Secondary | ICD-10-CM | POA: Diagnosis not present

## 2023-11-05 DIAGNOSIS — J432 Centrilobular emphysema: Secondary | ICD-10-CM | POA: Diagnosis not present

## 2023-11-05 DIAGNOSIS — G629 Polyneuropathy, unspecified: Secondary | ICD-10-CM | POA: Diagnosis not present

## 2023-11-05 DIAGNOSIS — I38 Endocarditis, valve unspecified: Secondary | ICD-10-CM | POA: Diagnosis not present

## 2023-11-05 DIAGNOSIS — I452 Bifascicular block: Secondary | ICD-10-CM | POA: Diagnosis not present

## 2023-11-05 DIAGNOSIS — I2510 Atherosclerotic heart disease of native coronary artery without angina pectoris: Secondary | ICD-10-CM | POA: Diagnosis not present

## 2023-11-05 DIAGNOSIS — E039 Hypothyroidism, unspecified: Secondary | ICD-10-CM | POA: Diagnosis not present

## 2023-11-05 DIAGNOSIS — M1030 Gout due to renal impairment, unspecified site: Secondary | ICD-10-CM | POA: Diagnosis not present

## 2023-11-05 DIAGNOSIS — N529 Male erectile dysfunction, unspecified: Secondary | ICD-10-CM | POA: Diagnosis not present

## 2023-11-05 DIAGNOSIS — I129 Hypertensive chronic kidney disease with stage 1 through stage 4 chronic kidney disease, or unspecified chronic kidney disease: Secondary | ICD-10-CM | POA: Diagnosis not present

## 2023-11-05 DIAGNOSIS — G2581 Restless legs syndrome: Secondary | ICD-10-CM | POA: Diagnosis not present

## 2023-11-05 DIAGNOSIS — N1831 Chronic kidney disease, stage 3a: Secondary | ICD-10-CM | POA: Diagnosis not present

## 2023-11-05 DIAGNOSIS — H409 Unspecified glaucoma: Secondary | ICD-10-CM | POA: Diagnosis not present

## 2023-11-05 DIAGNOSIS — G8929 Other chronic pain: Secondary | ICD-10-CM | POA: Diagnosis not present

## 2023-11-05 DIAGNOSIS — M545 Low back pain, unspecified: Secondary | ICD-10-CM | POA: Diagnosis not present

## 2023-11-05 DIAGNOSIS — F419 Anxiety disorder, unspecified: Secondary | ICD-10-CM | POA: Diagnosis not present

## 2023-11-05 DIAGNOSIS — K219 Gastro-esophageal reflux disease without esophagitis: Secondary | ICD-10-CM | POA: Diagnosis not present

## 2023-11-05 DIAGNOSIS — E7800 Pure hypercholesterolemia, unspecified: Secondary | ICD-10-CM | POA: Diagnosis not present

## 2023-11-12 DIAGNOSIS — R338 Other retention of urine: Secondary | ICD-10-CM | POA: Diagnosis not present

## 2023-11-13 DIAGNOSIS — E039 Hypothyroidism, unspecified: Secondary | ICD-10-CM | POA: Diagnosis not present

## 2023-11-15 DIAGNOSIS — E039 Hypothyroidism, unspecified: Secondary | ICD-10-CM | POA: Diagnosis not present

## 2023-11-19 DIAGNOSIS — G629 Polyneuropathy, unspecified: Secondary | ICD-10-CM | POA: Diagnosis not present

## 2023-11-19 DIAGNOSIS — N529 Male erectile dysfunction, unspecified: Secondary | ICD-10-CM | POA: Diagnosis not present

## 2023-11-19 DIAGNOSIS — G8929 Other chronic pain: Secondary | ICD-10-CM | POA: Diagnosis not present

## 2023-11-19 DIAGNOSIS — J432 Centrilobular emphysema: Secondary | ICD-10-CM | POA: Diagnosis not present

## 2023-11-19 DIAGNOSIS — F419 Anxiety disorder, unspecified: Secondary | ICD-10-CM | POA: Diagnosis not present

## 2023-11-19 DIAGNOSIS — G4733 Obstructive sleep apnea (adult) (pediatric): Secondary | ICD-10-CM | POA: Diagnosis not present

## 2023-11-19 DIAGNOSIS — M545 Low back pain, unspecified: Secondary | ICD-10-CM | POA: Diagnosis not present

## 2023-11-19 DIAGNOSIS — I2510 Atherosclerotic heart disease of native coronary artery without angina pectoris: Secondary | ICD-10-CM | POA: Diagnosis not present

## 2023-11-19 DIAGNOSIS — E7800 Pure hypercholesterolemia, unspecified: Secondary | ICD-10-CM | POA: Diagnosis not present

## 2023-11-19 DIAGNOSIS — H409 Unspecified glaucoma: Secondary | ICD-10-CM | POA: Diagnosis not present

## 2023-11-19 DIAGNOSIS — E039 Hypothyroidism, unspecified: Secondary | ICD-10-CM | POA: Diagnosis not present

## 2023-11-19 DIAGNOSIS — K219 Gastro-esophageal reflux disease without esophagitis: Secondary | ICD-10-CM | POA: Diagnosis not present

## 2023-11-19 DIAGNOSIS — M1030 Gout due to renal impairment, unspecified site: Secondary | ICD-10-CM | POA: Diagnosis not present

## 2023-11-19 DIAGNOSIS — R1310 Dysphagia, unspecified: Secondary | ICD-10-CM | POA: Diagnosis not present

## 2023-11-19 DIAGNOSIS — I129 Hypertensive chronic kidney disease with stage 1 through stage 4 chronic kidney disease, or unspecified chronic kidney disease: Secondary | ICD-10-CM | POA: Diagnosis not present

## 2023-11-19 DIAGNOSIS — I440 Atrioventricular block, first degree: Secondary | ICD-10-CM | POA: Diagnosis not present

## 2023-11-19 DIAGNOSIS — F32A Depression, unspecified: Secondary | ICD-10-CM | POA: Diagnosis not present

## 2023-11-19 DIAGNOSIS — N390 Urinary tract infection, site not specified: Secondary | ICD-10-CM | POA: Diagnosis not present

## 2023-11-19 DIAGNOSIS — I38 Endocarditis, valve unspecified: Secondary | ICD-10-CM | POA: Diagnosis not present

## 2023-11-19 DIAGNOSIS — M810 Age-related osteoporosis without current pathological fracture: Secondary | ICD-10-CM | POA: Diagnosis not present

## 2023-11-19 DIAGNOSIS — N400 Enlarged prostate without lower urinary tract symptoms: Secondary | ICD-10-CM | POA: Diagnosis not present

## 2023-11-19 DIAGNOSIS — N1831 Chronic kidney disease, stage 3a: Secondary | ICD-10-CM | POA: Diagnosis not present

## 2023-11-19 DIAGNOSIS — G2581 Restless legs syndrome: Secondary | ICD-10-CM | POA: Diagnosis not present

## 2023-11-19 DIAGNOSIS — A409 Streptococcal sepsis, unspecified: Secondary | ICD-10-CM | POA: Diagnosis not present

## 2023-11-19 DIAGNOSIS — I452 Bifascicular block: Secondary | ICD-10-CM | POA: Diagnosis not present

## 2023-11-29 DIAGNOSIS — H353222 Exudative age-related macular degeneration, left eye, with inactive choroidal neovascularization: Secondary | ICD-10-CM | POA: Diagnosis not present

## 2023-11-29 DIAGNOSIS — I2510 Atherosclerotic heart disease of native coronary artery without angina pectoris: Secondary | ICD-10-CM | POA: Diagnosis not present

## 2023-11-29 DIAGNOSIS — H43822 Vitreomacular adhesion, left eye: Secondary | ICD-10-CM | POA: Diagnosis not present

## 2023-11-29 DIAGNOSIS — H35371 Puckering of macula, right eye: Secondary | ICD-10-CM | POA: Diagnosis not present

## 2023-11-29 DIAGNOSIS — H353134 Nonexudative age-related macular degeneration, bilateral, advanced atrophic with subfoveal involvement: Secondary | ICD-10-CM | POA: Diagnosis not present

## 2023-11-29 DIAGNOSIS — N1831 Chronic kidney disease, stage 3a: Secondary | ICD-10-CM | POA: Diagnosis not present

## 2023-11-29 DIAGNOSIS — J432 Centrilobular emphysema: Secondary | ICD-10-CM | POA: Diagnosis not present

## 2023-12-05 DIAGNOSIS — E7800 Pure hypercholesterolemia, unspecified: Secondary | ICD-10-CM | POA: Diagnosis not present

## 2023-12-05 DIAGNOSIS — M1030 Gout due to renal impairment, unspecified site: Secondary | ICD-10-CM | POA: Diagnosis not present

## 2023-12-05 DIAGNOSIS — M545 Low back pain, unspecified: Secondary | ICD-10-CM | POA: Diagnosis not present

## 2023-12-05 DIAGNOSIS — N1831 Chronic kidney disease, stage 3a: Secondary | ICD-10-CM | POA: Diagnosis not present

## 2023-12-05 DIAGNOSIS — F32A Depression, unspecified: Secondary | ICD-10-CM | POA: Diagnosis not present

## 2023-12-05 DIAGNOSIS — I2510 Atherosclerotic heart disease of native coronary artery without angina pectoris: Secondary | ICD-10-CM | POA: Diagnosis not present

## 2023-12-05 DIAGNOSIS — D696 Thrombocytopenia, unspecified: Secondary | ICD-10-CM | POA: Diagnosis not present

## 2023-12-05 DIAGNOSIS — I440 Atrioventricular block, first degree: Secondary | ICD-10-CM | POA: Diagnosis not present

## 2023-12-05 DIAGNOSIS — J432 Centrilobular emphysema: Secondary | ICD-10-CM | POA: Diagnosis not present

## 2023-12-05 DIAGNOSIS — G4700 Insomnia, unspecified: Secondary | ICD-10-CM | POA: Diagnosis not present

## 2023-12-05 DIAGNOSIS — G629 Polyneuropathy, unspecified: Secondary | ICD-10-CM | POA: Diagnosis not present

## 2023-12-05 DIAGNOSIS — K5790 Diverticulosis of intestine, part unspecified, without perforation or abscess without bleeding: Secondary | ICD-10-CM | POA: Diagnosis not present

## 2023-12-05 DIAGNOSIS — N400 Enlarged prostate without lower urinary tract symptoms: Secondary | ICD-10-CM | POA: Diagnosis not present

## 2023-12-05 DIAGNOSIS — G4733 Obstructive sleep apnea (adult) (pediatric): Secondary | ICD-10-CM | POA: Diagnosis not present

## 2023-12-05 DIAGNOSIS — G2581 Restless legs syndrome: Secondary | ICD-10-CM | POA: Diagnosis not present

## 2023-12-05 DIAGNOSIS — R1310 Dysphagia, unspecified: Secondary | ICD-10-CM | POA: Diagnosis not present

## 2023-12-05 DIAGNOSIS — I452 Bifascicular block: Secondary | ICD-10-CM | POA: Diagnosis not present

## 2023-12-05 DIAGNOSIS — M810 Age-related osteoporosis without current pathological fracture: Secondary | ICD-10-CM | POA: Diagnosis not present

## 2023-12-05 DIAGNOSIS — H409 Unspecified glaucoma: Secondary | ICD-10-CM | POA: Diagnosis not present

## 2023-12-05 DIAGNOSIS — K219 Gastro-esophageal reflux disease without esophagitis: Secondary | ICD-10-CM | POA: Diagnosis not present

## 2023-12-05 DIAGNOSIS — F419 Anxiety disorder, unspecified: Secondary | ICD-10-CM | POA: Diagnosis not present

## 2023-12-05 DIAGNOSIS — G8929 Other chronic pain: Secondary | ICD-10-CM | POA: Diagnosis not present

## 2023-12-05 DIAGNOSIS — I129 Hypertensive chronic kidney disease with stage 1 through stage 4 chronic kidney disease, or unspecified chronic kidney disease: Secondary | ICD-10-CM | POA: Diagnosis not present

## 2023-12-05 DIAGNOSIS — E039 Hypothyroidism, unspecified: Secondary | ICD-10-CM | POA: Diagnosis not present

## 2023-12-05 DIAGNOSIS — N529 Male erectile dysfunction, unspecified: Secondary | ICD-10-CM | POA: Diagnosis not present

## 2023-12-08 ENCOUNTER — Other Ambulatory Visit: Payer: Self-pay

## 2023-12-08 ENCOUNTER — Encounter (HOSPITAL_BASED_OUTPATIENT_CLINIC_OR_DEPARTMENT_OTHER): Payer: Self-pay

## 2023-12-08 ENCOUNTER — Emergency Department (HOSPITAL_BASED_OUTPATIENT_CLINIC_OR_DEPARTMENT_OTHER): Admitting: Radiology

## 2023-12-08 ENCOUNTER — Emergency Department (HOSPITAL_BASED_OUTPATIENT_CLINIC_OR_DEPARTMENT_OTHER)
Admission: EM | Admit: 2023-12-08 | Discharge: 2023-12-08 | Disposition: A | Discharge status: Home / Self Care | Attending: Emergency Medicine | Admitting: Emergency Medicine

## 2023-12-08 DIAGNOSIS — S2242XA Multiple fractures of ribs, left side, initial encounter for closed fracture: Secondary | ICD-10-CM | POA: Insufficient documentation

## 2023-12-08 DIAGNOSIS — R0789 Other chest pain: Secondary | ICD-10-CM

## 2023-12-08 DIAGNOSIS — M25512 Pain in left shoulder: Secondary | ICD-10-CM | POA: Diagnosis not present

## 2023-12-08 DIAGNOSIS — X501XXA Overexertion from prolonged static or awkward postures, initial encounter: Secondary | ICD-10-CM | POA: Diagnosis not present

## 2023-12-08 DIAGNOSIS — R0781 Pleurodynia: Secondary | ICD-10-CM | POA: Diagnosis not present

## 2023-12-08 DIAGNOSIS — I700 Atherosclerosis of aorta: Secondary | ICD-10-CM | POA: Diagnosis not present

## 2023-12-08 DIAGNOSIS — S20212A Contusion of left front wall of thorax, initial encounter: Secondary | ICD-10-CM | POA: Diagnosis not present

## 2023-12-08 DIAGNOSIS — M25519 Pain in unspecified shoulder: Secondary | ICD-10-CM | POA: Diagnosis not present

## 2023-12-08 MED ORDER — ACETAMINOPHEN 500 MG PO TABS
1000.0000 mg | ORAL_TABLET | Freq: Once | ORAL | Status: AC
  Administered 2023-12-08: 1000 mg via ORAL
  Filled 2023-12-08: qty 2

## 2023-12-08 MED ORDER — OXYCODONE HCL 5 MG PO TABS
2.5000 mg | ORAL_TABLET | Freq: Once | ORAL | Status: AC
  Administered 2023-12-08: 2.5 mg via ORAL
  Filled 2023-12-08: qty 1

## 2023-12-08 MED ORDER — DICLOFENAC SODIUM 1 % EX GEL: 4.0000 g | Freq: Four times a day (QID) | CUTANEOUS | 0 refills | Status: AC

## 2023-12-08 NOTE — ED Triage Notes (Signed)
 Patient comes in via EMS he fell last week and landed on his left shoulder. There is no obvious deformity but some swelling is noted according to EMS. He has no other complaints. The EMS crew reports that he smells like a UTI. Although patient denies any urinary complaints.   Last set of vitals  142/88 HR 72 SPO2 97% RA

## 2023-12-08 NOTE — Discharge Instructions (Signed)
 No obvious rib fracture or collapsed lung.   The little pillows on the couch tend to help a lot with this.  Please hold them against your chest whenever you need to cough or sneeze or take a big deep breath.  Use the incentive spirometer 10 minutes out of every hour you are awake. Use the gel as prescribed. Also take tylenol  1000mg (2 extra strength) four times a day.

## 2023-12-08 NOTE — ED Provider Notes (Signed)
 Timothy Spencer EMERGENCY DEPARTMENT AT [facility] Provider Note   CSN: [id number] Arrival date & time: 12/08/23  9788     Patient presents with: Fall and Shoulder Pain (Left)   Timothy Spencer is a 83 y.o. male.   83 yo M with a chief complaint of a fall.  This happened about a week ago.  Patient thinks he lost his balance and fell onto his left side.  Has been having pain mostly to his left chest wall.  Worse with twisting turning palpation and deep breaths.  He denies head injury denies loss consciousness denies neck pain back pain abdominal pain lower extremity pain.  Has been able to get up and walk without issue.  He has some mild pain to the left shoulder but feels like the pain in his chest is much worse.   Fall  Shoulder Pain      Prior to Admission medications   Medication Sig Start Date End Date Taking? Authorizing Provider  diclofenac  Sodium (VOLTAREN ) 1 % GEL Apply 4 g topically 4 (four) times daily. 12/08/23  Yes Emil Share, DO  albuterol  (VENTOLIN  HFA) 108 (90 Base) MCG/ACT inhaler INHALE 2 PUFFS INTO THE LUNGS EVERY 6 HOURS AS NEEDED FOR WHEEZING OR SHORTNESS OF BREATH 02/19/23   Mannam, Praveen, MD  amLODipine  (NORVASC ) 10 MG tablet Take 10 mg by mouth daily.    [provider]  Budeson-Glycopyrrol-Formoterol  (BREZTRI  AEROSPHERE) 160-9-4.8 MCG/ACT AERO Inhale 2 puffs into the lungs in the morning and at bedtime. 11/22/21   Mannam, Praveen, MD  buPROPion  (WELLBUTRIN  XL) 150 MG 24 hr tablet Take 150 mg by mouth every morning. 07/17/21   [provider]  feeding supplement (ENSURE ENLIVE / ENSURE PLUS) LIQD Take 237 mLs by mouth 2 (two) times daily between meals. 05/13/22   Arlice Reichert, MD  latanoprost  (XALATAN ) 0.005 % ophthalmic solution Place 1 drop into both eyes at bedtime. 08/17/21   [provider]  levothyroxine  (SYNTHROID ) 112 MCG tablet Take 112 mcg by mouth daily. 07/29/19   [provider]  rOPINIRole  (REQUIP ) 1 MG tablet  Take 1 mg by mouth at bedtime. 01/01/13   [provider]  rosuvastatin  (CRESTOR ) 5 MG tablet Take 5 mg by mouth daily. 07/15/19   [provider]  senna-docusate (SENOKOT-S) 8.6-50 MG tablet Take 1 tablet by mouth at bedtime as needed for mild constipation. 05/13/22   Arlice Reichert, MD  sertraline  (ZOLOFT ) 100 MG tablet Take 200 mg by mouth daily. 01/25/13   [provider]  tamsulosin  (FLOMAX ) 0.4 MG CAPS capsule Take 0.4 mg by mouth in the morning and at bedtime. 01/31/13   [provider]  zolpidem (AMBIEN CR) 6.25 MG CR tablet Take 6.25 mg by mouth at bedtime as needed. 08/09/23   [provider]    Allergies: Hydrochlorothiazide w-spironolactone    Review of Systems  Updated Vital Signs BP (!) 148/88   Pulse 73   Temp 97.7 F (36.5 C)   Resp 18   SpO2 99%   Physical Exam Vitals and nursing note reviewed.  Constitutional:      Appearance: He is well-developed.  HENT:     Head: Normocephalic and atraumatic.  Eyes:     Pupils: Pupils are equal, round, and reactive to light.  Neck:     Vascular: No JVD.  Cardiovascular:     Rate and Rhythm: Normal rate and regular rhythm.     Heart sounds: No murmur heard.    No friction  rub. No gallop.  Pulmonary:     Effort: No respiratory distress.     Breath sounds: No wheezing.  Abdominal:     General: There is no distension.     Tenderness: There is no abdominal tenderness. There is no guarding or rebound.  Musculoskeletal:        General: Normal range of motion.     Cervical back: Normal range of motion and neck supple.     Comments: Bruising overlying the left anterior rib angles about ribs 4 through 6.  I do not appreciate any obvious discomfort to the left shoulder.  No pain along the clavicle AC joint or the proximal humerus.  Full range of motion without significant discomfort.  Palpated from head to toe without any other obvious noted areas of bony tenderness  Skin:    Coloration: Skin  is not pale.     Findings: No rash.  Neurological:     Mental Status: He is alert and oriented to person, place, and time.  Psychiatric:        Behavior: Behavior normal.     (all labs ordered are listed, but only abnormal results are displayed) Labs Reviewed - No data to display  EKG: None  Radiology: DG Ribs Unilateral W/Chest Left Result Date: 12/08/2023 CLINICAL DATA:  Fall 1 week ago with left shoulder pain and chest pain, initial encounter EXAM: LEFT RIBS AND CHEST - 3+ VIEW COMPARISON:  09/02/2023 FINDINGS: Cardiac shadow is stable. Elevation of the right hemidiaphragm is seen. Aortic calcifications are noted. Lungs are well aerated without infiltrate or pneumothorax. Old left rib fractures with healing are seen. No definitive acute rib fracture is identified. Healed left humeral fracture is noted. IMPRESSION: Multiple old left rib fractures with healing. No definitive acute rib fracture is seen. Electronically Signed   By: Oneil Devonshire M.D.   On: 12/08/2023 03:28     Procedures   Medications Ordered in the ED  acetaminophen  (TYLENOL ) tablet 1,000 mg (1,000 mg Oral Given 12/08/23 0248)  oxyCODONE  (Oxy IR/ROXICODONE ) immediate release tablet 2.5 mg (2.5 mg Oral Given 12/08/23 0248)                                    Medical Decision Making Amount and/or Complexity of Data Reviewed Radiology: ordered.  Risk OTC drugs. Prescription drug management.   83 yo M with a chief complaints of fall.  Nonsyncopal by history.  This happened about a week ago.  Complaining of ongoing left-sided chest pain.  He was triaged as left shoulder pain.  He denies any significant shoulder pain.  No obvious shoulder pain on my exam.  Will obtain a plain film of the left chest.  Plain film without obvious pneumothorax or rib fracture my independent interpretation.  Will treat supportively.  PCP follow-up.  3:49 AM:  I have discussed the diagnosis/risks/treatment options with the patient and  family.  Evaluation and diagnostic testing in the emergency department does not suggest an emergent condition requiring admission or immediate intervention beyond what has been performed at this time.  They will follow up with PCP. We also discussed returning to the ED immediately if new or worsening sx occur. We discussed the sx which are most concerning (e.g., sudden worsening pain, fever, inability to tolerate by mouth) that necessitate immediate return. Medications administered to the patient during their visit and any new prescriptions provided to the patient are  listed below.  Medications given during this visit Medications  acetaminophen  (TYLENOL ) tablet 1,000 mg (1,000 mg Oral Given 12/08/23 0248)  oxyCODONE  (Oxy IR/ROXICODONE ) immediate release tablet 2.5 mg (2.5 mg Oral Given 12/08/23 0248)     The patient appears reasonably screen and/or stabilized for discharge and I doubt any other medical condition or other [facility] requiring further screening, evaluation, or treatment in the ED at this time prior to discharge.       Final diagnoses:  Chest wall pain    ED Discharge Orders          Ordered    diclofenac  Sodium (VOLTAREN ) 1 % GEL  4 times daily        12/08/23 0337               Emil Share, DO 12/08/23 [phone]

## 2023-12-10 DIAGNOSIS — N400 Enlarged prostate without lower urinary tract symptoms: Secondary | ICD-10-CM | POA: Diagnosis not present

## 2023-12-10 DIAGNOSIS — D696 Thrombocytopenia, unspecified: Secondary | ICD-10-CM | POA: Diagnosis not present

## 2023-12-10 DIAGNOSIS — G2581 Restless legs syndrome: Secondary | ICD-10-CM | POA: Diagnosis not present

## 2023-12-10 DIAGNOSIS — M810 Age-related osteoporosis without current pathological fracture: Secondary | ICD-10-CM | POA: Diagnosis not present

## 2023-12-10 DIAGNOSIS — K5790 Diverticulosis of intestine, part unspecified, without perforation or abscess without bleeding: Secondary | ICD-10-CM | POA: Diagnosis not present

## 2023-12-10 DIAGNOSIS — I452 Bifascicular block: Secondary | ICD-10-CM | POA: Diagnosis not present

## 2023-12-10 DIAGNOSIS — F32A Depression, unspecified: Secondary | ICD-10-CM | POA: Diagnosis not present

## 2023-12-10 DIAGNOSIS — M1030 Gout due to renal impairment, unspecified site: Secondary | ICD-10-CM | POA: Diagnosis not present

## 2023-12-10 DIAGNOSIS — G4733 Obstructive sleep apnea (adult) (pediatric): Secondary | ICD-10-CM | POA: Diagnosis not present

## 2023-12-10 DIAGNOSIS — F419 Anxiety disorder, unspecified: Secondary | ICD-10-CM | POA: Diagnosis not present

## 2023-12-10 DIAGNOSIS — H409 Unspecified glaucoma: Secondary | ICD-10-CM | POA: Diagnosis not present

## 2023-12-10 DIAGNOSIS — J432 Centrilobular emphysema: Secondary | ICD-10-CM | POA: Diagnosis not present

## 2023-12-10 DIAGNOSIS — G8929 Other chronic pain: Secondary | ICD-10-CM | POA: Diagnosis not present

## 2023-12-10 DIAGNOSIS — N1831 Chronic kidney disease, stage 3a: Secondary | ICD-10-CM | POA: Diagnosis not present

## 2023-12-10 DIAGNOSIS — I2510 Atherosclerotic heart disease of native coronary artery without angina pectoris: Secondary | ICD-10-CM | POA: Diagnosis not present

## 2023-12-10 DIAGNOSIS — R1310 Dysphagia, unspecified: Secondary | ICD-10-CM | POA: Diagnosis not present

## 2023-12-10 DIAGNOSIS — E7800 Pure hypercholesterolemia, unspecified: Secondary | ICD-10-CM | POA: Diagnosis not present

## 2023-12-10 DIAGNOSIS — G629 Polyneuropathy, unspecified: Secondary | ICD-10-CM | POA: Diagnosis not present

## 2023-12-10 DIAGNOSIS — I440 Atrioventricular block, first degree: Secondary | ICD-10-CM | POA: Diagnosis not present

## 2023-12-10 DIAGNOSIS — N529 Male erectile dysfunction, unspecified: Secondary | ICD-10-CM | POA: Diagnosis not present

## 2023-12-10 DIAGNOSIS — I129 Hypertensive chronic kidney disease with stage 1 through stage 4 chronic kidney disease, or unspecified chronic kidney disease: Secondary | ICD-10-CM | POA: Diagnosis not present

## 2023-12-10 DIAGNOSIS — G4700 Insomnia, unspecified: Secondary | ICD-10-CM | POA: Diagnosis not present

## 2023-12-10 DIAGNOSIS — M545 Low back pain, unspecified: Secondary | ICD-10-CM | POA: Diagnosis not present

## 2023-12-10 DIAGNOSIS — E039 Hypothyroidism, unspecified: Secondary | ICD-10-CM | POA: Diagnosis not present

## 2023-12-10 DIAGNOSIS — K219 Gastro-esophageal reflux disease without esophagitis: Secondary | ICD-10-CM | POA: Diagnosis not present

## 2023-12-13 DIAGNOSIS — R338 Other retention of urine: Secondary | ICD-10-CM | POA: Diagnosis not present

## 2023-12-13 DIAGNOSIS — E039 Hypothyroidism, unspecified: Secondary | ICD-10-CM | POA: Diagnosis not present

## 2023-12-17 DIAGNOSIS — K080 Exfoliation of teeth due to systemic causes: Secondary | ICD-10-CM | POA: Diagnosis not present

## 2023-12-19 DIAGNOSIS — E7800 Pure hypercholesterolemia, unspecified: Secondary | ICD-10-CM | POA: Diagnosis not present

## 2023-12-19 DIAGNOSIS — I452 Bifascicular block: Secondary | ICD-10-CM | POA: Diagnosis not present

## 2023-12-19 DIAGNOSIS — E039 Hypothyroidism, unspecified: Secondary | ICD-10-CM | POA: Diagnosis not present

## 2023-12-19 DIAGNOSIS — G8929 Other chronic pain: Secondary | ICD-10-CM | POA: Diagnosis not present

## 2023-12-19 DIAGNOSIS — N1831 Chronic kidney disease, stage 3a: Secondary | ICD-10-CM | POA: Diagnosis not present

## 2023-12-19 DIAGNOSIS — I440 Atrioventricular block, first degree: Secondary | ICD-10-CM | POA: Diagnosis not present

## 2023-12-19 DIAGNOSIS — G4700 Insomnia, unspecified: Secondary | ICD-10-CM | POA: Diagnosis not present

## 2023-12-19 DIAGNOSIS — N400 Enlarged prostate without lower urinary tract symptoms: Secondary | ICD-10-CM | POA: Diagnosis not present

## 2023-12-19 DIAGNOSIS — G4733 Obstructive sleep apnea (adult) (pediatric): Secondary | ICD-10-CM | POA: Diagnosis not present

## 2023-12-19 DIAGNOSIS — M545 Low back pain, unspecified: Secondary | ICD-10-CM | POA: Diagnosis not present

## 2023-12-19 DIAGNOSIS — D696 Thrombocytopenia, unspecified: Secondary | ICD-10-CM | POA: Diagnosis not present

## 2023-12-19 DIAGNOSIS — K219 Gastro-esophageal reflux disease without esophagitis: Secondary | ICD-10-CM | POA: Diagnosis not present

## 2023-12-19 DIAGNOSIS — I129 Hypertensive chronic kidney disease with stage 1 through stage 4 chronic kidney disease, or unspecified chronic kidney disease: Secondary | ICD-10-CM | POA: Diagnosis not present

## 2023-12-19 DIAGNOSIS — G629 Polyneuropathy, unspecified: Secondary | ICD-10-CM | POA: Diagnosis not present

## 2023-12-19 DIAGNOSIS — N529 Male erectile dysfunction, unspecified: Secondary | ICD-10-CM | POA: Diagnosis not present

## 2023-12-19 DIAGNOSIS — H409 Unspecified glaucoma: Secondary | ICD-10-CM | POA: Diagnosis not present

## 2023-12-19 DIAGNOSIS — F32A Depression, unspecified: Secondary | ICD-10-CM | POA: Diagnosis not present

## 2023-12-19 DIAGNOSIS — M810 Age-related osteoporosis without current pathological fracture: Secondary | ICD-10-CM | POA: Diagnosis not present

## 2023-12-19 DIAGNOSIS — I2510 Atherosclerotic heart disease of native coronary artery without angina pectoris: Secondary | ICD-10-CM | POA: Diagnosis not present

## 2023-12-19 DIAGNOSIS — K5790 Diverticulosis of intestine, part unspecified, without perforation or abscess without bleeding: Secondary | ICD-10-CM | POA: Diagnosis not present

## 2023-12-19 DIAGNOSIS — J432 Centrilobular emphysema: Secondary | ICD-10-CM | POA: Diagnosis not present

## 2023-12-19 DIAGNOSIS — R1310 Dysphagia, unspecified: Secondary | ICD-10-CM | POA: Diagnosis not present

## 2023-12-19 DIAGNOSIS — F419 Anxiety disorder, unspecified: Secondary | ICD-10-CM | POA: Diagnosis not present

## 2023-12-19 DIAGNOSIS — G2581 Restless legs syndrome: Secondary | ICD-10-CM | POA: Diagnosis not present

## 2023-12-19 DIAGNOSIS — M1030 Gout due to renal impairment, unspecified site: Secondary | ICD-10-CM | POA: Diagnosis not present

## 2023-12-25 ENCOUNTER — Emergency Department (HOSPITAL_COMMUNITY)

## 2023-12-25 ENCOUNTER — Emergency Department (HOSPITAL_COMMUNITY)
Admission: EM | Admit: 2023-12-25 | Discharge: 2023-12-25 | Disposition: A | Discharge status: Home / Self Care | Attending: Student | Admitting: Student

## 2023-12-25 ENCOUNTER — Encounter (HOSPITAL_COMMUNITY): Payer: Self-pay

## 2023-12-25 ENCOUNTER — Other Ambulatory Visit: Payer: Self-pay

## 2023-12-25 DIAGNOSIS — Z87891 Personal history of nicotine dependence: Secondary | ICD-10-CM | POA: Diagnosis not present

## 2023-12-25 DIAGNOSIS — R000 Tachycardia, unspecified: Secondary | ICD-10-CM | POA: Diagnosis not present

## 2023-12-25 DIAGNOSIS — E039 Hypothyroidism, unspecified: Secondary | ICD-10-CM | POA: Diagnosis not present

## 2023-12-25 DIAGNOSIS — J181 Lobar pneumonia, unspecified organism: Secondary | ICD-10-CM | POA: Diagnosis not present

## 2023-12-25 DIAGNOSIS — J449 Chronic obstructive pulmonary disease, unspecified: Secondary | ICD-10-CM | POA: Insufficient documentation

## 2023-12-25 DIAGNOSIS — Z79890 Hormone replacement therapy (postmenopausal): Secondary | ICD-10-CM | POA: Diagnosis not present

## 2023-12-25 DIAGNOSIS — R079 Chest pain, unspecified: Secondary | ICD-10-CM | POA: Diagnosis not present

## 2023-12-25 DIAGNOSIS — R918 Other nonspecific abnormal finding of lung field: Secondary | ICD-10-CM | POA: Diagnosis not present

## 2023-12-25 DIAGNOSIS — J168 Pneumonia due to other specified infectious organisms: Secondary | ICD-10-CM | POA: Insufficient documentation

## 2023-12-25 DIAGNOSIS — R9389 Abnormal findings on diagnostic imaging of other specified body structures: Secondary | ICD-10-CM | POA: Diagnosis not present

## 2023-12-25 DIAGNOSIS — I2510 Atherosclerotic heart disease of native coronary artery without angina pectoris: Secondary | ICD-10-CM | POA: Diagnosis not present

## 2023-12-25 DIAGNOSIS — Z79899 Other long term (current) drug therapy: Secondary | ICD-10-CM | POA: Diagnosis not present

## 2023-12-25 DIAGNOSIS — I10 Essential (primary) hypertension: Secondary | ICD-10-CM | POA: Insufficient documentation

## 2023-12-25 DIAGNOSIS — J984 Other disorders of lung: Secondary | ICD-10-CM | POA: Diagnosis not present

## 2023-12-25 DIAGNOSIS — R0789 Other chest pain: Secondary | ICD-10-CM | POA: Diagnosis not present

## 2023-12-25 DIAGNOSIS — J9811 Atelectasis: Secondary | ICD-10-CM | POA: Diagnosis not present

## 2023-12-25 DIAGNOSIS — N390 Urinary tract infection, site not specified: Secondary | ICD-10-CM | POA: Diagnosis not present

## 2023-12-25 DIAGNOSIS — J189 Pneumonia, unspecified organism: Secondary | ICD-10-CM

## 2023-12-25 LAB — TROPONIN I (HIGH SENSITIVITY)
Troponin I (High Sensitivity): 6 ng/L (ref <18)
Troponin I (High Sensitivity): 5 ng/L (ref <18)

## 2023-12-25 LAB — CBC WITH DIFFERENTIAL/PLATELET
Abs Immature Granulocytes: 0.03 K/uL (ref 0.00–0.07)
Basophils Absolute: 0.1 K/uL (ref 0.0–0.1)
Basophils Relative: 1 %
Eosinophils Absolute: 0.1 K/uL (ref 0.0–0.5)
Eosinophils Relative: 2 %
HCT: 38 % — ABNORMAL LOW (ref 39.0–52.0)
Hemoglobin: 12.8 g/dL — ABNORMAL LOW (ref 13.0–17.0)
Immature Granulocytes: 0 %
Lymphocytes Relative: 20 %
Lymphs Abs: 1.7 K/uL (ref 0.7–4.0)
MCH: 30.8 pg (ref 26.0–34.0)
MCHC: 33.7 g/dL (ref 30.0–36.0)
MCV: 91.6 fL (ref 80.0–100.0)
Monocytes Absolute: 0.5 K/uL (ref 0.1–1.0)
Monocytes Relative: 7 %
Neutro Abs: 5.8 K/uL (ref 1.7–7.7)
Neutrophils Relative %: 70 %
Platelets: 168 K/uL (ref 150–400)
RBC: 4.15 MIL/uL — ABNORMAL LOW (ref 4.22–5.81)
RDW: 14.5 % (ref 11.5–15.5)
WBC: 8.2 K/uL (ref 4.0–10.5)
nRBC: 0 % (ref 0.0–0.2)

## 2023-12-25 LAB — COMPREHENSIVE METABOLIC PANEL WITH GFR
ALT: 12 U/L (ref 0–44)
AST: 21 U/L (ref 15–41)
Albumin: 3.5 g/dL (ref 3.5–5.0)
Alkaline Phosphatase: 134 U/L — ABNORMAL HIGH (ref 38–126)
Anion gap: 11 (ref 5–15)
BUN: 17 mg/dL (ref 8–23)
CO2: 23 mmol/L (ref 22–32)
Calcium: 8.9 mg/dL (ref 8.9–10.3)
Chloride: 106 mmol/L (ref 98–111)
Creatinine, Ser: 1.24 mg/dL (ref 0.61–1.24)
GFR, Estimated: 58 mL/min — ABNORMAL LOW (ref >60)
Glucose, Bld: 109 mg/dL — ABNORMAL HIGH (ref 70–99)
Potassium: 3.6 mmol/L (ref 3.5–5.1)
Sodium: 140 mmol/L (ref 135–145)
Total Bilirubin: 1.2 mg/dL (ref 0.0–1.2)
Total Protein: 6.5 g/dL (ref 6.5–8.1)

## 2023-12-25 MED ORDER — IOHEXOL 350 MG/ML SOLN
75.0000 mL | Freq: Once | INTRAVENOUS | Status: AC | PRN
  Administered 2023-12-25: 75 mL via INTRAVENOUS

## 2023-12-25 MED ORDER — DOXYCYCLINE HYCLATE 100 MG PO TABS
100.0000 mg | ORAL_TABLET | Freq: Once | ORAL | Status: AC
  Administered 2023-12-25: 100 mg via ORAL
  Filled 2023-12-25: qty 1

## 2023-12-25 MED ORDER — AMOXICILLIN-POT CLAVULANATE 875-125 MG PO TABS
1.0000 | ORAL_TABLET | Freq: Two times a day (BID) | ORAL | 0 refills | Status: AC
Start: 2023-12-25 — End: ?

## 2023-12-25 MED ORDER — DOXYCYCLINE HYCLATE 100 MG PO CAPS: 100.0000 mg | ORAL_CAPSULE | Freq: Two times a day (BID) | ORAL | 0 refills | Status: AC

## 2023-12-25 MED ORDER — AMOXICILLIN-POT CLAVULANATE 875-125 MG PO TABS
1.0000 | ORAL_TABLET | Freq: Once | ORAL | Status: AC
  Administered 2023-12-25: 1 via ORAL
  Filled 2023-12-25: qty 1

## 2023-12-25 NOTE — ED Provider Notes (Signed)
 [place] EMERGENCY DEPARTMENT AT [facility] Provider Note  CSN: [id number] Arrival date & time: 12/25/23 1449  Chief Complaint(s) Chest Pain  HPI Timothy Spencer is a 83 y.o. male with PMH CAD, COPD, gout, glaucoma, HTN, HLD, hypothyroidism, RLS who presents emerged part for evaluation of chest discomfort.  Patient states that symptoms began last night and have not resolved.  Does endorse intermittent coughing.  Denies fever, shortness of breath, abdominal pain, nausea, vomiting or other systemic symptoms.  Today in the ER, patient is having a very difficult time describing exactly what the pain feels like he just feels it is a discomfort mostly in the left side of the chest.   Past Medical History Past Medical History:  Diagnosis Date   Anxiety and depression    BPH (benign prostatic hyperplasia)    CAD (coronary artery disease)    COPD (chronic obstructive pulmonary disease) (HCC)    DOE (dyspnea on exertion)    ED (erectile dysfunction)    Emphysema of lung (HCC)    First degree AV block    GERD (gastroesophageal reflux disease)    Glaucoma    Gout    Gynecomastia    H/O ETOH abuse    Hemochromatosis    Hyperlipidemia    Hypertension    Hypothyroidism    Macular degeneration    OSA (obstructive sleep apnea)    Osteopenia    Peripheral neuropathy    RLS (restless legs syndrome)    Thrombocytopenia (HCC)    Patient Active Problem List   Diagnosis Date Noted   Bacterial infection due to streptococcus, group A 09/05/2023   BPH with indwelling suprapubic catheter 09/03/2023   Bacteremia due to Streptococcus 09/03/2023   Suprapubic catheter (HCC) 09/03/2023   Sepsis due to Steptococcus pyogenes (HCC) 09/02/2023   UTI (urinary tract infection) due to urinary indwelling catheter (HCC) 08/06/2022   Acute metabolic encephalopathy 08/03/2022   Protein-calorie malnutrition, severe 05/10/2022   Closed intertrochanteric fracture of left femur, initial encounter  (HCC) 05/09/2022   Thrombocytopenia (HCC) 05/09/2022   Chronic kidney disease, stage 3a (HCC) 05/09/2022   Anxiety and depression    Alpha-1-antitrypsin 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 04/11/2017   Lung nodule < 6cm on CT 03/06/2017   COPD (chronic obstructive pulmonary disease) (HCC) 08/17/2015   Personal history of tobacco use, presenting hazards to health 06/28/2015   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   Essential hypertension 06/18/2015   CAD in native artery    Exertional dyspnea 11/21/2014   Right bundle branch block 07/17/2014   First degree AV block 07/17/2014   CAD (coronary artery disease) 07/17/2014   Fatigue 03/18/2013   Hypothyroid 03/18/2013   Hyperlipidemia with target LDL less than 70 03/18/2013   Home Medication(s) Prior to Admission medications   Medication Sig Start Date End Date Taking? Authorizing Provider  amoxicillin -clavulanate (AUGMENTIN ) 875-125 MG tablet Take 1 tablet by mouth every 12 (twelve) hours. 12/25/23  Yes Elijah Phommachanh, MD  doxycycline  (VIBRAMYCIN ) 100 MG capsule Take 1 capsule (100 mg total) by mouth 2 (two) times daily for 7 days. 12/25/23 01/01/24 Yes Jawad Wiacek, MD  albuterol  (VENTOLIN  HFA) 108 (90 Base) MCG/ACT inhaler INHALE 2 PUFFS INTO THE LUNGS EVERY 6 HOURS AS NEEDED FOR WHEEZING OR SHORTNESS OF BREATH 02/19/23   Mannam, Praveen, MD  amLODipine  (NORVASC ) 10 MG tablet Take 10 mg by mouth daily.    [provider]  Budeson-Glycopyrrol-Formoterol  (BREZTRI  AEROSPHERE) 160-9-4.8 MCG/ACT AERO Inhale 2 puffs into the lungs in the morning and at  bedtime. 11/22/21   Mannam, Praveen, MD  buPROPion  (WELLBUTRIN  XL) 150 MG 24 hr tablet Take 150 mg by mouth every morning. 07/17/21   [provider]  diclofenac  Sodium (VOLTAREN ) 1 % GEL Apply 4 g topically 4 (four) times daily. 12/08/23   Emil Share, DO  feeding supplement (ENSURE ENLIVE / ENSURE PLUS) LIQD Take 237 mLs by mouth 2 (two) times daily between meals. 05/13/22   Arlice Reichert, MD   latanoprost  (XALATAN ) 0.005 % ophthalmic solution Place 1 drop into both eyes at bedtime. 08/17/21   [provider]  levothyroxine  (SYNTHROID ) 112 MCG tablet Take 112 mcg by mouth daily. 07/29/19   [provider]  rOPINIRole  (REQUIP ) 1 MG tablet Take 1 mg by mouth at bedtime. 01/01/13   [provider]  rosuvastatin  (CRESTOR ) 5 MG tablet Take 5 mg by mouth daily. 07/15/19   [provider]  senna-docusate (SENOKOT-S) 8.6-50 MG tablet Take 1 tablet by mouth at bedtime as needed for mild constipation. 05/13/22   Arlice Reichert, MD  sertraline  (ZOLOFT ) 100 MG tablet Take 200 mg by mouth daily. 01/25/13   [provider]  tamsulosin  (FLOMAX ) 0.4 MG CAPS capsule Take 0.4 mg by mouth in the morning and at bedtime. 01/31/13   [provider]  zolpidem (AMBIEN CR) 6.25 MG CR tablet Take 6.25 mg by mouth at bedtime as needed. 08/09/23   [provider]                                                                                                                                    Past Surgical History Past Surgical History:  Procedure Laterality Date   CARDIAC CATHETERIZATION  03/27/2008   Medical therapy   CARDIAC CATHETERIZATION  06/03/2010   Medical theray and smoking cessation   CARDIAC CATHETERIZATION N/A 11/24/2014   Procedure: Left Heart Cath and Coronary Angiography;  Surgeon: Debby DELENA Sor, MD;  Location: [facility] INVASIVE CV LAB;  Service: Cardiovascular;  Laterality: N/A;   CARDIOPULMONARY EXERCISE TEST  02/12/2012   Peak VO2 63% of predicted   FEMUR IM NAIL Left 05/10/2022   Procedure: INTRAMEDULLARY (IM) NAIL FEMORAL;  Surgeon: Edna Toribio DELENA, MD;  Location: [facility] ORS;  Service: Orthopedics;  Laterality: Left;   IR CATHETER TUBE CHANGE  04/03/2023   ORIF FEMUR FRACTURE Right    TOTAL HIP ARTHROPLASTY Left 08/05/2022   Procedure: TOTAL HIP ARTHROPLASTY;  Surgeon: Edna Toribio DELENA, MD;  Location: [facility] OR;  Service: Orthopedics;  Laterality:  Left;   TRANSTHORACIC ECHOCARDIOGRAM  11/28/2011   EF >55%, normal   Family History Family History  Adopted: Yes    Social History Social History   Tobacco Use   Smoking status: Former    Current packs/day: 0.00    Average packs/day: 2.0 packs/day for 50.9 years (101.9 ttl pk-yrs)    Types: Cigarettes    Start date: 04/11/1960    Quit date: 03/19/2011  Years since quitting: 12.7   Smokeless tobacco: Never   Tobacco comments:    Quit for 1 year total   Substance Use Topics   Alcohol use: No    Alcohol/week: 0.0 standard drinks of alcohol   Drug use: No   Allergies Hydrochlorothiazide w-spironolactone  Review of Systems Review of Systems  Cardiovascular:  Positive for chest pain.    Physical Exam Vital Signs  I have reviewed the triage vital signs BP (!) 158/113   Pulse 85   Temp 98.1 F (36.7 C)   Resp 16   Ht 5' 11 (1.803 m)   Wt 68 kg   SpO2 100%   BMI 20.92 kg/m   Physical Exam Constitutional:      General: He is not in acute distress.    Appearance: Normal appearance.  HENT:     Head: Normocephalic and atraumatic.     Nose: No congestion or rhinorrhea.  Eyes:     General:        Right eye: No discharge.        Left eye: No discharge.     Extraocular Movements: Extraocular movements intact.     Pupils: Pupils are equal, round, and reactive to light.  Cardiovascular:     Rate and Rhythm: Normal rate and regular rhythm.     Heart sounds: No murmur heard. Pulmonary:     Effort: No respiratory distress.     Breath sounds: No wheezing or rales.  Abdominal:     General: There is no distension.     Tenderness: There is no abdominal tenderness.  Musculoskeletal:        General: Normal range of motion.     Cervical back: Normal range of motion.  Skin:    General: Skin is warm and dry.  Neurological:     General: No focal deficit present.     Mental Status: He is alert.     ED Results and Treatments Labs (all labs ordered are listed, but  only abnormal results are displayed) Labs Reviewed  COMPREHENSIVE METABOLIC PANEL WITH GFR - Abnormal; Notable for the following components:      Result Value   Glucose, Bld 109 (*)    Alkaline Phosphatase 134 (*)    GFR, Estimated 58 (*)    All other components within normal limits  CBC WITH DIFFERENTIAL/PLATELET - Abnormal; Notable for the following components:   RBC 4.15 (*)    Hemoglobin 12.8 (*)    HCT 38.0 (*)    All other components within normal limits  TROPONIN I (HIGH SENSITIVITY)  TROPONIN I (HIGH SENSITIVITY)                                                                                                                          Radiology CT Chest W Contrast Result Date: 12/25/2023 CLINICAL DATA:  Pneumonia with complication suspected. EXAM: CT CHEST WITH CONTRAST TECHNIQUE: Multidetector CT imaging of the chest was performed during intravenous  contrast administration. RADIATION DOSE REDUCTION: This exam was performed according to the departmental dose-optimization program which includes automated exposure control, adjustment of the mA and/or kV according to patient size and/or use of iterative reconstruction technique. CONTRAST:  75mL OMNIPAQUE  IOHEXOL  350 MG/ML SOLN COMPARISON:  Chest radiograph 12/25/2023.  CT chest 09/07/2021 FINDINGS: Cardiovascular: Normal heart size. Trace pericardial effusion. Normal caliber thoracic aorta. No aneurysm or dissection. Great vessel origins are patent. Calcification in the aorta and coronary arteries. Mediastinum/Nodes: No enlarged mediastinal, hilar, or axillary lymph nodes. Thyroid  gland, trachea, and esophagus demonstrate no significant findings. Lungs/Pleura: Emphysematous changes in the lungs. Scattered subpleural fibrosis. Mild atelectasis or consolidation in the right lung base. This could be due to compressive atelectasis or pneumonia. Patchy airspace disease in the left costophrenic angle, possibly pneumonia. No pleural effusion or  pneumothorax. Upper Abdomen: Cholelithiasis without inflammatory stranding. Musculoskeletal: Degenerative changes in the spine and shoulders. Old rib fractures demonstrated bilaterally with subacute and healing fractures in the left lateral third, fourth, fifth, sixth, seventh, and eighth ribs. IMPRESSION: 1. Compressive atelectasis or consolidation in the right lung base. 2. Focal airspace disease in the left lung base, possibly pneumonia. 3. Emphysematous changes in the lungs with scattered subpleural fibrosis. 4. Mild cardiac enlargement with trace pericardial effusion. 5. Aortic atherosclerosis. 6. Cholelithiasis. Electronically Signed   By: Elsie Gravely M.D.   On: 12/25/2023 18:55   DG Chest 2 View Result Date: 12/25/2023 CLINICAL DATA:  Chest pain EXAM: CHEST - 2 VIEW COMPARISON:  Chest x-ray 12/08/2023 FINDINGS: Elevated right hemidiaphragm. No pneumothorax, effusion or edema. There is some linear opacity the bases likely scar or atelectasis. Normal cardiopericardial silhouette. Kyphotic x-ray obscures the apices. Artifact on lateral view from metal bars. Osteopenia degenerative changes. IMPRESSION: Limited x-rays. Elevated right hemidiaphragm. Basilar opacity seen, favoring atelectasis. Electronically Signed   By: Ranell Bring M.D.   On: 12/25/2023 16:56    Pertinent labs & imaging results that were available during my care of the patient were reviewed by me and considered in my medical decision making (see MDM for details).  Medications Ordered in ED Medications  iohexol  (OMNIPAQUE ) 350 MG/ML injection 75 mL (75 mLs Intravenous Contrast Given 12/25/23 1846)  amoxicillin -clavulanate (AUGMENTIN ) 875-125 MG per tablet 1 tablet (1 tablet Oral Given 12/25/23 1941)  doxycycline  (VIBRA -TABS) tablet 100 mg (100 mg Oral Given 12/25/23 1941)                                                                                                                                      Procedures Procedures  (including critical care time)  Medical Decision Making / ED Course   This patient presents to the ED for concern of chest pain, this involves an extensive number of treatment options, and is a complaint that carries with it a high risk of complications and morbidity.  The differential diagnosis includes ACS, Pneumothorax, Pneumonia, Esophageal Rupture, PE,l Effusion, pericarditis,  esophageal spasm, dysrhythmia, GERD, costochondritis.  MDM: Patient seen emerged part for evaluation of chest discomfort.  Physical exam largely unremarkable.  Laboratory evaluation with a hemoglobin of 12.8 but is otherwise unremarkable.  High-sensitivity troponin and delta troponin negative.  ECG nonischemic and unchanged from previous, limited by artifact.  Chest x-rays with a basilar opacity but are fairly limited.  Follow-up CT chest with compressive atelectasis or consolidation the right lung base as well as focal airspace disease in the left lung base possibly pneumonia.  Will cover with Augmentin  doxycycline .  Patient is not hypoxic here and has a curb 65 score of 1 placing him in a low risk category okay for outpatient biotics.  With negative troponins, no exertional component to the pain, no associated shortness of breath or diaphoresis I have very low suspicion for ACS.  Low risk by Wells criteria and have low suspicion for PE.  Thus at this time he does not meet inpatient criteria for admission and will be discharged with outpatient follow-up.   Additional history obtained: -Additional history obtained from wife -External records from outside source obtained and reviewed including: Chart review including previous notes, labs, imaging, consultation notes   Lab Tests: -I ordered, reviewed, and interpreted labs.   The pertinent results include:   Labs Reviewed  COMPREHENSIVE METABOLIC PANEL WITH GFR - Abnormal; Notable for the following components:      Result Value   Glucose, Bld  109 (*)    Alkaline Phosphatase 134 (*)    GFR, Estimated 58 (*)    All other components within normal limits  CBC WITH DIFFERENTIAL/PLATELET - Abnormal; Notable for the following components:   RBC 4.15 (*)    Hemoglobin 12.8 (*)    HCT 38.0 (*)    All other components within normal limits  TROPONIN I (HIGH SENSITIVITY)  TROPONIN I (HIGH SENSITIVITY)      EKG   EKG Interpretation Date/Time:  Tuesday December 25 2023 15:14:27 EDT Ventricular Rate:  86 PR Interval:  280 QRS Duration:  116 QT Interval:  384 QTC Calculation: 459 R Axis:   -71  Text Interpretation: Sinus rhythm with 1st degree A-V block Left axis deviation Incomplete right bundle branch block No significant change since last tracing When compared with ECG of 02-Sep-2023 16:04, PREVIOUS ECG IS PRESENT Confirmed by Jaylen Claude ([id number]) on 12/25/2023 10:51:11 PM         Imaging Studies ordered: I ordered imaging studies including chest x-ray, CT chest with contrast I independently visualized and interpreted imaging. I agree with the radiologist interpretation   Medicines ordered and prescription drug management: Meds ordered this encounter  Medications   iohexol  (OMNIPAQUE ) 350 MG/ML injection 75 mL   amoxicillin -clavulanate (AUGMENTIN ) 875-125 MG per tablet 1 tablet   doxycycline  (VIBRA -TABS) tablet 100 mg   amoxicillin -clavulanate (AUGMENTIN ) 875-125 MG tablet    Sig: Take 1 tablet by mouth every 12 (twelve) hours.    Dispense:  14 tablet    Refill:  0   doxycycline  (VIBRAMYCIN ) 100 MG capsule    Sig: Take 1 capsule (100 mg total) by mouth 2 (two) times daily for 7 days.    Dispense:  14 capsule    Refill:  0    -I have reviewed the patients home medicines and have made adjustments as needed  Critical interventions none    Cardiac Monitoring: The patient was maintained on a cardiac monitor.  I personally viewed and interpreted the cardiac monitored which showed an underlying rhythm of: NSR  Social  Determinants of Health:  Factors impacting patients care include: none   Reevaluation: After the interventions noted above, I reevaluated the patient and found that they have :improved  Co morbidities that complicate the patient evaluation  Past Medical History:  Diagnosis Date   Anxiety and depression    BPH (benign prostatic hyperplasia)    CAD (coronary artery disease)    COPD (chronic obstructive pulmonary disease) (HCC)    DOE (dyspnea on exertion)    ED (erectile dysfunction)    Emphysema of lung (HCC)    First degree AV block    GERD (gastroesophageal reflux disease)    Glaucoma    Gout    Gynecomastia    H/O ETOH abuse    Hemochromatosis    Hyperlipidemia    Hypertension    Hypothyroidism    Macular degeneration    OSA (obstructive sleep apnea)    Osteopenia    Peripheral neuropathy    RLS (restless legs syndrome)    Thrombocytopenia (HCC)       Dispostion: I considered admission for this patient, but at this time he does not meet inpatient criteria for admission and will be discharged with outpatient follow-up     Final Clinical Impression(s) / ED Diagnoses Final diagnoses:  Pneumonia of left lower lobe due to infectious organism     @PCDICTATION @    Albertina Dixon, MD 12/25/23 2253

## 2023-12-25 NOTE — ED Notes (Signed)
 CCMD called.

## 2023-12-25 NOTE — ED Triage Notes (Signed)
 Pt here for chest discomfort that started last night and has not gone away . Denies SHOB/n/v. Axox4. EMS gave 324mg  of aspirin .

## 2023-12-28 DIAGNOSIS — D696 Thrombocytopenia, unspecified: Secondary | ICD-10-CM | POA: Diagnosis not present

## 2023-12-28 DIAGNOSIS — I129 Hypertensive chronic kidney disease with stage 1 through stage 4 chronic kidney disease, or unspecified chronic kidney disease: Secondary | ICD-10-CM | POA: Diagnosis not present

## 2023-12-28 DIAGNOSIS — M545 Low back pain, unspecified: Secondary | ICD-10-CM | POA: Diagnosis not present

## 2023-12-28 DIAGNOSIS — K219 Gastro-esophageal reflux disease without esophagitis: Secondary | ICD-10-CM | POA: Diagnosis not present

## 2023-12-28 DIAGNOSIS — G629 Polyneuropathy, unspecified: Secondary | ICD-10-CM | POA: Diagnosis not present

## 2023-12-28 DIAGNOSIS — E039 Hypothyroidism, unspecified: Secondary | ICD-10-CM | POA: Diagnosis not present

## 2023-12-28 DIAGNOSIS — N1831 Chronic kidney disease, stage 3a: Secondary | ICD-10-CM | POA: Diagnosis not present

## 2023-12-28 DIAGNOSIS — H409 Unspecified glaucoma: Secondary | ICD-10-CM | POA: Diagnosis not present

## 2023-12-28 DIAGNOSIS — G4700 Insomnia, unspecified: Secondary | ICD-10-CM | POA: Diagnosis not present

## 2023-12-28 DIAGNOSIS — F419 Anxiety disorder, unspecified: Secondary | ICD-10-CM | POA: Diagnosis not present

## 2023-12-28 DIAGNOSIS — E7800 Pure hypercholesterolemia, unspecified: Secondary | ICD-10-CM | POA: Diagnosis not present

## 2023-12-28 DIAGNOSIS — M1030 Gout due to renal impairment, unspecified site: Secondary | ICD-10-CM | POA: Diagnosis not present

## 2023-12-28 DIAGNOSIS — K5790 Diverticulosis of intestine, part unspecified, without perforation or abscess without bleeding: Secondary | ICD-10-CM | POA: Diagnosis not present

## 2023-12-28 DIAGNOSIS — M810 Age-related osteoporosis without current pathological fracture: Secondary | ICD-10-CM | POA: Diagnosis not present

## 2023-12-28 DIAGNOSIS — I452 Bifascicular block: Secondary | ICD-10-CM | POA: Diagnosis not present

## 2023-12-28 DIAGNOSIS — I440 Atrioventricular block, first degree: Secondary | ICD-10-CM | POA: Diagnosis not present

## 2023-12-28 DIAGNOSIS — F32A Depression, unspecified: Secondary | ICD-10-CM | POA: Diagnosis not present

## 2023-12-28 DIAGNOSIS — N400 Enlarged prostate without lower urinary tract symptoms: Secondary | ICD-10-CM | POA: Diagnosis not present

## 2023-12-28 DIAGNOSIS — G8929 Other chronic pain: Secondary | ICD-10-CM | POA: Diagnosis not present

## 2023-12-28 DIAGNOSIS — G4733 Obstructive sleep apnea (adult) (pediatric): Secondary | ICD-10-CM | POA: Diagnosis not present

## 2023-12-28 DIAGNOSIS — G2581 Restless legs syndrome: Secondary | ICD-10-CM | POA: Diagnosis not present

## 2023-12-28 DIAGNOSIS — R1310 Dysphagia, unspecified: Secondary | ICD-10-CM | POA: Diagnosis not present

## 2023-12-28 DIAGNOSIS — I2510 Atherosclerotic heart disease of native coronary artery without angina pectoris: Secondary | ICD-10-CM | POA: Diagnosis not present

## 2023-12-28 DIAGNOSIS — J432 Centrilobular emphysema: Secondary | ICD-10-CM | POA: Diagnosis not present

## 2023-12-28 DIAGNOSIS — N529 Male erectile dysfunction, unspecified: Secondary | ICD-10-CM | POA: Diagnosis not present

## 2024-01-04 DIAGNOSIS — H409 Unspecified glaucoma: Secondary | ICD-10-CM | POA: Diagnosis not present

## 2024-01-04 DIAGNOSIS — I452 Bifascicular block: Secondary | ICD-10-CM | POA: Diagnosis not present

## 2024-01-04 DIAGNOSIS — N1831 Chronic kidney disease, stage 3a: Secondary | ICD-10-CM | POA: Diagnosis not present

## 2024-01-04 DIAGNOSIS — K5790 Diverticulosis of intestine, part unspecified, without perforation or abscess without bleeding: Secondary | ICD-10-CM | POA: Diagnosis not present

## 2024-01-04 DIAGNOSIS — I440 Atrioventricular block, first degree: Secondary | ICD-10-CM | POA: Diagnosis not present

## 2024-01-04 DIAGNOSIS — M545 Low back pain, unspecified: Secondary | ICD-10-CM | POA: Diagnosis not present

## 2024-01-04 DIAGNOSIS — N529 Male erectile dysfunction, unspecified: Secondary | ICD-10-CM | POA: Diagnosis not present

## 2024-01-04 DIAGNOSIS — J432 Centrilobular emphysema: Secondary | ICD-10-CM | POA: Diagnosis not present

## 2024-01-04 DIAGNOSIS — D696 Thrombocytopenia, unspecified: Secondary | ICD-10-CM | POA: Diagnosis not present

## 2024-01-04 DIAGNOSIS — G4700 Insomnia, unspecified: Secondary | ICD-10-CM | POA: Diagnosis not present

## 2024-01-04 DIAGNOSIS — F32A Depression, unspecified: Secondary | ICD-10-CM | POA: Diagnosis not present

## 2024-01-04 DIAGNOSIS — G629 Polyneuropathy, unspecified: Secondary | ICD-10-CM | POA: Diagnosis not present

## 2024-01-04 DIAGNOSIS — M810 Age-related osteoporosis without current pathological fracture: Secondary | ICD-10-CM | POA: Diagnosis not present

## 2024-01-04 DIAGNOSIS — M1030 Gout due to renal impairment, unspecified site: Secondary | ICD-10-CM | POA: Diagnosis not present

## 2024-01-04 DIAGNOSIS — R1310 Dysphagia, unspecified: Secondary | ICD-10-CM | POA: Diagnosis not present

## 2024-01-04 DIAGNOSIS — I2510 Atherosclerotic heart disease of native coronary artery without angina pectoris: Secondary | ICD-10-CM | POA: Diagnosis not present

## 2024-01-04 DIAGNOSIS — G2581 Restless legs syndrome: Secondary | ICD-10-CM | POA: Diagnosis not present

## 2024-01-04 DIAGNOSIS — K219 Gastro-esophageal reflux disease without esophagitis: Secondary | ICD-10-CM | POA: Diagnosis not present

## 2024-01-04 DIAGNOSIS — G4733 Obstructive sleep apnea (adult) (pediatric): Secondary | ICD-10-CM | POA: Diagnosis not present

## 2024-01-04 DIAGNOSIS — E7800 Pure hypercholesterolemia, unspecified: Secondary | ICD-10-CM | POA: Diagnosis not present

## 2024-01-04 DIAGNOSIS — N400 Enlarged prostate without lower urinary tract symptoms: Secondary | ICD-10-CM | POA: Diagnosis not present

## 2024-01-04 DIAGNOSIS — E039 Hypothyroidism, unspecified: Secondary | ICD-10-CM | POA: Diagnosis not present

## 2024-01-04 DIAGNOSIS — I129 Hypertensive chronic kidney disease with stage 1 through stage 4 chronic kidney disease, or unspecified chronic kidney disease: Secondary | ICD-10-CM | POA: Diagnosis not present

## 2024-01-04 DIAGNOSIS — F419 Anxiety disorder, unspecified: Secondary | ICD-10-CM | POA: Diagnosis not present

## 2024-01-04 DIAGNOSIS — G8929 Other chronic pain: Secondary | ICD-10-CM | POA: Diagnosis not present

## 2024-01-10 DIAGNOSIS — I129 Hypertensive chronic kidney disease with stage 1 through stage 4 chronic kidney disease, or unspecified chronic kidney disease: Secondary | ICD-10-CM | POA: Diagnosis not present

## 2024-01-10 DIAGNOSIS — M545 Low back pain, unspecified: Secondary | ICD-10-CM | POA: Diagnosis not present

## 2024-01-10 DIAGNOSIS — F419 Anxiety disorder, unspecified: Secondary | ICD-10-CM | POA: Diagnosis not present

## 2024-01-10 DIAGNOSIS — F32A Depression, unspecified: Secondary | ICD-10-CM | POA: Diagnosis not present

## 2024-01-10 DIAGNOSIS — I2510 Atherosclerotic heart disease of native coronary artery without angina pectoris: Secondary | ICD-10-CM | POA: Diagnosis not present

## 2024-01-10 DIAGNOSIS — G4700 Insomnia, unspecified: Secondary | ICD-10-CM | POA: Diagnosis not present

## 2024-01-10 DIAGNOSIS — H409 Unspecified glaucoma: Secondary | ICD-10-CM | POA: Diagnosis not present

## 2024-01-10 DIAGNOSIS — G8929 Other chronic pain: Secondary | ICD-10-CM | POA: Diagnosis not present

## 2024-01-10 DIAGNOSIS — E039 Hypothyroidism, unspecified: Secondary | ICD-10-CM | POA: Diagnosis not present

## 2024-01-10 DIAGNOSIS — I440 Atrioventricular block, first degree: Secondary | ICD-10-CM | POA: Diagnosis not present

## 2024-01-10 DIAGNOSIS — N1831 Chronic kidney disease, stage 3a: Secondary | ICD-10-CM | POA: Diagnosis not present

## 2024-01-10 DIAGNOSIS — G2581 Restless legs syndrome: Secondary | ICD-10-CM | POA: Diagnosis not present

## 2024-01-10 DIAGNOSIS — M1030 Gout due to renal impairment, unspecified site: Secondary | ICD-10-CM | POA: Diagnosis not present

## 2024-01-10 DIAGNOSIS — N400 Enlarged prostate without lower urinary tract symptoms: Secondary | ICD-10-CM | POA: Diagnosis not present

## 2024-01-10 DIAGNOSIS — R1310 Dysphagia, unspecified: Secondary | ICD-10-CM | POA: Diagnosis not present

## 2024-01-10 DIAGNOSIS — G4733 Obstructive sleep apnea (adult) (pediatric): Secondary | ICD-10-CM | POA: Diagnosis not present

## 2024-01-10 DIAGNOSIS — K5790 Diverticulosis of intestine, part unspecified, without perforation or abscess without bleeding: Secondary | ICD-10-CM | POA: Diagnosis not present

## 2024-01-10 DIAGNOSIS — I452 Bifascicular block: Secondary | ICD-10-CM | POA: Diagnosis not present

## 2024-01-10 DIAGNOSIS — D696 Thrombocytopenia, unspecified: Secondary | ICD-10-CM | POA: Diagnosis not present

## 2024-01-10 DIAGNOSIS — K219 Gastro-esophageal reflux disease without esophagitis: Secondary | ICD-10-CM | POA: Diagnosis not present

## 2024-01-10 DIAGNOSIS — M810 Age-related osteoporosis without current pathological fracture: Secondary | ICD-10-CM | POA: Diagnosis not present

## 2024-01-10 DIAGNOSIS — E7800 Pure hypercholesterolemia, unspecified: Secondary | ICD-10-CM | POA: Diagnosis not present

## 2024-01-10 DIAGNOSIS — G629 Polyneuropathy, unspecified: Secondary | ICD-10-CM | POA: Diagnosis not present

## 2024-01-10 DIAGNOSIS — N529 Male erectile dysfunction, unspecified: Secondary | ICD-10-CM | POA: Diagnosis not present

## 2024-01-10 DIAGNOSIS — J432 Centrilobular emphysema: Secondary | ICD-10-CM | POA: Diagnosis not present

## 2024-01-14 DIAGNOSIS — G8929 Other chronic pain: Secondary | ICD-10-CM | POA: Diagnosis not present

## 2024-01-14 DIAGNOSIS — G629 Polyneuropathy, unspecified: Secondary | ICD-10-CM | POA: Diagnosis not present

## 2024-01-14 DIAGNOSIS — E039 Hypothyroidism, unspecified: Secondary | ICD-10-CM | POA: Diagnosis not present

## 2024-01-14 DIAGNOSIS — E7800 Pure hypercholesterolemia, unspecified: Secondary | ICD-10-CM | POA: Diagnosis not present

## 2024-01-14 DIAGNOSIS — M1030 Gout due to renal impairment, unspecified site: Secondary | ICD-10-CM | POA: Diagnosis not present

## 2024-01-14 DIAGNOSIS — I440 Atrioventricular block, first degree: Secondary | ICD-10-CM | POA: Diagnosis not present

## 2024-01-14 DIAGNOSIS — H409 Unspecified glaucoma: Secondary | ICD-10-CM | POA: Diagnosis not present

## 2024-01-14 DIAGNOSIS — G4700 Insomnia, unspecified: Secondary | ICD-10-CM | POA: Diagnosis not present

## 2024-01-14 DIAGNOSIS — I129 Hypertensive chronic kidney disease with stage 1 through stage 4 chronic kidney disease, or unspecified chronic kidney disease: Secondary | ICD-10-CM | POA: Diagnosis not present

## 2024-01-14 DIAGNOSIS — J432 Centrilobular emphysema: Secondary | ICD-10-CM | POA: Diagnosis not present

## 2024-01-14 DIAGNOSIS — N1831 Chronic kidney disease, stage 3a: Secondary | ICD-10-CM | POA: Diagnosis not present

## 2024-01-14 DIAGNOSIS — G2581 Restless legs syndrome: Secondary | ICD-10-CM | POA: Diagnosis not present

## 2024-01-14 DIAGNOSIS — G4733 Obstructive sleep apnea (adult) (pediatric): Secondary | ICD-10-CM | POA: Diagnosis not present

## 2024-01-14 DIAGNOSIS — N400 Enlarged prostate without lower urinary tract symptoms: Secondary | ICD-10-CM | POA: Diagnosis not present

## 2024-01-14 DIAGNOSIS — I452 Bifascicular block: Secondary | ICD-10-CM | POA: Diagnosis not present

## 2024-01-14 DIAGNOSIS — K5790 Diverticulosis of intestine, part unspecified, without perforation or abscess without bleeding: Secondary | ICD-10-CM | POA: Diagnosis not present

## 2024-01-14 DIAGNOSIS — R1310 Dysphagia, unspecified: Secondary | ICD-10-CM | POA: Diagnosis not present

## 2024-01-14 DIAGNOSIS — M545 Low back pain, unspecified: Secondary | ICD-10-CM | POA: Diagnosis not present

## 2024-01-14 DIAGNOSIS — K219 Gastro-esophageal reflux disease without esophagitis: Secondary | ICD-10-CM | POA: Diagnosis not present

## 2024-01-14 DIAGNOSIS — N529 Male erectile dysfunction, unspecified: Secondary | ICD-10-CM | POA: Diagnosis not present

## 2024-01-14 DIAGNOSIS — I2510 Atherosclerotic heart disease of native coronary artery without angina pectoris: Secondary | ICD-10-CM | POA: Diagnosis not present

## 2024-01-14 DIAGNOSIS — M810 Age-related osteoporosis without current pathological fracture: Secondary | ICD-10-CM | POA: Diagnosis not present

## 2024-01-14 DIAGNOSIS — F419 Anxiety disorder, unspecified: Secondary | ICD-10-CM | POA: Diagnosis not present

## 2024-01-14 DIAGNOSIS — F32A Depression, unspecified: Secondary | ICD-10-CM | POA: Diagnosis not present

## 2024-01-14 DIAGNOSIS — D696 Thrombocytopenia, unspecified: Secondary | ICD-10-CM | POA: Diagnosis not present

## 2024-01-15 DIAGNOSIS — E039 Hypothyroidism, unspecified: Secondary | ICD-10-CM | POA: Diagnosis not present

## 2024-01-23 ENCOUNTER — Ambulatory Visit: Admitting: Pulmonary Disease

## 2024-01-29 DIAGNOSIS — R338 Other retention of urine: Secondary | ICD-10-CM | POA: Diagnosis not present

## 2024-03-06 DIAGNOSIS — R399 Unspecified symptoms and signs involving the genitourinary system: Secondary | ICD-10-CM | POA: Diagnosis not present

## 2024-03-17 DIAGNOSIS — E039 Hypothyroidism, unspecified: Secondary | ICD-10-CM | POA: Diagnosis not present

## 2024-03-17 DIAGNOSIS — E7800 Pure hypercholesterolemia, unspecified: Secondary | ICD-10-CM | POA: Diagnosis not present

## 2024-03-20 DIAGNOSIS — Z0000 Encounter for general adult medical examination without abnormal findings: Secondary | ICD-10-CM | POA: Diagnosis not present

## 2024-03-20 DIAGNOSIS — E039 Hypothyroidism, unspecified: Secondary | ICD-10-CM | POA: Diagnosis not present

## 2024-03-20 DIAGNOSIS — Z23 Encounter for immunization: Secondary | ICD-10-CM | POA: Diagnosis not present

## 2024-03-20 DIAGNOSIS — J432 Centrilobular emphysema: Secondary | ICD-10-CM | POA: Diagnosis not present

## 2024-03-20 DIAGNOSIS — I25119 Atherosclerotic heart disease of native coronary artery with unspecified angina pectoris: Secondary | ICD-10-CM | POA: Diagnosis not present

## 2024-03-20 DIAGNOSIS — N400 Enlarged prostate without lower urinary tract symptoms: Secondary | ICD-10-CM | POA: Diagnosis not present

## 2024-04-03 ENCOUNTER — Encounter: Payer: Self-pay | Admitting: Pulmonary Disease

## 2024-04-03 ENCOUNTER — Ambulatory Visit: Admitting: Pulmonary Disease

## 2024-04-03 VITALS — BP 142/69 | HR 83 | Ht 71.0 in | Wt 151.0 lb

## 2024-04-03 DIAGNOSIS — R911 Solitary pulmonary nodule: Secondary | ICD-10-CM | POA: Diagnosis not present

## 2024-04-03 DIAGNOSIS — R0600 Dyspnea, unspecified: Secondary | ICD-10-CM

## 2024-04-03 DIAGNOSIS — Z87891 Personal history of nicotine dependence: Secondary | ICD-10-CM

## 2024-04-03 DIAGNOSIS — J449 Chronic obstructive pulmonary disease, unspecified: Secondary | ICD-10-CM

## 2024-04-03 DIAGNOSIS — J439 Emphysema, unspecified: Secondary | ICD-10-CM

## 2024-04-03 NOTE — Patient Instructions (Signed)
  VISIT SUMMARY: Today, we discussed your breathing difficulties related to emphysema and how it affects your daily activities and exercise tolerance. We reviewed your current inhaler and nebulizer use and made some adjustments to your treatment plan.  YOUR PLAN: CHRONIC OBSTRUCTIVE PULMONARY DISEASE (COPD) WITH EMPHYSEMA: Your COPD with emphysema is significantly impacting your daily activities and exercise tolerance, even though your pulmonary function tests do not show significant obstruction. -Continue using your Breztri  inhaler twice daily. -Use your albuterol  inhaler as needed, but do not exceed 2-3 times daily to avoid side effects like palpitations. -Start using the Ohtuvayre  nebulizer treatment twice daily for symptomatic relief. -We have ordered a blood test to check your inflammatory markers, which will help us  decide if injectable therapy is needed. -You have been provided with paperwork for nebulizer treatment approval through the pharmacy.                                Contains text generated by [employer]bridge.

## 2024-04-03 NOTE — Progress Notes (Signed)
 Timothy EGNOR    [id number]    10/02/1940  Primary Care Physician:Pharr, Ryan, MD  Referring Physician: Clarice Ryan, MD [address] [place],  [place] [address]  Chief complaint: Follow-up for dyspnea, emphysema  HPI: 83 y.o.  with moderate COPD, hypertension, hypothyroidism, restless leg syndrome, coronary artery disease.  Seen at [facility] pulmonary for evaluation of dyspnea in 2017.  He had a cardiopulmonary exercise test done there which showed deconditioning.  Also has history of right bundle branch block, coronary artery disease and follows with cardiology on a regular basis. He has enrolled in an exercise program and works out 4 times a week.  Reports marked improvement in symptoms of dyspnea. Completed rehab program in 2020.  He has been referred to [facility] for evaluation of endobronchial lung volume reduction.   Received note from a few interventional pulmonary clinic consult by Dr. Signe Jacobsen dated 03/20/2022 They note that he has chronic right hemidiaphragm elevation causing some restrictive changes in his lungs so he does not have hyperinflation and would not benefit from lung volume reduction.  Over 100 pack year of smoking in Quit 2015  Interim history Discussed the use of AI scribe software for clinical note transcription with the patient, who gave verbal consent to proceed.  History of Present Illness Timothy Spencer is an 83 year old male with emphysema who presents with breathing difficulties. He is accompanied by his wife, [name].  Dyspnea and exercise tolerance - Emphysema significantly impacts daily activities - Breathing difficulties present - Exercise limitations noted; owns a treadmill but uses inconsistently over the past six months - Aims to use treadmill five times per week but has not met this goal - Previously participated in pulmonary rehabilitation but restricted from treadmill use due to history of  falls  Pulmonary function - Pulmonary function tests do not show significant obstruction  Inhaler and nebulizer use - Uses Breztri  inhaler twice daily - Uses albuterol  inhaler sparingly due to concerns about palpitations   Outpatient Encounter Medications as of 04/03/2024  Medication Sig   albuterol  (VENTOLIN  HFA) 108 (90 Base) MCG/ACT inhaler INHALE 2 PUFFS INTO THE LUNGS EVERY 6 HOURS AS NEEDED FOR WHEEZING OR SHORTNESS OF BREATH   amLODipine  (NORVASC ) 10 MG tablet Take 10 mg by mouth daily.   amoxicillin -clavulanate (AUGMENTIN ) 875-125 MG tablet Take 1 tablet by mouth every 12 (twelve) hours.   Budeson-Glycopyrrol-Formoterol  (BREZTRI  AEROSPHERE) 160-9-4.8 MCG/ACT AERO Inhale 2 puffs into the lungs in the morning and at bedtime.   buPROPion  (WELLBUTRIN  XL) 150 MG 24 hr tablet Take 150 mg by mouth every morning.   diclofenac  Sodium (VOLTAREN ) 1 % GEL Apply 4 g topically 4 (four) times daily.   feeding supplement (ENSURE ENLIVE / ENSURE PLUS) LIQD Take 237 mLs by mouth 2 (two) times daily between meals.   latanoprost  (XALATAN ) 0.005 % ophthalmic solution Place 1 drop into both eyes at bedtime.   levothyroxine  (SYNTHROID ) 112 MCG tablet Take 112 mcg by mouth daily.   rOPINIRole  (REQUIP ) 1 MG tablet Take 1 mg by mouth at bedtime.   rosuvastatin  (CRESTOR ) 5 MG tablet Take 5 mg by mouth daily.   senna-docusate (SENOKOT-S) 8.6-50 MG tablet Take 1 tablet by mouth at bedtime as needed for mild constipation.   sertraline  (ZOLOFT ) 100 MG tablet Take 200 mg by mouth daily.   tamsulosin  (FLOMAX ) 0.4 MG CAPS capsule Take 0.4 mg by mouth in the morning and at bedtime.  zolpidem (AMBIEN CR) 6.25 MG CR tablet Take 6.25 mg by mouth at bedtime as needed.   No facility-administered encounter medications on file as of 04/03/2024.   Vitals:   04/03/24 1625  BP: (!) 142/69  Pulse: 83  Height: 5' 11 (1.803 m)  Weight: 151 lb (68.5 kg)  SpO2: 95%  TempSrc: Oral  BMI (Calculated): 21.07    Physical  Exam GEN: No acute distress CV: Regular rate and rhythm no murmurs LUNGS: Clear to auscultation bilaterally normal respiratory effort SKIN JOINTS: Warm and dry no rash   Data Reviewed: PFTs  06/26/2018 FVC 2.93 [55%], FEV1 2.03 [60%], F/F 69, TLC 5.54 (10 4%), DLCO 16.26 (46%)  01/14/2021 FVC 2.77 (63%], FEV1 2.00 [63%], F/F 72, TLC 5.26 [90%], DLCO 14.78 [10%] Mild restriction, severe diffusion defect  11/22/2021 FVC 2.26 [52%], FEV1 1.67 [54%], F/F 74, TLC 5.39 [72%], DLCO 15.50 [60%] Mild restriction, moderate diffusion  04/11/17:  Walked 312 Meters / Baseline Sat 100% on RA / Nadir Sat 98% on RA   Imaging CT chest 10/11/16- 4 mm left lower lobe nodule.  Previously noted lower lobe opacities have resolved.  Centrilobular emphysema.  Pleural thickening versus small effusion on the left CT chest 07/13/16-6 mm left lower lobe nodule.  9 mm left lower lobe nodule.  4 mm left lower lobe nodule.   Questionable pleural thickening versus pleural effusion CT 10/12/2017- Emphysema, stable pulmonary nodules.  Pleural-parenchymal scarring at the right hemithorax. Chest x-ray 06/26/18-chronic right hemidiaphragm elevation with mild atelectasis. High-res CT 09/07/2021-irregular peripheral opacities and septal thickening, right hemidiaphragm elevation, emphysema. I have reviewed the images personally.  ESOPHAGRAM/BARIUM SWALLOW 09/10/15:  Small hiatal hernia with mild stricture at the gastroesophageal junction. Negative for reflux.    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 The patient did not demonstrate a  pulmonary limitation. Given the patient's anaerobic threshold > 40% with increased heart rate reserve, poor effort or deconditioning can account for the patient's symptoms. However, the patient did display a reduced oxygen pulse which is an indirect measure of stroke volume which may indicate early cardiovascular disease. Please note that the patient's baseline EKG displayed a RBBB limiting an evaluation of ST segment alterations during the procedure.  Echocardiogram 10/28/2021 LVEF 60 to 65%, grade 1 diastolic dysfunction.  Normal RV size and function  Assessment & Plan Emphysema No significant obstruction on PFTs Follow-up PFTs reviewed with actually improvement in lung volumes and diffusion capacity and his CT scan does not show significant changes compared to prior and no evidence of interstitial lung disease.  Echocardiogram as noted above is unremarkable Discussed pulmonary rehab but he has declined as he recently had a hip surgery and has a suprapubic catheter as well. Not a candidate for endobronchial wall pleural tube evaluation  Despite no significant obstruction on PFTs, symptoms are severe. Currently using Breztri  inhaler twice daily and albuterol  inhaler as needed. Discussed potential side effects of frequent albuterol  use, including palpitations and increased heart rate. Consideration of new nebulizer treatment (Ohtuvayre ) for symptomatic relief. Discussed potential for pulmonary rehabilitation, though previous attempts were limited by facility restrictions. Consideration of injectable biologic therapy if inflammatory markers are elevated, though self-administration may be challenging. -  Continue Breztri  inhaler twice daily. - Use albuterol  inhaler as needed, not exceeding 2-3 times daily. - Initiate Ohtuvayre  treatment twice daily. - Ordered blood test [CBC with differential, IgE] to evaluate inflammatory markers for potential injectable therapy. - Provided paperwork for nebulizer  treatment approval through pharmacy.   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 alpha-1 antitrypsin levels have remained stable  Left lower lobe pulmonary nodule. Has remained stable on follow-up scans.  He is past the age cutoff to continue low-dose screening CT Reviewed chest x-ray  Health maintenance 03/02/2019-influenza Patient states that he is up-to-date with his pneumonia vaccine at his primary care. He is also vaccinated against Covid  Plan/Recommendations: - Continue breztri , albuterol , DuoNebs - Start Ohtuvayre  - Check CBC with diff and IgE  I personally spent a total of 30 minutes in the care of the patient today including preparing to see the patient, getting/reviewing separately obtained history, performing a medically appropriate exam/evaluation, referring and communicating with other health care professionals, documenting clinical information in the EHR, independently interpreting results, and communicating results.   Lonna Coder MD [facility] 04/03/2024, 4:33 PM  CC: Clarice Nottingham, MD

## 2024-04-08 ENCOUNTER — Telehealth: Payer: Self-pay

## 2024-04-08 NOTE — Telephone Encounter (Signed)
 Received Ohtuvayre  new start paperwork. Completed form and faxed with clinicals and insurance card copy to [facility]   Phone#: [phone] Fax#: [phone]

## 2024-04-10 NOTE — Telephone Encounter (Signed)
 Received fax from [employer] requesting to confirm pt's first name and provider's NPI number. Pt's name on insurance cards is [name], faxed back explanation that pt's government name is Timothy Spencer but he is the same pt on the insurance card. Updated NPI and wrote it out clearly. Faxed all info back to [facility].  Patient ID: [id number]

## 2024-04-11 NOTE — Telephone Encounter (Signed)
 Received fax from [facility] that all information was received.

## 2024-04-14 NOTE — Telephone Encounter (Signed)
 Received fax from [employer] with summary of benefits. Referral form for Ohtuvayre  received. Rx will be triaged to [facility] (phone: [phone]). Once benefits investigation completed, pharmacy will reach out the patient to schedule shipment. If medication is unaffordable, patient will need to express financial hardship to be referred back to [facility] for patient assistance program pre-screening.   Patient ID: [id number] Pharmacy phone: [facility] (phone: [phone]) [facility] Phone#: [phone]

## 2024-04-15 NOTE — Telephone Encounter (Signed)
 Received fax from [facility] that prescription was received.

## 2024-04-22 NOTE — Telephone Encounter (Signed)
 [facility] unable to reach patient.   MyChart message not read.   Called patient - he has received calls from [facility] but did not answer.   Gave [facility] pharmacy phone number - [phone]. If needed, provided [facility] phone number [phone] if cost assistance screening is needed.

## 2024-05-01 DIAGNOSIS — E039 Hypothyroidism, unspecified: Secondary | ICD-10-CM | POA: Diagnosis not present

## 2024-05-04 ENCOUNTER — Emergency Department (HOSPITAL_COMMUNITY)
Admission: EM | Admit: 2024-05-04 | Discharge: 2024-05-04 | Disposition: A | Discharge status: Home / Self Care | Attending: Emergency Medicine | Admitting: Emergency Medicine

## 2024-05-04 ENCOUNTER — Other Ambulatory Visit: Payer: Self-pay

## 2024-05-04 ENCOUNTER — Encounter (HOSPITAL_COMMUNITY): Payer: Self-pay

## 2024-05-04 DIAGNOSIS — Y732 Prosthetic and other implants, materials and accessory gastroenterology and urology devices associated with adverse incidents: Secondary | ICD-10-CM | POA: Diagnosis not present

## 2024-05-04 DIAGNOSIS — T83091A Other mechanical complication of indwelling urethral catheter, initial encounter: Secondary | ICD-10-CM | POA: Diagnosis not present

## 2024-05-04 DIAGNOSIS — T83098A Other mechanical complication of other indwelling urethral catheter, initial encounter: Secondary | ICD-10-CM | POA: Diagnosis not present

## 2024-05-04 DIAGNOSIS — T839XXA Unspecified complication of genitourinary prosthetic device, implant and graft, initial encounter: Secondary | ICD-10-CM

## 2024-05-04 LAB — CBC WITH DIFFERENTIAL/PLATELET
Abs Immature Granulocytes: 0.04 K/uL (ref 0.00–0.07)
Basophils Absolute: 0 K/uL (ref 0.0–0.1)
Basophils Relative: 0 %
Eosinophils Absolute: 0.1 K/uL (ref 0.0–0.5)
Eosinophils Relative: 1 %
HCT: 43.1 % (ref 39.0–52.0)
Hemoglobin: 14.7 g/dL (ref 13.0–17.0)
Immature Granulocytes: 0 %
Lymphocytes Relative: 18 %
Lymphs Abs: 1.8 K/uL (ref 0.7–4.0)
MCH: 31.3 pg (ref 26.0–34.0)
MCHC: 34.1 g/dL (ref 30.0–36.0)
MCV: 91.7 fL (ref 80.0–100.0)
Monocytes Absolute: 0.6 K/uL (ref 0.1–1.0)
Monocytes Relative: 6 %
Neutro Abs: 7.4 K/uL (ref 1.7–7.7)
Neutrophils Relative %: 75 %
Platelets: 175 K/uL (ref 150–400)
RBC: 4.7 MIL/uL (ref 4.22–5.81)
RDW: 14.4 % (ref 11.5–15.5)
WBC: 10 K/uL (ref 4.0–10.5)
nRBC: 0 % (ref 0.0–0.2)

## 2024-05-04 LAB — COMPREHENSIVE METABOLIC PANEL WITH GFR
ALT: 10 U/L (ref 0–44)
AST: 23 U/L (ref 15–41)
Albumin: 4.2 g/dL (ref 3.5–5.0)
Alkaline Phosphatase: 122 U/L (ref 38–126)
Anion gap: 13 (ref 5–15)
BUN: 27 mg/dL — ABNORMAL HIGH (ref 8–23)
CO2: 23 mmol/L (ref 22–32)
Calcium: 9.6 mg/dL (ref 8.9–10.3)
Chloride: 107 mmol/L (ref 98–111)
Creatinine, Ser: 1.58 mg/dL — ABNORMAL HIGH (ref 0.61–1.24)
GFR, Estimated: 43 mL/min — ABNORMAL LOW (ref >60)
Glucose, Bld: 97 mg/dL (ref 70–99)
Potassium: 4.3 mmol/L (ref 3.5–5.1)
Sodium: 143 mmol/L (ref 135–145)
Total Bilirubin: 0.8 mg/dL (ref 0.0–1.2)
Total Protein: 7.4 g/dL (ref 6.5–8.1)

## 2024-05-04 MED ORDER — HYDROMORPHONE HCL 1 MG/ML IJ SOLN
0.5000 mg | Freq: Once | INTRAMUSCULAR | Status: AC
  Administered 2024-05-04: 0.5 mg via INTRAVENOUS
  Filled 2024-05-04: qty 1

## 2024-05-04 MED ORDER — CHLORHEXIDINE GLUCONATE CLOTH 2 % EX PADS
6.0000 | MEDICATED_PAD | Freq: Every day | CUTANEOUS | Status: DC
  Administered 2024-05-04: 6 via TOPICAL

## 2024-05-04 MED ORDER — HYDROMORPHONE HCL 1 MG/ML IJ SOLN
1.0000 mg | Freq: Once | INTRAMUSCULAR | Status: AC
  Administered 2024-05-04: 1 mg via INTRAVENOUS
  Filled 2024-05-04: qty 1

## 2024-05-04 NOTE — ED Provider Notes (Signed)
 [place] EMERGENCY DEPARTMENT AT [facility] Provider Note   CSN: [id number] Arrival date & time: 05/04/24  1010     Patient presents with: Urinary Retention   Timothy Spencer is a 83 y.o. male with a history of BPH, who presents to the ED with problems with his urinary catheter that began yesterday.  Patient states that over the last day he has been having pain at the catheter site and has not had any urine into his catheter bag in the last 24 hours. Patient states that he has had his suprapubic catheter for over a year and has it changed once a month-patient is followed by [facility].  Patient states no other urinary symptoms.  Patient denies any nausea, vomiting, diarrhea, fevers. Patient is in no acute distress.    HPI     Prior to Admission medications   Medication Sig Start Date End Date Taking? Authorizing Provider  albuterol  (VENTOLIN  HFA) 108 (90 Base) MCG/ACT inhaler INHALE 2 PUFFS INTO THE LUNGS EVERY 6 HOURS AS NEEDED FOR WHEEZING OR SHORTNESS OF BREATH 02/19/23   Mannam, Praveen, MD  amLODipine  (NORVASC ) 10 MG tablet Take 10 mg by mouth daily.    [provider]  amoxicillin -clavulanate (AUGMENTIN ) 875-125 MG tablet Take 1 tablet by mouth every 12 (twelve) hours. 12/25/23   Kommor, Madison, MD  Budeson-Glycopyrrol-Formoterol  (BREZTRI  AEROSPHERE) 160-9-4.8 MCG/ACT AERO Inhale 2 puffs into the lungs in the morning and at bedtime. 11/22/21   Mannam, Praveen, MD  buPROPion  (WELLBUTRIN  XL) 150 MG 24 hr tablet Take 150 mg by mouth every morning. 07/17/21   [provider]  diclofenac  Sodium (VOLTAREN ) 1 % GEL Apply 4 g topically 4 (four) times daily. 12/08/23   Emil Share, DO  feeding supplement (ENSURE ENLIVE / ENSURE PLUS) LIQD Take 237 mLs by mouth 2 (two) times daily between meals. 05/13/22   Dahal, Chapman, MD  latanoprost  (XALATAN ) 0.005 % ophthalmic solution Place 1 drop into both eyes at bedtime. 08/17/21   [provider]  levothyroxine   (SYNTHROID ) 112 MCG tablet Take 112 mcg by mouth daily. 07/29/19   [provider]  rOPINIRole  (REQUIP ) 1 MG tablet Take 1 mg by mouth at bedtime. 01/01/13   [provider]  rosuvastatin  (CRESTOR ) 5 MG tablet Take 5 mg by mouth daily. 07/15/19   [provider]  senna-docusate (SENOKOT-S) 8.6-50 MG tablet Take 1 tablet by mouth at bedtime as needed for mild constipation. 05/13/22   Arlice Chapman, MD  sertraline  (ZOLOFT ) 100 MG tablet Take 200 mg by mouth daily. 01/25/13   [provider]  tamsulosin  (FLOMAX ) 0.4 MG CAPS capsule Take 0.4 mg by mouth in the morning and at bedtime. 01/31/13   [provider]  zolpidem (AMBIEN CR) 6.25 MG CR tablet Take 6.25 mg by mouth at bedtime as needed. 08/09/23   [provider]    Allergies: Hydrochlorothiazide w-spironolactone    Review of Systems  Genitourinary:        Catheter complication    Updated Vital Signs BP (!) 150/72 (BP Location: Right Arm)   Pulse 85   Temp 98 F (36.7 C) (Oral)   Resp 20   SpO2 97%   Physical Exam Vitals and nursing note reviewed.  Constitutional:      General: He is not in acute distress.    Appearance: Normal appearance.  HENT:     Head: Normocephalic and atraumatic.  Eyes:     Extraocular Movements: Extraocular movements intact.  Conjunctiva/sclera: Conjunctivae normal.     Pupils: Pupils are equal, round, and reactive to light.  Cardiovascular:     Rate and Rhythm: Normal rate and regular rhythm.     Pulses: Normal pulses.  Pulmonary:     Effort: Pulmonary effort is normal. No respiratory distress.  Abdominal:     General: Abdomen is flat.     Palpations: Abdomen is soft.     Tenderness: There is abdominal tenderness in the suprapubic area.     Comments: Patient has a suprapubic catheter in place.  Area around catheter does not appear inflamed or infected.  Patient has mild tenderness to palpation suprapubic abdomen.  Musculoskeletal:        General:  Normal range of motion.     Cervical back: Normal range of motion.  Skin:    General: Skin is warm and dry.     Capillary Refill: Capillary refill takes less than 2 seconds.  Neurological:     General: No focal deficit present.     Mental Status: He is alert. Mental status is at baseline.  Psychiatric:        Mood and Affect: Mood normal.     (all labs ordered are listed, but only abnormal results are displayed) Labs Reviewed  COMPREHENSIVE METABOLIC PANEL WITH GFR - Abnormal; Notable for the following components:      Result Value   BUN 27 (*)    Creatinine, Ser 1.58 (*)    GFR, Estimated 43 (*)    All other components within normal limits  CBC WITH DIFFERENTIAL/PLATELET  CBC WITH DIFFERENTIAL/PLATELET    EKG: None  Radiology: No results found.   Procedures   Medications Ordered in the ED  Chlorhexidine  Gluconate Cloth 2 % PADS 6 each (6 each Topical Given 05/04/24 1630)  HYDROmorphone  (DILAUDID ) injection 0.5 mg (0.5 mg Intravenous Given 05/04/24 1522)  HYDROmorphone  (DILAUDID ) injection 0.5 mg (0.5 mg Intravenous Given 05/04/24 1637)  HYDROmorphone  (DILAUDID ) injection 1 mg (1 mg Intravenous Given 05/04/24 1724)                                   Medical Decision Making Amount and/or Complexity of Data Reviewed Labs: ordered. Decision-making details documented in ED Course.  Risk OTC drugs. Prescription drug management.   Patient presents to the ED for: Urinary catheter complications This involves an extensive number of treatment options Differential diagnosis includes: Catheter obstruction Catheter displaced  Additional history/records obtained and reviewed: Additional history obtained from  outside medical records External records from outside source obtained and reviewed including urology records regarding suprapubic catheter.  Clinical Course as of 05/04/24 2101  Sun May 04, 2024  1241 Temp: 98.1 F (36.7 C) Patient afebrile, vital signs stable,  patient in no acute distress [ML]  1615 Comprehensive metabolic panel with GFR(!) Mildly elevated creatinine from baseline. [ML]  1615 CBC with Differential/Platelet No acute findings [ML]  1615 Dilaudid  given for pain management [ML]  1630 Attempted removal of suprapubic cath with Dr. Randol without success - to consult urology for further  [ML]  1715 Spoke with Dr. Nieves with urology who stated that catheter could either be removed, or catheter could be kept in place and a temporary foley catheter could be placed to drain the bladder and urology with exchange of the suprapubic catheter in the morning at scheduled appointment. [ML]  1745 Patient given additional Dilaudid  dosing due to continued pain after  attempting to remove suprapubic catheter [ML]  1823 Patient tolerated foley well, bladder drained 300 cc [ML]    Clinical Course User Index [ML] Willma Duwaine CROME, PA    Data Reviewed / Actions Taken: Labs ordered/reviewed with my independent interpretation in ED course above. Key findings for the patient were reviewed with the attending physician, and ongoing clinical collaboration was maintained throughout the visit  Management / Treatments: See ED course above for medications, treatments administered, and clinical rationale.   Reevaluation of the patient after these medicines showed that the patient improved I have reviewed the patients home medicines and have made adjustments as needed  ED Course / Reassessments: Problem List: Urinary catheter complication 83 year old male presented for complications with his urinary catheter. Initial assessment included history, physical exam, and review of prior medical records. Patients physical exam was overall unremarkable besides some mild pain on palpation to suprapubic abdomen. Laboratory studies were reassuring.  Pain and symptoms were addressed during the visit. Vital signs were obtained and monitored, and the patient remained stable  throughout the stay.  Given difficulty removing suprapubic catheter and conversations with urology, temporary Foley was placed until patient can be seen by urology in the morning for further evaluation and care. Serial reassessments performed: Yes    Consultations:  Urology - Dr. Nieves Consult recommendations incorporated into plan: Temporary Foley to be placed until patient is able to see urology at his scheduled appointment tomorrow.  Disposition: Disposition: Discharge with close follow-up with urology as scheduled appointment tomorrow for further evaluation and care of suprapubic catheter Rationale for disposition: Stable for discharge The disposition plan and rationale were discussed with the patient at the bedside, all questions were addressed, and the patient demonstrated understanding.  This note was produced using Electronics Engineer. While I have reviewed and verified all clinical information, transcription errors may remain.      Final diagnoses:  Urinary catheter complication, initial encounter    ED Discharge Orders     None          Willma Duwaine CROME, [place] 05/04/24 2104    Randol Simmonds, MD 05/04/24 2202

## 2024-05-04 NOTE — ED Triage Notes (Signed)
 Pt came in for bladder complications that started yesterday. Pt stated he has not urinated in a day and feels like his bladder is full. Pt has a catheter and patient noticed no urine has been in his bag.

## 2024-05-04 NOTE — ED Provider Notes (Signed)
 I provided a substantive portion of the care of this patient.  I personally made/approved the management plan for this patient and take responsibility for the patient management.    Attempted to be exchanged patient's suprapubic catheter.  Wound was deflated completely.  Was met with significant resistance that was causing the patient pain and discomfort.  Symptoms for several minutes.  Stopped procedure to discuss with urology.   Randol Simmonds, MD 05/04/24 [phone]

## 2024-05-04 NOTE — Discharge Instructions (Signed)
 Thank you for visiting the Emergency Department today. It was a pleasure to be part of your healthcare team.  As discussed, please follow-up at your urology appointment in the morning.  You have a temporary catheter placed until you are able to be seen by urology. It is important to watch for warning signs such as worsening pain or fever. If any of these happen, return to the Emergency Department or call 911. Thank you for trusting us  with your health.

## 2024-05-05 ENCOUNTER — Encounter (HOSPITAL_COMMUNITY): Payer: Self-pay

## 2024-05-05 ENCOUNTER — Emergency Department (HOSPITAL_COMMUNITY)

## 2024-05-05 ENCOUNTER — Emergency Department (HOSPITAL_COMMUNITY)
Admission: EM | Admit: 2024-05-05 | Discharge: 2024-05-05 | Disposition: A | Discharge status: Home / Self Care | Attending: Emergency Medicine | Admitting: Emergency Medicine

## 2024-05-05 ENCOUNTER — Other Ambulatory Visit: Payer: Self-pay | Admitting: Student

## 2024-05-05 DIAGNOSIS — S41112A Laceration without foreign body of left upper arm, initial encounter: Secondary | ICD-10-CM | POA: Diagnosis not present

## 2024-05-05 DIAGNOSIS — S0101XA Laceration without foreign body of scalp, initial encounter: Secondary | ICD-10-CM

## 2024-05-05 DIAGNOSIS — Z9359 Other cystostomy status: Secondary | ICD-10-CM

## 2024-05-05 DIAGNOSIS — W19XXXA Unspecified fall, initial encounter: Secondary | ICD-10-CM | POA: Diagnosis not present

## 2024-05-05 DIAGNOSIS — N400 Enlarged prostate without lower urinary tract symptoms: Secondary | ICD-10-CM

## 2024-05-05 DIAGNOSIS — T83098A Other mechanical complication of other indwelling urethral catheter, initial encounter: Secondary | ICD-10-CM | POA: Diagnosis not present

## 2024-05-05 DIAGNOSIS — S0990XA Unspecified injury of head, initial encounter: Secondary | ICD-10-CM | POA: Diagnosis not present

## 2024-05-05 DIAGNOSIS — R58 Hemorrhage, not elsewhere classified: Secondary | ICD-10-CM | POA: Diagnosis not present

## 2024-05-05 DIAGNOSIS — M4312 Spondylolisthesis, cervical region: Secondary | ICD-10-CM | POA: Diagnosis not present

## 2024-05-05 DIAGNOSIS — S199XXA Unspecified injury of neck, initial encounter: Secondary | ICD-10-CM | POA: Diagnosis not present

## 2024-05-05 DIAGNOSIS — Z9351 Cutaneous-vesicostomy status: Secondary | ICD-10-CM | POA: Diagnosis not present

## 2024-05-05 DIAGNOSIS — I6782 Cerebral ischemia: Secondary | ICD-10-CM | POA: Diagnosis not present

## 2024-05-05 DIAGNOSIS — W0110XA Fall on same level from slipping, tripping and stumbling with subsequent striking against unspecified object, initial encounter: Secondary | ICD-10-CM | POA: Diagnosis not present

## 2024-05-05 DIAGNOSIS — Y732 Prosthetic and other implants, materials and accessory gastroenterology and urology devices associated with adverse incidents: Secondary | ICD-10-CM | POA: Diagnosis not present

## 2024-05-05 DIAGNOSIS — M47812 Spondylosis without myelopathy or radiculopathy, cervical region: Secondary | ICD-10-CM | POA: Diagnosis not present

## 2024-05-05 DIAGNOSIS — N319 Neuromuscular dysfunction of bladder, unspecified: Secondary | ICD-10-CM

## 2024-05-05 DIAGNOSIS — J439 Emphysema, unspecified: Secondary | ICD-10-CM | POA: Diagnosis not present

## 2024-05-05 DIAGNOSIS — I10 Essential (primary) hypertension: Secondary | ICD-10-CM | POA: Diagnosis not present

## 2024-05-05 MED ORDER — LIDOCAINE HCL 2 % IJ SOLN
INTRAMUSCULAR | Status: AC
  Filled 2024-05-05: qty 20

## 2024-05-05 NOTE — ED Triage Notes (Addendum)
 Pt BIB [facility] from home for a trip and fall. Pt hit head, lac to top of scalp, no blood thinners, no LOC. Bleeding controlled. No pain reported besides head. All VSS per [facility].

## 2024-05-05 NOTE — ED Notes (Signed)
 Patient transported to CT

## 2024-05-05 NOTE — ED Provider Notes (Signed)
 [facility] EMERGENCY DEPARTMENT AT [facility] Provider Note   CSN: [id number] Arrival date & time: 05/05/24  9164     Patient presents with: Timothy Spencer is a 83 y.o. male.   Patient reports he tripped and fell and struck the back of his head.  Patient states that he did not lose consciousness.  He complains of a laceration to his head.  Patient states that he tripped.  Patient denies passing out.  He has not had any chest pain.  Patient reports that he also has an appointment today to have a suprapubic catheter put in.  He was seen yesterday at [facility] because suprapubic catheter was blocked.  Catheter was removed and a new catheter could not be replaced.  Patient states he was supposed to go to the urology office today.  The history is provided by the patient. No language interpreter was used.  Fall       Prior to Admission medications   Medication Sig Start Date End Date Taking? Authorizing Provider  albuterol  (VENTOLIN  HFA) 108 (90 Base) MCG/ACT inhaler INHALE 2 PUFFS INTO THE LUNGS EVERY 6 HOURS AS NEEDED FOR WHEEZING OR SHORTNESS OF BREATH 02/19/23   Mannam, Praveen, MD  amLODipine  (NORVASC ) 10 MG tablet Take 10 mg by mouth daily.    [provider]  amoxicillin -clavulanate (AUGMENTIN ) 875-125 MG tablet Take 1 tablet by mouth every 12 (twelve) hours. 12/25/23   Kommor, Madison, MD  Budeson-Glycopyrrol-Formoterol  (BREZTRI  AEROSPHERE) 160-9-4.8 MCG/ACT AERO Inhale 2 puffs into the lungs in the morning and at bedtime. 11/22/21   Mannam, Praveen, MD  buPROPion  (WELLBUTRIN  XL) 150 MG 24 hr tablet Take 150 mg by mouth every morning. 07/17/21   [provider]  diclofenac  Sodium (VOLTAREN ) 1 % GEL Apply 4 g topically 4 (four) times daily. 12/08/23   Emil Share, DO  feeding supplement (ENSURE ENLIVE / ENSURE PLUS) LIQD Take 237 mLs by mouth 2 (two) times daily between meals. 05/13/22   Arlice Reichert, MD  latanoprost  (XALATAN ) 0.005 % ophthalmic  solution Place 1 drop into both eyes at bedtime. 08/17/21   [provider]  levothyroxine  (SYNTHROID ) 112 MCG tablet Take 112 mcg by mouth daily. 07/29/19   [provider]  rOPINIRole  (REQUIP ) 1 MG tablet Take 1 mg by mouth at bedtime. 01/01/13   [provider]  rosuvastatin  (CRESTOR ) 5 MG tablet Take 5 mg by mouth daily. 07/15/19   [provider]  senna-docusate (SENOKOT-S) 8.6-50 MG tablet Take 1 tablet by mouth at bedtime as needed for mild constipation. 05/13/22   Arlice Reichert, MD  sertraline  (ZOLOFT ) 100 MG tablet Take 200 mg by mouth daily. 01/25/13   [provider]  tamsulosin  (FLOMAX ) 0.4 MG CAPS capsule Take 0.4 mg by mouth in the morning and at bedtime. 01/31/13   [provider]  zolpidem (AMBIEN CR) 6.25 MG CR tablet Take 6.25 mg by mouth at bedtime as needed. 08/09/23   [provider]    Allergies: Hydrochlorothiazide w-spironolactone    Review of Systems  All other systems reviewed and are negative.   Updated Vital Signs BP (!) 124/55 (BP Location: Left Arm)   Pulse 88   Temp 98 F (36.7 C)   Resp 17   SpO2 100%   Physical Exam Vitals and nursing note reviewed.  Constitutional:      Appearance: He is well-developed.  HENT:     Head: Normocephalic.  Cardiovascular:     Rate and  Rhythm: Normal rate.  Pulmonary:     Effort: Pulmonary effort is normal.  Abdominal:     General: There is no distension.     Comments: Suprapubic ostomy site, no sign of infection   Genitourinary:    Penis: Normal.      Comments: Foley innpenis,  Musculoskeletal:        General: Normal range of motion.     Cervical back: Normal range of motion.  Skin:    General: Skin is warm.  Neurological:     General: No focal deficit present.     Mental Status: He is alert and oriented to person, place, and time.  Psychiatric:        Mood and Affect: Mood normal.     (all labs ordered are listed, but only abnormal results are  displayed) Labs Reviewed - No data to display  EKG: None  Radiology: CT Head Wo Contrast Result Date: 05/05/2024 EXAM: CT HEAD WITHOUT CONTRAST 05/05/2024 10:07:01 AM TECHNIQUE: CT of the head was performed without the administration of intravenous contrast. Automated exposure control, iterative reconstruction, and/or weight based adjustment of the mA/kV was utilized to reduce the radiation dose to as low as reasonably achievable. COMPARISON: CT of the head dated 01/29/2022. CLINICAL HISTORY: Head trauma, moderate-severe. FINDINGS: BRAIN AND VENTRICLES: No acute hemorrhage. No evidence of acute infarct. No hydrocephalus. No extra-axial collection. No mass effect or midline shift. Generalized cerebral atrophy and mild chronic small vessel ischemic disease. Intracranial arterial calcification. ORBITS: No acute abnormality. SINUSES: No acute abnormality. SOFT TISSUES AND SKULL: Left vertex scalp laceration with staples in place. No skull fracture. IMPRESSION: 1. No acute intracranial abnormality. 2. Generalized cerebral atrophy and mild chronic small vessel ischemic disease. 3. Left vertex scalp laceration with staples in place. Electronically signed by: Evalene Coho MD 05/05/2024 10:38 AM EST RP Workstation: [id number]   CT Cervical Spine Wo Contrast Result Date: 05/05/2024 CLINICAL DATA:  Neck trauma. Patient tripped and fell striking head. Scalp laceration. EXAM: CT CERVICAL SPINE WITHOUT CONTRAST TECHNIQUE: Multidetector CT imaging of the cervical spine was performed without intravenous contrast. Multiplanar CT image reconstructions were also generated. RADIATION DOSE REDUCTION: This exam was performed according to the departmental dose-optimization program which includes automated exposure control, adjustment of the mA and/or kV according to patient size and/or use of iterative reconstruction technique. COMPARISON:  None Available. FINDINGS: Alignment: Minimal degenerative anterolisthesis at C3-4  and C4-5. No focal angulation. Skull base and vertebrae: No evidence of acute cervical spine fracture or traumatic subluxation. Soft tissues and spinal canal: No prevertebral fluid or swelling. No visible canal hematoma. Disc levels: Relatively mild multilevel spondylosis with disc bulging, uncinate spurring and facet hypertrophy. No large disc herniation or high-grade spinal stenosis demonstrated. Mild multilevel osseous foraminal narrowing. Upper chest: Emphysematous changes and mild scarring at both lung apices. No acute findings. Other: Bilateral carotid atherosclerosis. IMPRESSION: 1. No evidence of acute cervical spine fracture, traumatic subluxation or static signs of instability. 2. Mild cervical spondylosis. Electronically Signed   By: Elsie Perone M.D.   On: 05/05/2024 10:22     .Laceration Repair  Date/Time: 05/05/2024 3:38 PM  Performed by: Flint Sonny POUR, PA-C Authorized by: Flint Sonny POUR, PA-C   Consent:    Consent obtained:  Verbal   Consent given by:  Patient   Risks, benefits, and alternatives were discussed: yes     Risks discussed:  Infection   Alternatives discussed:  No treatment Universal protocol:    Procedure explained and questions  answered to patient or proxy's satisfaction: no     Immediately prior to procedure, a time out was called: no     Patient identity confirmed:  Verbally with patient Anesthesia:    Anesthesia method:  Local infiltration   Local anesthetic:  Lidocaine  1% w/o epi Laceration details:    Location:  Scalp   Length (cm):  10 Pre-procedure details:    Preparation:  Patient was prepped and draped in usual sterile fashion Exploration:    Limited defect created (wound extended): no     Wound exploration: entire depth of wound visualized     Contaminated: no   Treatment:    Area cleansed with:  Povidone-iodine    Amount of cleaning:  Extensive   Irrigation solution:  Sterile saline Skin repair:    Repair method:  Staples   Number of  staples:  14 Approximation:    Approximation:  Loose Repair type:    Repair type:  Complex Post-procedure details:    Procedure completion:  Tolerated well, no immediate complications    Medications Ordered in the ED  lidocaine  (XYLOCAINE ) 2 % (with pres) injection (  Given by Other 05/05/24 0855)                                    Medical Decision Making Patient complains of a laceration to the back of his head.  Patient also needs a suprapubic catheter placed.  Amount and/or Complexity of Data Reviewed Radiology: ordered and independent interpretation performed. Decision-making details documented in ED Course.    Details: Ct head and Ct cervical spine  no acute findings.   Discussion of management or test interpretation with external provider(s): I spoke to [facility] who saw pt here and placed a foley in suprapubic osteomy.  He scheduled pt for IR to evaluate pt and dilate.        Final diagnoses:  Laceration of skin of scalp, initial encounter  Skin tear of left upper arm without complication, initial encounter    ED Discharge Orders     None      An After Visit Summary was printed and given to the patient.     Flint Sonny POUR, PA-C 05/05/24 1539    Levander Houston, MD 05/05/24 [phone]

## 2024-05-05 NOTE — Procedures (Cosign Needed Addendum)
   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 Saw in [facility] ED today after a fall to evaluate SPT site.   [name] was in [facility] emergency department resting comfortably in bed on my arrival.  He fell and struck his head, requiring multiple staples.  68f Foley catheter was in place draining dark brown clear urine into leg bag.  SPT site was prepped and draped in the usual sterile fashion.  I gently probed his SPT track with the sensor wire introducer and easily located it.  Sensor wire was advanced into the bladder without difficulty.  I offloaded the introducer and attempted to advance a 59f council tip catheter over wire using Seldinger technique.  This would not advance and patient expressed discomfort.  I removed this catheter and fashioned a council tip catheter from a 49f latex catheter.  I was able to advance this into the bladder with a small amount of effort and minor discomfort to the patient.  Prior to inflating the balloon I flushed the suprapubic catheter.  I then disconnected the Foley catheter and flushed through the Foley, confirming drainage through the SP tube.  I then inflated the SP tube retention balloon with 10 mL of sterile water .  I then deflated the 10 mL retention balloon on his Foley catheter removed this from his urethra.  Finally I confirmed placement of the SP tube once more with catheter flushing quantitatively and qualitatively.  This was then connected to his existing leg bag, concluding the procedure.  Patient will need to schedule with interventional radiology for SPT upsizing.   Was able to contact interventional radiology to schedule him for upsizing of SPT this week, preferably to at least 70f.  Patient will present to [facility] Main entrance at 9 AM on 12/10.  Per IR, he will need to be n.p.o. the night before but may have morning meds  with sips, any blood thinners or diabetic medications will need to be held, he will need a driver, then he will need 24-hour supervision postoperatively.  Follow-up with [facility] for SPT exchange 30 days after upsizing.   Timothy Bourdon, NP [facility] Pager: [phone]

## 2024-05-05 NOTE — Discharge Instructions (Addendum)
 Present to [facility] Main entrance at 9 AM on 12/10.  Per IR, he will need to be n.p.o. the night before but may have morning meds with sips, any blood thinners or diabetic medications will need to be held, he will need a driver, then he will need 24-hour supervision postoperatively.  Follow-up with [facility] for SPT exchange 30 days after upsizing.  Staple removal in 8 days

## 2024-05-06 ENCOUNTER — Other Ambulatory Visit (HOSPITAL_COMMUNITY): Payer: Self-pay | Admitting: Radiology

## 2024-05-06 DIAGNOSIS — N390 Urinary tract infection, site not specified: Secondary | ICD-10-CM

## 2024-05-07 ENCOUNTER — Other Ambulatory Visit: Payer: Self-pay | Admitting: Student

## 2024-05-07 ENCOUNTER — Emergency Department (HOSPITAL_COMMUNITY): Admit: 2024-05-07

## 2024-05-07 ENCOUNTER — Inpatient Hospital Stay (HOSPITAL_COMMUNITY): Admission: RE | Admit: 2024-05-07 | Source: Ambulatory Visit

## 2024-05-07 DIAGNOSIS — N400 Enlarged prostate without lower urinary tract symptoms: Secondary | ICD-10-CM

## 2024-05-26 ENCOUNTER — Other Ambulatory Visit: Payer: Self-pay

## 2024-05-26 DIAGNOSIS — Z01818 Encounter for other preprocedural examination: Secondary | ICD-10-CM

## 2024-05-27 ENCOUNTER — Ambulatory Visit (HOSPITAL_COMMUNITY)
Admission: RE | Admit: 2024-05-27 | Discharge: 2024-05-27 | Disposition: A | Discharge status: Home / Self Care | Source: Ambulatory Visit | Attending: Student | Admitting: Student

## 2024-05-27 ENCOUNTER — Other Ambulatory Visit: Payer: Self-pay | Admitting: Student

## 2024-05-27 ENCOUNTER — Other Ambulatory Visit: Payer: Self-pay

## 2024-05-27 VITALS — BP 144/71 | HR 68 | Temp 97.5°F | Resp 11

## 2024-05-27 DIAGNOSIS — Z87891 Personal history of nicotine dependence: Secondary | ICD-10-CM | POA: Insufficient documentation

## 2024-05-27 DIAGNOSIS — N400 Enlarged prostate without lower urinary tract symptoms: Secondary | ICD-10-CM | POA: Insufficient documentation

## 2024-05-27 DIAGNOSIS — Z01818 Encounter for other preprocedural examination: Secondary | ICD-10-CM

## 2024-05-27 HISTORY — PX: IR CYSTOSTOMY TUBE PLACEMENT/BLADDER ASPIRATION: IMG1097

## 2024-05-27 LAB — PROTIME-INR
INR: 1.1 (ref 0.8–1.2)
Prothrombin Time: 14.9 s (ref 11.4–15.2)

## 2024-05-27 MED ORDER — MIDAZOLAM HCL 2 MG/2ML IJ SOLN
INTRAMUSCULAR | Status: AC
  Filled 2024-05-27: qty 4

## 2024-05-27 MED ORDER — FENTANYL CITRATE (PF) 100 MCG/2ML IJ SOLN
INTRAMUSCULAR | Status: AC | PRN
  Administered 2024-05-27: 50 ug via INTRAVENOUS

## 2024-05-27 MED ORDER — MIDAZOLAM HCL (PF) 2 MG/2ML IJ SOLN
INTRAMUSCULAR | Status: AC | PRN
  Administered 2024-05-27: 1 mg via INTRAVENOUS

## 2024-05-27 MED ORDER — FENTANYL CITRATE (PF) 100 MCG/2ML IJ SOLN
INTRAMUSCULAR | Status: AC
  Filled 2024-05-27: qty 4

## 2024-05-27 MED ORDER — LIDOCAINE HCL 1 % IJ SOLN
20.0000 mL | Freq: Once | INTRAMUSCULAR | Status: AC
  Administered 2024-05-27: 10 mL via INTRADERMAL

## 2024-05-27 MED ORDER — SODIUM CHLORIDE 0.9 % IV SOLN: INTRAVENOUS | Status: DC

## 2024-05-27 MED ORDER — LIDOCAINE VISCOUS HCL 2 % MT SOLN
OROMUCOSAL | Status: AC
  Filled 2024-05-27: qty 15

## 2024-05-27 MED ORDER — LIDOCAINE HCL 1 % IJ SOLN
INTRAMUSCULAR | Status: AC
  Filled 2024-05-27: qty 20

## 2024-05-27 MED ORDER — IOHEXOL 300 MG/ML  SOLN
50.0000 mL | Freq: Once | INTRAMUSCULAR | Status: AC | PRN
  Administered 2024-05-27: 10 mL

## 2024-05-27 NOTE — H&P (Signed)
 "   Chief Complaint: suprapubic catheter tube exchange and upsize  Referring Provider(s): Delia Smalls, NP  Supervising Physician: Philip Cornet  Patient Status: [facility] - Out-pt  History of Present Illness: Timothy Spencer is a 83 y.o. male with medical history significant for BPH, GERD, HTN, and HLD. He was seen at [facility] on 12/8 for blocked suprapubic catheter. He went to the emergency department for a fall and hit his head without loss of consciousness, which required multiple staples. Catheter was removed and a 14 Fr foley catheter was placed by urology. Received request for image guided suprapubic catheter tube exchange and upsize.  Confirms NPO since midnight and ride/supervision available for 24 hours. He does not wear CPAP or use supplemental home O2.  Denies fever, chills, shortness breath, chest pain, sore throat, nausea, vomiting, diarrhea, abdominal pain, blood in stool or urine, abnormal bruising, leg swelling, back pain, changes in urine output from suprapubic catheter in amount or odor, leaking at site from suprapubic catheter.  Reports feeling well overall.  Allergies Reviewed:  Hydrochlorothiazide w-spironolactone   Patient is Full Code  Past Medical History:  Diagnosis Date   Anxiety and depression    BPH (benign prostatic hyperplasia)    CAD (coronary artery disease)    COPD (chronic obstructive pulmonary disease) (HCC)    DOE (dyspnea on exertion)    ED (erectile dysfunction)    Emphysema of lung (HCC)    First degree AV block    GERD (gastroesophageal reflux disease)    Glaucoma    Gout    Gynecomastia    H/O ETOH abuse    Hemochromatosis    Hyperlipidemia    Hypertension    Hypothyroidism    Macular degeneration    OSA (obstructive sleep apnea)    Osteopenia    Peripheral neuropathy    RLS (restless legs syndrome)    Thrombocytopenia     Past Surgical History:  Procedure Laterality Date   CARDIAC CATHETERIZATION  03/27/2008   Medical  therapy   CARDIAC CATHETERIZATION  06/03/2010   Medical theray and smoking cessation   CARDIAC CATHETERIZATION N/A 11/24/2014   Procedure: Left Heart Cath and Coronary Angiography;  Surgeon: Debby DELENA Sor, MD;  Location: [facility] INVASIVE CV LAB;  Service: Cardiovascular;  Laterality: N/A;   CARDIOPULMONARY EXERCISE TEST  02/12/2012   Peak VO2 63% of predicted   FEMUR IM NAIL Left 05/10/2022   Procedure: INTRAMEDULLARY (IM) NAIL FEMORAL;  Surgeon: Edna Toribio DELENA, MD;  Location: [facility] ORS;  Service: Orthopedics;  Laterality: Left;   IR CATHETER TUBE CHANGE  04/03/2023   ORIF FEMUR FRACTURE Right    TOTAL HIP ARTHROPLASTY Left 08/05/2022   Procedure: TOTAL HIP ARTHROPLASTY;  Surgeon: Edna Toribio DELENA, MD;  Location: [facility] OR;  Service: Orthopedics;  Laterality: Left;   TRANSTHORACIC ECHOCARDIOGRAM  11/28/2011   EF >55%, normal      Medications: Prior to Admission medications  Medication Sig Start Date End Date Taking? Authorizing Provider  albuterol  (VENTOLIN  HFA) 108 (90 Base) MCG/ACT inhaler INHALE 2 PUFFS INTO THE LUNGS EVERY 6 HOURS AS NEEDED FOR WHEEZING OR SHORTNESS OF BREATH 02/19/23  Yes Mannam, Praveen, MD  Budeson-Glycopyrrol-Formoterol  (BREZTRI  AEROSPHERE) 160-9-4.8 MCG/ACT AERO Inhale 2 puffs into the lungs in the morning and at bedtime. 11/22/21  Yes Mannam, Praveen, MD  feeding supplement (ENSURE ENLIVE / ENSURE PLUS) LIQD Take 237 mLs by mouth 2 (two) times daily between meals. 05/13/22  Yes Dahal, Chapman, MD  levothyroxine  (SYNTHROID ) 112 MCG tablet  Take 112 mcg by mouth daily. 07/29/19  Yes [provider]  rOPINIRole  (REQUIP ) 1 MG tablet Take 1 mg by mouth at bedtime. 01/01/13  Yes [provider]  sertraline  (ZOLOFT ) 100 MG tablet Take 200 mg by mouth daily. 01/25/13  Yes [provider]  tamsulosin  (FLOMAX ) 0.4 MG CAPS capsule Take 0.4 mg by mouth in the morning and at bedtime. 01/31/13  Yes [provider]  zolpidem (AMBIEN CR) 6.25 MG CR tablet Take  6.25 mg by mouth at bedtime as needed. 08/09/23  Yes [provider]  amLODipine  (NORVASC ) 10 MG tablet Take 10 mg by mouth daily.    [provider]  amoxicillin -clavulanate (AUGMENTIN ) 875-125 MG tablet Take 1 tablet by mouth every 12 (twelve) hours. 12/25/23   Kommor, Madison, MD  buPROPion  (WELLBUTRIN  XL) 150 MG 24 hr tablet Take 150 mg by mouth every morning. 07/17/21   [provider]  diclofenac  Sodium (VOLTAREN ) 1 % GEL Apply 4 g topically 4 (four) times daily. 12/08/23   Floyd, Dan, DO  latanoprost  (XALATAN ) 0.005 % ophthalmic solution Place 1 drop into both eyes at bedtime. 08/17/21   [provider]  rosuvastatin  (CRESTOR ) 5 MG tablet Take 5 mg by mouth daily. 07/15/19   [provider]  senna-docusate (SENOKOT-S) 8.6-50 MG tablet Take 1 tablet by mouth at bedtime as needed for mild constipation. 05/13/22   Arlice Reichert, MD     Family History  Adopted: Yes    Social History   Socioeconomic History   Marital status: Married    Spouse name: Not on file   Number of children: Not on file   Years of education: Not on file   Highest education level: Not on file  Occupational History   Not on file  Tobacco Use   Smoking status: Former    Current packs/day: 0.00    Average packs/day: 2.0 packs/day for 50.9 years (101.9 ttl pk-yrs)    Types: Cigarettes    Start date: 04/11/1960    Quit date: 03/19/2011    Years since quitting: 13.2   Smokeless tobacco: Never   Tobacco comments:    Quit for 1 year total   Substance and Sexual Activity   Alcohol use: No    Alcohol/week: 0.0 standard drinks of alcohol   Drug use: No   Sexual activity: Not on file  Other Topics Concern   Not on file  Social History Narrative   ** Merged History Encounter **       [facility] (03/06/17): Originally from [place], [place]. Has always lived in [place]. Has traveled to [place], [place], [place], [place], [place], & [place]. Has a dog currently. No bird  exposure. No mold or hot tub exposure. Previously enjoyed riding horse   s and playing golf. Currently has no hobbies. 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    Social Drivers of Health   Tobacco Use: Medium Risk (05/05/2024)   Patient History    Smoking Tobacco Use: Former    Smokeless Tobacco Use: Never    Passive Exposure: Not on file  Financial Resource Strain: Not on file  Food Insecurity: No Food Insecurity (09/03/2023)   Hunger Vital Sign    Worried About Running Out of Food in the Last Year: Never true    Ran Out of Food in the Last Year: Never true  Transportation Needs: No Transportation Needs (09/03/2023)   PRAPARE - Transportation  Lack of Transportation (Medical): No    Lack of Transportation (Non-Medical): No  Physical Activity: Not on file  Stress: Not on file  Social Connections: Moderately Isolated (09/03/2023)   Social Connection and Isolation Panel    Frequency of Communication with Friends and Family: Three times a week    Frequency of Social Gatherings with Friends and Family: Twice a week    Attends Religious Services: Never    Database Administrator or Organizations: No    Attends Banker Meetings: Never    Marital Status: Married  Depression (PHQ2-9): Not on file  Alcohol Screen: Not on file  Housing: Low Risk (09/03/2023)   Housing Stability Vital Sign    Unable to Pay for Housing in the Last Year: No    Number of Times Moved in the Last Year: 0    Homeless in the Last Year: No  Utilities: Not At Risk (09/03/2023)   [facility] Utilities    Threatened with loss of utilities: No  Health Literacy: Not on file     Review of Systems: A 12 point ROS discussed and pertinent positives are indicated in the HPI above.  All other systems are negative.    Vital Signs: BP (!) 162/75   Pulse 64   Temp (!) 97.5 F (36.4 C) (Oral)   Resp 17   SpO2 98%   Advance Care Plan: no documents on file     Physical Exam HENT:     Mouth/Throat:     Mouth: Mucous membranes are moist.  Cardiovascular:     Rate and Rhythm: Normal rate and regular rhythm.  Pulmonary:     Effort: Pulmonary effort is normal. No respiratory distress.     Breath sounds: Normal breath sounds.  Abdominal:     General: Bowel sounds are normal.     Palpations: Abdomen is soft.     Tenderness: There is no abdominal tenderness.  Genitourinary:    Comments: Suprapubic catheter with leg bag in place, site unremarkable Skin:    General: Skin is warm and dry.  Neurological:     Mental Status: He is alert and oriented to person, place, and time.  Psychiatric:        Mood and Affect: Mood normal.        Behavior: Behavior normal.     Imaging: CT Head Wo Contrast Result Date: 05/05/2024 EXAM: CT HEAD WITHOUT CONTRAST 05/05/2024 10:07:01 AM TECHNIQUE: CT of the head was performed without the administration of intravenous contrast. Automated exposure control, iterative reconstruction, and/or weight based adjustment of the mA/kV was utilized to reduce the radiation dose to as low as reasonably achievable. COMPARISON: CT of the head dated 01/29/2022. CLINICAL HISTORY: Head trauma, moderate-severe. FINDINGS: BRAIN AND VENTRICLES: No acute hemorrhage. No evidence of acute infarct. No hydrocephalus. No extra-axial collection. No mass effect or midline shift. Generalized cerebral atrophy and mild chronic small vessel ischemic disease. Intracranial arterial calcification. ORBITS: No acute abnormality. SINUSES: No acute abnormality. SOFT TISSUES AND SKULL: Left vertex scalp laceration with staples in place. No skull fracture. IMPRESSION: 1. No acute intracranial abnormality. 2. Generalized cerebral atrophy and mild chronic small vessel ischemic disease. 3. Left vertex scalp laceration with staples in place. Electronically signed by: Evalene Coho MD 05/05/2024 10:38 AM EST RP Workstation: [id number]   CT Cervical Spine Wo  Contrast Result Date: 05/05/2024 CLINICAL DATA:  Neck trauma. Patient tripped and fell striking head. Scalp laceration. EXAM: CT CERVICAL SPINE WITHOUT CONTRAST TECHNIQUE: Multidetector CT imaging  of the cervical spine was performed without intravenous contrast. Multiplanar CT image reconstructions were also generated. RADIATION DOSE REDUCTION: This exam was performed according to the departmental dose-optimization program which includes automated exposure control, adjustment of the mA and/or kV according to patient size and/or use of iterative reconstruction technique. COMPARISON:  None Available. FINDINGS: Alignment: Minimal degenerative anterolisthesis at C3-4 and C4-5. No focal angulation. Skull base and vertebrae: No evidence of acute cervical spine fracture or traumatic subluxation. Soft tissues and spinal canal: No prevertebral fluid or swelling. No visible canal hematoma. Disc levels: Relatively mild multilevel spondylosis with disc bulging, uncinate spurring and facet hypertrophy. No large disc herniation or high-grade spinal stenosis demonstrated. Mild multilevel osseous foraminal narrowing. Upper chest: Emphysematous changes and mild scarring at both lung apices. No acute findings. Other: Bilateral carotid atherosclerosis. IMPRESSION: 1. No evidence of acute cervical spine fracture, traumatic subluxation or static signs of instability. 2. Mild cervical spondylosis. Electronically Signed   By: Elsie Perone M.D.   On: 05/05/2024 10:22    Labs:  CBC: Recent Labs    09/05/23 0557 09/06/23 0230 12/25/23 1516 05/04/24 1416  WBC 5.7 5.6 8.2 10.0  HGB 10.2* 10.4* 12.8* 14.7  HCT 30.2* 30.6* 38.0* 43.1  PLT 80* 94* 168 175    COAGS: Recent Labs    09/02/23 1614 05/27/24 1305  INR 1.4* 1.1    BMP: Recent Labs    09/04/23 0646 09/05/23 0557 12/25/23 1516 05/04/24 1416  NA 136 134* 140 143  K 3.6 4.1 3.6 4.3  CL 102 103 106 107  CO2 22 24 23 23   GLUCOSE 120* 96 109* 97  BUN  25* 27* 17 27*  CALCIUM  8.7* 8.4* 8.9 9.6  CREATININE 1.38* 1.26* 1.24 1.58*  GFRNONAA 51* 57* 58* 43*    LIVER FUNCTION TESTS: Recent Labs    09/02/23 1614 12/25/23 1516 05/04/24 1416  BILITOT 1.5* 1.2 0.8  AST 45* 21 23  ALT 28 12 10   ALKPHOS 70 134* 122  PROT 6.3* 6.5 7.4  ALBUMIN 3.2* 3.5 4.2    TUMOR MARKERS: No results for input(s): AFPTM, CEA, CA199, CHROMGRNA in the last 8760 hours.  Assessment and Plan:  Request for image guided suprapubic catheter tube exchange and upsize. No contraindications for procedure identified in ROS, physical exam, or review of pre-sedation considerations.  Labs reviewed and within acceptable range Imaging available and reviewed VSS, afebrile  Risks and benefits of suprapubic catheter placement were discussed with the patient including bleeding, infection, damage to adjacent structures, bladder perforation/fistula connection, and sepsis.  All of the patient's questions were answered, patient is agreeable to proceed. Consent signed and in chart.    Thank you for allowing our service to participate in VIKAS WEGMANN 's care.    Electronically Signed: Anahla Bevis B Ninah Moccio, NP   05/27/2024, 2:12 PM     I spent a total of 15 Minutes in face to face in clinical consultation, greater than 50% of which was counseling/coordinating care for image guided suprapubic catheter tube exchange and upsize.   (A copy of this note was sent to the referring provider and the time of visit.)  "

## 2024-05-27 NOTE — Procedures (Signed)
 Interventional Radiology Procedure:   Indications: Suprapubic tube needs up sizing  Procedure: Exchange and up size of SPT  Findings: New 16 Fr Council catheter placed along existing tract.     Complications: None     EBL: Minimal  Plan:  Patient can follow up with Urology for routine exchanges   Kayven Aldaco R. Philip, MD  Pager: [phone]

## 2024-06-10 NOTE — Progress Notes (Unsigned)
 " Cardiology Office Note:    Date:  06/11/2024   ID:  Timothy Spencer, DOB 12-Dec-1940, MRN [id number]  PCP:  Clarice Nottingham, MD  Cardiologist:  None  Electrophysiologist:  None   Referring MD: Clarice Nottingham, MD   Chief Complaint  Patient presents with   Coronary Artery Disease    History of Present Illness:    Timothy Spencer is a 84 y.o. male with a hx of CAD, tobacco use, COPD, hypertension, hyperlipidemia, hypothyroidism, OSA who presents for follow-up.  Previously followed with Dr. Burnard.  Underwent LHC 10/2014, which showed mid LAD myocardial bridge that narrowed to 80 to 90% during systole and 0% during diastole, also with 50% mid LCx stenosis.  Echocardiogram 09/04/2023 showed EF 60 to 65%, moderate asymmetric LVH, G1 DD, normal RV function, no significant valvular disease.  Since last clinic visit, he reports he is doing well. Denies any chest pain, lightheadedness, syncope, lower extremity edema, or palpitations.  Reports some dyspnea but has been stable. Reports will walk on treadmill for 10 minutes.   Past Medical History:  Diagnosis Date   Anxiety and depression    BPH (benign prostatic hyperplasia)    CAD (coronary artery disease)    COPD (chronic obstructive pulmonary disease) (HCC)    DOE (dyspnea on exertion)    ED (erectile dysfunction)    Emphysema of lung (HCC)    First degree AV block    GERD (gastroesophageal reflux disease)    Glaucoma    Gout    Gynecomastia    H/O ETOH abuse    Hemochromatosis    Hyperlipidemia    Hypertension    Hypothyroidism    Macular degeneration    OSA (obstructive sleep apnea)    Osteopenia    Peripheral neuropathy    RLS (restless legs syndrome)    Thrombocytopenia     Past Surgical History:  Procedure Laterality Date   CARDIAC CATHETERIZATION  03/27/2008   Medical therapy   CARDIAC CATHETERIZATION  06/03/2010   Medical theray and smoking cessation   CARDIAC CATHETERIZATION N/A 11/24/2014   Procedure: Left Heart Cath  and Coronary Angiography;  Surgeon: Debby DELENA Burnard, MD;  Location: [facility];  Service: Cardiovascular;  Laterality: N/A;   CARDIOPULMONARY EXERCISE TEST  02/12/2012   Peak VO2 63% of predicted   FEMUR IM NAIL Left 05/10/2022   Procedure: INTRAMEDULLARY (IM) NAIL FEMORAL;  Surgeon: Edna Toribio DELENA, MD;  Location: [facility];  Service: Orthopedics;  Laterality: Left;   IR CATHETER TUBE CHANGE  04/03/2023   IR CYSTOSTOMY TUBE PLACEMENT/BLADDER ASPIRATION  05/27/2024   ORIF FEMUR FRACTURE Right    TOTAL HIP ARTHROPLASTY Left 08/05/2022   Procedure: TOTAL HIP ARTHROPLASTY;  Surgeon: Edna Toribio DELENA, MD;  Location: [facility];  Service: Orthopedics;  Laterality: Left;   TRANSTHORACIC ECHOCARDIOGRAM  11/28/2011   EF >55%, normal    Current Medications: Active Medications[1]   Allergies:   Hydrochlorothiazide w-spironolactone   Social History   Socioeconomic History   Marital status: Married    Spouse name: Not on file   Number of children: Not on file   Years of education: Not on file   Highest education level: Not on file  Occupational History   Not on file  Tobacco Use   Smoking status: Former    Current packs/day: 0.00    Average packs/day: 2.0 packs/day for 50.9 years (101.9 ttl pk-yrs)    Types: Cigarettes    Start date: 04/11/1960  Quit date: 03/19/2011    Years since quitting: 13.2   Smokeless tobacco: Never   Tobacco comments:    Quit for 1 year total   Substance and Sexual Activity   Alcohol use: No    Alcohol/week: 0.0 standard drinks of alcohol   Drug use: No   Sexual activity: Not on file  Other Topics Concern   Not on file  Social History Narrative   ** Merged History Encounter **       [facility] (03/06/17): Originally from [place], [place]. Has always lived in [place]. Has traveled to [place], [place], [place], [place], [place], & [place]. Has a dog currently. No bird exposure. No mold or hot tub exposure. Previously enjoyed riding horse   s and playing  golf. Currently has no hobbies. 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    Social Drivers of Health   Tobacco Use: Medium Risk (06/11/2024)   Patient History    Smoking Tobacco Use: Former    Smokeless Tobacco Use: Never    Passive Exposure: Not on file  Financial Resource Strain: Not on file  Food Insecurity: No Food Insecurity (09/03/2023)   Hunger Vital Sign    Worried About Running Out of Food in the Last Year: Never true    Ran Out of Food in the Last Year: Never true  Transportation Needs: No Transportation Needs (09/03/2023)   PRAPARE - Administrator, Civil Service (Medical): No    Lack of Transportation (Non-Medical): No  Physical Activity: Not on file  Stress: Not on file  Social Connections: Moderately Isolated (09/03/2023)   Social Connection and Isolation Panel    Frequency of Communication with Friends and Family: Three times a week    Frequency of Social Gatherings with Friends and Family: Twice a week    Attends Religious Services: Never    Database Administrator or Organizations: No    Attends Banker Meetings: Never    Marital Status: Married  Depression (PHQ2-9): Not on file  Alcohol Screen: Not on file  Housing: Low Risk (09/03/2023)   Housing Stability Vital Sign    Unable to Pay for Housing in the Last Year: No    Number of Times Moved in the Last Year: 0    Homeless in the Last Year: No  Utilities: Not At Risk (09/03/2023)   [facility] Utilities    Threatened with loss of utilities: No  Health Literacy: Not on file     Family History: The patient's family history is not on file. He was adopted.  ROS:   Please see the history of present illness.     All other systems reviewed and are negative.  EKGs/Labs/Other Studies Reviewed:    The following studies were reviewed today:   EKG:  EKG is not ordered today.   Recent Labs: 09/05/2023: TSH 20.797 05/04/2024: ALT 10; BUN 27; Creatinine, Ser  1.58; Hemoglobin 14.7; Platelets 175; Potassium 4.3; Sodium 143  Recent Lipid Panel No results found for: CHOL, TRIG, HDL, CHOLHDL, VLDL, LDLCALC, LDLDIRECT  Physical Exam:    VS:  BP 114/72   Pulse 82   Ht 5' 11 (1.803 m)   Wt 139 lb 9.6 oz (63.3 kg)   SpO2 99%   BMI 19.47 kg/m     Wt Readings from Last 3 Encounters:  06/11/24 139 lb 9.6 oz (63.3 kg)  04/03/24 151 lb (68.5 kg)  12/25/23 150  lb (68 kg)     GEN:  Well nourished, well developed in no acute distress HEENT: Normal NECK: No JVD; No carotid bruits LYMPHATICS: No lymphadenopathy CARDIAC: RRR, no murmurs, rubs, gallops RESPIRATORY:  Clear to auscultation without rales, wheezing or rhonchi  ABDOMEN: Soft, non-tender, non-distended MUSCULOSKELETAL:  No edema; No deformity  SKIN: Warm and dry NEUROLOGIC:  Alert and oriented x 3 PSYCHIATRIC:  Normal affect   ASSESSMENT:    1. Coronary artery disease involving native coronary artery of native heart without angina pectoris   2. Myocardial bridge   3. Essential hypertension   4. Hyperlipidemia with target LDL less than 70    PLAN:    CAD: Underwent LHC 10/2014 showed mid LAD myocardial bridge that narrowed to 80 to 90% during systole and 0% during diastole, also with 50% mid LCx stenosis.  Echocardiogram 09/04/2023 showed EF 60 to 65%, moderate asymmetric LVH, G1DD, normal RV function, no significant valvular disease. - Continue rosuvastatin  5 mg daily - Continue amlodipine  10 mg daily  Hypertension: On amlodipine  10 mg daily.  Appears controlled  Hyperlipidemia: On rosuvastatin  5 mg daily.  LDL 67 on 03/17/24  RTC in 1 year    Medication Adjustments/Labs and Tests Ordered: Current medicines are reviewed at length with the patient today.  Concerns regarding medicines are outlined above.  No orders of the defined types were placed in this encounter.  No orders of the defined types were placed in this encounter.   Patient Instructions   Medication Instructions:  Your physician recommends that you continue on your current medications as directed. Please refer to the Current Medication list given to you today.  *If you need a refill on your cardiac medications before your next appointment, please call your pharmacy*  Lab Work: None  If you have labs (blood work) drawn today and your tests are completely normal, you will receive your results only by: MyChart Message (if you have MyChart) OR A paper copy in the mail If you have any lab test that is abnormal or we need to change your treatment, we will call you to review the results.  Testing/Procedures: none  Follow-Up: At [facility], you and your health needs are our priority.  As part of our continuing mission to provide you with exceptional heart care, our providers are all part of one team.  This team includes your primary Cardiologist (physician) and Advanced Practice Providers or APPs (Physician Assistants and Nurse Practitioners) who all work together to provide you with the care you need, when you need it.  Your next appointment:   1 year  Provider:   Dr. Kate  We recommend signing up for the patient portal called MyChart.  Sign up information is provided on this After Visit Summary.  MyChart is used to connect with patients for Virtual Visits (Telemedicine).  Patients are able to view lab/test results, encounter notes, upcoming appointments, etc.  Non-urgent messages can be sent to your provider as well.   To learn more about what you can do with MyChart, go to [email or web address].   Other Instructions none            Signed, Lonni LITTIE Kate, MD  06/11/2024 4:42 PM    [facility]     [1]  Current Meds  Medication Sig   albuterol  (VENTOLIN  HFA) 108 (90 Base) MCG/ACT inhaler INHALE 2 PUFFS INTO THE LUNGS EVERY 6 HOURS AS NEEDED FOR WHEEZING OR SHORTNESS OF BREATH   amLODipine  (  NORVASC ) 10 MG tablet  Take 10 mg by mouth daily.   amoxicillin -clavulanate (AUGMENTIN ) 875-125 MG tablet Take 1 tablet by mouth every 12 (twelve) hours.   Budeson-Glycopyrrol-Formoterol  (BREZTRI  AEROSPHERE) 160-9-4.8 MCG/ACT AERO Inhale 2 puffs into the lungs in the morning and at bedtime.   buPROPion  (WELLBUTRIN  XL) 150 MG 24 hr tablet Take 150 mg by mouth every morning.   diclofenac  Sodium (VOLTAREN ) 1 % GEL Apply 4 g topically 4 (four) times daily.   feeding supplement (ENSURE ENLIVE / ENSURE PLUS) LIQD Take 237 mLs by mouth 2 (two) times daily between meals.   latanoprost  (XALATAN ) 0.005 % ophthalmic solution Place 1 drop into both eyes at bedtime.   levothyroxine  (SYNTHROID ) 112 MCG tablet Take 112 mcg by mouth daily.   rOPINIRole  (REQUIP ) 1 MG tablet Take 1 mg by mouth at bedtime.   rosuvastatin  (CRESTOR ) 5 MG tablet Take 5 mg by mouth daily.   senna-docusate (SENOKOT-S) 8.6-50 MG tablet Take 1 tablet by mouth at bedtime as needed for mild constipation.   sertraline  (ZOLOFT ) 100 MG tablet Take 200 mg by mouth daily.   tamsulosin  (FLOMAX ) 0.4 MG CAPS capsule Take 0.4 mg by mouth in the morning and at bedtime.   zolpidem (AMBIEN CR) 6.25 MG CR tablet Take 6.25 mg by mouth at bedtime as needed.   "

## 2024-06-11 ENCOUNTER — Ambulatory Visit: Attending: Cardiology | Admitting: Cardiology

## 2024-06-11 ENCOUNTER — Encounter: Payer: Self-pay | Admitting: Cardiology

## 2024-06-11 VITALS — BP 114/72 | HR 82 | Ht 71.0 in | Wt 139.6 lb

## 2024-06-11 DIAGNOSIS — I2510 Atherosclerotic heart disease of native coronary artery without angina pectoris: Secondary | ICD-10-CM | POA: Diagnosis not present

## 2024-06-11 DIAGNOSIS — Q245 Malformation of coronary vessels: Secondary | ICD-10-CM | POA: Diagnosis not present

## 2024-06-11 DIAGNOSIS — I10 Essential (primary) hypertension: Secondary | ICD-10-CM

## 2024-06-11 DIAGNOSIS — E785 Hyperlipidemia, unspecified: Secondary | ICD-10-CM

## 2024-06-11 NOTE — Patient Instructions (Signed)
 Medication Instructions:  Your physician recommends that you continue on your current medications as directed. Please refer to the Current Medication list given to you today.  *If you need a refill on your cardiac medications before your next appointment, please call your pharmacy*  Lab Work: None If you have labs (blood work) drawn today and your tests are completely normal, you will receive your results only by: MyChart Message (if you have MyChart) OR A paper copy in the mail If you have any lab test that is abnormal or we need to change your treatment, we will call you to review the results.  Testing/Procedures: none  Follow-Up: At [facility], you and your health needs are our priority.  As part of our continuing mission to provide you with exceptional heart care, our providers are all part of one team.  This team includes your primary Cardiologist (physician) and Advanced Practice Providers or APPs (Physician Assistants and Nurse Practitioners) who all work together to provide you with the care you need, when you need it.  Your next appointment:   1 year  Provider:   Dr. Kate  We recommend signing up for the patient portal called MyChart.  Sign up information is provided on this After Visit Summary.  MyChart is used to connect with patients for Virtual Visits (Telemedicine).  Patients are able to view lab/test results, encounter notes, upcoming appointments, etc.  Non-urgent messages can be sent to your provider as well.   To learn more about what you can do with MyChart, go to [email or web address].   Other Instructions none
